# Patient Record
Sex: Male | Born: 1975 | Race: Black or African American | Hispanic: No | Marital: Single | Smoking: Former smoker
Health system: Southern US, Community
[De-identification: ages and names within clinical notes are randomized; demographics above are authoritative.]

## PROBLEM LIST (undated history)

## (undated) DIAGNOSIS — E119 Type 2 diabetes mellitus without complications: Secondary | ICD-10-CM

## (undated) DIAGNOSIS — I428 Other cardiomyopathies: Secondary | ICD-10-CM

## (undated) DIAGNOSIS — I5042 Chronic combined systolic (congestive) and diastolic (congestive) heart failure: Secondary | ICD-10-CM

## (undated) DIAGNOSIS — Z91148 Patient's other noncompliance with medication regimen for other reason: Secondary | ICD-10-CM

## (undated) DIAGNOSIS — I2510 Atherosclerotic heart disease of native coronary artery without angina pectoris: Secondary | ICD-10-CM

## (undated) DIAGNOSIS — I4892 Unspecified atrial flutter: Secondary | ICD-10-CM

## (undated) DIAGNOSIS — E669 Obesity, unspecified: Secondary | ICD-10-CM

## (undated) DIAGNOSIS — I10 Essential (primary) hypertension: Secondary | ICD-10-CM

## (undated) DIAGNOSIS — E785 Hyperlipidemia, unspecified: Secondary | ICD-10-CM

## (undated) DIAGNOSIS — G4733 Obstructive sleep apnea (adult) (pediatric): Secondary | ICD-10-CM

## (undated) HISTORY — DX: Type 2 diabetes mellitus without complications: E11.9

## (undated) HISTORY — DX: Obstructive sleep apnea (adult) (pediatric): G47.33

---

## 2020-04-07 ENCOUNTER — Encounter (HOSPITAL_COMMUNITY): Payer: Self-pay | Admitting: Emergency Medicine

## 2020-04-07 ENCOUNTER — Inpatient Hospital Stay (HOSPITAL_COMMUNITY)
Admission: EM | Admit: 2020-04-07 | Discharge: 2020-04-14 | DRG: 291 | Disposition: A | Discharge status: Home / Self Care | Payer: Self-pay | Attending: Internal Medicine | Admitting: Internal Medicine

## 2020-04-07 ENCOUNTER — Other Ambulatory Visit: Payer: Self-pay

## 2020-04-07 ENCOUNTER — Emergency Department (HOSPITAL_COMMUNITY): Payer: Self-pay

## 2020-04-07 DIAGNOSIS — F40240 Claustrophobia: Secondary | ICD-10-CM | POA: Diagnosis present

## 2020-04-07 DIAGNOSIS — I5041 Acute combined systolic (congestive) and diastolic (congestive) heart failure: Secondary | ICD-10-CM

## 2020-04-07 DIAGNOSIS — I110 Hypertensive heart disease with heart failure: Principal | ICD-10-CM | POA: Diagnosis present

## 2020-04-07 DIAGNOSIS — T502X5A Adverse effect of carbonic-anhydrase inhibitors, benzothiadiazides and other diuretics, initial encounter: Secondary | ICD-10-CM | POA: Diagnosis not present

## 2020-04-07 DIAGNOSIS — N179 Acute kidney failure, unspecified: Secondary | ICD-10-CM | POA: Diagnosis not present

## 2020-04-07 DIAGNOSIS — I10 Essential (primary) hypertension: Secondary | ICD-10-CM

## 2020-04-07 DIAGNOSIS — R0602 Shortness of breath: Secondary | ICD-10-CM | POA: Diagnosis present

## 2020-04-07 DIAGNOSIS — I493 Ventricular premature depolarization: Secondary | ICD-10-CM | POA: Diagnosis present

## 2020-04-07 DIAGNOSIS — Z6841 Body Mass Index (BMI) 40.0 and over, adult: Secondary | ICD-10-CM

## 2020-04-07 DIAGNOSIS — Z720 Tobacco use: Secondary | ICD-10-CM

## 2020-04-07 DIAGNOSIS — I509 Heart failure, unspecified: Secondary | ICD-10-CM

## 2020-04-07 DIAGNOSIS — J9622 Acute and chronic respiratory failure with hypercapnia: Secondary | ICD-10-CM | POA: Diagnosis present

## 2020-04-07 DIAGNOSIS — Z8616 Personal history of COVID-19: Secondary | ICD-10-CM

## 2020-04-07 DIAGNOSIS — J9601 Acute respiratory failure with hypoxia: Secondary | ICD-10-CM

## 2020-04-07 DIAGNOSIS — I4891 Unspecified atrial fibrillation: Secondary | ICD-10-CM | POA: Diagnosis present

## 2020-04-07 DIAGNOSIS — R600 Localized edema: Secondary | ICD-10-CM

## 2020-04-07 DIAGNOSIS — I4892 Unspecified atrial flutter: Secondary | ICD-10-CM

## 2020-04-07 DIAGNOSIS — F1290 Cannabis use, unspecified, uncomplicated: Secondary | ICD-10-CM

## 2020-04-07 DIAGNOSIS — Z20822 Contact with and (suspected) exposure to covid-19: Secondary | ICD-10-CM | POA: Diagnosis present

## 2020-04-07 DIAGNOSIS — I248 Other forms of acute ischemic heart disease: Secondary | ICD-10-CM | POA: Diagnosis present

## 2020-04-07 DIAGNOSIS — J9621 Acute and chronic respiratory failure with hypoxia: Secondary | ICD-10-CM | POA: Diagnosis present

## 2020-04-07 DIAGNOSIS — D751 Secondary polycythemia: Secondary | ICD-10-CM | POA: Diagnosis present

## 2020-04-07 DIAGNOSIS — I428 Other cardiomyopathies: Secondary | ICD-10-CM | POA: Diagnosis present

## 2020-04-07 DIAGNOSIS — I429 Cardiomyopathy, unspecified: Secondary | ICD-10-CM

## 2020-04-07 DIAGNOSIS — Z8249 Family history of ischemic heart disease and other diseases of the circulatory system: Secondary | ICD-10-CM

## 2020-04-07 DIAGNOSIS — E118 Type 2 diabetes mellitus with unspecified complications: Secondary | ICD-10-CM

## 2020-04-07 DIAGNOSIS — I5043 Acute on chronic combined systolic (congestive) and diastolic (congestive) heart failure: Secondary | ICD-10-CM | POA: Diagnosis present

## 2020-04-07 DIAGNOSIS — R601 Generalized edema: Secondary | ICD-10-CM

## 2020-04-07 DIAGNOSIS — R0600 Dyspnea, unspecified: Secondary | ICD-10-CM | POA: Diagnosis present

## 2020-04-07 DIAGNOSIS — G4733 Obstructive sleep apnea (adult) (pediatric): Secondary | ICD-10-CM | POA: Diagnosis present

## 2020-04-07 DIAGNOSIS — F17210 Nicotine dependence, cigarettes, uncomplicated: Secondary | ICD-10-CM

## 2020-04-07 HISTORY — DX: Essential (primary) hypertension: I10

## 2020-04-07 LAB — BASIC METABOLIC PANEL WITH GFR
Anion gap: 10 (ref 5–15)
BUN: 15 mg/dL (ref 6–20)
CO2: 28 mmol/L (ref 22–32)
Calcium: 9 mg/dL (ref 8.9–10.3)
Chloride: 102 mmol/L (ref 98–111)
Creatinine, Ser: 1.27 mg/dL — ABNORMAL HIGH (ref 0.61–1.24)
GFR, Estimated: >60 mL/min
Glucose, Bld: 129 mg/dL — ABNORMAL HIGH (ref 70–99)
Potassium: 4.2 mmol/L (ref 3.5–5.1)
Sodium: 140 mmol/L (ref 135–145)

## 2020-04-07 LAB — LIPID PANEL
Cholesterol: 139 mg/dL (ref 0–200)
HDL: 35 mg/dL — ABNORMAL LOW (ref >40)
LDL Cholesterol: 93 mg/dL (ref 0–99)
Total CHOL/HDL Ratio: 4 RATIO
Triglycerides: 57 mg/dL (ref <150)
VLDL: 11 mg/dL (ref 0–40)

## 2020-04-07 LAB — CBC
HCT: 53.1 % — ABNORMAL HIGH (ref 39.0–52.0)
Hemoglobin: 16 g/dL (ref 13.0–17.0)
MCH: 27.7 pg (ref 26.0–34.0)
MCHC: 30.1 g/dL (ref 30.0–36.0)
MCV: 92 fL (ref 80.0–100.0)
Platelets: 156 10*3/uL (ref 150–400)
RBC: 5.77 MIL/uL (ref 4.22–5.81)
RDW: 18.6 % — ABNORMAL HIGH (ref 11.5–15.5)
WBC: 4.1 10*3/uL (ref 4.0–10.5)
nRBC: 0 % (ref 0.0–0.2)

## 2020-04-07 LAB — TROPONIN I (HIGH SENSITIVITY)
Troponin I (High Sensitivity): 55 ng/L — ABNORMAL HIGH
Troponin I (High Sensitivity): 60 ng/L — ABNORMAL HIGH
Troponin I (High Sensitivity): 60 ng/L — ABNORMAL HIGH (ref <18)

## 2020-04-07 LAB — HEPATIC FUNCTION PANEL
ALT: 41 U/L (ref 0–44)
AST: 23 U/L (ref 15–41)
Albumin: 3.2 g/dL — ABNORMAL LOW (ref 3.5–5.0)
Alkaline Phosphatase: 81 U/L (ref 38–126)
Bilirubin, Direct: 0.2 mg/dL (ref 0.0–0.2)
Indirect Bilirubin: 0.6 mg/dL (ref 0.3–0.9)
Total Bilirubin: 0.8 mg/dL (ref 0.3–1.2)
Total Protein: 6.3 g/dL — ABNORMAL LOW (ref 6.5–8.1)

## 2020-04-07 LAB — HIV ANTIBODY (ROUTINE TESTING W REFLEX): HIV Screen 4th Generation wRfx: NONREACTIVE

## 2020-04-07 LAB — POC SARS CORONAVIRUS 2 AG -  ED: SARS Coronavirus 2 Ag: NEGATIVE

## 2020-04-07 LAB — BRAIN NATRIURETIC PEPTIDE: B Natriuretic Peptide: 878.9 pg/mL — ABNORMAL HIGH (ref 0.0–100.0)

## 2020-04-07 LAB — HEMOGLOBIN A1C
Hgb A1c MFr Bld: 6.7 % — ABNORMAL HIGH (ref 4.8–5.6)
Mean Plasma Glucose: 145.59 mg/dL

## 2020-04-07 LAB — TSH: TSH: 2.779 u[IU]/mL (ref 0.350–4.500)

## 2020-04-07 LAB — MAGNESIUM: Magnesium: 1.7 mg/dL (ref 1.7–2.4)

## 2020-04-07 MED ORDER — FUROSEMIDE 10 MG/ML IJ SOLN
20.0000 mg | Freq: Once | INTRAMUSCULAR | Status: AC
  Administered 2020-04-07: 20 mg via INTRAVENOUS
  Filled 2020-04-07: qty 2

## 2020-04-07 MED ORDER — MAGNESIUM SULFATE 50 % IJ SOLN: 1.0000 g | Freq: Once | INTRAMUSCULAR | Status: DC

## 2020-04-07 MED ORDER — NICOTINE 14 MG/24HR TD PT24
14.0000 mg | MEDICATED_PATCH | Freq: Every day | TRANSDERMAL | Status: DC
  Administered 2020-04-07 – 2020-04-14 (×8): 14 mg via TRANSDERMAL
  Filled 2020-04-07 (×8): qty 1

## 2020-04-07 MED ORDER — MAGNESIUM SULFATE IN D5W 1-5 GM/100ML-% IV SOLN
1.0000 g | Freq: Once | INTRAVENOUS | Status: AC
  Administered 2020-04-07: 1 g via INTRAVENOUS
  Filled 2020-04-07: qty 100

## 2020-04-07 MED ORDER — POLYETHYLENE GLYCOL 3350 17 G PO PACK
17.0000 g | PACK | Freq: Every day | ORAL | Status: DC | PRN
  Filled 2020-04-07: qty 1

## 2020-04-07 MED ORDER — ACETAMINOPHEN 650 MG RE SUPP: 650.0000 mg | Freq: Four times a day (QID) | RECTAL | Status: DC | PRN

## 2020-04-07 MED ORDER — ENOXAPARIN SODIUM 60 MG/0.6ML ~~LOC~~ SOLN
55.0000 mg | SUBCUTANEOUS | Status: DC
  Administered 2020-04-07 – 2020-04-10 (×4): 55 mg via SUBCUTANEOUS
  Filled 2020-04-07: qty 0.55
  Filled 2020-04-07 (×3): qty 0.6
  Filled 2020-04-07: qty 0.55

## 2020-04-07 MED ORDER — ACETAMINOPHEN 325 MG PO TABS
650.0000 mg | ORAL_TABLET | Freq: Four times a day (QID) | ORAL | Status: DC | PRN
  Administered 2020-04-11 – 2020-04-12 (×2): 650 mg via ORAL
  Filled 2020-04-07 (×2): qty 2

## 2020-04-07 NOTE — ED Notes (Signed)
Once pt was roomed in 15 his sats were between 88-93%. Pt was put on 2 lpm via  with RN approval and PA was notified.

## 2020-04-07 NOTE — ED Triage Notes (Signed)
Pt reports SOB and lower extremity edema x 2 days.  Denies pain.

## 2020-04-07 NOTE — ED Notes (Signed)
Lab stated only blood sample were received after 5 this afternoon, noted initial protocol lab orders in process at this time.

## 2020-04-07 NOTE — H&P (Signed)
Date: 04/07/2020               Patient Name:  Gregory Mckenzie MRN: [id number]  DOB: 04-Jun-1975 Age / Sex: 45 y.o., male   PCP: Pcp, No         Medical Service: Internal Medicine Teaching Service         Attending Physician: Dr. Reymundo Poll    First Contact: Dr. Evlyn Kanner Pager: [phone]  Second Contact: Dr. Elige Radon Pager: [phone]       After Hours (After 5p/  First Contact Pager: [phone]  weekends / holidays): Second Contact Pager: [phone]   Chief Complaint: Shortness of Breath   History of Present Illness:   Gregory Mckenzie is a 45 year old man with no known past medical history because he has not seen a doctor in recent years who presents to the ED with shortness of breath. He reports he was in his usual state of health until two days ago when he noticed that he his shoes were fitting tighter than usual, and the swelling extended up to his groin within a day. Reports for many years he has experienced nighttime awakenings with accompanying shortness of breath, daytime sleepiness, and frequent daytime naps. Sleeps on one pillow but often falls asleep in his recliner while playing video games. Denies headaches, chest pain, dizziness, lightheadedness, palpitations, weakness, abdominal pain, dysuria, nausea, vomiting, diarrhea, polyuria, polydipsia, dry skin, recent fevers or chills. Reports since receiving Lasix in the ED prior to our evaluation he has noticed slight improvement in his breathing and lower extremity swelling. He reports two months ago he noticed shortness of breath when walking the short distance from his house to his car. Assuming it was related to his weight, he has since made changes to improve his diet including boiling instead of frying foods and cutting back on salt, soda, and sweet tea. Notes that last month he weighed himself on a home scale and was 250 pounds. He has not seen a doctor as an adult and does not take any medications aside from the occasional  acetaminophen for pain. Vaccinated and boosted against COVID.  ED Course: Afebrile, intermittently tachypneic with RR 16-27, P 90s-102, hypertensive with BP 140s-170s/80s-110s, placed on 2 L  for SpO2 in low 90s with improvement to >94%. CBC unremarkable. BMP notable for Na 140, K 4.2 (slight hemolysis), glucose 129, BUN/Cr 15/1.27 (unknown baseline). Troponin 55->60. BNP elevated at 878.9. EKG with tachycardia and occasional PVCs. CXR with cardiomegaly with central vascular prominence and bibasilar interstitial opacities c/w pulmonary edema. Given 20 mg IV Lasix. IMTS consulted for admission.   Meds:  Currently does not take any medications.  Allergies: Allergies as of 04/07/2020  . (No Known Allergies)   Family History: Mother and father are living; mother has hypertension. Brother passed away at 36 years old from a blood clot and heart failure. No known family history of diabetes or cancer.   Social History: Lives with his fiance in [place]. 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 Has been smoking 0.5 ppd for the last 13 years (6.5 pack-year smoking history). Daily marijuana use. Occasional social alcohol drinking. No cocaine or heroin use. Diet mainly consists of burgers, chicken, "a lot of friend foods," sweet drinks.   PMH: Had COVID-19 last year (2021).   Review of Systems: A complete ROS was negative except as per HPI.   Physical Exam: Blood pressure (!) 170/94, pulse 97, temperature 97.9 F (36.6 C), temperature source Oral, resp.  rate (!) 22, height 5\' 4"  (1.626 m), weight 113.4 kg, SpO2 94 %. Constitutional: very pleasant, obese, and tired-appearing man sitting up in ED stretcher, in no acute distress HENT: normocephalic atraumatic, mucous membranes moist, macroglossia Eyes: conjunctiva slightly erythematous, no discharge Neck: supple, increased neck circumference Cardiovascular: tachycardic, regular rhythm, no m/r/g; unable to assess JVD secondary to body  habitus; 2-3+ pitting edema of the lower extremities bilaterally up to the knees Pulmonary/Chest: increased work of breathing on 3L [place] with SpO2 >92%, lungs with crackles bilaterally to the mid lung fields, no wheezes or rhonchi; upper airway sounds Abdominal: mildly tense, non-tender, mildly distended MSK: normal bulk and tone Neurological: alert & oriented x 3, moves all extremities equally, sensation grossly intact Skin: warm and dry Psych: normal mood and affect  EKG: personally reviewed my interpretation is tachycardia with right atrial enlargement and occasional PVCs.  CXR: personally reviewed my interpretation is cardiomegaly with central vascular prominence and bibasilar interstitial opacities.  Assessment & Plan by Problem: Active Problems:   Shortness of breath  Gregory Mckenzie is a 45 year old man with daily tobacco use and no known past medical history because he has not seen a doctor in recent years who presents to the ED with shortness of breath and lower extremity edema and admitted for further evaluation and management of suspected new onset heart failure.  Acute hypoxic respiratory failure secondary to likely New Onset Heart Failure Elevated blood pressure Presents with acute-onset of lower extremity edema associated with dyspnea and new oxygen requirement (3 L). Mildly tachypneic and tachycardic. Hypertensive with BP 140s-170s/80s-110s. Exam notable for bilateral lower extremity pitting edema, bilateral crackles to the mid-lung fields, no wheezing. BNP 878.9. Troponin 55->60. CXR consistent with pulmonary edema. Presentation concerning for new-onset heart failure with associated demand ischemia. Risk factors include possible sleep apnea (see below), possible long-standing untreated hypertension, family history. Will obtain echocardiogram, diurese, evaluate for additional risk factors and treat accordingly. - s/p 20 mg IV Lasix, additional 20 mg IV Lasix tonight for total of 40 mg IV   - Echocardiogram - Hgb A1c - TSH - Lipid Panel  - Trend troponin - Magnesium  - Strict I/O, daily weights - Wean oxygen as tolerated - Monitor electrolytes, replete K>4 and Mg>2 - Trend BMP - Telemetry  - HH diet with 1.5L fluid restriction  Possible Sleep Apnea:  Patient with increased waist circumference, BMI >40, daily somnolence with frequent unintentional napping, and elevated blood pressure. Patient's partner reports patient occasionally gasping at night. High suspicion for sleep apnea. - Outpatient polysomnography  Daily Tobacco Use Current half-pack per day smoker with 6.5 pack-year smoking history. - Nicotine patch 14mg  - Smoking cessation counseling  Diet: Heart Healthy with 1500 L fluid restriction VTE: Enoxaparin IVF: None Code: Full  Prior to Admission Living Arrangement: Home, living with his fiance Anticipated Discharge Location: Home Barriers to Discharge: further evaluation and management of suspected new-onset heart failure  Dispo: Admit patient to Inpatient with expected length of stay greater than 2 midnights.   Signed: 47, MD PGY-1 Internal Medicine Teaching Service Pager: [phone] 04/07/2020 After 5pm on weekdays and 1pm on weekends: On Call pager: [phone]

## 2020-04-07 NOTE — ED Provider Notes (Signed)
[facility] EMERGENCY DEPARTMENT Provider Note   CSN: [id number] Arrival date & time: 04/07/20  1136     History Chief Complaint  Patient presents with   Shortness of Breath    Gregory Mckenzie is a 45 y.o. male who presents for evaluation of shortness of breath, lower extremity edema that began yesterday.  He states that he started noticing it in his feet.  He states that it would hurt him to put on his normal shoes and so he would have to wear flip-flops.  He states that yesterday, he started noticing that it began spreading up his legs.  He states that by the end of yesterday, he was swollen from his legs up to his scrotum.  He states that this is never happened to him before and is new.  He reports that he is also had some shortness of breath.  He states that at baseline, he has some minor shortness of breath but states that he feels like it is gotten slightly worse over the last few days.  No cough, chest pain.  He sleeps in a chair normally so he does not know of the change.  He states that he has not had any fevers, cough, abdominal pain, nausea/vomiting.  He smokes cigarettes and reports that a pack will last about 2 days.  He also endorses occasional marijuana use.  No cocaine use.  He states he has never seen a primary care doctor and does not take any medications on a daily basis.  The history is provided by the patient.       History reviewed. No pertinent past medical history.  There are no problems to display for this patient.   History reviewed. No pertinent surgical history.     No family history on file.  Social History   Tobacco Use   Smoking status: Current Every Day Smoker   Smokeless tobacco: Never Used  Substance Use Topics   Alcohol use: Not Currently   Drug use: Not Currently    Home Medications Prior to Admission medications   Not on File    Allergies    Patient has no known allergies.  Review of Systems   Review of Systems   Constitutional: Negative for fever.  Respiratory: Positive for shortness of breath. Negative for cough.   Cardiovascular: Positive for leg swelling. Negative for chest pain.  Gastrointestinal: Negative for abdominal pain, nausea and vomiting.  Genitourinary: Positive for scrotal swelling. Negative for dysuria and hematuria.  Neurological: Negative for headaches.  All other systems reviewed and are negative.   Physical Exam Updated Vital Signs BP (!) 150/69    Pulse 99    Temp 97.9 F (36.6 C) (Oral)    Resp 15    Ht 5\' 4"  (1.626 m)    Wt 113.4 kg    SpO2 98%    BMI 42.91 kg/m   Physical Exam Vitals and nursing note reviewed.  Constitutional:      Appearance: Normal appearance. He is well-developed and well-nourished.  HENT:     Head: Normocephalic and atraumatic.     Mouth/Throat:     Mouth: Oropharynx is clear and moist and mucous membranes are normal.  Eyes:     General: Lids are normal.     Extraocular Movements: EOM normal.     Conjunctiva/sclera: Conjunctivae normal.     Pupils: Pupils are equal, round, and reactive to light.  Cardiovascular:     Rate and Rhythm: Normal rate and regular  rhythm.     Pulses: Normal pulses.     Heart sounds: Normal heart sounds. No murmur heard. No friction rub. No gallop.   Pulmonary:     Effort: Pulmonary effort is normal. Tachypnea present.     Breath sounds: Rales present.     Comments: Mild tachypnea noted. Rales noted at bilateral bases. Able to speak in full sentences.  Abdominal:     Palpations: Abdomen is soft. Abdomen is not rigid.     Tenderness: There is no abdominal tenderness. There is no guarding.  Genitourinary:    Comments: The exam was performed with a chaperone present Arlyss Repress, Tech). Normal male genitalia. No evidence of rash, ulcers or lesions.  Diffuse scrotal edema.  No overlying warmth, erythema.  No tenderness noted. Musculoskeletal:        General: Normal range of motion.     Cervical back: Full passive range  of motion without pain.     Comments: 2+ pitting edema noted from his feet that extends up to the distal thigh.  1+ pitting edema noted from the thigh up to the scrotum.  Skin:    General: Skin is warm and dry.     Capillary Refill: Capillary refill takes less than 2 seconds.  Neurological:     Mental Status: He is alert and oriented to person, place, and time.  Psychiatric:        Mood and Affect: Mood and affect normal.        Speech: Speech normal.     ED Results / Procedures / Treatments   Labs (all labs ordered are listed, but only abnormal results are displayed) Labs Reviewed  BASIC METABOLIC PANEL - Abnormal; Notable for the following components:      Result Value   Glucose, Bld 129 (*)    Creatinine, Ser 1.27 (*)    All other components within normal limits  CBC - Abnormal; Notable for the following components:   HCT 53.1 (*)    RDW 18.6 (*)    All other components within normal limits  BRAIN NATRIURETIC PEPTIDE - Abnormal; Notable for the following components:   B Natriuretic Peptide 878.9 (*)    All other components within normal limits  TROPONIN I (HIGH SENSITIVITY) - Abnormal; Notable for the following components:   Troponin I (High Sensitivity) 55 (*)    All other components within normal limits  POC SARS CORONAVIRUS 2 AG -  ED  TROPONIN I (HIGH SENSITIVITY)  TROPONIN I (HIGH SENSITIVITY)    EKG EKG Interpretation  Date/Time:  Tuesday April 07 2020 11:45:12 EST Ventricular Rate:  102 PR Interval:  186 QRS Duration: 90 QT Interval:  348 QTC Calculation: 453 R Axis:   104 Text Interpretation: Sinus tachycardia with occasional Premature ventricular complexes Right atrial enlargement Lateral infarct , age undetermined Abnormal ECG No previous tracing Confirmed by Tilden Fossa [phone]) on 04/07/2020 4:37:13 PM   Radiology DG Chest 2 View  Result Date: 04/07/2020 CLINICAL DATA:  Chest pain and shortness of breath EXAM: CHEST - 2 VIEW COMPARISON:  None.  FINDINGS: Cardiomegaly with cardiomegaly with central vascular prominence. Bibasilar interstitial opacities. No pleural effusion or pneumothorax. The visualized skeletal structures are unremarkable. IMPRESSION: Cardiomegaly with central vascular prominence and bibasilar interstitial opacities, likely pulmonary edema. Electronically Signed   By: Maudry Mayhew MD   On: 04/07/2020 12:55    Procedures Procedures   Medications Ordered in ED Medications  furosemide (LASIX) injection 20 mg (has no administration in time range)  ED Course  I have reviewed the triage vital signs and the nursing notes.  Pertinent labs & imaging results that were available during my care of the patient were reviewed by me and considered in my medical decision making (see chart for details).    MDM Rules/Calculators/A&P                          45 year old male who presents for evaluation of bilateral lower extremity edema, shortness of breath.  States he noticed it yesterday and he feels like it is gotten worse.  He states he always has some shortness of breath but feels like it is gotten slightly worse.  No fevers, cough, chest pain.  He reports to me that he has never seen a primary care doctor.  On initial ED arrival, he is afebrile, appears slightly uncomfortable.  He is hypertensive and tachypneic.  Additionally, when he was placed back here in the room, his O2 sat read 88%.  He required 2 L of oxygen for him back up to about 94.  On exam, he has edema noted to bilateral lower extremities up to his scrotum consistent with fluid overload.  Additionally, some mild rales noted in the bases.  Concern for new onset heart failure.  He is hypertensive here.  I have no records of any medications from previous records of his blood pressure.  Question if this is new onset heart failure from uncontrolled hypertension.  Labs, chest x-ray ordered at triage.  CXR shows cardiomegaly with central vascular prominence and bibasilar  interstitial opacities, likely pulmonary edema.   Covid is negative.  His initial troponin is 55.  BNP is elevated at 878.9.  CBC stable.  BMP shows BUN of 15, creatinine 1.27.  I suspect troponin is elevated from demand ischemia from new onset heart failure.  He has been hypertensive throughout his entire ED stay here.  I suspect probably that his blood pressure has been uncontrolled for a while and that this is caused an acute heart failure.  At this time, he has an oxygen requirement of 2 L as well as new onset heart failure that needs echo and further work-up.  Patient given Lasix.  Discussed with patient he is agreeable for admission.  Discussed with internal medicine who accepts patient for admission.   Portions of this note were generated with Scientist, clinical (histocompatibility and immunogenetics). Dictation errors may occur despite best attempts at proofreading.   Final Clinical Impression(s) / ED Diagnoses Final diagnoses:  Hypertension, unspecified type  Bilateral lower extremity edema  Shortness of breath    Rx / DC Orders ED Discharge Orders    None       Rosana Hoes 04/07/20 Cristopher Estimable, MD 04/07/20 2225

## 2020-04-08 ENCOUNTER — Encounter (HOSPITAL_COMMUNITY): Payer: Self-pay | Admitting: Internal Medicine

## 2020-04-08 ENCOUNTER — Inpatient Hospital Stay (HOSPITAL_COMMUNITY): Payer: Self-pay

## 2020-04-08 DIAGNOSIS — I5041 Acute combined systolic (congestive) and diastolic (congestive) heart failure: Secondary | ICD-10-CM

## 2020-04-08 DIAGNOSIS — R0600 Dyspnea, unspecified: Secondary | ICD-10-CM

## 2020-04-08 DIAGNOSIS — I4892 Unspecified atrial flutter: Secondary | ICD-10-CM

## 2020-04-08 DIAGNOSIS — R601 Generalized edema: Secondary | ICD-10-CM

## 2020-04-08 DIAGNOSIS — J9601 Acute respiratory failure with hypoxia: Secondary | ICD-10-CM

## 2020-04-08 LAB — URINALYSIS, ROUTINE W REFLEX MICROSCOPIC
Bacteria, UA: NONE SEEN
Bilirubin Urine: NEGATIVE
Glucose, UA: NEGATIVE mg/dL
Ketones, ur: NEGATIVE mg/dL
Leukocytes,Ua: NEGATIVE
Nitrite: NEGATIVE
Protein, ur: NEGATIVE mg/dL
Specific Gravity, Urine: 1.005 (ref 1.005–1.030)
pH: 7 (ref 5.0–8.0)

## 2020-04-08 LAB — ECHOCARDIOGRAM COMPLETE
Area-P 1/2: 14.31 cm2
Height: 64 in
S' Lateral: 4.5 cm
Weight: 4000 oz

## 2020-04-08 LAB — BASIC METABOLIC PANEL
Anion gap: 11 (ref 5–15)
BUN: 12 mg/dL (ref 6–20)
CO2: 32 mmol/L (ref 22–32)
Calcium: 9.3 mg/dL (ref 8.9–10.3)
Chloride: 98 mmol/L (ref 98–111)
Creatinine, Ser: 1.34 mg/dL — ABNORMAL HIGH (ref 0.61–1.24)
GFR, Estimated: >60 mL/min (ref >60)
Glucose, Bld: 117 mg/dL — ABNORMAL HIGH (ref 70–99)
Potassium: 4.2 mmol/L (ref 3.5–5.1)
Sodium: 141 mmol/L (ref 135–145)

## 2020-04-08 LAB — GLUCOSE, CAPILLARY
Glucose-Capillary: 131 mg/dL — ABNORMAL HIGH (ref 70–99)
Glucose-Capillary: 128 mg/dL — ABNORMAL HIGH (ref 70–99)

## 2020-04-08 LAB — CBG MONITORING, ED: Glucose-Capillary: 119 mg/dL — ABNORMAL HIGH (ref 70–99)

## 2020-04-08 LAB — MAGNESIUM: Magnesium: 2.4 mg/dL (ref 1.7–2.4)

## 2020-04-08 MED ORDER — FUROSEMIDE 10 MG/ML IJ SOLN
40.0000 mg | Freq: Once | INTRAMUSCULAR | Status: AC
  Administered 2020-04-08: 40 mg via INTRAVENOUS
  Filled 2020-04-08: qty 4

## 2020-04-08 MED ORDER — INSULIN ASPART 100 UNIT/ML ~~LOC~~ SOLN
0.0000 [IU] | Freq: Three times a day (TID) | SUBCUTANEOUS | Status: DC
  Administered 2020-04-08 – 2020-04-09 (×3): 2 [IU] via SUBCUTANEOUS
  Administered 2020-04-10: 5 [IU] via SUBCUTANEOUS
  Administered 2020-04-10 – 2020-04-11 (×2): 3 [IU] via SUBCUTANEOUS
  Administered 2020-04-11: 2 [IU] via SUBCUTANEOUS
  Administered 2020-04-12: 3 [IU] via SUBCUTANEOUS
  Administered 2020-04-12 – 2020-04-13 (×3): 2 [IU] via SUBCUTANEOUS
  Administered 2020-04-13: 3 [IU] via SUBCUTANEOUS
  Administered 2020-04-13 – 2020-04-14 (×3): 2 [IU] via SUBCUTANEOUS

## 2020-04-08 MED ORDER — CARVEDILOL 6.25 MG PO TABS
6.2500 mg | ORAL_TABLET | Freq: Two times a day (BID) | ORAL | Status: DC
  Administered 2020-04-08 – 2020-04-09 (×2): 6.25 mg via ORAL
  Filled 2020-04-08 (×2): qty 1

## 2020-04-08 MED ORDER — ATORVASTATIN CALCIUM 40 MG PO TABS
40.0000 mg | ORAL_TABLET | Freq: Every day | ORAL | Status: DC
  Administered 2020-04-08 – 2020-04-13 (×6): 40 mg via ORAL
  Filled 2020-04-08 (×6): qty 1

## 2020-04-08 MED ORDER — BUMETANIDE 0.25 MG/ML IJ SOLN
1.0000 mg | Freq: Two times a day (BID) | INTRAMUSCULAR | Status: DC
  Administered 2020-04-09 – 2020-04-11 (×5): 1 mg via INTRAVENOUS
  Filled 2020-04-08 (×9): qty 4

## 2020-04-08 NOTE — ED Notes (Signed)
Pt increased to 4L due to hypoxia  

## 2020-04-08 NOTE — ED Notes (Signed)
Tele Breakfast Order Placed 

## 2020-04-08 NOTE — ED Notes (Signed)
Attempted report X1

## 2020-04-08 NOTE — Progress Notes (Signed)
  Echocardiogram 2D Echocardiogram has been performed.  Pieter Partridge 04/08/2020, 11:30 AM

## 2020-04-08 NOTE — Discharge Summary (Incomplete)
   Name: Lawton Dollinger MRN: [id number] DOB: [birth year] 45 y.o. PCP: Pcp, No  Date of Admission: 04/07/2020 11:40 AM Date of Discharge: Patient is not medically stable for discharge Attending Physician: Reymundo Poll, MD  Discharge Diagnosis: 1. ***  Discharge Medications: Allergies as of 04/08/2020   No Known Allergies   Med Rec must be completed prior to using this [facility]***       Disposition and follow-up:   Mr.Kevin Schnitzler was discharged from [facility] in {DISCHARGE CONDITION:19696} condition.  At the hospital follow up visit please address:  1.  ***  2.  Labs / imaging needed at time of follow-up: ***  3.  Pending labs/ test needing follow-up: ***  Follow-up Appointments:   Hospital Course by problem list: 1. ***  Discharge Exam:   BP (!) 141/99   Pulse 86   Temp 97.9 F (36.6 C) (Oral)   Resp (!) 29   Ht 5\' 4"  (1.626 m)   Wt 113.4 kg   SpO2 90%   BMI 42.91 kg/m  Discharge exam: ***  Pertinent Labs, Studies, and Procedures:  ***  Discharge Instructions:   Signed: , MD 04/08/2020, 7:58 AM   Pager: [phone]

## 2020-04-08 NOTE — Progress Notes (Signed)
Subjective:  Patient evaluated at bedside this AM. Patient reports continued dyspnea, says he has never needed oxygen prior to arrival to ED. Does state he has urinated frequently through the night and feels like his leg swelling has decreased. Of note, reports some abdominal discomfort/cramping after urination, which has only started since lasix was administered. Denies chest pain, palpitations.  Objective:  Vital signs in last 24 hours: Vitals:   04/08/20 0600 04/08/20 0630 04/08/20 0645 04/08/20 0700  BP: (!) 142/99 (!) 118/91  (!) 141/99  Pulse: 98 84 (!) 105 96  Resp:   (!) 36 (!) 24  Temp:      TempSrc:      SpO2:   91%   Weight:      Height:       Physical Exam: General: Obese, sitting up on side of bed, no acute distress CV: Tachycardic, regular rhythm. No m/r/g appreciated. Pulm: Mild bibasilar rales present. No wheezing or rhonchi appreciated. On 4L [place] Abdomen: Soft, non-tender, non-distended MSK: Normal bulk, tone. 2+ pitting edema to knees bilaterally.  CBC Latest Ref Rng & Units 04/07/2020  WBC 4.0 - 10.5 K/uL 4.1  Hemoglobin 13.0 - 17.0 g/dL 46.5  Hematocrit 68.1 - 52.0 % 53.1(H)  Platelets 150 - 400 K/uL 156   BMP Latest Ref Rng & Units 04/08/2020 04/07/2020  Glucose 70 - 99 mg/dL 275(T) 700(F)  BUN 6 - 20 mg/dL 12 15  Creatinine 7.49 - 1.24 mg/dL 4.49(Q) 7.59(F)  Sodium 135 - 145 mmol/L 141 140  Potassium 3.5 - 5.1 mmol/L 4.2 4.2  Chloride 98 - 111 mmol/L 98 102  CO2 22 - 32 mmol/L 32 28  Calcium 8.9 - 10.3 mg/dL 9.3 9.0   Assessment/Plan: [name] is 44yo person with no known medical history admitted 2/22 for acute hypoxic respiratory failure most likely 2/2 acute decompensated heart failure. Patient mildly improved with diuresis, will continue today and obtain imaging for further characterization of symptoms.  Active Problems:   Shortness of breath  #Acute hypoxic respiratory failure most likely 2/2 acute decompensated HF No acute changes  overnight. Diuresing well after IV lasix 40mg  yesterday, recorded UOP 1.9L. Patient's symptoms most consistent with acute decompensated heart failure. He has not previously seen a physician and does not take any medications at home. Likely heart failure d/t long-standing hypertension with also concerns for OSA. Occasional alcohol use. Denies chest pain at rest or with exertion. Troponin normalized overnight, 55>60>60. ECG w/o ischemic changes. Will obtain Echo today, likely will need cardiology consult for ischemic evaluation, especially if reduced EF. Lipid panel unremarkable, TSH normal. - IV lasix 40mg  today - F/u TTE - Daily BMP, Mg - Goal: K >4, Mg >2 - Strict I&O's, daily weights - Outpatient polysomnography  #High blood pressure SBP's since admission 130-160's. Will hold off starting ARB/ACE and BB with elevated sCr (unknown baseline) and acute decompensated HF. Will obtain urine studies today. Plan to follow-up Echo today and begin antihypertensive therapy as indicated. - F/u UA, UMicroalb/UCr - F/u TTE  #Type II diabetes mellitus No previous diagnosis of diabetes. A1c 6.7 on admission, not on any medications. Will have SSI while inpatient, could benefit from SGLT2 inhibitor on discharge. - SSI - CBG monitoring w/ meals  #Tobacco use disorder Daily smoker, will need to counsel for smoking cessation. Patient has multiple cardiovascular risks, including smoking, diabetes, hypertension, obesity, family history.  - Nicotine patch - F/u smoking cessation  DIET: HH IVF:  n/a DVT PPX: Lovenox BOWEL:  Miralax CODE: FULL FAM COM: n/a  Prior to Admission Living Arrangement: Home Anticipated Discharge Location: Home Barriers to Discharge: medical management Dispo: Anticipated discharge in approximately 1-3 day(s).   Evlyn Kanner, MD 04/08/2020, 7:19 AM Pager: [phone] After 5pm on weekdays and 1pm on weekends: On Call pager [phone]

## 2020-04-08 NOTE — Consult Note (Signed)
CARDIOLOGY CONSULT NOTE  Patient ID: Gregory Mckenzie MRN: [id number] DOB/AGE: 07/26/1975 45 y.o.  Admit date: 04/07/2020 Attending physician: Reymundo Poll, MD Primary Physician:  Pcp, No Outpatient Cardiologist: None Inpatient Cardiologist: Tessa Lerner, DO, [facility]  Chief complaint: Shortness of breath and lower extremity swelling  Reason for consultation: New onset of cardiomyopathy Referring physician: Reymundo Poll, MD  HPI:  Gregory Mckenzie is a 45 y.o. African-American male who presents with a chief complaint of " shortness of breath and lower extremity swelling." His past medical history and cardiovascular risk factors include: HTN (not on medication), smoking, obesity, marijuana use, newly diagnosed diabetes mellitus type 2, newly diagnosed congestive heart failure with reduced EF.  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  Patient states that he started noticing that his socks and shoes were tighter than usual, his lower extremity swelling was progressive up to the level of his thighs and also involving the scrotum and therefore was concerned and come to the hospital for further evaluation and management.  He was admitted and being managed by internal medicine service and had an echocardiogram which noted reduced LVEF and therefore cardiology is consulted for further recommendations.  Patient states that he has a history of hypertension but currently not on medical therapy.  He does not have a primary care provider and does not recall the last time he seen a physician.  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  In addition he has gained a significant amount of weight due to dietary indiscretion as he has been consuming fried foods, high salt calorie diet, soda,  and sweet tea.  Patient stated that in the recent past he has experienced shortness of breath with effort related activities but he was attributing that to his underlying obesity.  At the time of the evaluation patient states that his shortness of breath is improving but not resolved.  Similar his lower extremity swelling has improved with IV diuretics but not back to baseline.  He does not have any chest pain at rest or with effort related activities.  No family history of premature coronary artery disease or familial history of cardiomyopathy.  Patient denies any history of viral illnesses.  He has been vaccinated and received his booster for COVID-19.  ALLERGIES: No Known Allergies  PAST MEDICAL HISTORY: Past Medical History:  Diagnosis Date  . Hypertension   Newly diagnosed systolic and diastolic heart failure Newly diagnosed diabetes mellitus  PAST SURGICAL HISTORY: None, per patient  FAMILY HISTORY: No family history of premature coronary artery disease or familial history of cardiomyopathy   SOCIAL HISTORY:  The patient  reports that he has been smoking. He has never used smokeless tobacco. He reports previous alcohol use. He reports previous drug use.  Marijuana use, last used 04/06/2020  MEDICATIONS: No current outpatient medications  REVIEW OF SYSTEMS: Review of Systems  Constitutional: Negative for chills and fever.  HENT: Negative for hoarse voice and nosebleeds.   Eyes: Negative for discharge, double vision and pain.  Cardiovascular: Positive for dyspnea on exertion and leg swelling. Negative for chest pain, claudication, near-syncope, orthopnea, palpitations, paroxysmal nocturnal dyspnea and syncope.  Respiratory: Positive for shortness of breath. Negative for hemoptysis.   Musculoskeletal: Negative for muscle cramps and myalgias.  Gastrointestinal:  Negative for abdominal pain, constipation, diarrhea, hematemesis, hematochezia, melena, nausea and vomiting.   Neurological: Negative for dizziness and light-headedness.  All other systems reviewed and are negative.   PHYSICAL EXAM: Vitals with BMI 04/08/2020 04/08/2020 04/08/2020  Height - - -  Weight - - -  BMI - - -  Systolic 144 141 -  Diastolic 89 102 -  Pulse 100 107 107     Intake/Output Summary (Last 24 hours) at 04/08/2020 2112 Last data filed at 04/08/2020 2054 Gross per 24 hour  Intake --  Output 4320 ml  Net -4320 ml    Net IO Since Admission: -4,320 mL [04/08/20 2112]  CONSTITUTIONAL: Appears older than stated age, hemodynamically stable, obese, no acute distress.  SKIN: Skin is warm and dry. No rash noted. No cyanosis. No pallor. No jaundice HEAD: Normocephalic and atraumatic.  EYES: No scleral icterus MOUTH/THROAT: Moist oral membranes.  NECK: No JVD present. No thyromegaly noted. No carotid bruits  LYMPHATIC: No visible cervical adenopathy.  CHEST Normal respiratory effort. No intercostal retractions  LUNGS: Decreased breath sounds bilaterally with faint crackles noted.  No stridor. No wheezes. CARDIOVASCULAR: Tachycardic, positive S1-S2, no murmurs rubs or gallops appreciated secondary to tachycardia. ABDOMINAL: Obese, distended, nontender, positive bowel sounds in all 4 quadrants, no apparent ascites.  EXTREMITIES: +2 bilateral peripheral edema  HEMATOLOGIC: No significant bruising NEUROLOGIC: Oriented to person, place, and time. Nonfocal. Normal muscle tone.  PSYCHIATRIC: Normal mood and affect. Normal behavior. Cooperative  RADIOLOGY: DG Chest 2 View  Result Date: 04/07/2020 CLINICAL DATA:  Chest pain and shortness of breath EXAM: CHEST - 2 VIEW COMPARISON:  None. FINDINGS: Cardiomegaly with cardiomegaly with central vascular prominence. Bibasilar interstitial opacities. No pleural effusion or pneumothorax. The visualized skeletal structures are unremarkable. IMPRESSION: Cardiomegaly with central vascular prominence and bibasilar interstitial opacities, likely  pulmonary edema. Electronically Signed   By: Maudry Mayhew MD   On: 04/07/2020 12:55   ECHOCARDIOGRAM COMPLETE  Result Date: 04/08/2020    ECHOCARDIOGRAM REPORT   Patient Name:   SAMYAK SACKMANN Date of Exam: 04/08/2020 Medical Rec #:  [id number]   Height:       64.0 in Accession #:    [id number]  Weight:       250.0 lb Date of Birth:  27-Feb-1975   BSA:          2.151 m Patient Age:    44 years    BP:           145/90 mmHg Patient Gender: M           HR:           103 bpm. Exam Location:  Inpatient Procedure: 2D Echo, Cardiac Doppler and Color Doppler Indications:    Dyspnea  History:        Patient has no prior history of Echocardiogram examinations.                 Signs/Symptoms:Dyspnea and LE edema; Risk Factors:Current Smoker                 and Obesity. Resp. failure.  Sonographer:    Lavenia Atlas Referring Phys: [phone] ELIZABETH REES IMPRESSIONS  1. Left ventricular ejection fraction, by estimation, is 40 to 45%. The left ventricle has mildly decreased function. The left ventricle demonstrates global hypokinesis. There is severe concentric left ventricular hypertrophy. Left ventricular diastolic  parameters are consistent with Grade II diastolic dysfunction (pseudonormalization). Elevated left atrial pressure.  2. Right ventricular systolic function was  not well visualized. The right ventricular size is mildly enlarged. There is normal pulmonary artery systolic pressure.  3. Left atrial size was severely dilated.  4. Right atrial size was severely dilated.  5. The mitral valve is normal in structure. Trivial mitral valve regurgitation. No evidence of mitral stenosis.  6. The aortic valve is normal in structure. Aortic valve regurgitation is not visualized. No aortic stenosis is present.  7. The inferior vena cava is dilated in size with >50% respiratory variability, suggesting right atrial pressure of 8 mmHg. FINDINGS  Left Ventricle: Left ventricular ejection fraction, by estimation, is 40 to 45%. The left  ventricle has mildly decreased function. The left ventricle demonstrates global hypokinesis. The left ventricular internal cavity size was normal in size. There is  severe concentric left ventricular hypertrophy. Left ventricular diastolic parameters are consistent with Grade II diastolic dysfunction (pseudonormalization). Elevated left atrial pressure. Right Ventricle: The right ventricular size is mildly enlarged. No increase in right ventricular wall thickness. Right ventricular systolic function was not well visualized. There is normal pulmonary artery systolic pressure. The tricuspid regurgitant velocity is 2.16 m/s, and with an assumed right atrial pressure of 8 mmHg, the estimated right ventricular systolic pressure is 26.7 mmHg. Left Atrium: Left atrial size was severely dilated. Right Atrium: Right atrial size was severely dilated. Pericardium: There is no evidence of pericardial effusion. Mitral Valve: The mitral valve is normal in structure. Trivial mitral valve regurgitation. No evidence of mitral valve stenosis. Tricuspid Valve: The tricuspid valve is normal in structure. Tricuspid valve regurgitation is trivial. No evidence of tricuspid stenosis. Aortic Valve: The aortic valve is normal in structure. Aortic valve regurgitation is not visualized. No aortic stenosis is present. Pulmonic Valve: The pulmonic valve was normal in structure. Pulmonic valve regurgitation is not visualized. No evidence of pulmonic stenosis. Aorta: The aortic root is normal in size and structure. Venous: The inferior vena cava is dilated in size with greater than 50% respiratory variability, suggesting right atrial pressure of 8 mmHg. IAS/Shunts: No atrial level shunt detected by color flow Doppler.  LEFT VENTRICLE PLAX 2D LVIDd:         5.40 cm  Diastology LVIDs:         4.50 cm  LV e' medial:    6.20 cm/s LV PW:         1.70 cm  LV E/e' medial:  13.5 LV IVS:        1.80 cm  LV e' lateral:   7.18 cm/s LVOT diam:     2.10 cm  LV  E/e' lateral: 11.7 LV SV:         45 LV SV Index:   21 LVOT Area:     3.46 cm  RIGHT VENTRICLE RV Basal diam:  4.40 cm RV S prime:     10.80 cm/s TAPSE (M-mode): 3.0 cm LEFT ATRIUM              Index       RIGHT ATRIUM           Index LA diam:        4.80 cm  2.23 cm/m  RA Area:     28.20 cm LA Vol (A2C):   116.0 ml 53.93 ml/m RA Volume:   105.00 ml 48.81 ml/m LA Vol (A4C):   111.0 ml 51.60 ml/m LA Biplane Vol: 114.0 ml 53.00 ml/m  AORTIC VALVE LVOT Vmax:   92.00 cm/s LVOT Vmean:  65.900 cm/s LVOT VTI:  0.131 m  AORTA Ao Root diam: 2.90 cm MITRAL VALVE                TRICUSPID VALVE MV Area (PHT): 14.31 cm    TR Peak grad:   18.7 mmHg MV Decel Time: 53 msec      TR Vmax:        216.00 cm/s MV E velocity: 84.00 cm/s MV A velocity: 101.00 cm/s  SHUNTS MV E/A ratio:  0.83         Systemic VTI:  0.13 m                             Systemic Diam: 2.10 cm Armanda Magicraci Turner MD Electronically signed by Armanda Magicraci Turner MD Signature Date/Time: 04/08/2020/12:49:42 PM    Final     LABORATORY DATA: Lab Results  Component Value Date   WBC 4.1 04/07/2020   HGB 16.0 04/07/2020   HCT 53.1 (H) 04/07/2020   MCV 92.0 04/07/2020   PLT 156 04/07/2020    Recent Labs  Lab 04/07/20 2041 04/08/20 0338  NA  --  141  K  --  4.2  CL  --  98  CO2  --  32  BUN  --  12  CREATININE  --  1.34*  CALCIUM  --  9.3  PROT 6.3*  --   BILITOT 0.8  --   ALKPHOS 81  --   ALT 41  --   AST 23  --   GLUCOSE  --  117*    Lipid Panel     Component Value Date/Time   CHOL 139 04/07/2020 2041   TRIG 57 04/07/2020 2041   HDL 35 (L) 04/07/2020 2041   CHOLHDL 4.0 04/07/2020 2041   VLDL 11 04/07/2020 2041   LDLCALC 93 04/07/2020 2041    BNP (last 3 results) Recent Labs    04/07/20 1701  BNP 878.9*    HEMOGLOBIN A1C Lab Results  Component Value Date   HGBA1C 6.7 (H) 04/07/2020   MPG 145.59 04/07/2020    Cardiac Panel (last 3 results) Cardiac Panel (last 3 results) Recent Labs    04/07/20 1701 04/07/20 1904  04/07/20 2041  TROPONINIHS 55* 60* 60*   TSH Recent Labs    04/07/20 2041  TSH 2.779      CARDIAC DATABASE: EKG: 04/07/2020: Sinus tachycardia, 102 bpm, right atrial enlargement, poor R wave progression, consider old lateral infarct, without underlying injury pattern, occasional ventricular ectopic beats.  04/08/2020: Sinus tachycardia, 104 bpm, right axis deviation, right atrial enlargement, poor R wave progression, without underlying ischemia or injury pattern.  Echocardiogram: 04/08/2020: 1. Left ventricular ejection fraction, by estimation, is 40 to 45%. The  left ventricle has mildly decreased function. The left ventricle  demonstrates global hypokinesis. There is severe concentric left  ventricular hypertrophy. Left ventricular diastolic  parameters are consistent with Grade II diastolic dysfunction  (pseudonormalization). Elevated left atrial pressure.  2. Right ventricular systolic function was not well visualized. The right  ventricular size is mildly enlarged. There is normal pulmonary artery  systolic pressure.  3. Left atrial size was severely dilated.  4. Right atrial size was severely dilated.  5. The mitral valve is normal in structure. Trivial mitral valve  regurgitation. No evidence of mitral stenosis.  6. The aortic valve is normal in structure. Aortic valve regurgitation is  not visualized. No aortic stenosis is present.  7. The inferior vena cava is dilated  in size with >50% respiratory  variability, suggesting right atrial pressure of 8 mmHg.  IMPRESSION & RECOMMENDATIONS: Dameion Briles is a 45 y.o. African-American male whose past medical history and cardiovascular risk factors include: HTN (not on medication), smoking, obesity, marijuana use, newly diagnosed diabetes mellitus type 2, newly diagnosed congestive heart failure with reduced EF.  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 Marijuana use Obesity due to excess calories  Plan: Cardiology is consulted for newly diagnosed cardiomyopathy/management of congestive heart failure.  Based on the echocardiogram results patient has both systolic and diastolic heart failure.  I suspect is most likely secondary to uncontrolled hypertension that has not been treated for unknown duration.  Echocardiographic notes severe left ventricular hypertrophy with global hypokinesis without mention of regional wall motion abnormalities.  Recommend initiation of guideline directed medical therapy.  Of note, patient states that he does not have  medical insurance and therefore prescription coverage as well as follow-up will be difficult.  I have requested primary team to help facilitate a social work consult to improve his access and prescription coverage once discharged.  Recommend carvedilol 6.25 mg p.o. twice daily.   Bumex 1 mg IV push twice daily with close monitoring of kidney function.  He does have renal impairment no prior serum creatinine levels for comparison. Strict I's and O's and daily weights. Low-salt diet Echocardiogram results reviewed Recommend compression stockings in bilateral lower extremities to help mobilize the swelling. If patient is able to get coverage once discharge would recommend initiation of Entresto and Farxiga prior to discharge or in a stepwise fashion.   Otherwise, would recommend losartan and Aldactone prior to discharge.  Patient understands that he needs to have follow-up as outpatient so the medications can further be uptitrated and lab work can be followed as heart failure medications can affect  renal function and electrolytes such as potassium levels which if uncorrected can cause morbidity mortality.  He verbalizes understanding  Given the new diagnosis of cardiomyopathy we discussed ischemic evaluation prior to discharge.  Ideally patient would be a good candidate for coronary CTA if his ventricular rate is well controlled.  However, patient states that he is claustrophobic to the CT scanner and does not want to proceed with coronary CTA.  Alternatives discussed were stress testing or left heart catheterization.  Pros and cons of each modality discussed with the patient he will like to rethink this prior to making his decision.  We will await his decision.  Newly diagnosed diabetes mellitus: Management per primary team.  Recommend Lipitor 40 mg p.o. nightly.  Educated on the importance of complete smoking and marijuana cessation.  Continue care regarding his chronic comorbid conditions.  We will follow patient with you during his hospitalization.  Thank you for allowing [place] to participate in the care of Mr. Ndiaye.   Total encounter time 88 minutes. *Total Encounter Time as defined by the Centers for Medicare and Medicaid Services includes, in addition to the face-to-face time of a patient visit (documented in the note above) non-face-to-face time: obtaining and reviewing outside history, ordering and reviewing medications, tests or procedures, care coordination (communications with other health care professionals or caregivers) and documentation in the medical record.  Patient's questions  and concerns were addressed to his satisfaction. He voices understanding of the instructions provided during this encounter.   This note was created using a voice recognition software as a result there may be grammatical errors inadvertently enclosed that do not reflect the nature of this encounter. Every attempt is made to correct such errors.  Delilah Shan [facility]  Pager: [phone] Office:  [phone] 04/08/2020, 9:12 PM

## 2020-04-09 ENCOUNTER — Encounter (HOSPITAL_COMMUNITY): Payer: Self-pay | Admitting: Internal Medicine

## 2020-04-09 DIAGNOSIS — F17210 Nicotine dependence, cigarettes, uncomplicated: Secondary | ICD-10-CM

## 2020-04-09 DIAGNOSIS — R600 Localized edema: Secondary | ICD-10-CM

## 2020-04-09 DIAGNOSIS — F1290 Cannabis use, unspecified, uncomplicated: Secondary | ICD-10-CM

## 2020-04-09 DIAGNOSIS — E118 Type 2 diabetes mellitus with unspecified complications: Secondary | ICD-10-CM

## 2020-04-09 DIAGNOSIS — I5041 Acute combined systolic (congestive) and diastolic (congestive) heart failure: Secondary | ICD-10-CM

## 2020-04-09 DIAGNOSIS — I10 Essential (primary) hypertension: Secondary | ICD-10-CM

## 2020-04-09 LAB — CBC
HCT: 56.7 % — ABNORMAL HIGH (ref 39.0–52.0)
Hemoglobin: 17.5 g/dL — ABNORMAL HIGH (ref 13.0–17.0)
MCH: 27.8 pg (ref 26.0–34.0)
MCHC: 30.9 g/dL (ref 30.0–36.0)
MCV: 90.1 fL (ref 80.0–100.0)
Platelets: 149 10*3/uL — ABNORMAL LOW (ref 150–400)
RBC: 6.29 MIL/uL — ABNORMAL HIGH (ref 4.22–5.81)
RDW: 18.3 % — ABNORMAL HIGH (ref 11.5–15.5)
WBC: 5.1 10*3/uL (ref 4.0–10.5)
nRBC: 0 % (ref 0.0–0.2)

## 2020-04-09 LAB — MAGNESIUM: Magnesium: 1.8 mg/dL (ref 1.7–2.4)

## 2020-04-09 LAB — BRAIN NATRIURETIC PEPTIDE: B Natriuretic Peptide: 1576.7 pg/mL — ABNORMAL HIGH (ref 0.0–100.0)

## 2020-04-09 LAB — BASIC METABOLIC PANEL
Anion gap: 9 (ref 5–15)
BUN: 13 mg/dL (ref 6–20)
CO2: 35 mmol/L — ABNORMAL HIGH (ref 22–32)
Calcium: 9 mg/dL (ref 8.9–10.3)
Chloride: 96 mmol/L — ABNORMAL LOW (ref 98–111)
Creatinine, Ser: 1.29 mg/dL — ABNORMAL HIGH (ref 0.61–1.24)
GFR, Estimated: >60 mL/min (ref >60)
Glucose, Bld: 137 mg/dL — ABNORMAL HIGH (ref 70–99)
Potassium: 3.8 mmol/L (ref 3.5–5.1)
Sodium: 140 mmol/L (ref 135–145)

## 2020-04-09 LAB — GLUCOSE, CAPILLARY
Glucose-Capillary: 133 mg/dL — ABNORMAL HIGH (ref 70–99)
Glucose-Capillary: 131 mg/dL — ABNORMAL HIGH (ref 70–99)
Glucose-Capillary: 118 mg/dL — ABNORMAL HIGH (ref 70–99)
Glucose-Capillary: 158 mg/dL — ABNORMAL HIGH (ref 70–99)

## 2020-04-09 LAB — MICROALBUMIN / CREATININE URINE RATIO
Creatinine, Urine: 21.4 mg/dL
Microalb Creat Ratio: 518 mg/g{creat} — ABNORMAL HIGH (ref 0–29)
Microalb, Ur: 110.8 ug/mL — ABNORMAL HIGH

## 2020-04-09 MED ORDER — POTASSIUM CHLORIDE 20 MEQ PO PACK
40.0000 meq | PACK | Freq: Once | ORAL | Status: AC
  Administered 2020-04-09: 40 meq via ORAL
  Filled 2020-04-09: qty 2

## 2020-04-09 MED ORDER — METOLAZONE 2.5 MG PO TABS
2.5000 mg | ORAL_TABLET | Freq: Every day | ORAL | Status: DC
Start: 2020-04-09 — End: 2020-04-11
  Administered 2020-04-09 – 2020-04-10 (×2): 2.5 mg via ORAL
  Filled 2020-04-09 (×2): qty 1

## 2020-04-09 MED ORDER — CARVEDILOL 12.5 MG PO TABS
12.5000 mg | ORAL_TABLET | Freq: Two times a day (BID) | ORAL | Status: DC
  Administered 2020-04-09 – 2020-04-14 (×10): 12.5 mg via ORAL
  Filled 2020-04-09 (×10): qty 1

## 2020-04-09 MED ORDER — MAGNESIUM SULFATE 2 GM/50ML IV SOLN
2.0000 g | Freq: Once | INTRAVENOUS | Status: AC
  Administered 2020-04-09: 2 g via INTRAVENOUS
  Filled 2020-04-09: qty 50

## 2020-04-09 MED ORDER — POTASSIUM CHLORIDE 20 MEQ PO PACK: 40.0000 meq | PACK | Freq: Two times a day (BID) | ORAL | Status: DC

## 2020-04-09 NOTE — Progress Notes (Signed)
Per tele patient HR dropped to 37. Went to room to assess patient, patient was asymptomatic. S/w Dr. Marijo Conception on phone to update.

## 2020-04-09 NOTE — Progress Notes (Signed)
Heart Failure Nurse Navigator Progress Note  PCP: Pcp, No PCP-Cardiologist: [place] (new) Admission Diagnosis: New CHF Admitted from: home with spouse  Presentation:   Gregory Mckenzie presented with BLE edema & SOB. Hasn't been to a Dr. In the past.   ECHO/ LVEF: 40-45%, G2DD  Clinical Course:  Past Medical History:  Diagnosis Date  . Hypertension     Social History   Socioeconomic History  . Marital status: Single    Spouse name: Not on file  . Number of children: Not on file  . Years of education: Not on file  . Highest education level: Not on file  Occupational History  . Not on file  Tobacco Use  . Smoking status: Current Every Day Smoker    Packs/day: 1.00    Years: 27.00    Pack years: 27.00    Types: Cigarettes  . Smokeless tobacco: Never Used  Vaping Use  . Vaping Use: Never used  Substance and Sexual Activity  . Alcohol use: Not Currently  . Drug use: Yes    Frequency: 7.0 times per week    Types: Marijuana    Comment: daily  . Sexual activity: Yes    Partners: Female    Birth control/protection: None  Other Topics Concern  . Not on file  Social History Narrative  . Not on file   Social Determinants of Health   Financial Resource Strain: Low Risk   . Difficulty of Paying Living Expenses: Not very hard  Food Insecurity: No Food Insecurity  . Worried About Programme researcher, broadcasting/film/video in the Last Year: Never true  . Ran Out of Food in the Last Year: Never true  Transportation Needs: No Transportation Needs  . Lack of Transportation (Medical): No  . Lack of Transportation (Non-Medical): No  Physical Activity: Inactive  . Days of Exercise per Week: 0 days  . Minutes of Exercise per Session: 0 min  Stress: Not on file  Social Connections: Not on file    High Risk Criteria for Readmission and/or Poor Patient Outcomes:  Heart failure hospital admissions (last 6 months): 1   No Show rate: N/A  Difficult social situation: yes- no insurance  Demonstrates  medication adherence: N/A  Primary Language: English  Literacy level: able to read/write and comprehend  Barriers of Care:   -no insurance -medication regimen -smoking -education -decrease marijuana use  Considerations/Referrals:   Referral made to Heart Failure Pharmacist Stewardship: yes, appreciated Referral made to Heart & Vascular TOC clinic: will follow and place appt closer to DC.  Items for Follow-up on DC/TOC: -insurance -medication knowledge -med optimization -smoking cessation  Ozella Rocks, RN, BSN Heart Failure Nurse Navigator [phone]

## 2020-04-09 NOTE — Progress Notes (Signed)
Progress Note  Patient Name: Gregory Mckenzie Date of Encounter: 04/09/2020  Attending physician: Reymundo Poll, MD Primary care provider: Pcp, No Primary Cardiologist: NA Consultant:Kasem Mozer Odis Hollingshead, DO  Subjective: Gregory Mckenzie is a 45 y.o. male who was seen and examined at bedside at approximately 843am Continues to have shortness of breath and lower extremity swelling. No chest pain No events overnight.  Objective: Vital Signs in the last 24 hours: Temp:  [98 F (36.7 C)-98.9 F (37.2 C)] 98.9 F (37.2 C) (02/24 0758) Pulse Rate:  [96-100] 96 (02/24 0758) Resp:  [16-22] 16 (02/24 0758) BP: (138-148)/(73-89) 138/73 (02/24 0758) SpO2:  [90 %-98 %] 98 % (02/24 0758) Weight:  [124.2 kg] 124.2 kg (02/24 0755)  Intake/Output:  Intake/Output Summary (Last 24 hours) at 04/09/2020 1311 Last data filed at 04/09/2020 1045 Gross per 24 hour  Intake 0 ml  Output 1295 ml  Net -1295 ml    Net IO Since Admission: -4,995 mL [04/09/20 1311]  Weights:  Filed Weights   04/07/20 1839 04/09/20 0755  Weight: 113.4 kg 124.2 kg    Telemetry: Personally reviewed.  Physical examination: PHYSICAL EXAM: Vitals with BMI 04/09/2020 04/09/2020 04/09/2020  Height - - -  Weight - 273 lbs 14 oz -  BMI - 46.99 -  Systolic 138 - 148  Diastolic 73 - 77  Pulse 96 - 99    CONSTITUTIONAL: Appears older than stated age, hemodynamically stable, obese, no acute distress.  SKIN: Skin is warm and dry. No rash noted. No cyanosis. No pallor. No jaundice HEAD: Normocephalic and atraumatic.  EYES: No scleral icterus MOUTH/THROAT: Moist oral membranes.  NECK: No JVD present. No thyromegaly noted. No carotid bruits  LYMPHATIC: No visible cervical adenopathy.  CHEST Normal respiratory effort. No intercostal retractions  LUNGS: Decreased breath sounds bilaterally with faint crackles noted.  No stridor. No wheezes. CARDIOVASCULAR: Regular,, positive S1-S2, no murmurs rubs or gallops appreciated secondary to  tachycardia. ABDOMINAL: Obese, distended, nontender, positive bowel sounds in all 4 quadrants, no apparent ascites.  EXTREMITIES: +2 bilateral peripheral edema  HEMATOLOGIC: No significant bruising NEUROLOGIC: Oriented to person, place, and time. Nonfocal. Normal muscle tone.  PSYCHIATRIC: Normal mood and affect. Normal behavior. Cooperative   Lab Results: Hematology Recent Labs  Lab 04/07/20 1701 04/09/20 0128  WBC 4.1 5.1  RBC 5.77 6.29*  HGB 16.0 17.5*  HCT 53.1* 56.7*  MCV 92.0 90.1  MCH 27.7 27.8  MCHC 30.1 30.9  RDW 18.6* 18.3*  PLT 156 149*    Chemistry Recent Labs  Lab 04/07/20 1701 04/07/20 2041 04/08/20 0338 04/09/20 0128  NA 140  --  141 140  K 4.2  --  4.2 3.8  CL 102  --  98 96*  CO2 28  --  32 35*  GLUCOSE 129*  --  117* 137*  BUN 15  --  12 13  CREATININE 1.27*  --  1.34* 1.29*  CALCIUM 9.0  --  9.3 9.0  PROT  --  6.3*  --   --   ALBUMIN  --  3.2*  --   --   AST  --  23  --   --   ALT  --  41  --   --   ALKPHOS  --  81  --   --   BILITOT  --  0.8  --   --   GFRNONAA >60  --  >60 >60  ANIONGAP 10  --  11 9     Cardiac Enzymes: Cardiac  Panel (last 3 results) Recent Labs    04/07/20 1701 04/07/20 1904 04/07/20 2041  TROPONINIHS 55* 60* 60*    BNP (last 3 results) Recent Labs    04/07/20 1701 04/09/20 0128  BNP 878.9* 1,576.7*    ProBNP (last 3 results) No results for input(s): PROBNP in the last 8760 hours.   DDimer No results for input(s): DDIMER in the last 168 hours.   Hemoglobin A1c:  Lab Results  Component Value Date   HGBA1C 6.7 (H) 04/07/2020   MPG 145.59 04/07/2020    TSH  Recent Labs    04/07/20 2041  TSH 2.779    Lipid Panel     Component Value Date/Time   CHOL 139 04/07/2020 2041   TRIG 57 04/07/2020 2041   HDL 35 (L) 04/07/2020 2041   CHOLHDL 4.0 04/07/2020 2041   VLDL 11 04/07/2020 2041   LDLCALC 93 04/07/2020 2041    Imaging: ECHOCARDIOGRAM COMPLETE  Result Date: 04/08/2020     ECHOCARDIOGRAM REPORT   Patient Name:   Gregory Mckenzie Date of Exam: 04/08/2020 Medical Rec #:  [id number]   Height:       64.0 in Accession #:    [id number]  Weight:       250.0 lb Date of Birth:  September 26, 1975   BSA:          2.151 m Patient Age:    44 years    BP:           145/90 mmHg Patient Gender: M           HR:           103 bpm. Exam Location:  Inpatient Procedure: 2D Echo, Cardiac Doppler and Color Doppler Indications:    Dyspnea  History:        Patient has no prior history of Echocardiogram examinations.                 Signs/Symptoms:Dyspnea and LE edema; Risk Factors:Current Smoker                 and Obesity. Resp. failure.  Sonographer:    Lavenia Atlas Referring Phys: [phone] ELIZABETH REES IMPRESSIONS  1. Left ventricular ejection fraction, by estimation, is 40 to 45%. The left ventricle has mildly decreased function. The left ventricle demonstrates global hypokinesis. There is severe concentric left ventricular hypertrophy. Left ventricular diastolic  parameters are consistent with Grade II diastolic dysfunction (pseudonormalization). Elevated left atrial pressure.  2. Right ventricular systolic function was not well visualized. The right ventricular size is mildly enlarged. There is normal pulmonary artery systolic pressure.  3. Left atrial size was severely dilated.  4. Right atrial size was severely dilated.  5. The mitral valve is normal in structure. Trivial mitral valve regurgitation. No evidence of mitral stenosis.  6. The aortic valve is normal in structure. Aortic valve regurgitation is not visualized. No aortic stenosis is present.  7. The inferior vena cava is dilated in size with >50% respiratory variability, suggesting right atrial pressure of 8 mmHg. FINDINGS  Left Ventricle: Left ventricular ejection fraction, by estimation, is 40 to 45%. The left ventricle has mildly decreased function. The left ventricle demonstrates global hypokinesis. The left ventricular internal cavity size was  normal in size. There is  severe concentric left ventricular hypertrophy. Left ventricular diastolic parameters are consistent with Grade II diastolic dysfunction (pseudonormalization). Elevated left atrial pressure. Right Ventricle: The right ventricular size is mildly enlarged. No increase in right ventricular wall thickness.  Right ventricular systolic function was not well visualized. There is normal pulmonary artery systolic pressure. The tricuspid regurgitant velocity is 2.16 m/s, and with an assumed right atrial pressure of 8 mmHg, the estimated right ventricular systolic pressure is 26.7 mmHg. Left Atrium: Left atrial size was severely dilated. Right Atrium: Right atrial size was severely dilated. Pericardium: There is no evidence of pericardial effusion. Mitral Valve: The mitral valve is normal in structure. Trivial mitral valve regurgitation. No evidence of mitral valve stenosis. Tricuspid Valve: The tricuspid valve is normal in structure. Tricuspid valve regurgitation is trivial. No evidence of tricuspid stenosis. Aortic Valve: The aortic valve is normal in structure. Aortic valve regurgitation is not visualized. No aortic stenosis is present. Pulmonic Valve: The pulmonic valve was normal in structure. Pulmonic valve regurgitation is not visualized. No evidence of pulmonic stenosis. Aorta: The aortic root is normal in size and structure. Venous: The inferior vena cava is dilated in size with greater than 50% respiratory variability, suggesting right atrial pressure of 8 mmHg. IAS/Shunts: No atrial level shunt detected by color flow Doppler.  LEFT VENTRICLE PLAX 2D LVIDd:         5.40 cm  Diastology LVIDs:         4.50 cm  LV e' medial:    6.20 cm/s LV PW:         1.70 cm  LV E/e' medial:  13.5 LV IVS:        1.80 cm  LV e' lateral:   7.18 cm/s LVOT diam:     2.10 cm  LV E/e' lateral: 11.7 LV SV:         45 LV SV Index:   21 LVOT Area:     3.46 cm  RIGHT VENTRICLE RV Basal diam:  4.40 cm RV S prime:      10.80 cm/s TAPSE (M-mode): 3.0 cm LEFT ATRIUM              Index       RIGHT ATRIUM           Index LA diam:        4.80 cm  2.23 cm/m  RA Area:     28.20 cm LA Vol (A2C):   116.0 ml 53.93 ml/m RA Volume:   105.00 ml 48.81 ml/m LA Vol (A4C):   111.0 ml 51.60 ml/m LA Biplane Vol: 114.0 ml 53.00 ml/m  AORTIC VALVE LVOT Vmax:   92.00 cm/s LVOT Vmean:  65.900 cm/s LVOT VTI:    0.131 m  AORTA Ao Root diam: 2.90 cm MITRAL VALVE                TRICUSPID VALVE MV Area (PHT): 14.31 cm    TR Peak grad:   18.7 mmHg MV Decel Time: 53 msec      TR Vmax:        216.00 cm/s MV E velocity: 84.00 cm/s MV A velocity: 101.00 cm/s  SHUNTS MV E/A ratio:  0.83         Systemic VTI:  0.13 m                             Systemic Diam: 2.10 cm Armanda Magic MD Electronically signed by Armanda Magic MD Signature Date/Time: 04/08/2020/12:49:42 PM    Final     Cardiac database: EKG: 04/07/2020: Sinus tachycardia, 102 bpm, right atrial enlargement, poor R wave progression, consider old lateral infarct, without underlying injury  pattern, occasional ventricular ectopic beats.  04/08/2020: Sinus tachycardia, 104 bpm, right axis deviation, right atrial enlargement, poor R wave progression, without underlying ischemia or injury pattern.  Echocardiogram: 04/08/2020: 1. Left ventricular ejection fraction, by estimation, is 40 to 45%. The  left ventricle has mildly decreased function. The left ventricle  demonstrates global hypokinesis. There is severe concentric left  ventricular hypertrophy. Left ventricular diastolic  parameters are consistent with Grade II diastolic dysfunction  (pseudonormalization). Elevated left atrial pressure.  2. Right ventricular systolic function was not well visualized. The right  ventricular size is mildly enlarged. There is normal pulmonary artery  systolic pressure.  3. Left atrial size was severely dilated.  4. Right atrial size was severely dilated.  5. The mitral valve is normal in  structure. Trivial mitral valve  regurgitation. No evidence of mitral stenosis.  6. The aortic valve is normal in structure. Aortic valve regurgitation is  not visualized. No aortic stenosis is present.  7. The inferior vena cava is dilated in size with >50% respiratory  variability, suggesting right atrial pressure of 8 mmHg.  Scheduled Meds:  atorvastatin  40 mg Oral QHS   bumetanide (BUMEX) IV  1 mg Intravenous Q12H   carvedilol  6.25 mg Oral BID WC   enoxaparin (LOVENOX) injection  55 mg Subcutaneous Q24H   insulin aspart  0-15 Units Subcutaneous TID WC   nicotine  14 mg Transdermal Daily    Continuous Infusions:   PRN Meds: acetaminophen **OR** acetaminophen, polyethylene glycol   IMPRESSION & RECOMMENDATIONS: Devyon Keator is a 45 y.o. male whose past medical history and cardiac risk factors include:  HTN (not on medication), smoking, obesity, marijuana use, newly diagnosed diabetes mellitus type 2, newly diagnosed congestive heart failure with reduced EF.  Acute systolic and diastolic heart failure, stage C, NYHA class II/III: Diuresed net negative urine output 4.9 L since admission Increase carvedilol to 12.5 mg p.o. twice daily. Continue Bumex 1 mg IV push twice daily. We will start Zaroxolyn 2.5 mg p.o. daily Consider TED hose or compression stockings to help facilitate fluid mobilization of his bilateral lower extremity.  Also elevated his legs. Continues to have bilateral lower extremity edema along uptrending BNP.  Echocardiogram notes grade 2 diastolic impairment with elevated left atrial pressures as well. Once the patient has been diuresed appropriately we will consider the addition of ARB and spironolactone prior to discharge depending on renal function and hemodynamics. Monitor renal and renal function and electrolytes.  Recommend checking potassium and magnesium on a daily basis. Continue telemetry. Plan of care discussed with primary  team.  Cardiomyopathy, newly diagnosed: Patient is agreeable with coronary CTA once ventricular rate is better controlled.  Newly diagnosed diabetes mellitus type 2: Currently managed by primary team.  Started on statin therapy.  Benign essential hypertension: Improving continue to monitor blood pressures.  Cigarette smoking: Educated on importance of complete smoking cessation.  Marijuana use: Educated on importance of complete cessation of marijuana.  Patient's questions and concerns were addressed to his satisfaction. He voices understanding of the instructions provided during this encounter.   This note was created using a voice recognition software as a result there may be grammatical errors inadvertently enclosed that do not reflect the nature of this encounter. Every attempt is made to correct such errors.  Tessa Lerner, DO, [facility] Office: [phone] 04/09/2020, 1:11 PM

## 2020-04-09 NOTE — Progress Notes (Signed)
Subjective:  Patient evaluated this AM. Reports he's breathing better this morning, down to 1L supplemental oxygen. Denies chest pain, cough, fevers, chills, palpitations. Discussed getting CTA today, patient agreeable.   Of note, telemetry reviewed on rounds. Overnight patient intermittently tachycardic to 190's, possibly atrial fibrillation. Patient denies any previous diagnosis of atrial fibrillation, chest pain, or palpitations overnight.   Objective:  Vital signs in last 24 hours: Vitals:   04/08/20 1230 04/08/20 1308 04/08/20 1958 04/09/20 0509  BP:  (!) 141/102 (!) 144/89 (!) 148/77  Pulse:  (!) 107 100 99  Resp:  18 20 (!) 22  Temp: (!) 97.5 F (36.4 C) 98.6 F (37 C) 98.2 F (36.8 C) 98 F (36.7 C)  TempSrc: Oral  Oral Oral  SpO2:  100% 90% 90%  Weight:      Height:       Physical Exam: General: Obese, sitting up on side of bed, no acute distress CV: Regular rate, rhythm. No murmurs, rubs, gallops appreciated. Pulm: Clear to auscultation bilaterally. No wheezing, rales, rhonchi. Normal WOB. MSK: Normal bulk, tone. 2+ pitting edema to knees bilaterally, improved from yesterday  BMP Latest Ref Rng & Units 04/09/2020 04/08/2020 04/07/2020  Glucose 70 - 99 mg/dL 248(G) 500(B) 704(U)  BUN 6 - 20 mg/dL 13 12 15   Creatinine 0.61 - 1.24 mg/dL ) 8.89(V) 6.94(H)  Sodium 135 - 145 mmol/L 140 141 140  Potassium 3.5 - 5.1 mmol/L 3.8 4.2 4.2  Chloride 98 - 111 mmol/L 96(L) 98 102  CO2 22 - 32 mmol/L 35(H) 32 28  Calcium 8.9 - 10.3 mg/dL 9.0 9.3 9.0   CBC Latest Ref Rng & Units 04/09/2020 04/07/2020  WBC 4.0 - 10.5 K/uL 5.1 4.1  Hemoglobin 13.0 - 17.0 g/dL 17.5(H) 16.0  Hematocrit 39.0 - 52.0 % 56.7(H) 53.1(H)  Platelets 150 - 400 K/uL 149(L) 156   Assessment/Plan: [name] is 44yo person with no known medical history admitted 2/22 for acute hypoxic respiratory failure, found to be in acute decompensated heart failure with mid-range ejection fraction, continuing to  diurese.  Active Problems:   Shortness of breath   Acute respiratory failure with hypoxia (HCC)   Anasarca  #Acute decompensated heart failure w/ mid-range EF (EF 40-45%) TTE yesterday revealed EF 40-45%, severe concentric left ventricular hypertrophy, grade II diastolic dysfunction, severely dilated RA, LA. This AM, appears oxygenating better, down to 1L via [place]. Appropriate urine output, 2.7L. Unknown net, no in's recorded. Renal function stable. Cardiology consulted yesterday, appreciate recommendations. Discussed with patient options for ischemic evaluation, agreeable to coronary CTA.  - F/u coronary CTA - Bumex 1mg  BID - Repleting Mg, K this AM - Daily BMP, Mg (K>4, Mg>2) - Strict I&O's - Daily weights - Outpatient polysomnography   #High blood pressure Per cardiology, started on carvedilol yesterday PM. Would also benefit from ARB/ACE. Consulted TOC, pharmacy for assistance with medications. Ideally would like to start Entresto, but cost is a concern given uninsured status. Can consider losartan while it is determined if patient can get [facility] as outpatient. Renal function stable.  - Carvedilol 6.25mg  BID - TOC, pharmacy consults for medications  #Transient tachycardia Per tele, patient with tachycardia to 190's overnight, possibly atrial fibrillation. Patient asymptomatic, no known history of arrhythmias. This AM, HR in 80's, appears in sinus rhythm. Possibly will need anticoagulation, already started on beta blocker for blood pressure control. Will continue with tele and follow-up with EKG. - F/u EKG - Tele  #Type II diabetes mellitus CBG's  remain appropriate. Will continue to monitor. - SSI  DIET: HH w/ 1500FR IVF: n/a DVT PPX: Lovenox BOWEL: MIralax CODE: FULL FAM COM: n/a  Prior to Admission Living Arrangement: Home Anticipated Discharge Location: Home Barriers to Discharge: medical management Dispo: Anticipated discharge in approximately 1-3 day(s).   Gregory Kanner, MD 04/09/2020, 5:30 AM Pager: [phone] After 5pm on weekdays and 1pm on weekends: On Call pager [phone]

## 2020-04-10 DIAGNOSIS — I4892 Unspecified atrial flutter: Secondary | ICD-10-CM

## 2020-04-10 DIAGNOSIS — I429 Cardiomyopathy, unspecified: Secondary | ICD-10-CM

## 2020-04-10 LAB — MAGNESIUM: Magnesium: 1.9 mg/dL (ref 1.7–2.4)

## 2020-04-10 LAB — GLUCOSE, CAPILLARY
Glucose-Capillary: 170 mg/dL — ABNORMAL HIGH (ref 70–99)
Glucose-Capillary: 210 mg/dL — ABNORMAL HIGH (ref 70–99)
Glucose-Capillary: 120 mg/dL — ABNORMAL HIGH (ref 70–99)
Glucose-Capillary: 221 mg/dL — ABNORMAL HIGH (ref 70–99)

## 2020-04-10 LAB — BASIC METABOLIC PANEL
Anion gap: 10 (ref 5–15)
BUN: 18 mg/dL (ref 6–20)
CO2: 36 mmol/L — ABNORMAL HIGH (ref 22–32)
Calcium: 9.4 mg/dL (ref 8.9–10.3)
Chloride: 91 mmol/L — ABNORMAL LOW (ref 98–111)
Creatinine, Ser: 1.35 mg/dL — ABNORMAL HIGH (ref 0.61–1.24)
GFR, Estimated: >60 mL/min (ref >60)
Glucose, Bld: 127 mg/dL — ABNORMAL HIGH (ref 70–99)
Potassium: 4.1 mmol/L (ref 3.5–5.1)
Sodium: 137 mmol/L (ref 135–145)

## 2020-04-10 LAB — CBC
HCT: 59.7 % — ABNORMAL HIGH (ref 39.0–52.0)
Hemoglobin: 18.5 g/dL — ABNORMAL HIGH (ref 13.0–17.0)
MCH: 27.7 pg (ref 26.0–34.0)
MCHC: 31 g/dL (ref 30.0–36.0)
MCV: 89.2 fL (ref 80.0–100.0)
Platelets: 168 10*3/uL (ref 150–400)
RBC: 6.69 MIL/uL — ABNORMAL HIGH (ref 4.22–5.81)
RDW: 18.1 % — ABNORMAL HIGH (ref 11.5–15.5)
WBC: 4.9 10*3/uL (ref 4.0–10.5)
nRBC: 0 % (ref 0.0–0.2)

## 2020-04-10 MED ORDER — MAGNESIUM SULFATE 2 GM/50ML IV SOLN
2.0000 g | Freq: Once | INTRAVENOUS | Status: AC
  Administered 2020-04-10: 2 g via INTRAVENOUS
  Filled 2020-04-10: qty 50

## 2020-04-10 MED ORDER — SACUBITRIL-VALSARTAN 49-51 MG PO TABS
1.0000 | ORAL_TABLET | Freq: Two times a day (BID) | ORAL | Status: DC
  Administered 2020-04-10 – 2020-04-14 (×8): 1 via ORAL
  Filled 2020-04-10 (×9): qty 1

## 2020-04-10 NOTE — TOC Initial Note (Addendum)
Transition of Care [facility]) - Initial/Assessment Note    Patient Details  Name: Gregory Mckenzie MRN: [id number] Date of Birth: 1975-03-10  [facility]) CM/SW Contact:    Joanne Chars, LCSW Phone Number: 04/10/2020, 10:14 AM  Clinical Narrative:    CSW met with pt to discuss PCP needs.  Pt living in [place], uninsured, agreeable to [facility].  Discussed medication needs and that hospital can supply initial medication but CHW will need to assist after that-pt verbalizes understanding.  Permission to speak with girlfriend, [name].  Pt is vaccinated and boosted for covid.  Pt reports he has own vehicle to get to medical appts.               1400: Message from Dr Collene Gobble.  They are planning to follow this pt at internal medicine office for outpt care.  Expected Discharge Plan: Home/Self Care Barriers to Discharge: Continued Medical Work up   Patient Goals and CMS Choice Patient states their goals for this hospitalization and ongoing recovery are:: "get back to 80-90%" CMS Medicare.gov Compare Post Acute Care list provided to::  (na)    Expected Discharge Plan and Services Expected Discharge Plan: Home/Self Care In-house Referral: Clinical Social Work   Post Acute Care Choice: NA Living arrangements for the past 2 months: Single Family Home                                      Prior Living Arrangements/Services Living arrangements for the past 2 months: Single Family Home Lives with:: Significant Other Patient language and need for interpreter reviewed:: Yes Do you feel safe going back to the place where you live?: Yes      Need for Family Participation in Patient Care: No (Comment) Care giver support system in place?: Yes (comment) Current home services: Other (comment) (none) Criminal Activity/Legal Involvement Pertinent to Current Situation/Hospitalization: No - Comment as needed  Activities of Daily Living Home Assistive  Devices/Equipment: None ADL Screening (condition at time of admission) Patient's cognitive ability adequate to safely complete daily activities?: Yes Is the patient deaf or have difficulty hearing?: No Does the patient have difficulty seeing, even when wearing glasses/contacts?: No Does the patient have difficulty concentrating, remembering, or making decisions?: No Patient able to express need for assistance with ADLs?: No Does the patient have difficulty dressing or bathing?: No Independently performs ADLs?: Yes (appropriate for developmental age) Does the patient have difficulty walking or climbing stairs?: No Weakness of Legs: None Weakness of Arms/Hands: None  Permission Sought/Granted Permission sought to share information with : Family Supports Permission granted to share information with : Yes, Verbal Permission Granted  Share Information with NAME: girlfriend [name]           Emotional Assessment Appearance:: Appears stated age Attitude/Demeanor/Rapport: Engaged Affect (typically observed): Appropriate,Pleasant Orientation: : Oriented to Self,Oriented to Place,Oriented to  Time,Oriented to Situation Alcohol / Substance Use: Not Applicable Psych Involvement: No (comment)  Admission diagnosis:  Shortness of breath [R06.02] Bilateral lower extremity edema [R60.0] Hypertension, unspecified type [I10] Patient Active Problem List   Diagnosis Date Noted  . Acute combined systolic and diastolic heart failure ([place])   . Bilateral lower extremity edema   . Hypertension   . Type 2 diabetes mellitus with complication, without long-term current use of insulin ([place])   . Continuous dependence on cigarette smoking   . Marijuana use   .  Class 3 severe obesity due to excess calories with serious comorbidity and body mass index (BMI) of 45.0 to 49.9 in adult [facility])   . Acute respiratory failure with hypoxia ([place])   . Anasarca   . Shortness of breath 04/07/2020   PCP:  Pcp,  No Pharmacy:   [facility] [place], [place] [address] Phone: [phone] Fax: [phone]     Social Determinants of Health (SDOH) Interventions Food Insecurity Interventions: Intervention Not Indicated Financial Strain Interventions: Other (Comment) (HF CSW) Housing Interventions: Intervention Not Indicated Transportation Interventions: Intervention Not Indicated Alcohol Brief Interventions/Follow-up: AUDIT Score <7 follow-up not indicated  Readmission Risk Interventions Readmission Risk Prevention Plan 04/10/2020  Post Dischage Appt Not Complete  Appt Comments First available appt at [place] is 3/24.  Transportation Screening Complete

## 2020-04-10 NOTE — Progress Notes (Signed)
Progress Note  Patient Name: Gregory Mckenzie Date of Encounter: 04/10/2020  Attending physician: Reymundo Poll, MD Primary care provider: Pcp, No Primary Cardiologist: NA Consultant:Samanthamarie Ezzell Odis Hollingshead, DO  Subjective: Gregory Mckenzie is a 45 y.o. male who was seen and examined at bedside at approximately 9am Shortness of breath and lower extremity swelling has improved.   Denies any chest pain.  Objective: Vital Signs in the last 24 hours: Temp:  [98.4 F (36.9 C)-98.7 F (37.1 C)] 98.7 F (37.1 C) (02/25 0758) Pulse Rate:  [77] 77 (02/24 2041) Resp:  [18-19] 19 (02/25 0758) BP: (143-148)/(77-99) 148/99 (02/25 0758) SpO2:  [95 %] 95 % (02/25 0758) Weight:  [122.2 kg] 122.2 kg (02/25 0549)  Intake/Output:  Intake/Output Summary (Last 24 hours) at 04/10/2020 1907 Last data filed at 04/10/2020 1800 Gross per 24 hour  Intake 500 ml  Output 1520 ml  Net -1020 ml    Net IO Since Admission: -7,227.39 mL [04/10/20 1907]  Weights:  Filed Weights   04/07/20 1839 04/09/20 0755 04/10/20 0549  Weight: 113.4 kg 124.2 kg 122.2 kg    Telemetry: Personally reviewed.  Physical examination: PHYSICAL EXAM: Vitals with BMI 04/10/2020 04/09/2020 04/09/2020  Height - - -  Weight 269 lbs 6 oz - -  BMI 46.22 - -  Systolic 148 143 614  Diastolic 99 77 85  Pulse - 77 92    CONSTITUTIONAL: Appears older than stated age, hemodynamically stable, obese, no acute distress.  SKIN: Skin is warm and dry. No rash noted. No cyanosis. No pallor. No jaundice HEAD: Normocephalic and atraumatic.  EYES: No scleral icterus MOUTH/THROAT: Moist oral membranes.  NECK: No JVD present. No thyromegaly noted. No carotid bruits  LYMPHATIC: No visible cervical adenopathy.  CHEST Normal respiratory effort. No intercostal retractions  LUNGS: Decreased breath sounds bilaterally with faint crackles noted.  No stridor. No wheezes. CARDIOVASCULAR: Regular,, positive S1-S2, no murmurs rubs or gallops appreciated secondary to  tachycardia. ABDOMINAL: Obese, distended, nontender, positive bowel sounds in all 4 quadrants, no apparent ascites.  EXTREMITIES: +1 bilateral peripheral edema  HEMATOLOGIC: No significant bruising NEUROLOGIC: Oriented to person, place, and time. Nonfocal. Normal muscle tone.  PSYCHIATRIC: Normal mood and affect. Normal behavior. Cooperative   Lab Results: Hematology Recent Labs  Lab 04/07/20 1701 04/09/20 0128 04/10/20 0341  WBC 4.1 5.1 4.9  RBC 5.77 6.29* 6.69*  HGB 16.0 17.5* 18.5*  HCT 53.1* 56.7* 59.7*  MCV 92.0 90.1 89.2  MCH 27.7 27.8 27.7  MCHC 30.1 30.9 31.0  RDW 18.6* 18.3* 18.1*  PLT 156 149* 168    Chemistry Recent Labs  Lab 04/07/20 2041 04/08/20 0338 04/09/20 0128 04/10/20 0341  NA  --  141 140 137  K  --  4.2 3.8 4.1  CL  --  98 96* 91*  CO2  --  32 35* 36*  GLUCOSE  --  117* 137* 127*  BUN  --  12 13 18   CREATININE  --  1.34* 1.29* 1.35*  CALCIUM  --  9.3 9.0 9.4  PROT 6.3*  --   --   --   ALBUMIN 3.2*  --   --   --   AST 23  --   --   --   ALT 41  --   --   --   ALKPHOS 81  --   --   --   BILITOT 0.8  --   --   --   GFRNONAA  --  >60 >60 >60  ANIONGAP  --  11 9 10      Cardiac Enzymes: Cardiac Panel (last 3 results) Recent Labs    04/07/20 2041  TROPONINIHS 60*    BNP (last 3 results) Recent Labs    04/07/20 1701 04/09/20 0128  BNP 878.9* 1,576.7*    ProBNP (last 3 results) No results for input(s): PROBNP in the last 8760 hours.   DDimer No results for input(s): DDIMER in the last 168 hours.   Hemoglobin A1c:  Lab Results  Component Value Date   HGBA1C 6.7 (H) 04/07/2020   MPG 145.59 04/07/2020    TSH  Recent Labs    04/07/20 2041  TSH 2.779    Lipid Panel     Component Value Date/Time   CHOL 139 04/07/2020 2041   TRIG 57 04/07/2020 2041   HDL 35 (L) 04/07/2020 2041   CHOLHDL 4.0 04/07/2020 2041   VLDL 11 04/07/2020 2041   LDLCALC 93 04/07/2020 2041    Imaging: No results found.  Cardiac  database: EKG: 04/07/2020: Sinus tachycardia, 102 bpm, right atrial enlargement, poor R wave progression, consider old lateral infarct, without underlying injury pattern, occasional ventricular ectopic beats.  04/08/2020: Sinus tachycardia, 104 bpm, right axis deviation, right atrial enlargement, poor R wave progression, without underlying ischemia or injury pattern.  Echocardiogram: 04/08/2020: 1. Left ventricular ejection fraction, by estimation, is 40 to 45%. The  left ventricle has mildly decreased function. The left ventricle  demonstrates global hypokinesis. There is severe concentric left  ventricular hypertrophy. Left ventricular diastolic  parameters are consistent with Grade II diastolic dysfunction  (pseudonormalization). Elevated left atrial pressure.  2. Right ventricular systolic function was not well visualized. The right  ventricular size is mildly enlarged. There is normal pulmonary artery  systolic pressure.  3. Left atrial size was severely dilated.  4. Right atrial size was severely dilated.  5. The mitral valve is normal in structure. Trivial mitral valve  regurgitation. No evidence of mitral stenosis.  6. The aortic valve is normal in structure. Aortic valve regurgitation is  not visualized. No aortic stenosis is present.  7. The inferior vena cava is dilated in size with >50% respiratory  variability, suggesting right atrial pressure of 8 mmHg.  Scheduled Meds: . atorvastatin  40 mg Oral QHS  . bumetanide (BUMEX) IV  1 mg Intravenous Q12H  . carvedilol  12.5 mg Oral BID WC  . enoxaparin (LOVENOX) injection  55 mg Subcutaneous Q24H  . insulin aspart  0-15 Units Subcutaneous TID WC  . metolazone  2.5 mg Oral Daily  . nicotine  14 mg Transdermal Daily  . sacubitril-valsartan  1 tablet Oral BID    Continuous Infusions:   PRN Meds: acetaminophen **OR** acetaminophen, polyethylene glycol   IMPRESSION & RECOMMENDATIONS: Gregory Mckenzie is a 45 y.o. male  whose past medical history and cardiac risk factors include:  HTN (not on medication), smoking, obesity, marijuana use, newly diagnosed diabetes mellitus type 2, newly diagnosed congestive heart failure with reduced EF, newly diagnosed atrial flutter.  Acute systolic and diastolic heart failure, stage C, NYHA class II/III: Diuresed net negative urine output 7.7 L since admission Continue carvedilol to 12.5 mg p.o. twice daily. Continue Bumex 1 mg IV push twice daily and Zaroxolyn 2.5 mg p.o. daily.   Requested both the primary team and nursing staff to consider TED hose or compression stockings to help facilitate fluid mobilization of his bilateral lower extremity and also elevated his legs when possible. Recommend checking electrolytes on a daily basis as he is  currently being diuresed. Monitor renal function.  We will start Entresto 49/51 mg p.o. twice daily. Heart failure pharmacist following to help facilitate medications at discharge.  Efforts appreciated Continue telemetry.  Cardiomyopathy, newly diagnosed: Etiology unknown. Initial plan was to proceed with coronary CTA; however, his heart rate remains labile. Plan is to proceed with Lexiscan to evaluate for reversible ischemia in the setting of newly diagnosed cardiomyopathy. On telemetry patient was noted to have narrow complex tachycardia on 04/08/2020.   Rhythm strips reviewed.  Findings consistent with atrial flutter. Recommend outpatient sleep study.  Paroxysmal atrial flutter: Documented on telemetry during his hospitalization, telemetry strips available under the media section dating 04/08/2020. CHA2DS2-VASc SCORE is 3 which correlates to 3.2 % risk of stroke per year. Recommend anticoagulation if no contraindications, will defer to primary team if they would like to either start IV Heparin and then transition to [facility] or start OAC.  In the acute setting with newly diagnosed cardiomyopathy, diabetes, uncontrolled hypertension, and  episodes of paroxysmal atrial flutter and his CHA2DS2-VASc score would recommend oral anticoagulation for thromboembolic prophylaxis. Once discharge would recommend a monitor to evaluate the burden of atrial flutter and/or other dysrhythmias. And also consider outpatient EP consultation for atrial flutter ablation in the setting of systolic and diastolic heart failure and underlying cardiomyopathy. Sleep study evaluation highly recommended as well.  Continue telemetry.  Patient is educated on the importance of outpatient follow-up and medication compliance.  Newly diagnosed diabetes mellitus type 2: Currently managed by primary team.    Benign essential hypertension: Improving continue to monitor blood pressures.  Cigarette smoking: Educated on importance of complete smoking cessation.  Marijuana use: Educated on importance of complete cessation of marijuana.  Patient's questions and concerns were addressed to his satisfaction. He voices understanding of the instructions provided during this encounter.   This note was created using a voice recognition software as a result there may be grammatical errors inadvertently enclosed that do not reflect the nature of this encounter. Every attempt is made to correct such errors.  Tessa Lerner, DO, [facility] Office: [phone] 04/10/2020, 7:07 PM

## 2020-04-10 NOTE — Progress Notes (Addendum)
Entered in error

## 2020-04-10 NOTE — Progress Notes (Signed)
Education Assessment and Provision:  Detailed education and instructions provided on heart failure disease management including the following:  Signs and symptoms of Heart Failure When to call the physician Importance of daily weights Low sodium diet Fluid restriction Medication management Anticipated future follow-up appointments  Patient education given on each of the above topics.  Patient acknowledges understanding via teach back method and acceptance of all instructions.  Education Materials:  "Living Better With Heart Failure" Booklet, HF zone tool, & Daily Weight Tracker Tool.  Patient has scale at home: no, given from AHF clinic Patient has pill box at home: no, given from AHF clinic.   Scheduled appt with HV TOC Friday MArch 4 @ 1pm.  Ozella Rocks, RN, BSN Heart Failure Nurse Navigator [phone]

## 2020-04-10 NOTE — Progress Notes (Signed)
Heart Failure Stewardship Pharmacist Progress Note   PCP: Pcp, No PCP-Cardiologist: No primary care provider on file.    HPI:  45 yo M with no significant prior medical history. He presented to the ED on 04/07/20 with shortness of breath and LE edema. An ECHO was done on 04/08/20 and LVEF was 40-45%. Pending ischemic evaluation.  Current HF Medications: Bumetanide 1 mg IV BID Metolazone 2.5 mg daily Carvedilol 12.5 mg BID  Prior to admission HF Medications: None  Pertinent Lab Values: . Serum creatinine 1.35, BUN 18, Potassium 4.1, Sodium 137, BNP 1576.7, Magnesium 1.9   Vital Signs: . Weight: 269 lbs (admission weight: 273 lbs) . Blood pressure: 150/90s  . Heart rate: 80s   Medication Assistance / Insurance Benefits Check: Does the patient have prescription insurance?  No  Does the patient qualify for medication assistance through manufacturers or grants?   Pending . Eligible grants and/or patient assistance programs: pending . Medication assistance applications in progress: none  . Medication assistance applications approved: none Approved medication assistance renewals will be completed by: TBD  Outpatient Pharmacy:  Prior to admission outpatient pharmacy: [facility] Is the patient willing to use [facility] TOC pharmacy at discharge? Yes Is the patient willing to transition their outpatient pharmacy to utilize a [facility] outpatient pharmacy?   Pending    Assessment: 1. Acute on chronic systolic CHF (EF 51-02%), pending ischemic evaluation. NYHA class II/III symptoms. - Continue bumetanide 1 mg IV BID - Continue metolazone 2.5 mg daily - Continue carvedilol 12.5 mg BID - Consider starting Entresto 24/26 mg BID - Consider starting spironolactone and Farxiga prior to discharge   Plan: 1) Medication changes recommended at this time: - Start Entresto 24/26 mg BID   2) Patient assistance application(s): - None pending - Can help assist with enrolling in patient assistance  programs for Sherryll Burger and Farxiga if these are initiated  3)  Education  - To be completed prior to discharge  Sharen Hones, PharmD, BCPS Heart Failure Engineer, building services Phone [phone]

## 2020-04-10 NOTE — Progress Notes (Signed)
   Subjective:  Patient evaluated at bedside this AM. Patient reports he is breathing better, leg swelling has decreased. Denies chest pain, palpitations.  Objective:  Vital signs in last 24 hours: Vitals:   04/09/20 0758 04/09/20 1515 04/09/20 2041 04/10/20 0549  BP: 138/73 140/85 (!) 143/77   Pulse: 96 92 77   Resp: 16 16 18    Temp: 98.9 F (37.2 C) 98.2 F (36.8 C) 98.4 F (36.9 C)   TempSrc: Oral  Oral   SpO2: 98% 91% 95%   Weight:    122.2 kg  Height:       Physical Exam: General: Sitting on side of bed, no acute distress CV: Regular rate, rhythm. No m/r/g Pulm: Clear to auscultation bilaterally. No rales, wheezing MSK: 1+ pitting edema bilaterally to mid-tibia  Assessment/Plan: [name] is 45yo person with no known prior medical history admitted 2/22 for acute hypoxic respiratory failure, found to be in acute decompensated heart failure with mid-range EF, continuing to diurese and pending ischemic evaluation.  Active Problems:   Shortness of breath   Acute respiratory failure with hypoxia (HCC)   Anasarca   Acute combined systolic and diastolic heart failure (HCC)   Bilateral lower extremity edema   Hypertension   Type 2 diabetes mellitus with complication, without long-term current use of insulin (HCC)   Continuous dependence on cigarette smoking   Marijuana use   Class 3 severe obesity due to excess calories with serious comorbidity and body mass index (BMI) of 45.0 to 49.9 in adult [facility])  #Acute decompensated heart failure w/ mid-range EF (EF40-45%) Patient continuing to diurese well on Bumex, less edematous on exam this morning. Renal function stable. Cardiology following, appreciate recommendations. Will plan for coronary CTA today vs tomorrow, will need HR<60bpm. - F/u coronary CTA - Bumex 1mg  BID, metolazone 2.5mg  qd - Daily BMP, Mb - Strict I&O's - Daily weights - Outpatient polysomnography  #Elevated blood pressure Increased coreg yesterday,  BP continues to be elevated with systolics in 140's. Can consider starting losartan for better blood pressure control, renal function stable. TOC, pharmacy working with patient to obtain [facility]. - Coreg 12.5mg  bid - TOC, pharmacy consults for medications  #Type II diabetes mellitus CBG's appropriate, no further medications needed at this time. - SSI  #Polycythemia H/H this AM 18.5/60. Most likely due to hypoxia related to sleep apnea. Will check EPO level, if low will need further testing for JAK2 mutation. - Daily CBC - F/u EPO level  #Transient tachycardia Asymptomatic. No further episodes overnight. Will have cardiology review telemetry strip. - Tele  DIET: HH w/ FR IVF: n/a DVT PPX: Lovenox BOWEL: Miralax CODE: FULL FAM COM: n/a  Prior to Admission Living Arrangement: Home Anticipated Discharge Location: Home Barriers to Discharge: medical management Dispo: Anticipated discharge in approximately 1-3 day(s).   Clifton Custard, MD 04/10/2020, 6:13 AM Pager: [phone] After 5pm on weekdays and 1pm on weekends: On Call pager [phone]

## 2020-04-10 NOTE — Progress Notes (Signed)
RT NOTE:  CPAP ordered for patient tonight. Pt has no documented history or sleep study on file. According to protocol, we are unable to initiate CPAP therapy without a sleep study or diagnosis. It is not the best care for the patient since we are unable to determine appropriate settings or correct mask for therapy. RN is aware.

## 2020-04-11 ENCOUNTER — Inpatient Hospital Stay (HOSPITAL_COMMUNITY): Payer: Self-pay

## 2020-04-11 LAB — CBC
HCT: 64.4 % — ABNORMAL HIGH (ref 39.0–52.0)
Hemoglobin: 20.3 g/dL — ABNORMAL HIGH (ref 13.0–17.0)
MCH: 27.7 pg (ref 26.0–34.0)
MCHC: 31.5 g/dL (ref 30.0–36.0)
MCV: 87.9 fL (ref 80.0–100.0)
Platelets: 182 10*3/uL (ref 150–400)
RBC: 7.33 MIL/uL — ABNORMAL HIGH (ref 4.22–5.81)
RDW: 18.2 % — ABNORMAL HIGH (ref 11.5–15.5)
WBC: 4.8 10*3/uL (ref 4.0–10.5)
nRBC: 0 % (ref 0.0–0.2)

## 2020-04-11 LAB — BASIC METABOLIC PANEL
Anion gap: 11 (ref 5–15)
BUN: 22 mg/dL — ABNORMAL HIGH (ref 6–20)
CO2: 35 mmol/L — ABNORMAL HIGH (ref 22–32)
Calcium: 9.4 mg/dL (ref 8.9–10.3)
Chloride: 90 mmol/L — ABNORMAL LOW (ref 98–111)
Creatinine, Ser: 1.64 mg/dL — ABNORMAL HIGH (ref 0.61–1.24)
GFR, Estimated: 52 mL/min — ABNORMAL LOW (ref >60)
Glucose, Bld: 137 mg/dL — ABNORMAL HIGH (ref 70–99)
Potassium: 4.1 mmol/L (ref 3.5–5.1)
Sodium: 136 mmol/L (ref 135–145)

## 2020-04-11 LAB — GLUCOSE, CAPILLARY
Glucose-Capillary: 122 mg/dL — ABNORMAL HIGH (ref 70–99)
Glucose-Capillary: 150 mg/dL — ABNORMAL HIGH (ref 70–99)
Glucose-Capillary: 170 mg/dL — ABNORMAL HIGH (ref 70–99)
Glucose-Capillary: 159 mg/dL — ABNORMAL HIGH (ref 70–99)

## 2020-04-11 LAB — BRAIN NATRIURETIC PEPTIDE: B Natriuretic Peptide: 759.8 pg/mL — ABNORMAL HIGH (ref 0.0–100.0)

## 2020-04-11 LAB — MAGNESIUM: Magnesium: 1.9 mg/dL (ref 1.7–2.4)

## 2020-04-11 MED ORDER — REGADENOSON 0.4 MG/5ML IV SOLN
0.4000 mg | Freq: Once | INTRAVENOUS | Status: AC
  Filled 2020-04-11: qty 5

## 2020-04-11 MED ORDER — TECHNETIUM TC 99M TETROFOSMIN IV KIT
11.0000 | PACK | Freq: Once | INTRAVENOUS | Status: AC | PRN
  Administered 2020-04-11: 11 via INTRAVENOUS

## 2020-04-11 MED ORDER — REGADENOSON 0.4 MG/5ML IV SOLN
INTRAVENOUS | Status: AC
  Administered 2020-04-11: 0.4 mg via INTRAVENOUS
  Filled 2020-04-11: qty 5

## 2020-04-11 MED ORDER — RIVAROXABAN 20 MG PO TABS
20.0000 mg | ORAL_TABLET | Freq: Every day | ORAL | Status: DC
  Administered 2020-04-11 – 2020-04-13 (×3): 20 mg via ORAL
  Filled 2020-04-11 (×3): qty 1

## 2020-04-11 MED ORDER — TECHNETIUM TC 99M TETROFOSMIN IV KIT
31.8000 | PACK | Freq: Once | INTRAVENOUS | Status: AC | PRN
  Administered 2020-04-11: 31.8 via INTRAVENOUS

## 2020-04-11 NOTE — Progress Notes (Signed)
Subjective:  No significant overnight events. He is feeling well today and asking about discharge. Discussed that we need to make some medication adjustments and make sure he is stable and has access to affordable medications prior to discharge.  Objective:  Vital signs in last 24 hours: Vitals:   04/10/20 0549 04/10/20 0758 04/10/20 2049 04/11/20 0652  BP:  (!) 148/99 132/77 107/75  Pulse:   88 77  Resp:  19 17 20   Temp:  98.7 F (37.1 C) 98.9 F (37.2 C) 98.7 F (37.1 C)  TempSrc:  Oral Oral   SpO2:  95% 97% 98%  Weight: 122.2 kg     Height:       Physical Exam: General: in NAD Cardiac: extremities warm. RRR. +1 LE edema  Assessment/Plan: [name] is 45yo person with no known prior medical history admitted 2/22 for acute hypoxic respiratory failure, found to be in acute decompensated heart failure with mid-range EF, continuing to diurese and pending ischemic evaluation.  Principal Problem:   Acute combined systolic and diastolic heart failure (HCC) Active Problems:   Shortness of breath   Acute respiratory failure with hypoxia (HCC)   Anasarca   Bilateral lower extremity edema   Hypertension   Type 2 diabetes mellitus with complication, without long-term current use of insulin (HCC)   Continuous dependence on cigarette smoking   Marijuana use   Class 3 severe obesity due to excess calories with serious comorbidity and body mass index (BMI) of 45.0 to 49.9 in adult [facility])   Paroxysmal atrial flutter (HCC)   Cardiomyopathy (HCC)  #Newly diagnosed acute NICM with combined heart failure Lexiscan earlier today did not reveal ischemic changes. Suspect CM is attributed to uncontrolled hypertension vs arrhythmia induced Plan -appreciate cardiology consult--discussed plan following lexiscan>>will hold bumex for today and reassess volume status in AM.  -entresto started 2/25--titrate up as tolerated -will continue to follow electrolytes and renal function with daily  BMP. Maintain K>4, Mg>2 -Strict I/O, TED hose -will need referral for a sleep study after discharge -appreciate HF pharmacist assisting with discharge medications. Would ideally start on an SGLT2 if this is feasible for him financially at discharge   Paroxysmal atrial flutter as noted on tele monitoring. CHADSVASC 3 (CHF, HTN, DM) indicating a 3.2% stroke risk per year.  No known prior major bleeding. Risks and benefits of anticoagulation discussed today. He also notes that these were reviewed by cardiology earlier today. Plan -start xarelto for [facility] -rate control with Coreg -monitor with continuous tele -appreciate cardiology assisting in setting pt up with tele monitoring after discharge to evaluate aflutter burden -referral to EP at discharge to assess candidacy for ablation, sleep study as above  #Essential Hypertension Improved with increase in coreg yesterday -continue coreg  #Acute hypoxic respiratory failure resolved this morning. Now on RA  #Type II diabetes mellitus. Fasting glucose within hospital range goal. Received 8U total of novolog yesterday -continue SSI for now -defer metformin for now while diuresing -SGLT2 at discharge as noted above  #Polycythemia Hgb up to 20 this morning, Hct 64. Asymptomatic. The increase since admission likely relative in the setting of diuresis. Most likely due to hypoxia related to sleep apnea, cigarette smoking, and maybe some baseline hypoxia as it is unclear how long he has been volume overloaded for. No evidence of right to left shunting on echocardiogram.  Given the number of risk factors he has, will start with EPO level testing. If low, then would pursue JAK2 testing -  Daily CBC - F/u EPO level  #Tobacco and marijuana use disorder. Recommend cessation  #Socioeconomic limitations. Currently uninsured and without PCP. He will [facility] in [facility] after discharge and, once established, will be set up an  appointment with our financial counselor to assist with obtaining coverage. Appreciate HF pharmacist assisting in obtaining meds for discharge.  DIET: HH w/ FR IVF: n/a DVT PPX: Lovenox BOWEL: Miralax CODE: FULL FAM COM: n/a  Prior to Admission Living Arrangement: Home Anticipated Discharge Location: Home Barriers to Discharge: medical management Dispo: will likely be stable for discharge 2/28 or 3/1  Elige Radon, MD Internal Medicine Resident PGY-2 [facility] Pager: [phone] 04/11/2020 7:38 AM    After 5pm on weekdays and 1pm on weekends: On Call pager [phone]

## 2020-04-11 NOTE — Progress Notes (Signed)
ANTICOAGULATION CONSULT NOTE - Initial Consult  Pharmacy Consult for rivaroxaban Indication: atrial fibrillation  No Known Allergies  Patient Measurements: Height: 5\' 4"  (162.6 cm) Weight: 122.2 kg (269 lb 6.4 oz) IBW/kg (Calculated) : 59.2   Vital Signs: Temp: 97.8 F (36.6 C) (02/26 1413) BP: 103/65 (02/26 1413) Pulse Rate: 91 (02/26 1413)  Labs: Recent Labs    04/09/20 0128 04/10/20 0341 04/11/20 0232  HGB 17.5* 18.5* 20.3*  HCT 56.7* 59.7* 64.4*  PLT 149* 168 182  CREATININE 1.29* 1.35* 1.64*    Estimated Creatinine Clearance: 67.2 mL/min (A) (by C-G formula based on SCr of 1.64 mg/dL (H)).   Medical History: Past Medical History:  Diagnosis Date  . Hypertension     Assessment: 44yom admitted with volume overload and new heart failure.  Patient having runs Afib/flutter.  Crcl >69ml/min, h/h and pltc stable Will begin rivaroxaban for anticoagulation for stroke preventions.    Goal of Therapy:   Monitor platelets by anticoagulation protocol: Yes   Plan:  rivaroxaban 20mg  daily with evening meal Monitor bleeding, cbc, renal funtion  43m Pharm.D. CPP, BCPS Clinical Pharmacist [phone] 04/11/2020 4:24 PM

## 2020-04-11 NOTE — Progress Notes (Signed)
Progress Note  Patient Name: Gregory Mckenzie Date of Encounter: 04/11/2020  Attending physician: Reymundo Poll, MD Primary care provider: Pcp, No Primary Cardiologist: NA Consultant:Yazmyne Sara Odis Hollingshead, DO  Subjective: Jaeshawn Mckenzie is a 45 y.o. male who was seen and examined at bedside at approximately 9am Patient seen in nuclear medicine prior to stress test No events overnight. Net Urine output of -9L since admission Shortness of breath and lower extremity swelling has improved.   Denies any chest pain.  Objective: Vital Signs in the last 24 hours: Temp:  [98.7 F (37.1 C)-98.9 F (37.2 C)] 98.7 F (37.1 C) (02/26 0652) Pulse Rate:  [77-88] 77 (02/26 0652) Resp:  [17-20] 20 (02/26 0652) BP: (79-147)/(64-83) 110/83 (02/26 0935) SpO2:  [97 %-98 %] 98 % (02/26 1610)  Intake/Output:  Intake/Output Summary (Last 24 hours) at 04/11/2020 1053 Last data filed at 04/11/2020 9604 Gross per 24 hour  Intake 500 ml  Output 2775 ml  Net -2275 ml    Net IO Since Admission: -9,102.39 mL [04/11/20 1053]  Weights:  Filed Weights   04/07/20 1839 04/09/20 0755 04/10/20 0549  Weight: 113.4 kg 124.2 kg 122.2 kg    Telemetry: Personally reviewed.  Physical examination: PHYSICAL EXAM: Vitals with BMI 04/11/2020 04/11/2020 04/11/2020  Height - - -  Weight - - -  BMI - - -  Systolic 110 93 79  Diastolic 83 69 64  Pulse - - -    CONSTITUTIONAL: Appears older than stated age, hemodynamically stable, obese, no acute distress.  SKIN: Skin is warm and dry. No rash noted. No cyanosis. No pallor. No jaundice HEAD: Normocephalic and atraumatic.  EYES: No scleral icterus MOUTH/THROAT: Moist oral membranes.  NECK: No JVD present. No thyromegaly noted. No carotid bruits  LYMPHATIC: No visible cervical adenopathy.  CHEST Normal respiratory effort. No intercostal retractions  LUNGS: Decreased breath sounds bilaterally with faint crackles noted.  No stridor. No wheezes. CARDIOVASCULAR: Regular,  positive S1-S2, no murmurs rubs or gallops appreciated secondary to tachycardia. ABDOMINAL: Obese, distended, nontender, positive bowel sounds in all 4 quadrants, no apparent ascites.  EXTREMITIES: +1 bilateral peripheral edema  HEMATOLOGIC: No significant bruising NEUROLOGIC: Oriented to person, place, and time. Nonfocal. Normal muscle tone.  PSYCHIATRIC: Normal mood and affect. Normal behavior. Cooperative   Lab Results: Hematology Recent Labs  Lab 04/09/20 0128 04/10/20 0341 04/11/20 0232  WBC 5.1 4.9 4.8  RBC 6.29* 6.69* 7.33*  HGB 17.5* 18.5* 20.3*  HCT 56.7* 59.7* 64.4*  MCV 90.1 89.2 87.9  MCH 27.8 27.7 27.7  MCHC 30.9 31.0 31.5  RDW 18.3* 18.1* 18.2*  PLT 149* 168 182    Chemistry Recent Labs  Lab 04/07/20 2041 04/08/20 0338 04/09/20 0128 04/10/20 0341 04/11/20 0232  NA  --    < > 140 137 136  K  --    < > 3.8 4.1 4.1  CL  --    < > 96* 91* 90*  CO2  --    < > 35* 36* 35*  GLUCOSE  --    < > 137* 127* 137*  BUN  --    < > 13 18 22*  CREATININE  --    < > 1.29* 1.35* 1.64*  CALCIUM  --    < > 9.0 9.4 9.4  PROT 6.3*  --   --   --   --   ALBUMIN 3.2*  --   --   --   --   AST 23  --   --   --   --  ALT 41  --   --   --   --   ALKPHOS 81  --   --   --   --   BILITOT 0.8  --   --   --   --   GFRNONAA  --    < > >60 >60 52*  ANIONGAP  --    < > 9 10 11    < > = values in this interval not displayed.     Cardiac Enzymes: Cardiac Panel (last 3 results) No results for input(s): CKTOTAL, CKMB, TROPONINIHS, RELINDX in the last 72 hours.  BNP (last 3 results) Recent Labs    04/07/20 1701 04/09/20 0128 04/11/20 0232  BNP 878.9* 1,576.7* 759.8*    ProBNP (last 3 results) No results for input(s): PROBNP in the last 8760 hours.   DDimer No results for input(s): DDIMER in the last 168 hours.   Hemoglobin A1c:  Lab Results  Component Value Date   HGBA1C 6.7 (H) 04/07/2020   MPG 145.59 04/07/2020    TSH  Recent Labs    04/07/20 2041  TSH 2.779     Lipid Panel     Component Value Date/Time   CHOL 139 04/07/2020 2041   TRIG 57 04/07/2020 2041   HDL 35 (L) 04/07/2020 2041   CHOLHDL 4.0 04/07/2020 2041   VLDL 11 04/07/2020 2041   LDLCALC 93 04/07/2020 2041    Imaging: No results found.  Cardiac database: EKG: 04/07/2020: Sinus tachycardia, 102 bpm, right atrial enlargement, poor R wave progression, consider old lateral infarct, without underlying injury pattern, occasional ventricular ectopic beats.  04/08/2020: Sinus tachycardia, 104 bpm, right axis deviation, right atrial enlargement, poor R wave progression, without underlying ischemia or injury pattern.  Echocardiogram: 04/08/2020: 1. Left ventricular ejection fraction, by estimation, is 40 to 45%. The  left ventricle has mildly decreased function. The left ventricle  demonstrates global hypokinesis. There is severe concentric left  ventricular hypertrophy. Left ventricular diastolic  parameters are consistent with Grade II diastolic dysfunction  (pseudonormalization). Elevated left atrial pressure.  2. Right ventricular systolic function was not well visualized. The right  ventricular size is mildly enlarged. There is normal pulmonary artery  systolic pressure.  3. Left atrial size was severely dilated.  4. Right atrial size was severely dilated.  5. The mitral valve is normal in structure. Trivial mitral valve  regurgitation. No evidence of mitral stenosis.  6. The aortic valve is normal in structure. Aortic valve regurgitation is  not visualized. No aortic stenosis is present.  7. The inferior vena cava is dilated in size with >50% respiratory  variability, suggesting right atrial pressure of 8 mmHg.  Scheduled Meds: . atorvastatin  40 mg Oral QHS  . bumetanide (BUMEX) IV  1 mg Intravenous Q12H  . carvedilol  12.5 mg Oral BID WC  . enoxaparin (LOVENOX) injection  55 mg Subcutaneous Q24H  . insulin aspart  0-15 Units Subcutaneous TID WC  . nicotine   14 mg Transdermal Daily  . sacubitril-valsartan  1 tablet Oral BID    Continuous Infusions:   PRN Meds: acetaminophen **OR** acetaminophen, polyethylene glycol   IMPRESSION & RECOMMENDATIONS: Hosey Burmester is a 45 y.o. male whose past medical history and cardiac risk factors include:  HTN (not on medication), smoking, obesity, marijuana use, newly diagnosed diabetes mellitus type 2, newly diagnosed congestive heart failure with reduced EF, newly diagnosed atrial flutter.  Acute systolic and diastolic heart failure, stage C, NYHA class II/III: Diuresed net negative  urine output 9.1 L since admission Continue carvedilol to 12.5 mg p.o. twice daily. Change IV Bumex to oral. Discontinue Zaroxolyn as patient has diuresed well and uptrend in creatinine level. Continue Entresto. We will hold off on the titration of the Entresto dose for the addition of Farxiga at this time given his acute kidney injury. Requested both the primary team and nursing staff to consider TED hose or compression stockings to help facilitate fluid mobilization of his bilateral lower extremity and also elevated his legs when possible. Recommend checking electrolytes on a daily basis as he is currently being diuresed. Monitor renal function.  Heart failure pharmacist following to help facilitate medications at discharge.  Efforts appreciated Continue telemetry.  Cardiomyopathy, newly diagnosed: Etiology unknown. Lexiscan results forthcoming. Recommend outpatient sleep study.  Paroxysmal atrial flutter: During his hospitalization patient underlying rhythm remains in normal sinus.  However, intermittently he has episodes of atrial flutter and at times atrial fibrillation.  Telemetry strips are noted in "CV strips" and "media" section of his EMR chart for reference.  CHA2DS2-VASc SCORE is 3 which correlates to 3.2 % risk of stroke per year. In the acute setting with newly diagnosed cardiomyopathy, diabetes, uncontrolled  hypertension, and episodes of paroxysmal atrial flutter and his CHA2DS2-VASc score would recommend oral anticoagulation for thromboembolic prophylaxis. Recommend anticoagulation if no contraindications, will defer to primary team if they would like to either start IV Heparin and then transition to [facility] or start OAC.  Spoke to the patient regarding oral anticoagulation during morning rounds.  Patient is considering it and would like to discuss it further with his primary team.  Risks, benefits, and alternatives to oral anticoagulation also reviewed with him during today's encounter. Upon discharge consider a monitor to evaluate the burden of atrial flutter/fib and/or other dysrhythmias. And also consider outpatient EP consultation for atrial flutter ablation in the setting of systolic and diastolic heart failure and underlying cardiomyopathy. Sleep study evaluation highly recommended as well.  Continue telemetry.  Patient is educated on the importance of outpatient follow-up and medication compliance.  Acute kidney injury:  Most likely secondary to diuresis induced.  Baseline kidney function unknown. Discontinue Zaroxolyn. Transition to oral Bumex. Continue Entresto for now with close monitoring of renal function. Avoid nephrotoxic agents.  Newly diagnosed diabetes mellitus type 2: Currently managed by primary team.    Benign essential hypertension: Improving continue to monitor blood pressures.  Cigarette smoking: Educated on importance of complete smoking cessation.  Marijuana use: Educated on importance of complete cessation of marijuana.  Patient's questions and concerns were addressed to his satisfaction. He voices understanding of the instructions provided during this encounter.   This note was created using a voice recognition software as a result there may be grammatical errors inadvertently enclosed that do not reflect the nature of this encounter. Every attempt is made to correct  such errors.  Tessa Lerner, DO, [facility] Office: [phone] 04/11/2020, 10:53 AM

## 2020-04-12 LAB — CBC
HCT: 61.7 % — ABNORMAL HIGH (ref 39.0–52.0)
Hemoglobin: 20 g/dL — ABNORMAL HIGH (ref 13.0–17.0)
MCH: 27.9 pg (ref 26.0–34.0)
MCHC: 32.4 g/dL (ref 30.0–36.0)
MCV: 86.2 fL (ref 80.0–100.0)
Platelets: 178 10*3/uL (ref 150–400)
RBC: 7.16 MIL/uL — ABNORMAL HIGH (ref 4.22–5.81)
RDW: 17.7 % — ABNORMAL HIGH (ref 11.5–15.5)
WBC: 6.2 10*3/uL (ref 4.0–10.5)
nRBC: 0 % (ref 0.0–0.2)

## 2020-04-12 LAB — BASIC METABOLIC PANEL
Anion gap: 13 (ref 5–15)
BUN: 34 mg/dL — ABNORMAL HIGH (ref 6–20)
CO2: 34 mmol/L — ABNORMAL HIGH (ref 22–32)
Calcium: 9.2 mg/dL (ref 8.9–10.3)
Chloride: 88 mmol/L — ABNORMAL LOW (ref 98–111)
Creatinine, Ser: 1.92 mg/dL — ABNORMAL HIGH (ref 0.61–1.24)
GFR, Estimated: 43 mL/min — ABNORMAL LOW (ref >60)
Glucose, Bld: 115 mg/dL — ABNORMAL HIGH (ref 70–99)
Potassium: 4 mmol/L (ref 3.5–5.1)
Sodium: 135 mmol/L (ref 135–145)

## 2020-04-12 LAB — MAGNESIUM: Magnesium: 2 mg/dL (ref 1.7–2.4)

## 2020-04-12 LAB — ERYTHROPOIETIN: Erythropoietin: 3.9 m[IU]/mL (ref 2.6–18.5)

## 2020-04-12 LAB — GLUCOSE, CAPILLARY
Glucose-Capillary: 126 mg/dL — ABNORMAL HIGH (ref 70–99)
Glucose-Capillary: 191 mg/dL — ABNORMAL HIGH (ref 70–99)
Glucose-Capillary: 126 mg/dL — ABNORMAL HIGH (ref 70–99)
Glucose-Capillary: 249 mg/dL — ABNORMAL HIGH (ref 70–99)

## 2020-04-12 NOTE — Progress Notes (Signed)
Subjective:  No significant overnight events Continues to express a desire to expedite his discharge. We discussed the importance of ongoing close monitoring while adjusting his medications and evaluating his volume status to avoid readmission. He expresses frustration but understanding.  Objective:  Vital signs in last 24 hours: Vitals:   04/11/20 1413 04/11/20 2134 04/12/20 0515 04/12/20 0517  BP: 103/65 106/73 91/73 (!) 118/93  Pulse: 91 88 65 84  Resp: 18 19 19 19   Temp: 97.8 F (36.6 C) 98 F (36.7 C) 98 F (36.7 C) 98 F (36.7 C)  TempSrc:  Oral  Oral  SpO2: 96% 92% 92% 99%  Weight:      Height:       Physical Exam: General: chronically ill appearing in NAD Cardiac: +1 pitting LE edema, regular rate, extremities warm Pulm: now 4L supplemental oxygen breathing comfortably. Minimal bibasilar crackles.  Assessment/Plan: Gregory Mckenzie is 44yo person with no known prior medical history admitted 2/22 for acute hypoxic respiratory failure, found to be in acute decompensated heart failure with mid-range EF, continuing to diurese and pending ischemic evaluation.  Principal Problem:   Acute combined systolic and diastolic heart failure (HCC) Active Problems:   Shortness of breath   Acute respiratory failure with hypoxia (HCC)   Anasarca   Bilateral lower extremity edema   Hypertension   Type 2 diabetes mellitus with complication, without long-term current use of insulin (HCC)   Continuous dependence on cigarette smoking   Marijuana use   Class 3 severe obesity due to excess calories with serious comorbidity and body mass index (BMI) of 45.0 to 49.9 in adult [facility])   Paroxysmal atrial flutter (HCC)   Cardiomyopathy (HCC)  Acute on chronic hypoxic hypercarbic respiratory failure secondary to pulmonary edema and suspected untreated OSA Continues to intermittently require supplemental oxygen.  #Newly diagnosed NICM with acute combined heart failure exacerbation Ischemic  eval complete--no evidence of reversible ischemia on lexiscan. Suspect hypertensive CM 24 UOP 1L Plan -appreciate cardiology consult -diuresis per cardiology. May consider holding off on it today with his decline in renal function -entresto started 2/25--titrate up as tolerated. Will defer dose increase given slow decline in GFR  -will continue to defer SGLT2 for now until renal function stabilizes -will continue to follow electrolytes and renal function with daily BMP. Maintain K>4, Mg>2 -Strict I/O, TED hose -will need referral for a sleep study after discharge  Paroxysmal atrial flutter as noted on tele monitoring. CHADSVASC 3 (CHF, HTN, DM) indicating a 3.2% stroke risk per year.  Plan -rate control on coreg, anticoagulation on xarelto (started 2/26) - continuous tele -appreciate cardiology assisting in setting pt up with tele monitoring after discharge to evaluate aflutter burden -referral to EP at discharge to assess candidacy for ablation, sleep study as above  #Essential Hypertension -continue coreg and entresto  #AKI secondary to overdiuresis. Decrease GFR also expected with starting entresto. Will need to see stabilization of renal function prior to discharge.  #Type II diabetes mellitus. Fasting glucose within hospital range goal.  -continue SSI for now -defer metformin/SGLT2 until renal function stablizes  #Polycythemia. Stable. The increase since admission likely relative in the setting of diuresis. Most likely due to hypoxia related to sleep apnea, cigarette smoking, and maybe some baseline hypoxia as it is unclear how long he has been volume overloaded for.  Given the number of risk factors he has, will start with EPO level testing. If low, then would pursue JAK2 testing - Daily CBC - F/u EPO  level  #Tobacco and marijuana use disorder. Recommend cessation  #Socioeconomic limitations, high risk readmission. Currently uninsured and without PCP. We are working on  mitigating risk factors for readmission.He will [facility] in [facility] after discharge and, once established, will be set up an appointment with our financial counselor to assist with obtaining coverage. Appreciate HF pharmacist and TOC assisting in obtaining meds for discharge.  DIET: HH w/ FR IVF: n/a DVT PPX: xarelto BOWEL: Miralax CODE: FULL FAM COM: n/a  Prior to Admission Living Arrangement: Home Anticipated Discharge Location: Home Barriers to Discharge: fluid status monitoring, AKI Dispo: ~2-4 days  Elige Radon, MD Internal Medicine Resident PGY-2 [facility] Pager: [phone] 04/12/2020 7:00 AM    After 5pm on weekdays and 1pm on weekends: On Call pager [phone]

## 2020-04-13 LAB — GLUCOSE, CAPILLARY
Glucose-Capillary: 117 mg/dL — ABNORMAL HIGH (ref 70–99)
Glucose-Capillary: 120 mg/dL — ABNORMAL HIGH (ref 70–99)
Glucose-Capillary: 127 mg/dL — ABNORMAL HIGH (ref 70–99)
Glucose-Capillary: 128 mg/dL — ABNORMAL HIGH (ref 70–99)
Glucose-Capillary: 157 mg/dL — ABNORMAL HIGH (ref 70–99)

## 2020-04-13 LAB — BASIC METABOLIC PANEL
Anion gap: 11 (ref 5–15)
BUN: 32 mg/dL — ABNORMAL HIGH (ref 6–20)
CO2: 35 mmol/L — ABNORMAL HIGH (ref 22–32)
Calcium: 9.2 mg/dL (ref 8.9–10.3)
Chloride: 89 mmol/L — ABNORMAL LOW (ref 98–111)
Creatinine, Ser: 1.64 mg/dL — ABNORMAL HIGH (ref 0.61–1.24)
GFR, Estimated: 52 mL/min — ABNORMAL LOW (ref >60)
Glucose, Bld: 117 mg/dL — ABNORMAL HIGH (ref 70–99)
Potassium: 4.2 mmol/L (ref 3.5–5.1)
Sodium: 135 mmol/L (ref 135–145)

## 2020-04-13 LAB — CBC
HCT: 60.2 % — ABNORMAL HIGH (ref 39.0–52.0)
Hemoglobin: 19.8 g/dL — ABNORMAL HIGH (ref 13.0–17.0)
MCH: 28.7 pg (ref 26.0–34.0)
MCHC: 32.9 g/dL (ref 30.0–36.0)
MCV: 87.1 fL (ref 80.0–100.0)
Platelets: 178 10*3/uL (ref 150–400)
RBC: 6.91 MIL/uL — ABNORMAL HIGH (ref 4.22–5.81)
RDW: 17.7 % — ABNORMAL HIGH (ref 11.5–15.5)
WBC: 5.1 10*3/uL (ref 4.0–10.5)
nRBC: 0 % (ref 0.0–0.2)

## 2020-04-13 LAB — MAGNESIUM: Magnesium: 2.1 mg/dL (ref 1.7–2.4)

## 2020-04-13 NOTE — Progress Notes (Signed)
Transitions of Care Pharmacist Note  Gregory Mckenzie is a 45 y.o. male that has been diagnosed with A Fib and will be prescribed Xarelto (rivaroxaban) at discharge.   Patient Education: I provided the following education on Xarelto to the patient: How to take the medication Described what the medication is Signs of bleeding Signs/symptoms of VTE and stroke  Answered their questions  Discharge Medications Plan: The patient wants to have their discharge medications filled by the Transitions of Care pharmacy rather than their usual pharmacy.  The discharge orders pharmacy has already been changed to the Transitions of Care pharmacy, the patient will receive a phone call regarding co-pay, and their medications will be delivered by the Transitions of Care pharmacy.    Thank you,   Laverna Peace, PharmD PGY-1 Pharmacy Resident April 13, 2020

## 2020-04-13 NOTE — Progress Notes (Signed)
Subjective:  Patient evaluated at bedside this AM. Mr. Corella states that he slept very well overnight. He denies any CP, pressure, LE swelling, and has a good appetite. He notes significant troubles with sleep apnea (snoring, PND) although denies any known history of MI. He expresses frustration that he continues to require hospitalization, noting family stressors at home.   Objective:  Vital signs in last 24 hours: Vitals:   04/12/20 0818 04/12/20 0924 04/12/20 1934 04/13/20 0510  BP: 105/69 108/62 103/69 114/66  Pulse: 73  92 88  Resp:  19 20 20   Temp:  97.8 F (36.6 C) 98.3 F (36.8 C) 97.8 F (36.6 C)  TempSrc:  Oral Oral Oral  SpO2:  92% 93% 98%  Weight:      Height:       Physical Exam: General: No acute distress, sitting on side of bed eating breakfast CV: Regular rate, rhythm. No murmurs, rubs, gallops appreciated Pulm: Clear to auscultation bilaterally. No wheezing, rales, rhonchi. MSK: 1+ pitting edema to mid-tibia bilaterally.   Assessment/Plan: Mr. Brixon Zhen is 44yo person with no known prior medical history admitted 2/22 for acute hypoxic respiratory failure, found to be in acute decompensated heart failure with mid-range EF, continuing ischemic evaluation per cardiology.  Principal Problem:   Acute combined systolic and diastolic heart failure (HCC) Active Problems:   Shortness of breath   Acute respiratory failure with hypoxia (HCC)   Anasarca   Bilateral lower extremity edema   Hypertension   Type 2 diabetes mellitus with complication, without long-term current use of insulin (HCC)   Continuous dependence on cigarette smoking   Marijuana use   Class 3 severe obesity due to excess calories with serious comorbidity and body mass index (BMI) of 45.0 to 49.9 in adult [facility])   Paroxysmal atrial flutter (HCC)   Cardiomyopathy (HCC)  #Acute decompensated heart failure w/ mid-range EF (EF40-45%) Overnight, hemodynamically stable on 4L supp O2. This morning in  the room, patient sating well on room air. Diuresis stopped yesterday due to renal function, titrating GDMT as tolerated. Ischemic evaluation with nuclear stress test over the weekend revealed extensive wall motion abnormalities involving basilar aspect of left inferior ventricular wall. Likely will need further evaluation with catheterization. Will discuss with cardiology for inpatient vs outpatient cath, as patient eager to get home. Appreciate TOC assistance in obtaining medications as outpatient. He has an appointment with the HF Clinic this upcoming Friday, where he will meet with multidisciplinary team. Will plan to follow-up with [facility] as well. Appreciate cardiology recommendations - Holding further diuresis pending cariology evaluation - C/w entresto, coreg - Daily Mg, BMP - Strict I&O's - TED hose - Outpatient sleep study  #Paroxysmal atrial flutter CHADSVASC 3. Rate controlled with coreg. Tele reviewed this morning, HR controlled overnight. Likely atrial flutter d/t sleep apnea, but can consider referral to EP at discharge to assess need for ablation. - C/w Xarelto, coreg  #Acute kidney injury sCr 1.6<1.9, GFR 52<43 after holding diuresis yesterday. Baseline sCr 1.3, GFR >60. Holding diuresis now, can discuss with cardiology. - Daily BMP - Strict I&O's - Avoid nephrotoxic meds  #Polycythemia Hgb 19.8<20. Most likely due to diuresis and hypoxia related to chronic tobacco use and likely sleep apnea. EPO level normal, possibly inappropriately normal given chronic hypoxia. Will pursue JAK2 testing as well to check for polycythemia vera. - Daily CBC - F/u JAK2 testing  #Type II diabetes mellitus CBG's appropriate, no acute changes.  - SSI - CBG monitoring  QID  DIET: HH IVF: n/a DVT PPX: therapeutic Xarelto BOWEL: Miralax PRN CODE: FULL FAM COM: Patient had his wife on the phone while team was in the room this morning.  Prior to Admission Living Arrangement:  Home Anticipated Discharge Location: Home Barriers to Discharge: medical management Dispo: Anticipated discharge in approximately 0-2 day(s).   Evlyn Kanner, MD 04/13/2020, 6:12 AM Pager: [phone] After 5pm on weekdays and 1pm on weekends: On Call pager [phone]

## 2020-04-13 NOTE — Progress Notes (Signed)
Heart Failure Stewardship Pharmacist Progress Note   PCP: Pcp, No PCP-Cardiologist: No primary care provider on file.    HPI:  45 yo M with no significant prior medical history. He presented to the ED on 04/07/20 with shortness of breath and LE edema. An ECHO was done on 04/08/20 and LVEF was 40-45%. Stress test done on 04/11/20 and found to have no evidence of reversible ischemia and LVEF 31%.   Current HF Medications: Carvedilol 12.5 mg BID Entresto 49/51 mg BID  Prior to admission HF Medications: None  Pertinent Lab Values: . Serum creatinine 1.64, BUN 32, Potassium 4.2, Sodium 135, BNP 1576.7, Magnesium 2.1  Vital Signs: . Weight: 269 lbs (admission weight: 273 lbs) . Blood pressure: 110/60s  . Heart rate: 80-90s   Medication Assistance / Insurance Benefits Check: Does the patient have prescription insurance?  No  Does the patient qualify for medication assistance through manufacturers or grants?   Pending . Eligible grants and/or patient assistance programs: Entresto, Xarelto . Medication assistance applications in progress: none, pending final medication regimen  . Medication assistance applications approved: none Approved medication assistance renewals will be completed by: TBD  Outpatient Pharmacy:   Prior to admission outpatient pharmacy: [facility] Is the patient willing to use [facility] TOC pharmacy at discharge? Yes Is the patient willing to transition their outpatient pharmacy to utilize a [facility] outpatient pharmacy?   Pending    Assessment: 1. Acute on chronic systolic CHF (EF 24-40%), pending ischemic evaluation. NYHA class II/III symptoms. - Continue carvedilol 12.5 mg BID - Continue Entresto 49/51 mg BID - Consider starting spironolactone and Farxiga prior to discharge pending SCr trends   Plan: 1) Medication changes recommended at this time: - Continue current regimen; pending further improvement in SCr   2) Patient assistance application(s): - None  pending - Can help assist with enrolling in patient assistance programs for Eual Fines, and Farxiga (if initiated)  3)  Education  - To be completed prior to discharge  Sharen Hones, PharmD, BCPS Heart Failure Engineer, building services Phone [phone]

## 2020-04-13 NOTE — TOC Progression Note (Addendum)
HV Transition of Care [facility]) - Progression Note   Patient Details  Name: Gregory Mckenzie MRN: [id number] Date of Birth: 12/20/1975  Transition of Care [facility]) CM/SW Contact  Niki Cosman, LCSWA Phone Number: 04/13/2020, 8:43 AM  Clinical Narrative:    CSW and RN HF Navigator spoke with patient about the HV TOC and bridging the gap between inpatient and outpatient care. CSW and RN Navigator reviewed patients SDOH and immediate needs. Patient reports not having health insurance and CSW provided patient with CAFA application and required documents and encouraged [name] to bring the form filled out and required documents to his outpatient appointment with the HF clinic with him on March 4th and patient agreed.    Expected Discharge Plan: Home/Self Care Barriers to Discharge: Continued Medical Work up  Expected Discharge Plan and Services Expected Discharge Plan: Home/Self Care In-house Referral: Clinical Social Work   Post Acute Care Choice: NA Living arrangements for the past 2 months: Single Family Home                                       Social Determinants of Health (SDOH) Interventions Food Insecurity Interventions: Intervention Not Indicated Financial Strain Interventions: Other (Comment) (HF CSW) Housing Interventions: Intervention Not Indicated Transportation Interventions: Intervention Not Indicated Alcohol Brief Interventions/Follow-up: AUDIT Score <7 follow-up not indicated  Readmission Risk Interventions Readmission Risk Prevention Plan 04/10/2020  Post Dischage Appt Not Complete  Appt Comments First available appt at [facility] is 3/24.  Transportation Screening Complete

## 2020-04-14 ENCOUNTER — Other Ambulatory Visit: Payer: Self-pay | Admitting: Student

## 2020-04-14 DIAGNOSIS — I480 Paroxysmal atrial fibrillation: Secondary | ICD-10-CM

## 2020-04-14 DIAGNOSIS — I429 Cardiomyopathy, unspecified: Secondary | ICD-10-CM

## 2020-04-14 DIAGNOSIS — Z6841 Body Mass Index (BMI) 40.0 and over, adult: Secondary | ICD-10-CM

## 2020-04-14 DIAGNOSIS — N179 Acute kidney failure, unspecified: Secondary | ICD-10-CM

## 2020-04-14 DIAGNOSIS — F1290 Cannabis use, unspecified, uncomplicated: Secondary | ICD-10-CM

## 2020-04-14 LAB — CBC
HCT: 60.9 % — ABNORMAL HIGH (ref 39.0–52.0)
Hemoglobin: 19.5 g/dL — ABNORMAL HIGH (ref 13.0–17.0)
MCH: 27.9 pg (ref 26.0–34.0)
MCHC: 32 g/dL (ref 30.0–36.0)
MCV: 87.1 fL (ref 80.0–100.0)
Platelets: 165 10*3/uL (ref 150–400)
RBC: 6.99 MIL/uL — ABNORMAL HIGH (ref 4.22–5.81)
RDW: 17.5 % — ABNORMAL HIGH (ref 11.5–15.5)
WBC: 4.8 10*3/uL (ref 4.0–10.5)
nRBC: 0 % (ref 0.0–0.2)

## 2020-04-14 LAB — GLUCOSE, CAPILLARY
Glucose-Capillary: 149 mg/dL — ABNORMAL HIGH (ref 70–99)
Glucose-Capillary: 145 mg/dL — ABNORMAL HIGH (ref 70–99)

## 2020-04-14 LAB — MAGNESIUM: Magnesium: 2.2 mg/dL (ref 1.7–2.4)

## 2020-04-14 LAB — BASIC METABOLIC PANEL
Anion gap: 9 (ref 5–15)
BUN: 28 mg/dL — ABNORMAL HIGH (ref 6–20)
CO2: 34 mmol/L — ABNORMAL HIGH (ref 22–32)
Calcium: 9.2 mg/dL (ref 8.9–10.3)
Chloride: 90 mmol/L — ABNORMAL LOW (ref 98–111)
Creatinine, Ser: 1.58 mg/dL — ABNORMAL HIGH (ref 0.61–1.24)
GFR, Estimated: 54 mL/min — ABNORMAL LOW (ref >60)
Glucose, Bld: 131 mg/dL — ABNORMAL HIGH (ref 70–99)
Potassium: 3.8 mmol/L (ref 3.5–5.1)
Sodium: 133 mmol/L — ABNORMAL LOW (ref 135–145)

## 2020-04-14 MED ORDER — POTASSIUM CHLORIDE CRYS ER 20 MEQ PO TBCR
20.0000 meq | EXTENDED_RELEASE_TABLET | Freq: Two times a day (BID) | ORAL | Status: DC
  Administered 2020-04-14: 20 meq via ORAL
  Filled 2020-04-14: qty 1

## 2020-04-14 MED ORDER — SACUBITRIL-VALSARTAN 49-51 MG PO TABS: 1.0000 | ORAL_TABLET | Freq: Two times a day (BID) | ORAL | 0 refills | Status: DC

## 2020-04-14 MED ORDER — SPIRONOLACTONE 12.5 MG HALF TABLET
12.5000 mg | ORAL_TABLET | Freq: Every day | ORAL | Status: DC
  Administered 2020-04-14: 12.5 mg via ORAL
  Filled 2020-04-14: qty 1

## 2020-04-14 MED ORDER — SPIRONOLACTONE 25 MG PO TABS: 12.5000 mg | ORAL_TABLET | Freq: Every day | ORAL | 0 refills | Status: DC

## 2020-04-14 MED ORDER — FUROSEMIDE 20 MG PO TABS: 20.0000 mg | ORAL_TABLET | Freq: Every day | ORAL | 11 refills | Status: DC

## 2020-04-14 MED ORDER — CARVEDILOL 12.5 MG PO TABS: 12.5000 mg | ORAL_TABLET | Freq: Two times a day (BID) | ORAL | 0 refills | Status: DC

## 2020-04-14 MED ORDER — RIVAROXABAN 20 MG PO TABS: 20.0000 mg | ORAL_TABLET | Freq: Every day | ORAL | 0 refills | Status: DC

## 2020-04-14 MED ORDER — ATORVASTATIN CALCIUM 40 MG PO TABS: 40.0000 mg | ORAL_TABLET | Freq: Every day | ORAL | 0 refills | Status: DC

## 2020-04-14 NOTE — Discharge Summary (Signed)
Name: Gregory Mckenzie MRN: [id number] DOB: Jan 08, 1976 45 y.o. PCP: Pcp, No  Date of Admission: 04/07/2020 11:40 AM Date of Discharge:  04/14/20 Attending Physician: Dr. Criselda Peaches  Discharge Diagnosis: Principal Problem:   Acute combined systolic and diastolic heart failure (HCC) Active Problems:   Shortness of breath   Acute respiratory failure with hypoxia (HCC)   Anasarca   Bilateral lower extremity edema   Hypertension   Type 2 diabetes mellitus with complication, without long-term current use of insulin (HCC)   Continuous dependence on cigarette smoking   Marijuana use   Class 3 severe obesity due to excess calories with serious comorbidity and body mass index (BMI) of 45.0 to 49.9 in adult [facility])   Paroxysmal atrial flutter (HCC)   Cardiomyopathy (HCC)    Discharge Medications: Allergies as of 04/14/2020   No Known Allergies     Medication List    TAKE these medications   atorvastatin 40 MG tablet Commonly known as: LIPITOR Take 1 tablet (40 mg total) by mouth at bedtime.   carvedilol 12.5 MG tablet Commonly known as: COREG Take 1 tablet (12.5 mg total) by mouth 2 (two) times daily with a meal.   furosemide 20 MG tablet Commonly known as: Lasix Take 1 tablet (20 mg total) by mouth daily.   rivaroxaban 20 MG Tabs tablet Commonly known as: XARELTO Take 1 tablet (20 mg total) by mouth daily with supper.   sacubitril-valsartan 49-51 MG Commonly known as: ENTRESTO Take 1 tablet by mouth 2 (two) times daily.   spironolactone 25 MG tablet Commonly known as: ALDACTONE Take 0.5 tablets (12.5 mg total) by mouth daily. Start taking on: April 15, 2020       Disposition and follow-up:   [name] was discharged from [facility] in Stable condition.  At the hospital follow up visit please address:  1.  Follow-up:  A. Acute Combined HF - New Dx, following up with cardiology    B. Atrial Fib/Flutter - Started on Xarelto. Cards to follow   C. New  Dx T2DM - outpatient clinic to initiate medications. Started on statin   D. Polycythemia - Jak2 labs pending   E. Acute Kidney Injury - Unsure baseline, repeat BMP   F. Suspected OSA - Needs sleep study   2.  Labs / imaging needed at time of follow-up: BMP, CBC, A1c, Sleep study for OSA  3.  Pending labs/ test needing follow-up: JAK2  4.  Medication Changes (doses as per above)  Started: Spironolactone, Entresto, Carvedilol, Atorvastatin, Xarelto, Lasix  Stopped: None  Abx - None   Follow-up Appointments:  Follow-up Information    [facility]. Go on 05/07/2020.   Why: Please attend your appointment on Thursday, 05/07/20 at 1:30pm with Georgian Co, PA.   Contact information: [name] [place] [address] [phone]       [facility]. Go on 04/17/2020.   Specialty: Cardiology Why: at 1pm. (Heart Impact TOC) Bring all medications with you. you will see a HF provider, pharmacist, and social worker. Appt will be 1 hour long. FREE valet parking ENTRANCE C off [address]. Contact information: [address] [id number] mc [place] [address] [phone]             Hospital Course by problem list:  A. Acute Combined Systolic/Diastolic HF - Patient presented with worsening shortness of breath requiring new O2 supplementation (3L) and lower extremity swelling.  Exam was consistent with HF, bilateral pulmonary crackles, lower extremity edema. CXR consistent with pulmonary edema. TTE performed EF 40-45%, severe concentric LVH, grade II diastolic dysfunction, severely dilated RA/LA consistent with combined HF. Diuresed well on bumex,metalozone however patient had elevation in Cr so discontinued diuretics. Uncertain of patient's baseline Cr as he has not seen provider in some time. Cardiology consulted and will follow up with patient on outpatient basis, did not  believe patient needed urgent LHC. Lexiscan performed that was reassuring. Started on carvedilol, entresto, spironolactone, and furosemide on discharge with assistance from HF pharmacist and TOC.    B. Atrial Fib/Flutter - Patient found to have intermittent episodes of of a.fib/flutter on telemetry. CHADSVASC 3, 3.2% stroke risk per year. Patient started on Xarelto. Patient with cardiology follow up, consider EP follow up and possible monitor to evaluate burden.   C. New Dx T2DM - Patient with A1c of 6.7%, patient denies history of DM. New diagnosis, suspected to be type 2 in nature. Will recommend patient follow up with outpatient clinic for management. With new onset HF, may benefit from metform and SGLT-2.   D. Polycythemia - Hgb elevated in the 20's during admission. Patient has multiple risk factors that may lead to polycythema (smoking hx, suspected OSA). Jak2 labs ordered and pending at discharge. Will need further outpatient work up.   E. Acute Kidney Injury - Patient presented with acute kidney injury, uncertain of patient's baseline kidney function as he has not seen a doctor in some time. Worsened with diuretics and improved once stopped. Will start back on low dose lasix 20 mg daily and have patient follow up with clinic on outpatient basis.  F. Suspected OSA - Patient endorsed his partner saying that during his sleep he snores loudly and she frequently asks if he is okay. Suspected OSA in setting of history and obesity.   Discharge Subjective: Patient has no concerns this morning upon examination. He denies shortness of breath. Endorses improvement of lower extremity swelling. Endorses his wife stating patient snores quite frequently, never had sleep study.   Discussed with him his new medications including his blood thinner. Talked with him about risks and needing to be careful as he is at an increased risk for bleeding.  Discharge Exam:   BP (!) 114/100 (BP Location: Right Wrist)    Pulse 83   Temp 99 F (37.2 C)   Resp 16   Ht 5\' 4"  (1.626 m)   Wt 117.3 kg   SpO2 95%   BMI 44.39 kg/m  Constitutional: well-appearing, resting comfortably in bed HENT: normocephalic atraumatic Eyes: conjunctiva non-erythematous Neck: supple Cardiovascular: regular rate and rhythm, no m/r/g Pulmonary/Chest: normal work of breathing on room air MSK: normal bulk and tone Neurological: alert & oriented x 3 Skin: warm and dry. 1+ lower extremity edema Psych: Normal mood   Pertinent Labs, Studies, and Procedures:  CBC Latest Ref Rng & Units 04/14/2020 04/13/2020 04/12/2020  WBC 4.0 - 10.5 K/uL 4.8 5.1 6.2  Hemoglobin 13.0 - 17.0 g/dL 19.5(H) 19.8(H) 20.0(H)  Hematocrit 39.0 - 52.0 % 60.9(H) 60.2(H) 61.7(H)  Platelets 150 - 400 K/uL 165 178 178    CMP Latest Ref Rng & Units 04/14/2020 04/13/2020 04/12/2020  Glucose 70 - 99 mg/dL 04/14/2020) 098(J) 191(Y)  BUN 6 - 20 mg/dL 782(N) 56(O) 13(Y)  Creatinine 0.61 - 1.24 mg/dL 86(V) 7.84(O) 9.62(X)  Sodium 135 - 145 mmol/L 133(L) 135 135  Potassium 3.5 - 5.1 mmol/L 3.8 4.2 4.0  Chloride 98 -  111 mmol/L 90(L) 89(L) 88(L)  CO2 22 - 32 mmol/L 34(H) 35(H) 34(H)  Calcium 8.9 - 10.3 mg/dL 9.2 9.2 9.2  Total Protein 6.5 - 8.1 g/dL - - -  Total Bilirubin 0.3 - 1.2 mg/dL - - -  Alkaline Phos 38 - 126 U/L - - -  AST 15 - 41 U/L - - -  ALT 0 - 44 U/L - - -    DG Chest 2 View  Result Date: 04/07/2020 CLINICAL DATA:  Chest pain and shortness of breath EXAM: CHEST - 2 VIEW COMPARISON:  None. FINDINGS: Cardiomegaly with cardiomegaly with central vascular prominence. Bibasilar interstitial opacities. No pleural effusion or pneumothorax. The visualized skeletal structures are unremarkable. IMPRESSION: Cardiomegaly with central vascular prominence and bibasilar interstitial opacities, likely pulmonary edema. Electronically Signed   By: Maudry MayhewJeffrey  Waltz MD   On: 04/07/2020 12:55   ECHOCARDIOGRAM COMPLETE  Result Date: 04/08/2020    ECHOCARDIOGRAM REPORT    Patient Name:   Gregory SabalKEVIN Demeter Date of Exam: 04/08/2020 Medical Rec #:  [id number]   Height:       64.0 in Accession #:    [id number]  Weight:       250.0 lb Date of Birth:  04-May-1975   BSA:          2.151 m Patient Age:    44 years    BP:           145/90 mmHg Patient Gender: M           HR:           103 bpm. Exam Location:  Inpatient Procedure: 2D Echo, Cardiac Doppler and Color Doppler Indications:    Dyspnea  History:        Patient has no prior history of Echocardiogram examinations.                 Signs/Symptoms:Dyspnea and LE edema; Risk Factors:Current Smoker                 and Obesity. Resp. failure.  Sonographer:    Lavenia AtlasBrooke Strickland Referring Phys: [phone] ELIZABETH REES IMPRESSIONS  1. Left ventricular ejection fraction, by estimation, is 40 to 45%. The left ventricle has mildly decreased function. The left ventricle demonstrates global hypokinesis. There is severe concentric left ventricular hypertrophy. Left ventricular diastolic  parameters are consistent with Grade II diastolic dysfunction (pseudonormalization). Elevated left atrial pressure.  2. Right ventricular systolic function was not well visualized. The right ventricular size is mildly enlarged. There is normal pulmonary artery systolic pressure.  3. Left atrial size was severely dilated.  4. Right atrial size was severely dilated.  5. The mitral valve is normal in structure. Trivial mitral valve regurgitation. No evidence of mitral stenosis.  6. The aortic valve is normal in structure. Aortic valve regurgitation is not visualized. No aortic stenosis is present.  7. The inferior vena cava is dilated in size with >50% respiratory variability, suggesting right atrial pressure of 8 mmHg. FINDINGS  Left Ventricle: Left ventricular ejection fraction, by estimation, is 40 to 45%. The left ventricle has mildly decreased function. The left ventricle demonstrates global hypokinesis. The left ventricular internal cavity size was normal in size. There is   severe concentric left ventricular hypertrophy. Left ventricular diastolic parameters are consistent with Grade II diastolic dysfunction (pseudonormalization). Elevated left atrial pressure. Right Ventricle: The right ventricular size is mildly enlarged. No increase in right ventricular wall thickness. Right ventricular systolic function was not well visualized. There is  normal pulmonary artery systolic pressure. The tricuspid regurgitant velocity is 2.16 m/s, and with an assumed right atrial pressure of 8 mmHg, the estimated right ventricular systolic pressure is 26.7 mmHg. Left Atrium: Left atrial size was severely dilated. Right Atrium: Right atrial size was severely dilated. Pericardium: There is no evidence of pericardial effusion. Mitral Valve: The mitral valve is normal in structure. Trivial mitral valve regurgitation. No evidence of mitral valve stenosis. Tricuspid Valve: The tricuspid valve is normal in structure. Tricuspid valve regurgitation is trivial. No evidence of tricuspid stenosis. Aortic Valve: The aortic valve is normal in structure. Aortic valve regurgitation is not visualized. No aortic stenosis is present. Pulmonic Valve: The pulmonic valve was normal in structure. Pulmonic valve regurgitation is not visualized. No evidence of pulmonic stenosis. Aorta: The aortic root is normal in size and structure. Venous: The inferior vena cava is dilated in size with greater than 50% respiratory variability, suggesting right atrial pressure of 8 mmHg. IAS/Shunts: No atrial level shunt detected by color flow Doppler.  LEFT VENTRICLE PLAX 2D LVIDd:         5.40 cm  Diastology LVIDs:         4.50 cm  LV e' medial:    6.20 cm/s LV PW:         1.70 cm  LV E/e' medial:  13.5 LV IVS:        1.80 cm  LV e' lateral:   7.18 cm/s LVOT diam:     2.10 cm  LV E/e' lateral: 11.7 LV SV:         45 LV SV Index:   21 LVOT Area:     3.46 cm  RIGHT VENTRICLE RV Basal diam:  4.40 cm RV S prime:     10.80 cm/s TAPSE (M-mode):  3.0 cm LEFT ATRIUM              Index       RIGHT ATRIUM           Index LA diam:        4.80 cm  2.23 cm/m  RA Area:     28.20 cm LA Vol (A2C):   116.0 ml 53.93 ml/m RA Volume:   105.00 ml 48.81 ml/m LA Vol (A4C):   111.0 ml 51.60 ml/m LA Biplane Vol: 114.0 ml 53.00 ml/m  AORTIC VALVE LVOT Vmax:   92.00 cm/s LVOT Vmean:  65.900 cm/s LVOT VTI:    0.131 m  AORTA Ao Root diam: 2.90 cm MITRAL VALVE                TRICUSPID VALVE MV Area (PHT): 14.31 cm    TR Peak grad:   18.7 mmHg MV Decel Time: 53 msec      TR Vmax:        216.00 cm/s MV E velocity: 84.00 cm/s MV A velocity: 101.00 cm/s  SHUNTS MV E/A ratio:  0.83         Systemic VTI:  0.13 m                             Systemic Diam: 2.10 cm Armanda Magic MD Electronically signed by Armanda Magic MD Signature Date/Time: 04/08/2020/12:49:42 PM    Final      Discharge Instructions: Discharge Instructions    (HEART FAILURE PATIENTS) Call MD:  Anytime you have any of the following symptoms: 1) 3 pound weight gain in 24 hours or 5  pounds in 1 week 2) shortness of breath, with or without a dry hacking cough 3) swelling in the hands, feet or stomach 4) if you have to sleep on extra pillows at night in order to breathe.   Complete by: As directed    Call MD for:  difficulty breathing, headache or visual disturbances   Complete by: As directed    Call MD for:  extreme fatigue   Complete by: As directed    Call MD for:  persistant dizziness or light-headedness   Complete by: As directed    Call MD for:  redness, tenderness, or signs of infection (pain, swelling, redness, odor or green/yellow discharge around incision site)   Complete by: As directed    Call MD for:  temperature >100.4   Complete by: As directed    Diet - low sodium heart healthy   Complete by: As directed    Increase activity slowly   Complete by: As directed       Signed: Belva Agee, MD 04/14/2020, 2:29 PM   Pager: [phone]

## 2020-04-14 NOTE — Progress Notes (Signed)
Heart Failure Stewardship Pharmacist Progress Note   PCP: Pcp, No PCP-Cardiologist: No primary care provider on file.    HPI:  45 yo M with no significant prior medical history. He presented to the ED on 04/07/20 with shortness of breath and LE edema. An ECHO was done on 04/08/20 and LVEF was 40-45%. Stress test done on 04/11/20 and found to have no evidence of reversible ischemia and LVEF 31%.   Current HF Medications: Carvedilol 12.5 mg BID Entresto 49/51 mg BID  Prior to admission HF Medications: None  Pertinent Lab Values: . Serum creatinine 1.58, BUN 28, Potassium 3.8, Sodium 133, BNP 1576.7, Magnesium 2.2  Vital Signs: . Weight: 258 lbs (admission weight: 273 lbs) . Blood pressure: 120/70s  . Heart rate: 70-90s   Medication Assistance / Insurance Benefits Check: Does the patient have prescription insurance?  No  Does the patient qualify for medication assistance through manufacturers or grants?   Pending . Eligible grants and/or patient assistance programs: Entresto, Xarelto . Medication assistance applications in progress: none, pending final medication regimen  . Medication assistance applications approved: none Approved medication assistance renewals will be completed by: TBD  Outpatient Pharmacy:   Prior to admission outpatient pharmacy: [facility] Is the patient willing to use [facility] TOC pharmacy at discharge? Yes Is the patient willing to transition their outpatient pharmacy to utilize a [facility] outpatient pharmacy?   Pending    Assessment: 1. Acute on chronic systolic CHF (EF 67-01%), due to NICM. NYHA class II/III symptoms. - Continue carvedilol 12.5 mg BID - Continue Entresto 49/51 mg BID - Consider starting spironolactone and Farxiga prior to discharge pending SCr trends   Plan: 1) Medication changes recommended at this time: - Add spironolactone 12.5 mg daily today   2) Patient assistance application(s): - None pending - Can help assist with enrolling  in patient assistance programs for Eual Fines, and Farxiga (if initiated)  3)  Education  - To be completed prior to discharge  Sharen Hones, PharmD, BCPS Heart Failure Engineer, building services Phone [phone]

## 2020-04-14 NOTE — Plan of Care (Signed)
  Problem: Education: Goal: Knowledge of General Education information will improve Description: Including pain rating scale, medication(s)/side effects and non-pharmacologic comfort measures 04/14/2020 1453 by Reino Kent, RN Outcome: Adequate for Discharge 04/14/2020 1452 by Reino Kent, RN Outcome: Adequate for Discharge   Problem: Health Behavior/Discharge Planning: Goal: Ability to manage health-related needs will improve 04/14/2020 1453 by Reino Kent, RN Outcome: Adequate for Discharge 04/14/2020 1452 by Reino Kent, RN Outcome: Adequate for Discharge   Problem: Clinical Measurements: Goal: Ability to maintain clinical measurements within normal limits will improve 04/14/2020 1453 by Reino Kent, RN Outcome: Adequate for Discharge 04/14/2020 1452 by Reino Kent, RN Outcome: Adequate for Discharge Goal: Will remain free from infection 04/14/2020 1453 by Reino Kent, RN Outcome: Adequate for Discharge 04/14/2020 1452 by Reino Kent, RN Outcome: Adequate for Discharge Goal: Diagnostic test results will improve 04/14/2020 1453 by Reino Kent, RN Outcome: Adequate for Discharge 04/14/2020 1452 by Reino Kent, RN Outcome: Adequate for Discharge Goal: Respiratory complications will improve 04/14/2020 1453 by Reino Kent, RN Outcome: Adequate for Discharge 04/14/2020 1452 by Reino Kent, RN Outcome: Adequate for Discharge Goal: Cardiovascular complication will be avoided 04/14/2020 1453 by Reino Kent, RN Outcome: Adequate for Discharge 04/14/2020 1452 by Reino Kent, RN Outcome: Adequate for Discharge   Problem: Activity: Goal: Risk for activity intolerance will decrease 04/14/2020 1453 by Reino Kent, RN Outcome: Adequate for Discharge 04/14/2020 1452 by Reino Kent, RN Outcome: Adequate for Discharge   Problem: Nutrition: Goal: Adequate nutrition will be maintained 04/14/2020 1453 by Reino Kent, RN Outcome: Adequate for Discharge 04/14/2020 1452 by Reino Kent, RN Outcome: Adequate for Discharge   Problem: Coping: Goal: Level  of anxiety will decrease 04/14/2020 1453 by Reino Kent, RN Outcome: Adequate for Discharge 04/14/2020 1452 by Reino Kent, RN Outcome: Adequate for Discharge   Problem: Elimination: Goal: Will not experience complications related to bowel motility 04/14/2020 1453 by Reino Kent, RN Outcome: Adequate for Discharge 04/14/2020 1452 by Reino Kent, RN Outcome: Adequate for Discharge Goal: Will not experience complications related to urinary retention 04/14/2020 1453 by Reino Kent, RN Outcome: Adequate for Discharge 04/14/2020 1452 by Reino Kent, RN Outcome: Adequate for Discharge   Problem: Pain Managment: Goal: General experience of comfort will improve 04/14/2020 1453 by Reino Kent, RN Outcome: Adequate for Discharge 04/14/2020 1452 by Reino Kent, RN Outcome: Adequate for Discharge   Problem: Safety: Goal: Ability to remain free from injury will improve 04/14/2020 1453 by Reino Kent, RN Outcome: Adequate for Discharge 04/14/2020 1452 by Reino Kent, RN Outcome: Adequate for Discharge   Problem: Skin Integrity: Goal: Risk for impaired skin integrity will decrease 04/14/2020 1453 by Reino Kent, RN Outcome: Adequate for Discharge 04/14/2020 1452 by Reino Kent, RN Outcome: Adequate for Discharge

## 2020-04-14 NOTE — Progress Notes (Signed)
ANTICOAGULATION CONSULT NOTE - follow up  Pharmacy Consult for rivaroxaban Indication: atrial fibrillation  No Known Allergies  Patient Measurements: Height: 5\' 4"  (162.6 cm) Weight: 117.3 kg (258 lb 9.6 oz) IBW/kg (Calculated) : 59.2   Vital Signs: Temp: 99 F (37.2 C) (03/01 0243) BP: 124/101 (03/01 0243) Pulse Rate: 82 (03/01 0243)  Labs: Recent Labs    04/12/20 0137 04/13/20 0343 04/14/20 0232  HGB 20.0* 19.8* 19.5*  HCT 61.7* 60.2* 60.9*  PLT 178 178 165  CREATININE 1.92* 1.64* 1.58*    Estimated Creatinine Clearance: 68.1 mL/min (A) (by C-G formula based on SCr of 1.58 mg/dL (H)).   Medical History: Past Medical History:  Diagnosis Date   Hypertension     Assessment: 44yom admitted with volume overload and new heart failure.  Patient developed runs Afib/flutter. Rivaroxaban 20mg  daily was started on 04/11/20 for anticoagulation for stroke preventions.    SCr down to 1.58, Crcl >59ml/min.   H/H 19.5/60.9 stable. and pltc are within normal and stable. No bleeding reported. Patient education for rivaroxaban complted 04/13/20   Goal of Therapy:  Monitor platelets by anticoagulation protocol: Yes   Plan:  Continue Rivaroxaban 20mg  daily with evening meal Monitor bleeding, cbc, renal funtion  06-24-1978, RPh Clinical Pharmacist Please check AMION for all [facility] Pharmacy phone numbers After 10:00 PM, call Main Pharmacy [phone]  04/14/2020 2:05 PM

## 2020-04-14 NOTE — Discharge Instructions (Signed)
Thank you for allowing [place] to care for you during your hospitalization. I can only imagine that this was a fairly overwhelming experience during your hospitalization and I hope we were able to answer all of your questions. Inevitably, more questions will come up and we have scheduled for you to be seen in our clinic. They will reach out to you to confirm and set a date for this appointment.   You will see the cardiologist on 04/17/20.   You were diagnosed with acute heart failure, atrial flutter/fibrillation (abnormal heart rhythm), acute kidney injury, and a new diagnosis of diabetes.  For you new diagnosis of heart failure we have started you on - Carvedilol, Spironolactone, and Entresto. For your abnormal heart rhythm that was seen during your diagnosis, we have started you on the blood thinner Xarelto. For the diabetes, we will hold off on starting you on metformin or insulin and have you follow up with the doctors in our clinic to initiate treatment and repeat the lab work. We have also started you on a cholesterol medicine called atorvastatin.    Information on my medicine - XARELTO (Rivaroxaban)  This medication education was reviewed with me or my healthcare representative as part of my discharge preparation.    Why was Xarelto prescribed for you? Xarelto was prescribed for you to reduce the risk of a blood clot forming that can cause a stroke if you have a medical condition called atrial fibrillation (a type of irregular heartbeat).  What do you need to know about xarelto ? Take your Xarelto ONCE DAILY at the same time every day with your evening meal. If you have difficulty swallowing the tablet whole, you may crush it and mix in applesauce just prior to taking your dose.  Take Xarelto exactly as prescribed by your doctor and DO NOT stop taking Xarelto without talking to the doctor who prescribed the medication.  Stopping without other stroke prevention medication to take the  place of Xarelto may increase your risk of developing a clot that causes a stroke.  Refill your prescription before you run out.  After discharge, you should have regular check-up appointments with your healthcare provider that is prescribing your Xarelto.  In the future your dose may need to be changed if your kidney function or weight changes by a significant amount.  What do you do if you miss a dose? If you are taking Xarelto ONCE DAILY and you miss a dose, take it as soon as you remember on the same day then continue your regularly scheduled once daily regimen the next day. Do not take two doses of Xarelto at the same time or on the same day.   Important Safety Information A possible side effect of Xarelto is bleeding. You should call your healthcare provider right away if you experience any of the following: ? Bleeding from an injury or your nose that does not stop. ? Unusual colored urine (red or dark brown) or unusual colored stools (red or black). ? Unusual bruising for unknown reasons. ? A serious fall or if you hit your head (even if there is no bleeding).  Some medicines may interact with Xarelto and might increase your risk of bleeding while on Xarelto. To help avoid this, consult your healthcare provider or pharmacist prior to using any new prescription or non-prescription medications, including herbals, vitamins, non-steroidal anti-inflammatory drugs (NSAIDs) and supplements.  This website has more information on Xarelto: VisitDestination.com.br.

## 2020-04-15 ENCOUNTER — Other Ambulatory Visit: Payer: Self-pay

## 2020-04-15 ENCOUNTER — Telehealth: Payer: Self-pay

## 2020-04-15 DIAGNOSIS — I429 Cardiomyopathy, unspecified: Secondary | ICD-10-CM

## 2020-04-15 DIAGNOSIS — I10 Essential (primary) hypertension: Secondary | ICD-10-CM

## 2020-04-15 DIAGNOSIS — I4892 Unspecified atrial flutter: Secondary | ICD-10-CM

## 2020-04-15 DIAGNOSIS — I5041 Acute combined systolic (congestive) and diastolic (congestive) heart failure: Secondary | ICD-10-CM

## 2020-04-15 NOTE — Telephone Encounter (Signed)
Location of hospitalization: [facility]  Reason for hospitalization: Swelling on his legs. Date of discharge: 04/14/20 Date of first communication with patient: today Person contacting patient: Gregory Mckenzie  Current symptoms: No  Do you understand why you were in the Hospital: Yes Questions regarding discharge instructions: None Where were you discharged to: Home Medications reviewed: Yes Allergies reviewed: Yes Dietary changes reviewed: Yes patient was told to follow a healthy diet Referals reviewed: NA Activities of Daily Living: No Any transportation issues/concerns: None Any patient concerns: None Confirmed importance & date/time of Follow up appt: Yes

## 2020-04-17 ENCOUNTER — Telehealth (HOSPITAL_COMMUNITY): Payer: Self-pay

## 2020-04-17 ENCOUNTER — Other Ambulatory Visit: Payer: Self-pay

## 2020-04-17 ENCOUNTER — Other Ambulatory Visit (HOSPITAL_COMMUNITY): Payer: Self-pay | Admitting: *Deleted

## 2020-04-17 ENCOUNTER — Other Ambulatory Visit (HOSPITAL_COMMUNITY): Payer: Self-pay | Admitting: Internal Medicine

## 2020-04-17 ENCOUNTER — Ambulatory Visit (HOSPITAL_COMMUNITY)
Admit: 2020-04-17 | Discharge: 2020-04-17 | Disposition: A | Discharge status: Home / Self Care | Payer: Self-pay | Attending: Internal Medicine | Admitting: Internal Medicine

## 2020-04-17 ENCOUNTER — Encounter (HOSPITAL_COMMUNITY): Payer: Self-pay

## 2020-04-17 ENCOUNTER — Telehealth (HOSPITAL_COMMUNITY): Payer: Self-pay | Admitting: Internal Medicine

## 2020-04-17 VITALS — BP 106/78 | HR 90 | Wt 259.4 lb

## 2020-04-17 DIAGNOSIS — E785 Hyperlipidemia, unspecified: Secondary | ICD-10-CM | POA: Insufficient documentation

## 2020-04-17 DIAGNOSIS — Z87891 Personal history of nicotine dependence: Secondary | ICD-10-CM | POA: Insufficient documentation

## 2020-04-17 DIAGNOSIS — D751 Secondary polycythemia: Secondary | ICD-10-CM | POA: Insufficient documentation

## 2020-04-17 DIAGNOSIS — F17200 Nicotine dependence, unspecified, uncomplicated: Secondary | ICD-10-CM

## 2020-04-17 DIAGNOSIS — Z79899 Other long term (current) drug therapy: Secondary | ICD-10-CM | POA: Insufficient documentation

## 2020-04-17 DIAGNOSIS — I130 Hypertensive heart and chronic kidney disease with heart failure and stage 1 through stage 4 chronic kidney disease, or unspecified chronic kidney disease: Secondary | ICD-10-CM | POA: Insufficient documentation

## 2020-04-17 DIAGNOSIS — Z7901 Long term (current) use of anticoagulants: Secondary | ICD-10-CM | POA: Insufficient documentation

## 2020-04-17 DIAGNOSIS — E669 Obesity, unspecified: Secondary | ICD-10-CM | POA: Insufficient documentation

## 2020-04-17 DIAGNOSIS — I5042 Chronic combined systolic (congestive) and diastolic (congestive) heart failure: Secondary | ICD-10-CM | POA: Insufficient documentation

## 2020-04-17 DIAGNOSIS — E1122 Type 2 diabetes mellitus with diabetic chronic kidney disease: Secondary | ICD-10-CM | POA: Insufficient documentation

## 2020-04-17 DIAGNOSIS — N1831 Chronic kidney disease, stage 3a: Secondary | ICD-10-CM | POA: Insufficient documentation

## 2020-04-17 DIAGNOSIS — I4892 Unspecified atrial flutter: Secondary | ICD-10-CM | POA: Insufficient documentation

## 2020-04-17 LAB — BASIC METABOLIC PANEL
Anion gap: 9 (ref 5–15)
BUN: 22 mg/dL — ABNORMAL HIGH (ref 6–20)
CO2: 28 mmol/L (ref 22–32)
Calcium: 9.5 mg/dL (ref 8.9–10.3)
Chloride: 99 mmol/L (ref 98–111)
Creatinine, Ser: 1.57 mg/dL — ABNORMAL HIGH (ref 0.61–1.24)
GFR, Estimated: 55 mL/min — ABNORMAL LOW (ref >60)
Glucose, Bld: 97 mg/dL (ref 70–99)
Potassium: 5 mmol/L (ref 3.5–5.1)
Sodium: 136 mmol/L (ref 135–145)

## 2020-04-17 MED ORDER — ASPIRIN EC 81 MG PO TBEC: 81.0000 mg | DELAYED_RELEASE_TABLET | Freq: Every day | ORAL | 3 refills | Status: DC

## 2020-04-17 MED ORDER — DAPAGLIFLOZIN PROPANEDIOL 10 MG PO TABS: 10.0000 mg | ORAL_TABLET | Freq: Every day | ORAL | 6 refills | Status: DC

## 2020-04-17 NOTE — Progress Notes (Signed)
[facility]  PCP: None  Primary Cardiologist: None  HPI:  Gregory Mckenzie is a 45 y.o.  male  with a PMH significant for Combined systolic and diastolic CHF, obesity, HLD, HTN, T2DM, Tobacco Use Disorder, CKD 3a  Patient never really followed with a doctor prior to admission. Beginning of February noted decreased exercise capacity, shortness of breath and significant weight gain of about 40-50lbs in just February.  Then he developed significant leg swelling.  He denies any chest pressure or pain but felt fatigued easily.    Admitted on 2/22 for symptoms above. Arrived severely hypertensive 180/100. Had an ECHO with EF 40-45%, G2DD ecg and troponin not suggestive of acute ischemia,.had a nuclear stress test which was high risk with wall motion abnormalities. Started on Entresto, spiro, carvedilol.   Diuresed around 7lbs down on discharge to 258lbs. Newly diagnosed with mild diabetes and labs consistent with polycythemia hct around 60%.  Additionally during his stay was found to have Paroxysmal A flutter and started on xarelto.    Smoking at least 1 and 1/2 packs per day since the age of 35.  Never drank alcohol. No illicit drug use only has marijuana occasionally.    HTN runs in his family, father and brother had severe hypertension.  Brother had a pulmonary embolism at age 6.  Thinks his brother also had "thick blood" polycythemia.    Feeling less fatigued at home, walking at home trying to walk a little further to the end of the street and back.  Has to stop and rest while doing this.  Doesn't have stairs.  Doing his ADL's without issue.  No chest pain.  Sleeps on his back mostly denies orthopnea.  Weighing himself at home 255, 254, 254 last three days.     ROS: All systems negative except as listed in HPI, PMH and Problem List.  SH:  Social History   Socioeconomic History  . Marital status: Single    Spouse name: Not on file  . Number of  children: Not on file  . Years of education: Not on file  . Highest education level: Not on file  Occupational History  . Not on file  Tobacco Use  . Smoking status: Former Smoker    Packs/day: 1.00    Years: 27.00    Pack years: 27.00    Types: Cigarettes    Quit date: 04/06/2020    Years since quitting: 0.0  . Smokeless tobacco: Never Used  Vaping Use  . Vaping Use: Never used  Substance and Sexual Activity  . Alcohol use: Not Currently  . Drug use: Yes    Frequency: 7.0 times per week    Types: Marijuana    Comment: daily  . Sexual activity: Yes    Partners: Female    Birth control/protection: None  Other Topics Concern  . Not on file  Social History Narrative  . Not on file   Social Determinants of Health   Financial Resource Strain: Low Risk   . Difficulty of Paying Living Expenses: Not very hard  Food Insecurity: No Food Insecurity  . Worried About Programme researcher, broadcasting/film/video in the Last Year: Never true  . Ran Out of Food in the Last Year: Never true  Transportation Needs: No Transportation Needs  . Lack of Transportation (Medical): No  . Lack of Transportation (Non-Medical): No  Physical Activity: Inactive  . Days of Exercise per Week: 0 days  . Minutes of Exercise  per Session: 0 min  Stress: Not on file  Social Connections: Not on file  Intimate Partner Violence: Not on file    FH: No family history on file.  Past Medical History:  Diagnosis Date  . Hypertension     Current Outpatient Medications  Medication Sig Dispense Refill  . atorvastatin (LIPITOR) 40 MG tablet Take 1 tablet (40 mg total) by mouth at bedtime. 30 tablet 0  . carvedilol (COREG) 12.5 MG tablet Take 1 tablet (12.5 mg total) by mouth 2 (two) times daily with a meal. 30 tablet 0  . furosemide (LASIX) 20 MG tablet Take 1 tablet (20 mg total) by mouth daily. 30 tablet 11  . rivaroxaban (XARELTO) 20 MG TABS tablet Take 1 tablet (20 mg total) by mouth daily with supper. 30 tablet 0  .  sacubitril-valsartan (ENTRESTO) 49-51 MG Take 1 tablet by mouth 2 (two) times daily. 30 tablet 0  . spironolactone (ALDACTONE) 25 MG tablet Take 0.5 tablets (12.5 mg total) by mouth daily. 30 tablet 0   No current facility-administered medications for this encounter.    Vitals:   04/17/20 1308  BP: 106/78  Pulse: 90  SpO2: 92%  Weight: 117.7 kg (259 lb 6 oz)    PHYSICAL EXAM: Cardiac: JVD flat, normal rate and rhythm, clear s1 and s2, no murmurs, rubs or gallops, no LE edema Pulmonary: CTAB, not in distress Abdominal: non distended abdomen, soft and nontender Psych: Alert, conversant, in good spirits  ECG   NSR rate 87, RAE  ASSESSMENT & PLAN: Combined Systolic and Diastolic CHF -03/2020  ECHO with EF 40-45%, G2DD, poorly visualized RV  -nuclear stress test high risk with wall motion abnormalities -new diagnosis, ? Ischemic cardiomyopathy given acuity and risk factors of heavy smoking, obesity and polycythemia, versus Hypertensive cardiomyopathy -NYHA Class II, euvolemic on exam -Snores loudly at night will need sleep study outpatient -baseline SPO2 at 92%, chronically elevated bicarb, heavy smoking history suspicious for emphysematous COPD will need PFT's -placing urgent referral to Heme Onc for polycythemia -currently on carvedilol 12.5mg BID, Entresto 49/51, Spiro 12.5mg, lasix 20mg daily -stop lasix, add farxiga 10mg -added asa 81mg -scheduled for [facility]   Paroxysmal A Flutter: -CHADS2VASC score 3 -rhythm strips in media tab reviewed, does appear to be atrial flutter -He is in NSR today -continue carvedilol, continue xarelto  -needs sleep study  Polycythemia: -epo level in normal range -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 -platelets w/in normal range but hematocrit 60% urgent referral placed to heme onc -recommend renal ultrasound to rule out polycystic kidney disease  -started asa  T2DM: -A1C 6.7 -Start farxiga   Tobacco Use Disorder: -heavy smoker 1 and 1/2 ppd since  age 11 -has not smoked since admission -prescribed nitcotine patches to help assist with this  CKD 3a -likely baseline cr around 1.5, never really sought medical care before admission -likely a hypertensive nephropathy -repeat bmp today -would benefit from at least a renal ultrasound going forward to rule out polycystic kidney disease which can be associated with polycythemia or other patholgies.    Has a PCP appointment, referred to heme onc, medication assistance and education given, started towards acquiring medicaid.  Follow up with [facility]  

## 2020-04-17 NOTE — H&P (View-Only) (Signed)
[facility]  PCP: None  Primary Cardiologist: None  HPI:  Gregory Mckenzie is a 45 y.o.  male  with a PMH significant for Combined systolic and diastolic CHF, obesity, HLD, HTN, T2DM, Tobacco Use Disorder, CKD 3a  Patient never really followed with a doctor prior to admission. Beginning of February noted decreased exercise capacity, shortness of breath and significant weight gain of about 40-50lbs in just February.  Then he developed significant leg swelling.  He denies any chest pressure or pain but felt fatigued easily.    Admitted on 2/22 for symptoms above. Arrived severely hypertensive 180/100. Had an ECHO with EF 40-45%, G2DD ecg and troponin not suggestive of acute ischemia,.had a nuclear stress test which was high risk with wall motion abnormalities. Started on Entresto, spiro, carvedilol.   Diuresed around 7lbs down on discharge to 258lbs. Newly diagnosed with mild diabetes and labs consistent with polycythemia hct around 60%.  Additionally during his stay was found to have Paroxysmal A flutter and started on xarelto.    Smoking at least 1 and 1/2 packs per day since the age of 35.  Never drank alcohol. No illicit drug use only has marijuana occasionally.    HTN runs in his family, father and brother had severe hypertension.  Brother had a pulmonary embolism at age 6.  Thinks his brother also had "thick blood" polycythemia.    Feeling less fatigued at home, walking at home trying to walk a little further to the end of the street and back.  Has to stop and rest while doing this.  Doesn't have stairs.  Doing his ADL's without issue.  No chest pain.  Sleeps on his back mostly denies orthopnea.  Weighing himself at home 255, 254, 254 last three days.     ROS: All systems negative except as listed in HPI, PMH and Problem List.  SH:  Social History   Socioeconomic History  . Marital status: Single    Spouse name: Not on file  . Number of  children: Not on file  . Years of education: Not on file  . Highest education level: Not on file  Occupational History  . Not on file  Tobacco Use  . Smoking status: Former Smoker    Packs/day: 1.00    Years: 27.00    Pack years: 27.00    Types: Cigarettes    Quit date: 04/06/2020    Years since quitting: 0.0  . Smokeless tobacco: Never Used  Vaping Use  . Vaping Use: Never used  Substance and Sexual Activity  . Alcohol use: Not Currently  . Drug use: Yes    Frequency: 7.0 times per week    Types: Marijuana    Comment: daily  . Sexual activity: Yes    Partners: Female    Birth control/protection: None  Other Topics Concern  . Not on file  Social History Narrative  . Not on file   Social Determinants of Health   Financial Resource Strain: Low Risk   . Difficulty of Paying Living Expenses: Not very hard  Food Insecurity: No Food Insecurity  . Worried About Programme researcher, broadcasting/film/video in the Last Year: Never true  . Ran Out of Food in the Last Year: Never true  Transportation Needs: No Transportation Needs  . Lack of Transportation (Medical): No  . Lack of Transportation (Non-Medical): No  Physical Activity: Inactive  . Days of Exercise per Week: 0 days  . Minutes of Exercise  per Session: 0 min  Stress: Not on file  Social Connections: Not on file  Intimate Partner Violence: Not on file    FH: No family history on file.  Past Medical History:  Diagnosis Date  . Hypertension     Current Outpatient Medications  Medication Sig Dispense Refill  . atorvastatin (LIPITOR) 40 MG tablet Take 1 tablet (40 mg total) by mouth at bedtime. 30 tablet 0  . carvedilol (COREG) 12.5 MG tablet Take 1 tablet (12.5 mg total) by mouth 2 (two) times daily with a meal. 30 tablet 0  . furosemide (LASIX) 20 MG tablet Take 1 tablet (20 mg total) by mouth daily. 30 tablet 11  . rivaroxaban (XARELTO) 20 MG TABS tablet Take 1 tablet (20 mg total) by mouth daily with supper. 30 tablet 0  .  sacubitril-valsartan (ENTRESTO) 49-51 MG Take 1 tablet by mouth 2 (two) times daily. 30 tablet 0  . spironolactone (ALDACTONE) 25 MG tablet Take 0.5 tablets (12.5 mg total) by mouth daily. 30 tablet 0   No current facility-administered medications for this encounter.    Vitals:   04/17/20 1308  BP: 106/78  Pulse: 90  SpO2: 92%  Weight: 117.7 kg (259 lb 6 oz)    PHYSICAL EXAM: Cardiac: JVD flat, normal rate and rhythm, clear s1 and s2, no murmurs, rubs or gallops, no LE edema Pulmonary: CTAB, not in distress Abdominal: non distended abdomen, soft and nontender Psych: Alert, conversant, in good spirits  ECG   NSR rate 87, RAE  ASSESSMENT & PLAN: Combined Systolic and Diastolic CHF -03/2020  ECHO with EF 40-45%, G2DD, poorly visualized RV  -nuclear stress test high risk with wall motion abnormalities -new diagnosis, ? Ischemic cardiomyopathy given acuity and risk factors of heavy smoking, obesity and polycythemia, versus Hypertensive cardiomyopathy -NYHA Class II, euvolemic on exam -Snores loudly at night will need sleep study outpatient -baseline SPO2 at 92%, chronically elevated bicarb, heavy smoking history suspicious for emphysematous COPD will need PFT's -placing urgent referral to Heme Onc for polycythemia -currently on carvedilol 12.5mg  BID, Entresto 49/51, Spiro 12.5mg , lasix 20mg  daily -stop lasix, add farxiga 10mg  -added asa 81mg  -scheduled for [facility]   Paroxysmal A Flutter: -CHADS2VASC score 3 -rhythm strips in media tab reviewed, does appear to be atrial flutter -He is in NSR today -continue carvedilol, continue xarelto  -needs sleep study  Polycythemia: -epo level in normal range -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 -platelets w/in normal range but hematocrit 60% urgent referral placed to heme onc -recommend renal ultrasound to rule out polycystic kidney disease  -started asa  T2DM: -A1C 6.7 -Start farxiga   Tobacco Use Disorder: -heavy smoker 1 and 1/2 ppd since  age 43 -has not smoked since admission -prescribed nitcotine patches to help assist with this  CKD 3a -likely baseline cr around 1.5, never really sought medical care before admission -likely a hypertensive nephropathy -repeat bmp today -would benefit from at least a renal ultrasound going forward to rule out polycystic kidney disease which can be associated with polycythemia or other patholgies.    Has a PCP appointment, referred to heme onc, medication assistance and education given, started towards acquiring medicaid.  Follow up with [facility]

## 2020-04-17 NOTE — Progress Notes (Signed)
[facility]  Heart Failure Pharmacist Encounter  Completed application for [employer] Patient Assistance Program sent in an effort to reduce the patient's out of pocket expense for Entresto to $0.     Application completed and faxed to [phone].   [employer] patient assistance phone number for follow up is [phone].   [place] Program sent in an effort to reduce the patient's out of pocket expense for Farxiga to $0.     Application completed and faxed to [phone]   [place] patient assistance phone number for follow up is [phone].    Completed application for [employer] Patient Assistance Program sent in an effort to reduce the patient's out of pocket expense for Xarelto to $0.     Application completed and faxed to [phone]  [employer] patient assistance phone number for follow up is [phone].   Patient was provided with free 30-day Farxiga copay card to obtain his first fill of Marcelline Deist for $0 while this application is pending.  Sharen Hones, PharmD, BCPS  Heart Failure [facility] Pharmacist [phone]

## 2020-04-17 NOTE — Progress Notes (Signed)
Heart and Vascular Care Navigation  04/17/2020  Gregory Mckenzie 1976/01/31 [id number]  Reason for Referral: Patient seen in [facility].                                                                                                    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 Patient reports he and his wife both work for the group home and live at the facility. Patient states he is uninsured and not aware of any pending application for medicaid or any other insurance. He admits to tobacco use and very motivated to quit. He states he used a nicotine patch while in the hospital but does not have any at home.He has not had any cigarettes since discharge and states that he is motivated for improved health although would appreciate some patches to assist with quitting. He denies any other concerns with SDOH at this time.                                       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 No   Patient Prescription Assistance Programs Heart Failure [facility] Devices/Equipment None   Current home services Other (comment)  none       Social History:                                                                             SDOH Screenings   Alcohol Screen: Low Risk   . Last Alcohol Screening Score (AUDIT): 0  Depression (PHQ2-9): Not on file  Financial Resource Strain: Low Risk   . Difficulty of Paying Living Expenses: Not very  hard  Food Insecurity: No Food Insecurity  . Worried About Programme researcher, broadcasting/film/video in the Last Year: Never true  . Ran Out of Food in the Last Year: Never true  Housing: Low Risk   . Last Housing Risk Score: 0  Physical Activity: Inactive  . Days of Exercise per Week: 0 days  . Minutes of Exercise per Session: 0 min  Social Connections: Not on file  Stress: Not  on file  Tobacco Use: Medium Risk  . Smoking Tobacco Use: Former Smoker  . Smokeless Tobacco Use: Never Used  Transportation Needs: No Transportation Needs  . Lack of Transportation (Medical): No  . Lack of Transportation (Non-Medical): No    SDOH Interventions: Financial Resources:  Corporate treasurer Interventions: Child psychotherapist for [employer] Program  Food Insecurity:   n/a  Housing Insecurity:   n/a  Transportation:    n/a   Health Promotion Interventions:     Physical Inactivity provided encouragement for improved activity  Smoking Cessation Assisted with nicotine patches  Dietary Concerns n/a  Health Coaching n/a   Other Care Navigation Interventions:     Inpatient/Outpatient Substance Abuse Counseling/Rehab Options n/a  Provided Pharmacy assistance resources Heart Failure Fund  Patient expressed Mental Health concerns No.  Patient Referred to: n/a   Follow-up plan:  Patient will follow up in the HF clinic for further medical needs. CSW referred to financial counseling for further evaluation for medicaid or possible [employer]. Patient provided with nicotine patches for smoking cessation. Patient verbalizes follow up and will reach out to CSW as needed. Lasandra Beech, LCSW, CCSW-MCS [phone]

## 2020-04-17 NOTE — Telephone Encounter (Signed)
Left message with family member as "he is not here at the moment" about appointment. Family member stated correct time and place of the appt, RN confirmed appt today with [facility] TOC @ 1pm. Instructed to bring medications.   Ozella Rocks, RN, BSN Heart Failure Nurse Navigator [phone]

## 2020-04-17 NOTE — Patient Instructions (Signed)
Stop Furosemide  Start Aspirin 81 mg Daily  Start Farxiga 10 mg Daily  Your physician has requested that you have a cardiac catheterization. Cardiac catheterization is used to diagnose and/or treat various heart conditions. Doctors may recommend this procedure for a number of different reasons. The most common reason is to evaluate chest pain. Chest pain can be a symptom of coronary artery disease (CAD), and cardiac catheterization can show whether plaque is narrowing or blocking your heart's arteries. This procedure is also used to evaluate the valves, as well as measure the blood flow and oxygen levels in different parts of your heart. For further information please visit [email or web address] Please follow instruction sheet, as given.  Thank you for allowing [place] to provider your heart failure care after your recent hospitalization. Please follow-up with our Advanced Heart Failure Clinic in 3 weeks  If you have any questions, issues, or concerns before your next appointment please call our office at [phone], opt. 2 and leave a message for the triage nurse.   CATHETERIZATION INSTRUCTIONS  You are scheduled for a Cardiac Catheterization on Thursday, March 10 with Dr. Marca Ancona.  1. Please arrive at the [facility] (Main Entrance A) at [facility]: [address] [place], [place] [address] at 7:30 AM (This time is two hours before your procedure to ensure your preparation). Free valet parking service is available.   Special note: Every effort is made to have your procedure done on time. Please understand that emergencies sometimes delay scheduled procedures.  2. Diet: Do not eat solid foods after midnight.  The patient may have clear liquids until 5am upon the day of the procedure.  3. COVID TEST: Tuesday March 8 at 11:15 AM, this is a drive thru testing site located at:  [address]  4. Medication instructions in preparation for your procedure:   Wed 3/9  DO NOT TAKE Janet Berlin 3/10 DO NOT TAKE Farxiga or Spironolactone   On the morning of your procedure, take your Aspirin and any morning medicines NOT listed above.  You may use sips of water.  5. Plan for one night stay--bring personal belongings. 6. Bring a current list of your medications and current insurance cards. 7. You MUST have a responsible person to drive you home. 8. Someone MUST be with you the first 24 hours after you arrive home or your discharge will be delayed. 9. Please wear clothes that are easy to get on and off and wear slip-on shoes.  Thank you for allowing [place] to care for you!   -- [facility]    Do the following things EVERYDAY: 1) Weigh yourself in the morning before breakfast. Write it down and keep it in a log. 2) Take your medicines as prescribed 3) Eat low salt foods-Limit salt (sodium) to 2000 mg per day.  4) Stay as active as you can everyday 5) Limit all fluids for the day to less than 2 liters  Restrict your sodium intake to less than 2000mg  per day. This will help prevent your body from holding onto fluid. Read food labels as a lot of canned and packaged foods have a lot of sodium.  Limit your fluid intake to less than 2 liters of fluid per day. Fluid includes all drinks, coffee, juice, ice chips, soup, jello, and all other liquids.

## 2020-04-17 NOTE — Telephone Encounter (Signed)
Spoke with pt about lab results renal function stable.  Potassium 5.0 on spironolactone 12.5mg .  We discontinued his lasix today and I expect this value will get higher.  He is instructed to stop spironolactone for now and perhaps if renal function continues to improve and he is on SGLT2i therapy for a period of time it will be safe to cautiously restart but for now we will hold his spiro.

## 2020-04-18 ENCOUNTER — Encounter (HOSPITAL_COMMUNITY): Payer: Self-pay

## 2020-04-19 NOTE — Telephone Encounter (Signed)
Duplicate encounter

## 2020-04-20 ENCOUNTER — Telehealth (HOSPITAL_COMMUNITY): Payer: Self-pay | Admitting: Licensed Clinical Social Worker

## 2020-04-20 NOTE — Telephone Encounter (Signed)
CSW contacted patient by phone to inform that Sharmon Revere from [employer] is trying to reach him to evaluate for medicaid. CSW left message for return call. Lasandra Beech, LCSW, CCSW-MCS [phone]

## 2020-04-20 NOTE — Telephone Encounter (Signed)
CSW received return call from patient and states he has a message from Music therapist. CSW encouraged patient to return call and follow up. Patient verbalizes understanding and denies any other concerns at this time. Lasandra Beech, LCSW, CCSW-MCS [phone]

## 2020-04-21 ENCOUNTER — Other Ambulatory Visit (HOSPITAL_COMMUNITY)
Admission: RE | Admit: 2020-04-21 | Discharge: 2020-04-21 | Disposition: A | Discharge status: Home / Self Care | Payer: Self-pay | Source: Ambulatory Visit | Attending: Cardiology | Admitting: Cardiology

## 2020-04-21 DIAGNOSIS — Z20822 Contact with and (suspected) exposure to covid-19: Secondary | ICD-10-CM | POA: Insufficient documentation

## 2020-04-21 DIAGNOSIS — Z01812 Encounter for preprocedural laboratory examination: Secondary | ICD-10-CM | POA: Insufficient documentation

## 2020-04-21 LAB — SARS CORONAVIRUS 2 (TAT 6-24 HRS): SARS Coronavirus 2: NEGATIVE

## 2020-04-22 ENCOUNTER — Telehealth (HOSPITAL_COMMUNITY): Payer: Self-pay

## 2020-04-22 ENCOUNTER — Ambulatory Visit: Payer: Self-pay | Admitting: Cardiology

## 2020-04-22 NOTE — Telephone Encounter (Signed)
Gregory Mckenzie need to move cath with Dalton McLean,MD up to 7:30am tomorrow with an arrival time of 5:30am

## 2020-04-23 ENCOUNTER — Telehealth (HOSPITAL_COMMUNITY): Payer: Self-pay | Admitting: *Deleted

## 2020-04-23 ENCOUNTER — Ambulatory Visit (HOSPITAL_COMMUNITY): Admission: RE | Admit: 2020-04-23 | Payer: Self-pay | Source: Home / Self Care | Admitting: Cardiology

## 2020-04-23 ENCOUNTER — Encounter (HOSPITAL_COMMUNITY): Admission: RE | Payer: Self-pay | Source: Home / Self Care

## 2020-04-23 SURGERY — RIGHT/LEFT HEART CATH AND CORONARY ANGIOGRAPHY
Anesthesia: LOCAL

## 2020-04-23 NOTE — Telephone Encounter (Signed)
Pt cx'd HC sch for today due a family emergency. Attempted to call pt to resch, no answer and no VM on home number, Left message to call back on mobile number VM

## 2020-04-24 ENCOUNTER — Encounter: Payer: Self-pay | Admitting: Hematology and Oncology

## 2020-04-24 ENCOUNTER — Telehealth: Payer: Self-pay | Admitting: Hematology and Oncology

## 2020-04-24 ENCOUNTER — Other Ambulatory Visit: Payer: Self-pay

## 2020-04-24 NOTE — Telephone Encounter (Signed)
Received a new hem referral from Dr. Frances Furbish for polycythemia. Gregory Mckenzie has been cld and scheduled to see Dr. Leonides Schanz on 3/14 at 1pm. Pt aware to arrive 20 minutes early.

## 2020-04-27 ENCOUNTER — Inpatient Hospital Stay: Payer: Self-pay | Attending: Hematology and Oncology | Admitting: Hematology and Oncology

## 2020-04-27 ENCOUNTER — Inpatient Hospital Stay: Payer: Self-pay

## 2020-04-29 ENCOUNTER — Telehealth (HOSPITAL_COMMUNITY): Payer: Self-pay | Admitting: Pharmacy Technician

## 2020-04-29 NOTE — Telephone Encounter (Addendum)
Advanced Heart Failure Patient Advocate Encounter   Patient was approved to receive Farxiga from [employer]  Patient ID: [id number] Effective dates: 04/21/20 through 04/20/21  Advanced Heart Failure Patient Advocate Encounter   Patient was approved to receive Entresto from [employer]  Patient ID: [id number] Effective dates: 04/21/20 through 04/21/21  Called and spoke with the patient.   Archer Asa, CPhT

## 2020-04-30 ENCOUNTER — Telehealth (HOSPITAL_COMMUNITY): Payer: Self-pay | Admitting: Licensed Clinical Social Worker

## 2020-04-30 NOTE — Telephone Encounter (Signed)
CSW received VM from pt requesting help getting MD note for his job stating its safe for him to return to work.  CSW sent inbasket request to clinic staff for assistance.  Will continue to follow and assist as needed  Burna Sis, LCSW Clinical Social Worker [facility] Desk#: [phone] Cell#: [phone]

## 2020-05-04 LAB — JAK2 EXONS 12-15

## 2020-05-04 LAB — JAK2  V617F QUAL. WITH REFLEX TO EXON 12: Reflex:: 15

## 2020-05-05 ENCOUNTER — Other Ambulatory Visit: Payer: Self-pay

## 2020-05-05 ENCOUNTER — Ambulatory Visit (HOSPITAL_COMMUNITY): Admission: RE | Admit: 2020-05-05 | Payer: Self-pay | Source: Ambulatory Visit

## 2020-05-05 ENCOUNTER — Telehealth (HOSPITAL_COMMUNITY): Payer: Self-pay | Admitting: Licensed Clinical Social Worker

## 2020-05-05 ENCOUNTER — Telehealth (HOSPITAL_COMMUNITY): Payer: Self-pay

## 2020-05-05 NOTE — Telephone Encounter (Signed)
Patient reports that he is having a hard time paying his bills and is on the verge of being evicted out of his apartment due to him not being able to work. Is there anything we can do to help him?

## 2020-05-05 NOTE — Telephone Encounter (Signed)
CSW consulted to reach out to pt regarding current financial concerns.  CSW called pt mobile phone and unable to reach- left VM requesting return call  Will attempt to make contact again later  Burna Sis, LCSW Clinical Social Worker [facility] Desk#: [phone] Cell#: [phone]

## 2020-05-06 ENCOUNTER — Telehealth (HOSPITAL_COMMUNITY): Payer: Self-pay | Admitting: Licensed Clinical Social Worker

## 2020-05-06 ENCOUNTER — Other Ambulatory Visit (HOSPITAL_COMMUNITY)
Admission: RE | Admit: 2020-05-06 | Discharge: 2020-05-06 | Disposition: A | Discharge status: Home / Self Care | Payer: Self-pay | Source: Ambulatory Visit | Attending: Cardiology | Admitting: Cardiology

## 2020-05-06 DIAGNOSIS — Z20822 Contact with and (suspected) exposure to covid-19: Secondary | ICD-10-CM | POA: Insufficient documentation

## 2020-05-06 DIAGNOSIS — Z01812 Encounter for preprocedural laboratory examination: Secondary | ICD-10-CM | POA: Insufficient documentation

## 2020-05-06 LAB — SARS CORONAVIRUS 2 (TAT 6-24 HRS): SARS Coronavirus 2: NEGATIVE

## 2020-05-06 NOTE — Telephone Encounter (Addendum)
CSW consulted to speak with pt regarding current financial concerns as pt has been unable to work since the end of February.  Pt reports that he owes rent for March, $600/month, but feels as if he would be cleared to go back to work he would be able to cover this.  Has heart cath on Friday and MD hopeful to be able to give him an answer regarding returning to work after that test.  Pt reports being able to pay for other needs at this time.  Is agreeable to CSW reaching out to him next week to check in regarding need for rental assistance once he knows better if he can return to work.  Pt also inquired about getting his Marcelline Deist and Sherryll Burger- CSW saw notes from Patient Advocate that he was approved for both manufacturer assistance programs so CSW provided him with those numbers and encouraged him to call to schedule delivery- told pt to call CSW back if there are any issues.  Will continue to follow and assist as needed  Burna Sis, LCSW Clinical Social Worker [facility] Desk#: [phone] Cell#: [phone]

## 2020-05-06 NOTE — Progress Notes (Deleted)
Patient ID: Gregory Mckenzie, male   DOB: 09-27-1975, 45 y.o.   MRN: [id number] Date of Admission: 04/07/2020 11:40 AM Date of Discharge:  04/14/20 Attending Physician: Dr. Criselda Peaches  Discharge Diagnosis: Principal Problem:   Acute combined systolic and diastolic heart failure (HCC) Active Problems:   Shortness of breath   Acute respiratory failure with hypoxia (HCC)   Anasarca   Bilateral lower extremity edema   Hypertension   Type 2 diabetes mellitus with complication, without long-term current use of insulin (HCC)   Continuous dependence on cigarette smoking   Marijuana use   Class 3 severe obesity due to excess calories with serious comorbidity and body mass index (BMI) of 45.0 to 49.9 in adult [facility], [place])   Paroxysmal atrial flutter (HCC)   Cardiomyopathy (HCC)  Disposition and follow-up:   Gregory Mckenzie was discharged from [facility] in Stable condition.  At the hospital follow up visit please address:  1.  Follow-up:             A. Acute Combined HF - New Dx, following up with cardiology                          B. Atrial Fib/Flutter - Started on Xarelto. Cards to follow              C. New Dx T2DM - outpatient clinic to initiate medications. Started on statin              D. Polycythemia - Jak2 labs pending              E. Acute Kidney Injury - Unsure baseline, repeat BMP              F. Suspected OSA - Needs sleep study   2.  Labs / imaging needed at time of follow-up: BMP, CBC, A1c, Sleep study for OSA  3.  Pending labs/ test needing follow-up: JAK2  4.  Medication Changes (doses as per above)             Started: Spironolactone, Entresto, Carvedilol, Atorvastatin, Xarelto, Lasix             Stopped: None             Abx - None         Hospital Course by problem list:  A. Acute Combined Systolic/Diastolic HF - Patient presented with worsening shortness of breath requiring new O2 supplementation (3L) and lower extremity swelling. Exam was  consistent with HF, bilateral pulmonary crackles, lower extremity edema. CXR consistent with pulmonary edema. TTE performed EF 40-45%, severe concentric LVH, grade II diastolic dysfunction, severely dilated RA/LA consistent with combined HF. Diuresed well on bumex,metalozone however patient had elevation in Cr so discontinued diuretics. Uncertain of patient's baseline Cr as he has not seen provider in some time. Cardiology consulted and will follow up with patient on outpatient basis, did not believe patient needed urgent LHC. Lexiscan performed that was reassuring. Started on carvedilol, entresto, spironolactone, and furosemide on discharge with assistance from HF pharmacist and TOC.               B. Atrial Fib/Flutter - Patient found to have intermittent episodes of of a.fib/flutter on telemetry. CHADSVASC 3, 3.2% stroke risk per year. Patient started on Xarelto. Patient with cardiology follow up, consider EP follow up and possible monitor to evaluate burden.   C. New Dx T2DM - Patient with A1c of 6.7%, patient  denies history of DM. New diagnosis, suspected to be type 2 in nature. Will recommend patient follow up with outpatient clinic for management. With new onset HF, may benefit from metform and SGLT-2.   D. Polycythemia - Hgb elevated in the 20's during admission. Patient has multiple risk factors that may lead to polycythema (smoking hx, suspected OSA). Jak2 labs ordered and pending at discharge. Will need further outpatient work up.   E. Acute Kidney Injury - Patient presented with acute kidney injury, uncertain of patient's baseline kidney function as he has not seen a doctor in some time. Worsened with diuretics and improved once stopped. Will start back on low dose lasix 20 mg daily and have patient follow up with clinic on outpatient basis.  F. Suspected OSA - Patient endorsed his partner saying that during his sleep he snores loudly and she frequently asks if he is okay. Suspected OSA in  setting of history and obesity.   Discharge Subjective: Patient has no concerns this morning upon examination. He denies shortness of breath. Endorses improvement of lower extremity swelling. Endorses his wife stating patient snores quite frequently, never had sleep study.   Discussed with him his new medications including his blood thinner. Talked with him about risks and needing to be careful as he is at an increased risk for bleeding.

## 2020-05-07 ENCOUNTER — Inpatient Hospital Stay: Payer: Self-pay | Admitting: Physician Assistant

## 2020-05-07 DIAGNOSIS — E118 Type 2 diabetes mellitus with unspecified complications: Secondary | ICD-10-CM

## 2020-05-08 ENCOUNTER — Ambulatory Visit (HOSPITAL_COMMUNITY)
Admission: RE | Admit: 2020-05-08 | Discharge: 2020-05-08 | Disposition: A | Discharge status: Home / Self Care | Payer: Self-pay | Source: Ambulatory Visit | Attending: Cardiology | Admitting: Cardiology

## 2020-05-08 ENCOUNTER — Other Ambulatory Visit: Payer: Self-pay

## 2020-05-08 ENCOUNTER — Ambulatory Visit (HOSPITAL_COMMUNITY)
Admission: RE | Disposition: A | Discharge status: Home / Self Care | Payer: Self-pay | Source: Ambulatory Visit | Attending: Cardiology

## 2020-05-08 DIAGNOSIS — I2510 Atherosclerotic heart disease of native coronary artery without angina pectoris: Secondary | ICD-10-CM | POA: Insufficient documentation

## 2020-05-08 DIAGNOSIS — I5042 Chronic combined systolic (congestive) and diastolic (congestive) heart failure: Secondary | ICD-10-CM | POA: Insufficient documentation

## 2020-05-08 DIAGNOSIS — I429 Cardiomyopathy, unspecified: Secondary | ICD-10-CM

## 2020-05-08 DIAGNOSIS — E1122 Type 2 diabetes mellitus with diabetic chronic kidney disease: Secondary | ICD-10-CM | POA: Insufficient documentation

## 2020-05-08 DIAGNOSIS — Z79899 Other long term (current) drug therapy: Secondary | ICD-10-CM | POA: Insufficient documentation

## 2020-05-08 DIAGNOSIS — Z87891 Personal history of nicotine dependence: Secondary | ICD-10-CM | POA: Insufficient documentation

## 2020-05-08 DIAGNOSIS — Z6841 Body Mass Index (BMI) 40.0 and over, adult: Secondary | ICD-10-CM | POA: Insufficient documentation

## 2020-05-08 DIAGNOSIS — I130 Hypertensive heart and chronic kidney disease with heart failure and stage 1 through stage 4 chronic kidney disease, or unspecified chronic kidney disease: Secondary | ICD-10-CM | POA: Insufficient documentation

## 2020-05-08 DIAGNOSIS — Z7901 Long term (current) use of anticoagulants: Secondary | ICD-10-CM | POA: Insufficient documentation

## 2020-05-08 DIAGNOSIS — I428 Other cardiomyopathies: Secondary | ICD-10-CM | POA: Insufficient documentation

## 2020-05-08 DIAGNOSIS — E669 Obesity, unspecified: Secondary | ICD-10-CM | POA: Insufficient documentation

## 2020-05-08 DIAGNOSIS — D751 Secondary polycythemia: Secondary | ICD-10-CM | POA: Insufficient documentation

## 2020-05-08 DIAGNOSIS — I4892 Unspecified atrial flutter: Secondary | ICD-10-CM | POA: Insufficient documentation

## 2020-05-08 DIAGNOSIS — N1831 Chronic kidney disease, stage 3a: Secondary | ICD-10-CM | POA: Insufficient documentation

## 2020-05-08 HISTORY — PX: RIGHT/LEFT HEART CATH AND CORONARY ANGIOGRAPHY: CATH118266

## 2020-05-08 LAB — POCT I-STAT EG7
Acid-Base Excess: 0 mmol/L (ref 0.0–2.0)
Acid-Base Excess: 1 mmol/L (ref 0.0–2.0)
Bicarbonate: 29.5 mmol/L — ABNORMAL HIGH (ref 20.0–28.0)
Bicarbonate: 29.9 mmol/L — ABNORMAL HIGH (ref 20.0–28.0)
Calcium, Ion: 1.33 mmol/L (ref 1.15–1.40)
Calcium, Ion: 1.28 mmol/L (ref 1.15–1.40)
HCT: 62 % — ABNORMAL HIGH (ref 39.0–52.0)
HCT: 61 % — ABNORMAL HIGH (ref 39.0–52.0)
Hemoglobin: 21.1 g/dL (ref 13.0–17.0)
Hemoglobin: 20.7 g/dL — ABNORMAL HIGH (ref 13.0–17.0)
O2 Saturation: 72 %
O2 Saturation: 73 %
Potassium: 4.1 mmol/L (ref 3.5–5.1)
Potassium: 4 mmol/L (ref 3.5–5.1)
Sodium: 141 mmol/L (ref 135–145)
Sodium: 142 mmol/L (ref 135–145)
TCO2: 31 mmol/L (ref 22–32)
TCO2: 32 mmol/L (ref 22–32)
pCO2, Ven: 63.2 mmHg — ABNORMAL HIGH (ref 44.0–60.0)
pCO2, Ven: 63.1 mmHg — ABNORMAL HIGH (ref 44.0–60.0)
pH, Ven: 7.278 (ref 7.250–7.430)
pH, Ven: 7.283 (ref 7.250–7.430)
pO2, Ven: 44 mmHg (ref 32.0–45.0)
pO2, Ven: 45 mmHg (ref 32.0–45.0)

## 2020-05-08 SURGERY — RIGHT/LEFT HEART CATH AND CORONARY ANGIOGRAPHY
Anesthesia: LOCAL

## 2020-05-08 MED ORDER — SODIUM CHLORIDE 0.9 % IV SOLN: INTRAVENOUS | Status: DC

## 2020-05-08 MED ORDER — SODIUM CHLORIDE 0.9% FLUSH: 3.0000 mL | Freq: Two times a day (BID) | INTRAVENOUS | Status: DC

## 2020-05-08 MED ORDER — ASPIRIN 81 MG PO CHEW: 81.0000 mg | CHEWABLE_TABLET | ORAL | Status: DC

## 2020-05-08 MED ORDER — SODIUM CHLORIDE 0.9% FLUSH: 3.0000 mL | INTRAVENOUS | Status: DC | PRN

## 2020-05-08 MED ORDER — LABETALOL HCL 5 MG/ML IV SOLN: 10.0000 mg | INTRAVENOUS | Status: DC | PRN

## 2020-05-08 MED ORDER — HEPARIN (PORCINE) IN NACL 1000-0.9 UT/500ML-% IV SOLN
INTRAVENOUS | Status: AC
  Filled 2020-05-08: qty 1000

## 2020-05-08 MED ORDER — FENTANYL CITRATE (PF) 100 MCG/2ML IJ SOLN
INTRAMUSCULAR | Status: AC
  Filled 2020-05-08: qty 2

## 2020-05-08 MED ORDER — SODIUM CHLORIDE 0.9 % IV SOLN: 250.0000 mL | INTRAVENOUS | Status: DC | PRN

## 2020-05-08 MED ORDER — SODIUM CHLORIDE 0.9 % IV SOLN
250.0000 mL | INTRAVENOUS | Status: DC | PRN
Start: 2020-05-08 — End: 2020-05-08

## 2020-05-08 MED ORDER — HEPARIN SODIUM (PORCINE) 1000 UNIT/ML IJ SOLN
INTRAMUSCULAR | Status: DC | PRN
  Administered 2020-05-08: 5500 [IU] via INTRAVENOUS

## 2020-05-08 MED ORDER — HEPARIN SODIUM (PORCINE) 1000 UNIT/ML IJ SOLN
INTRAMUSCULAR | Status: AC
  Filled 2020-05-08: qty 1

## 2020-05-08 MED ORDER — IOHEXOL 350 MG/ML SOLN
INTRAVENOUS | Status: DC | PRN
  Administered 2020-05-08: 55 mL

## 2020-05-08 MED ORDER — HEPARIN (PORCINE) IN NACL 1000-0.9 UT/500ML-% IV SOLN
INTRAVENOUS | Status: DC | PRN
  Administered 2020-05-08 (×2): 500 mL

## 2020-05-08 MED ORDER — LIDOCAINE HCL (PF) 1 % IJ SOLN
INTRAMUSCULAR | Status: DC | PRN
  Administered 2020-05-08: 2 mL
  Administered 2020-05-08: 4 mL

## 2020-05-08 MED ORDER — HYDRALAZINE HCL 20 MG/ML IJ SOLN: 10.0000 mg | INTRAMUSCULAR | Status: DC | PRN

## 2020-05-08 MED ORDER — LIDOCAINE HCL (PF) 1 % IJ SOLN
INTRAMUSCULAR | Status: AC
  Filled 2020-05-08: qty 30

## 2020-05-08 MED ORDER — VERAPAMIL HCL 2.5 MG/ML IV SOLN
INTRAVENOUS | Status: AC
  Filled 2020-05-08: qty 2

## 2020-05-08 MED ORDER — MIDAZOLAM HCL 2 MG/2ML IJ SOLN
INTRAMUSCULAR | Status: AC
  Filled 2020-05-08: qty 2

## 2020-05-08 MED ORDER — VERAPAMIL HCL 2.5 MG/ML IV SOLN
INTRAVENOUS | Status: DC | PRN
  Administered 2020-05-08: 10 mL via INTRA_ARTERIAL

## 2020-05-08 MED ORDER — ACETAMINOPHEN 325 MG PO TABS: 650.0000 mg | ORAL_TABLET | ORAL | Status: DC | PRN

## 2020-05-08 MED ORDER — MIDAZOLAM HCL 2 MG/2ML IJ SOLN
INTRAMUSCULAR | Status: DC | PRN
  Administered 2020-05-08: 1 mg via INTRAVENOUS

## 2020-05-08 MED ORDER — ONDANSETRON HCL 4 MG/2ML IJ SOLN: 4.0000 mg | Freq: Four times a day (QID) | INTRAMUSCULAR | Status: DC | PRN

## 2020-05-08 MED ORDER — HEPARIN (PORCINE) IN NACL 1000-0.9 UT/500ML-% IV SOLN
INTRAVENOUS | Status: AC
  Filled 2020-05-08: qty 500

## 2020-05-08 MED ORDER — RIVAROXABAN 20 MG PO TABS: 20.0000 mg | ORAL_TABLET | Freq: Every day | ORAL | 0 refills | Status: DC

## 2020-05-08 MED ORDER — FENTANYL CITRATE (PF) 100 MCG/2ML IJ SOLN
INTRAMUSCULAR | Status: DC | PRN
  Administered 2020-05-08: 25 ug via INTRAVENOUS

## 2020-05-08 SURGICAL SUPPLY — 11 items
CATH 5FR JL3.5 JR4 ANG PIG MP (CATHETERS) ×2 IMPLANT
CATH SWAN GANZ 7F STRAIGHT (CATHETERS) ×2 IMPLANT
DEVICE RAD COMP TR BAND LRG (VASCULAR PRODUCTS) ×2 IMPLANT
GLIDESHEATH SLEND SS 6F .021 (SHEATH) ×2 IMPLANT
GLIDESHEATH SLENDER 7FR .021G (SHEATH) ×2 IMPLANT
GUIDEWIRE .025 260CM (WIRE) ×2 IMPLANT
GUIDEWIRE INQWIRE 1.5J.035X260 (WIRE) ×1 IMPLANT
INQWIRE 1.5J .035X260CM (WIRE) ×2
KIT HEART LEFT (KITS) ×2 IMPLANT
PACK CARDIAC CATHETERIZATION (CUSTOM PROCEDURE TRAY) ×2 IMPLANT
TRANSDUCER W/STOPCOCK (MISCELLANEOUS) ×2 IMPLANT

## 2020-05-08 NOTE — Discharge Instructions (Signed)
Radial Site Care ELEVATE RIGHT WRIST 24 HOURS   This sheet gives you information about how to care for yourself after your procedure. Your health care provider may also give you more specific instructions. If you have problems or questions, contact your health care provider. What can I expect after the procedure? After the procedure, it is common to have:  Bruising and tenderness at the catheter insertion area. Follow these instructions at home: Medicines  Take over-the-counter and prescription medicines only as told by your health care provider. Insertion site care  Follow instructions from your health care provider about how to take care of your insertion site. Make sure you: ? Wash your hands with soap and water before you change your bandage (dressing). If soap and water are not available, use hand sanitizer. ? Change your dressing as told by your health care provider. ? Leave stitches (sutures), skin glue, or adhesive strips in place. These skin closures may need to stay in place for 2 weeks or longer. If adhesive strip edges start to loosen and curl up, you may trim the loose edges. Do not remove adhesive strips completely unless your health care provider tells you to do that.  Check your insertion site every day for signs of infection. Check for: ? Redness, swelling, or pain. ? Fluid or blood. ? Pus or a bad smell. ? Warmth.  Do not take baths, swim, or use a hot tub until your health care provider approves.  You may shower 24-48 hours after the procedure, or as directed by your health care provider. ? Remove the dressing and gently wash the site with plain soap and water. ? Pat the area dry with a clean towel. ? Do not rub the site. That could cause bleeding.  Do not apply powder or lotion to the site. Activity  For 24 hours after the procedure, or as directed by your health care provider: ? Do not flex or bend the affected arm. ? Do not push or pull heavy objects with  the affected arm. ? Do not drive yourself home from the hospital or clinic. You may drive 24 hours after the procedure unless your health care provider tells you not to. ? Do not operate machinery or power tools.  Do not lift anything that is heavier than 10 lb (4.5 kg), or the limit that you are told, until your health care provider says that it is safe.  Ask your health care provider when it is okay to: ? Return to work or school. ? Resume usual physical activities or sports. ? Resume sexual activity.   General instructions  If the catheter site starts to bleed, raise your arm and put firm pressure on the site. If the bleeding does not stop, get help right away. This is a medical emergency.  If you went home on the same day as your procedure, a responsible adult should be with you for the first 24 hours after you arrive home.  Keep all follow-up visits as told by your health care provider. This is important. Contact a health care provider if:  You have a fever.  You have redness, swelling, or yellow drainage around your insertion site. Get help right away if:  You have unusual pain at the radial site.  The catheter insertion area swells very fast.  The insertion area is bleeding, and the bleeding does not stop when you hold steady pressure on the area.  Your arm or hand becomes pale, cool, tingly, or numb.   These symptoms may represent a serious problem that is an emergency. Do not wait to see if the symptoms will go away. Get medical help right away. Call your local emergency services (911 in the [place] Do not drive yourself to the hospital. Summary  After the procedure, it is common to have bruising and tenderness at the site.  Follow instructions from your health care provider about how to take care of your radial site wound. Check the wound every day for signs of infection.  Do not lift anything that is heavier than 10 lb (4.5 kg), or the limit that you are told, until your  health care provider says that it is safe. This information is not intended to replace advice given to you by your health care provider. Make sure you discuss any questions you have with your health care provider. Document Revised: 03/08/2017 Document Reviewed: 03/08/2017 Elsevier Patient Education  2021 Elsevier Inc.  

## 2020-05-08 NOTE — Progress Notes (Signed)
Pt voided without difficulty post procedure

## 2020-05-08 NOTE — Interval H&P Note (Signed)
History and Physical Interval Note:  05/08/2020 1:03 PM  [name]  has presented today for surgery, with the diagnosis of CHF.  The various methods of treatment have been discussed with the patient and family. After consideration of risks, benefits and other options for treatment, the patient has consented to  Procedure(s): RIGHT/LEFT HEART CATH AND CORONARY ANGIOGRAPHY (N/A) as a surgical intervention.  The patient's history has been reviewed, patient examined, no change in status, stable for surgery.  I have reviewed the patient's chart and labs.  Questions were answered to the patient's satisfaction.     Elianne Gubser [employer]

## 2020-05-11 ENCOUNTER — Encounter (HOSPITAL_COMMUNITY): Payer: Self-pay | Admitting: Cardiology

## 2020-05-11 ENCOUNTER — Encounter: Payer: Self-pay | Admitting: Student

## 2020-05-11 MED FILL — Heparin Sod (Porcine)-NaCl IV Soln 1000 Unit/500ML-0.9%: INTRAVENOUS | Qty: 500 | Status: AC

## 2020-05-12 ENCOUNTER — Telehealth (HOSPITAL_COMMUNITY): Payer: Self-pay | Admitting: Licensed Clinical Social Worker

## 2020-05-12 ENCOUNTER — Encounter (HOSPITAL_COMMUNITY): Payer: Self-pay | Admitting: *Deleted

## 2020-05-12 NOTE — Telephone Encounter (Signed)
CSW checking on pt following cath Friday to see if he can be cleared to go back to work so he can pay for his rent.  Pt informed and provided CSW with his supervisors number to get them the letter clearing him to return to work- letter faxed to [employer]- fax confirmation received  Pt also informed he needs to call clinic to reschedule appt  Will continue to follow and assist as needed  Burna Sis, LCSW Clinical Social Worker [facility] Desk#: [phone] Cell#: [phone]

## 2020-05-15 ENCOUNTER — Other Ambulatory Visit (HOSPITAL_COMMUNITY): Payer: Self-pay | Admitting: *Deleted

## 2020-05-15 MED ORDER — CARVEDILOL 12.5 MG PO TABS: 12.5000 mg | ORAL_TABLET | Freq: Two times a day (BID) | ORAL | 0 refills | Status: DC

## 2020-05-15 MED ORDER — RIVAROXABAN 20 MG PO TABS: 20.0000 mg | ORAL_TABLET | Freq: Every day | ORAL | 0 refills | Status: DC

## 2020-05-16 ENCOUNTER — Telehealth: Payer: Self-pay | Admitting: Physician Assistant

## 2020-05-16 NOTE — Telephone Encounter (Signed)
Patient has a history of polycythemia, DM 2, paroxysmal atrial flutter and combined systolic and diastolic heart failure.  He paged after our answering service says he has completely run out of Xarelto and Entresto and unable to afford more.  He does not have insurance, he attempted to apply for Medicaid, however has not heard back.  He says he has filled out medication assistance form application before, however when he arrived in the pharmacy today, they told him the 2 medications will cause more combined total of $1000.  Will forward to our social worker to see if there is anything else to help him lower the cost of medication.  During the meantime, Gregory Mckenzie, can you check to see if we have samples of Xarelto and Entresto to try to bridge him while he waited for the medication assistance.

## 2020-05-17 NOTE — Telephone Encounter (Signed)
Can someone please contact him and help out with this? Thanks.

## 2020-05-18 ENCOUNTER — Other Ambulatory Visit (HOSPITAL_COMMUNITY): Payer: Self-pay | Admitting: *Deleted

## 2020-05-18 ENCOUNTER — Telehealth (HOSPITAL_COMMUNITY): Payer: Self-pay | Admitting: Pharmacy Technician

## 2020-05-18 ENCOUNTER — Other Ambulatory Visit (HOSPITAL_COMMUNITY): Payer: Self-pay

## 2020-05-18 MED ORDER — ATORVASTATIN CALCIUM 40 MG PO TABS
40.0000 mg | ORAL_TABLET | Freq: Every day | ORAL | 3 refills | Status: DC
  Filled 2020-05-18: qty 30, 30d supply, fill #0

## 2020-05-18 MED ORDER — RIVAROXABAN 20 MG PO TABS
20.0000 mg | ORAL_TABLET | Freq: Every day | ORAL | 3 refills | Status: DC
  Filled 2020-05-18: qty 30, 30d supply, fill #0

## 2020-05-18 MED ORDER — CARVEDILOL 12.5 MG PO TABS
12.5000 mg | ORAL_TABLET | Freq: Two times a day (BID) | ORAL | 3 refills | Status: DC
  Filled 2020-05-18: qty 60, 30d supply, fill #0

## 2020-05-18 NOTE — Telephone Encounter (Addendum)
I received a message that the patient was out of Xarelto and wanted to know where he should get the medication from. Called and spoke with the patient. The patient is aware that he can fill Carvedilol, Atorvastatin and Xarelto through the HF fund at [facility].  Beckie Salts, (RN) a request to send the HF fund referral and RXs to [employer].  The currently has Sherryll Burger and [place] assistance that will continue to come from the manufacturer. The patient stated he had both medications.  Advised him to call with any further issues.  Archer Asa, CPhT

## 2020-05-18 NOTE — Telephone Encounter (Signed)
Called patient and he stated that he has received in the mail 3 bottles of Farxiga and has on hand 1 bottle of Entresto. He has never received a card for a 30 supply for Entresto. He stated that he received a blue paper for him to get Xarelto but not sure what the paper says. Patient stated that he needs Atorvastatin, Carvedilol and Xarelto.   Upon further looking into patient's chart he was was informed that he was approved for manufacturer assistance programs for both Entresto and Xarelto and was provided with telephones for both companies to schedule delivery and to call CSW back if he had any issues. Tried calling patient to inquire about if he called the numbers given to him but I did not get an answer.

## 2020-05-18 NOTE — Telephone Encounter (Signed)
[facility], the patient advocate in the Advanced Heart Failure Clinic, has also been looking into this. He was approved for patient assistance for [place]. The Xarelto application is still pending (she called [employer] today to confirm). She will also let him know again to call [employer] to schedule his [place] shipment.

## 2020-05-18 NOTE — Telephone Encounter (Signed)
We curently have in office samples for Xarelto 20 mg none for Entresto.

## 2020-05-19 ENCOUNTER — Other Ambulatory Visit (HOSPITAL_COMMUNITY): Payer: Self-pay

## 2020-05-27 ENCOUNTER — Other Ambulatory Visit (HOSPITAL_COMMUNITY): Payer: Self-pay

## 2020-06-09 ENCOUNTER — Encounter (HOSPITAL_COMMUNITY): Payer: Self-pay | Admitting: Cardiology

## 2020-06-19 ENCOUNTER — Other Ambulatory Visit (HOSPITAL_COMMUNITY): Payer: Self-pay

## 2021-01-29 ENCOUNTER — Telehealth (HOSPITAL_COMMUNITY): Payer: Self-pay | Admitting: Pharmacy Technician

## 2021-01-29 ENCOUNTER — Other Ambulatory Visit (HOSPITAL_COMMUNITY): Payer: Self-pay | Admitting: *Deleted

## 2021-01-29 NOTE — Telephone Encounter (Signed)
Advanced Heart Failure Patient Advocate Encounter  Received notification from [employer] that the patient needed to re-enroll for Farxiga assistance. Patient can fill Farxiga through the HF fund. Messaged Jasmine (CMA) to request a refill be sent to [facility] outpatient.  Archer Asa, CPhT

## 2021-05-14 ENCOUNTER — Inpatient Hospital Stay
Admission: EM | Admit: 2021-05-14 | Discharge: 2021-05-28 | DRG: 291 | Disposition: A | Discharge status: Home / Self Care | Payer: Self-pay | Attending: Internal Medicine | Admitting: Internal Medicine

## 2021-05-14 ENCOUNTER — Other Ambulatory Visit: Payer: Self-pay

## 2021-05-14 ENCOUNTER — Emergency Department: Payer: Self-pay

## 2021-05-14 DIAGNOSIS — G9341 Metabolic encephalopathy: Secondary | ICD-10-CM | POA: Diagnosis present

## 2021-05-14 DIAGNOSIS — Z7901 Long term (current) use of anticoagulants: Secondary | ICD-10-CM

## 2021-05-14 DIAGNOSIS — Z87891 Personal history of nicotine dependence: Secondary | ICD-10-CM

## 2021-05-14 DIAGNOSIS — Z6841 Body Mass Index (BMI) 40.0 and over, adult: Secondary | ICD-10-CM

## 2021-05-14 DIAGNOSIS — N179 Acute kidney failure, unspecified: Secondary | ICD-10-CM

## 2021-05-14 DIAGNOSIS — T502X5A Adverse effect of carbonic-anhydrase inhibitors, benzothiadiazides and other diuretics, initial encounter: Secondary | ICD-10-CM | POA: Diagnosis not present

## 2021-05-14 DIAGNOSIS — N189 Chronic kidney disease, unspecified: Secondary | ICD-10-CM

## 2021-05-14 DIAGNOSIS — I5043 Acute on chronic combined systolic (congestive) and diastolic (congestive) heart failure: Secondary | ICD-10-CM | POA: Diagnosis present

## 2021-05-14 DIAGNOSIS — Z91148 Patient's other noncompliance with medication regimen for other reason: Secondary | ICD-10-CM

## 2021-05-14 DIAGNOSIS — I4891 Unspecified atrial fibrillation: Secondary | ICD-10-CM | POA: Diagnosis present

## 2021-05-14 DIAGNOSIS — E118 Type 2 diabetes mellitus with unspecified complications: Secondary | ICD-10-CM | POA: Diagnosis present

## 2021-05-14 DIAGNOSIS — R0902 Hypoxemia: Secondary | ICD-10-CM

## 2021-05-14 DIAGNOSIS — R778 Other specified abnormalities of plasma proteins: Secondary | ICD-10-CM | POA: Diagnosis present

## 2021-05-14 DIAGNOSIS — E785 Hyperlipidemia, unspecified: Secondary | ICD-10-CM | POA: Diagnosis present

## 2021-05-14 DIAGNOSIS — I5033 Acute on chronic diastolic (congestive) heart failure: Secondary | ICD-10-CM | POA: Insufficient documentation

## 2021-05-14 DIAGNOSIS — I4892 Unspecified atrial flutter: Secondary | ICD-10-CM | POA: Diagnosis present

## 2021-05-14 DIAGNOSIS — E874 Mixed disorder of acid-base balance: Secondary | ICD-10-CM | POA: Diagnosis present

## 2021-05-14 DIAGNOSIS — D751 Secondary polycythemia: Secondary | ICD-10-CM | POA: Diagnosis not present

## 2021-05-14 DIAGNOSIS — Z20822 Contact with and (suspected) exposure to covid-19: Secondary | ICD-10-CM | POA: Diagnosis present

## 2021-05-14 DIAGNOSIS — J9601 Acute respiratory failure with hypoxia: Secondary | ICD-10-CM | POA: Diagnosis present

## 2021-05-14 DIAGNOSIS — Z8249 Family history of ischemic heart disease and other diseases of the circulatory system: Secondary | ICD-10-CM

## 2021-05-14 DIAGNOSIS — R7989 Other specified abnormal findings of blood chemistry: Secondary | ICD-10-CM | POA: Diagnosis present

## 2021-05-14 DIAGNOSIS — I5082 Biventricular heart failure: Secondary | ICD-10-CM | POA: Diagnosis present

## 2021-05-14 DIAGNOSIS — J9621 Acute and chronic respiratory failure with hypoxia: Secondary | ICD-10-CM | POA: Diagnosis present

## 2021-05-14 DIAGNOSIS — E662 Morbid (severe) obesity with alveolar hypoventilation: Secondary | ICD-10-CM | POA: Diagnosis present

## 2021-05-14 DIAGNOSIS — I509 Heart failure, unspecified: Secondary | ICD-10-CM

## 2021-05-14 DIAGNOSIS — I5041 Acute combined systolic (congestive) and diastolic (congestive) heart failure: Secondary | ICD-10-CM | POA: Diagnosis present

## 2021-05-14 DIAGNOSIS — I428 Other cardiomyopathies: Secondary | ICD-10-CM | POA: Diagnosis present

## 2021-05-14 DIAGNOSIS — E1122 Type 2 diabetes mellitus with diabetic chronic kidney disease: Secondary | ICD-10-CM | POA: Diagnosis present

## 2021-05-14 DIAGNOSIS — R0600 Dyspnea, unspecified: Principal | ICD-10-CM

## 2021-05-14 DIAGNOSIS — I5023 Acute on chronic systolic (congestive) heart failure: Secondary | ICD-10-CM | POA: Insufficient documentation

## 2021-05-14 DIAGNOSIS — E1165 Type 2 diabetes mellitus with hyperglycemia: Secondary | ICD-10-CM | POA: Diagnosis present

## 2021-05-14 DIAGNOSIS — I10 Essential (primary) hypertension: Secondary | ICD-10-CM | POA: Diagnosis present

## 2021-05-14 DIAGNOSIS — Z91120 Patient's intentional underdosing of medication regimen due to financial hardship: Secondary | ICD-10-CM

## 2021-05-14 DIAGNOSIS — J9622 Acute and chronic respiratory failure with hypercapnia: Secondary | ICD-10-CM | POA: Diagnosis present

## 2021-05-14 DIAGNOSIS — Z597 Insufficient social insurance and welfare support: Secondary | ICD-10-CM

## 2021-05-14 DIAGNOSIS — I130 Hypertensive heart and chronic kidney disease with heart failure and stage 1 through stage 4 chronic kidney disease, or unspecified chronic kidney disease: Principal | ICD-10-CM | POA: Diagnosis present

## 2021-05-14 DIAGNOSIS — N1831 Chronic kidney disease, stage 3a: Secondary | ICD-10-CM | POA: Diagnosis present

## 2021-05-14 DIAGNOSIS — I2510 Atherosclerotic heart disease of native coronary artery without angina pectoris: Secondary | ICD-10-CM | POA: Diagnosis present

## 2021-05-14 DIAGNOSIS — E875 Hyperkalemia: Secondary | ICD-10-CM | POA: Diagnosis present

## 2021-05-14 DIAGNOSIS — Z79899 Other long term (current) drug therapy: Secondary | ICD-10-CM

## 2021-05-14 HISTORY — DX: Chronic combined systolic (congestive) and diastolic (congestive) heart failure: I50.42

## 2021-05-14 HISTORY — DX: Hyperlipidemia, unspecified: E78.5

## 2021-05-14 HISTORY — DX: Obesity, unspecified: E66.9

## 2021-05-14 HISTORY — DX: Unspecified atrial flutter: I48.92

## 2021-05-14 LAB — BASIC METABOLIC PANEL
Anion gap: 6 (ref 5–15)
BUN: 16 mg/dL (ref 6–20)
CO2: 36 mmol/L — ABNORMAL HIGH (ref 22–32)
Calcium: 8.9 mg/dL (ref 8.9–10.3)
Chloride: 96 mmol/L — ABNORMAL LOW (ref 98–111)
Creatinine, Ser: 1.53 mg/dL — ABNORMAL HIGH (ref 0.61–1.24)
GFR, Estimated: 56 mL/min — ABNORMAL LOW (ref >60)
Glucose, Bld: 92 mg/dL (ref 70–99)
Potassium: 3.9 mmol/L (ref 3.5–5.1)
Sodium: 138 mmol/L (ref 135–145)

## 2021-05-14 LAB — CBC
HCT: 57.5 % — ABNORMAL HIGH (ref 39.0–52.0)
Hemoglobin: 17.5 g/dL — ABNORMAL HIGH (ref 13.0–17.0)
MCH: 28.7 pg (ref 26.0–34.0)
MCHC: 30.4 g/dL (ref 30.0–36.0)
MCV: 94.4 fL (ref 80.0–100.0)
Platelets: 216 10*3/uL (ref 150–400)
RBC: 6.09 MIL/uL — ABNORMAL HIGH (ref 4.22–5.81)
RDW: 17.8 % — ABNORMAL HIGH (ref 11.5–15.5)
WBC: 3.8 10*3/uL — ABNORMAL LOW (ref 4.0–10.5)
nRBC: 0.5 % — ABNORMAL HIGH (ref 0.0–0.2)

## 2021-05-14 LAB — TROPONIN I (HIGH SENSITIVITY)
Troponin I (High Sensitivity): 55 ng/L — ABNORMAL HIGH (ref <18)
Troponin I (High Sensitivity): 67 ng/L — ABNORMAL HIGH (ref <18)
Troponin I (High Sensitivity): 55 ng/L — ABNORMAL HIGH (ref <18)

## 2021-05-14 LAB — BRAIN NATRIURETIC PEPTIDE: B Natriuretic Peptide: 990.3 pg/mL — ABNORMAL HIGH (ref 0.0–100.0)

## 2021-05-14 LAB — RESP PANEL BY RT-PCR (FLU A&B, COVID) ARPGX2
Influenza A by PCR: NEGATIVE
Influenza B by PCR: NEGATIVE
SARS Coronavirus 2 by RT PCR: NEGATIVE

## 2021-05-14 LAB — GLUCOSE, CAPILLARY: Glucose-Capillary: 99 mg/dL (ref 70–99)

## 2021-05-14 MED ORDER — SODIUM CHLORIDE 0.9% FLUSH
3.0000 mL | INTRAVENOUS | Status: DC | PRN
Start: 2021-05-14 — End: 2021-05-28

## 2021-05-14 MED ORDER — FUROSEMIDE 10 MG/ML IJ SOLN: 40.0000 mg | Freq: Every day | INTRAMUSCULAR | Status: DC

## 2021-05-14 MED ORDER — LABETALOL HCL 5 MG/ML IV SOLN: 10.0000 mg | Freq: Once | INTRAVENOUS | Status: DC

## 2021-05-14 MED ORDER — INSULIN ASPART 100 UNIT/ML IJ SOLN
0.0000 [IU] | Freq: Three times a day (TID) | INTRAMUSCULAR | Status: DC
  Administered 2021-05-15 (×2): 2 [IU] via SUBCUTANEOUS
  Filled 2021-05-14 (×2): qty 1

## 2021-05-14 MED ORDER — SPIRONOLACTONE 25 MG PO TABS
12.5000 mg | ORAL_TABLET | Freq: Every day | ORAL | Status: DC
Start: 2021-05-14 — End: 2021-05-19
  Administered 2021-05-14 – 2021-05-19 (×6): 12.5 mg via ORAL
  Filled 2021-05-14 (×2): qty 1
  Filled 2021-05-14: qty 0.5
  Filled 2021-05-14 (×2): qty 1
  Filled 2021-05-14 (×3): qty 0.5
  Filled 2021-05-14: qty 1
  Filled 2021-05-14 (×2): qty 0.5
  Filled 2021-05-14: qty 1

## 2021-05-14 MED ORDER — CARVEDILOL 12.5 MG PO TABS
12.5000 mg | ORAL_TABLET | Freq: Two times a day (BID) | ORAL | Status: DC
Start: 2021-05-14 — End: 2021-05-17
  Administered 2021-05-14 – 2021-05-16 (×5): 12.5 mg via ORAL
  Filled 2021-05-14 (×5): qty 1

## 2021-05-14 MED ORDER — SODIUM CHLORIDE 0.9% FLUSH
3.0000 mL | Freq: Two times a day (BID) | INTRAVENOUS | Status: DC
  Administered 2021-05-14 – 2021-05-28 (×24): 3 mL via INTRAVENOUS

## 2021-05-14 MED ORDER — RIVAROXABAN 20 MG PO TABS
20.0000 mg | ORAL_TABLET | Freq: Every day | ORAL | Status: DC
  Administered 2021-05-14 – 2021-05-19 (×6): 20 mg via ORAL
  Filled 2021-05-14 (×8): qty 1

## 2021-05-14 MED ORDER — ATORVASTATIN CALCIUM 20 MG PO TABS
40.0000 mg | ORAL_TABLET | Freq: Every day | ORAL | Status: DC
  Administered 2021-05-14 – 2021-05-18 (×5): 40 mg via ORAL
  Filled 2021-05-14 (×5): qty 2

## 2021-05-14 MED ORDER — SODIUM CHLORIDE 0.9 % IV SOLN: 250.0000 mL | INTRAVENOUS | Status: DC | PRN

## 2021-05-14 MED ORDER — SACUBITRIL-VALSARTAN 49-51 MG PO TABS
1.0000 | ORAL_TABLET | Freq: Two times a day (BID) | ORAL | Status: DC
  Administered 2021-05-14 – 2021-05-16 (×5): 1 via ORAL
  Filled 2021-05-14 (×5): qty 1

## 2021-05-14 MED ORDER — ONDANSETRON HCL 4 MG/2ML IJ SOLN: 4.0000 mg | Freq: Four times a day (QID) | INTRAMUSCULAR | Status: DC | PRN

## 2021-05-14 MED ORDER — ACETAMINOPHEN 325 MG PO TABS: 650.0000 mg | ORAL_TABLET | Freq: Four times a day (QID) | ORAL | Status: DC | PRN

## 2021-05-14 MED ORDER — ACETAMINOPHEN 650 MG RE SUPP: 650.0000 mg | Freq: Four times a day (QID) | RECTAL | Status: DC | PRN

## 2021-05-14 MED ORDER — ONDANSETRON HCL 4 MG PO TABS: 4.0000 mg | ORAL_TABLET | Freq: Four times a day (QID) | ORAL | Status: DC | PRN

## 2021-05-14 MED ORDER — FUROSEMIDE 10 MG/ML IJ SOLN
60.0000 mg | Freq: Once | INTRAMUSCULAR | Status: AC
  Administered 2021-05-14: 60 mg via INTRAVENOUS
  Filled 2021-05-14: qty 8

## 2021-05-14 NOTE — Assessment & Plan Note (Addendum)
Patient is currently in sinus rhythm ?Continue Xarelto and beta-blocker ?

## 2021-05-14 NOTE — Assessment & Plan Note (Deleted)
Secondary to diabetes mellitus and hypertension ?

## 2021-05-14 NOTE — Assessment & Plan Note (Addendum)
Echocardiogram performed on 05/15/2021 showed ejection fraction 40 to [id number], grade 2 diastolic dysfunction. ?Diuresed with Net IO Since Admission: -24,871.31 mL [05/25/21 1753] ?--Mgmt per cardiology ?--Strict I/O's, daily weights ?--Increase Coreg, started Losartan. continue Farxiga, PO Lasix 40 daily ?

## 2021-05-14 NOTE — Assessment & Plan Note (Addendum)
A1c 6.6, well controlled. SSI for now ? ?

## 2021-05-14 NOTE — Assessment & Plan Note (Addendum)
This is secondary to acute on chronic congestive heart failure ?Now on 3 liters O2, continue weaning efforts, patient doesn't use O2 at home ? ?

## 2021-05-14 NOTE — H&P (Addendum)
?History and Physical  ? ? ?Patient: Gregory Mckenzie DOB: Jul 01, 1975 ?DOA: 05/14/2021 ?DOS: the patient was seen and examined on 05/14/2021 ?PCP: Patient, No Pcp Per (Inactive)  ?Patient coming from: Home ? ?Chief Complaint:  ?Chief Complaint  ?Patient presents with  ? Shortness of Breath  ? ?HPI: Gregory Mckenzie is a 46 y.o. male with medical history significant for chronic systolic heart failure and hypertension who presents to the ER for evaluation of a 1 week history of scrotal swelling associated with shortness of breath and lower extremity swelling. ?He has a history of CHF and admits to being noncompliant with prescribed medications.  According to him his medications are supposed to be shipped to his house but he has not received this in 2 months. ?He admits to weight gain but is unable to tell me how much weight he has gained.  He does not have a scale at home.  He also admits to dietary indiscretion. ?Per EMS he had room air pulse oximetry of 80% and is currently on 5 L of oxygen with improvement in his pulse oximetry to 91%. ?He denies having any chest pain, no nausea, no vomiting, no abdominal pain, no changes in his bowel habits, no dizziness, no lightheadedness, no blurred vision or focal deficit. ? ?Review of Systems: As mentioned in the history of present illness. All other systems reviewed and are negative. ?Past Medical History:  ?Diagnosis Date  ? Hypertension   ? ?Past Surgical History:  ?Procedure Laterality Date  ? RIGHT/LEFT HEART CATH AND CORONARY ANGIOGRAPHY N/A 05/08/2020  ? Procedure: RIGHT/LEFT HEART CATH AND CORONARY ANGIOGRAPHY;  Surgeon: Laurey Morale, MD;  Location: [facility] INVASIVE CV LAB;  Service: Cardiovascular;  Laterality: N/A;  ? ?Social History:  reports that he quit smoking about 13 months ago. His smoking use included cigarettes. He has a 27.00 pack-year smoking history. He has never used smokeless tobacco. He reports that he does not currently use alcohol. He reports  current drug use. Frequency: 7.00 times per week. Drug: Marijuana. ? ?No Known Allergies ? ?No family history on file. ? ?Prior to Admission medications   ?Medication Sig Start Date End Date Taking? Authorizing Provider  ?atorvastatin (LIPITOR) 40 MG tablet Take 1 tablet (40 mg total) by mouth daily. ?Patient not taking: Reported on 05/14/2021 05/18/20   Laurey Morale, MD  ?atorvastatin (LIPITOR) 40 MG tablet TAKE 1 TABLET (40 MG TOTAL) BY MOUTH AT BEDTIME. ?Patient not taking: Reported on 05/14/2021 04/14/20 04/14/21  Azucena Fallen, MD  ?carvedilol (COREG) 12.5 MG tablet Take 1 tablet (12.5 mg total) by mouth 2 (two) times daily. ?Patient not taking: Reported on 05/14/2021 05/18/20   Laurey Morale, MD  ?carvedilol (COREG) 12.5 MG tablet TAKE 1 TABLET (12.5 MG TOTAL) BY MOUTH TWO TIMES DAILY WITH A MEAL. ?Patient not taking: Reported on 05/14/2021 04/14/20 04/14/21  Belva Agee, MD  ?dapagliflozin propanediol (FARXIGA) 10 MG TABS tablet Take 1 tablet (10 mg total) by mouth daily before breakfast. ?Patient not taking: Reported on 05/14/2021 04/17/20   Angelita Ingles, MD  ?furosemide (LASIX) 20 MG tablet TAKE 1 TABLET (20 MG TOTAL) BY MOUTH DAILY. ?Patient not taking: Reported on 05/14/2021 04/14/20 04/14/21  Belva Agee, MD  ?rivaroxaban (XARELTO) 20 MG TABS tablet Take 1 tablet (20 mg total) by mouth daily with supper. ?Patient not taking: Reported on 05/14/2021 05/18/20   Laurey Morale, MD  ?rivaroxaban (XARELTO) 20 MG TABS tablet TAKE 1 TABLET (20 MG TOTAL) BY  MOUTH DAILY WITH SUPPER. ?Patient not taking: Reported on 05/14/2021 04/14/20 04/14/21  Belva Agee, MD  ?sacubitril-valsartan (ENTRESTO) 49-51 MG Take 1 tablet by mouth 2 (two) times daily. ?Patient not taking: Reported on 05/14/2021 04/14/20   Belva Agee, MD  ?spironolactone (ALDACTONE) 25 MG tablet TAKE 1/2 TABLET (12.5 MG TOTAL) BY MOUTH DAILY. ?Patient not taking: Reported on 05/14/2021 04/14/20 04/14/21  Belva Agee, MD   ? ? ?Physical Exam: ?Vitals:  ? 05/14/21 1415 05/14/21 1430 05/14/21 1445 05/14/21 1500  ?BP: (!) 156/126 (!) 159/131 (!) 142/97   ?Pulse: 94 98 94 92  ?Resp: (!) 36 (!) 26 (!) 29 (!) 35  ?Temp:      ?TempSrc:      ?SpO2: 95% 95% 96% 99%  ? ?Physical Exam ?Vitals and nursing note reviewed.  ?Constitutional:   ?   Appearance: He is obese.  ?HENT:  ?   Head: Normocephalic and atraumatic.  ?   Mouth/Throat:  ?   Mouth: Mucous membranes are moist.  ?Eyes:  ?   Pupils: Pupils are equal, round, and reactive to light.  ?Cardiovascular:  ?   Rate and Rhythm: Normal rate and regular rhythm.  ?Pulmonary:  ?   Effort: Tachypnea present.  ?   Breath sounds: Examination of the right-lower field reveals rales. Examination of the left-lower field reveals rales. Rales present.  ?Abdominal:  ?   General: Bowel sounds are normal.  ?   Palpations: Abdomen is soft.  ?Musculoskeletal:  ?   Cervical back: Normal range of motion and neck supple.  ?   Right lower leg: Edema present.  ?   Left lower leg: Edema present.  ?Skin: ?   General: Skin is warm and dry.  ?Neurological:  ?   General: No focal deficit present.  ?   Mental Status: He is alert.  ?Psychiatric:     ?   Mood and Affect: Mood normal.     ?   Behavior: Behavior normal.  ? ? ?Data Reviewed: ?Relevant notes from primary care and specialist visits, past discharge summaries as available in EHR, including Care Everywhere. ?Prior diagnostic testing as pertinent to current admission diagnoses ?Updated medications and problem lists for reconciliation ?ED course, including vitals, labs, imaging, treatment and response to treatment ?Triage notes, nursing and pharmacy notes and ED provider's notes ?Notable results as noted in HPI ?Labs reviewed.  Serum creatinine 1.53, BNP 990, troponin 55, white count 3.8, hemoglobin 17.5, hematocrit 47.5, platelet count 216 ?Chest x-ray reviewed by me shows stable cardiomegaly. Pulmonary vascular congestion with pulmonary mild edema. No appreciable  pleural effusion. ?Twelve-lead EKG reviewed by me shows normal sinus rhythm with right axis deviation. ?There are no new results to review at this time. ? ? ?Assessment and Plan: ?* Acute combined systolic and diastolic heart failure (HCC) ?Patient has a known history of CHF and admits to being noncompliant with prescribed medications ?Last known LVEF from a 2D echocardiogram done 03/22 showed an LVEF of 40 to 45% ?Place patient on Lasix 40 mg IV twice daily ?Continue carvedilol, Entresto and spironolactone ?Repeat 2D echocardiogram to assess LVEF ? ?Acute respiratory failure with hypoxia (HCC) ?Secondary to acute on chronic combined systolic and diastolic dysfunction CHF ?Patient had room air pulse oximetry in the low 80s and is currently on 5 L of oxygen to maintain pulse oximetry of about 92% ?We will attempt to wean off oxygen once acute condition improves/resolves ? ?Non compliance w medication regimen ?Patient admits to being noncompliant with  prescribed medications ?We will request TOC consult to assist with medication needs ? ?Elevated troponin ?Secondary to demand ischemia from acute CHF ?We will cycle cardiac enzymes ?Continue statins and beta-blockers ? ?Chronic kidney disease, stage 3a ([place]) ?Secondary to diabetes mellitus and hypertension ?Renal function is stable ? ?Paroxysmal atrial flutter ([place]) ?Patient is currently in sinus rhythm ?Continue carvedilol for rate control ?Continue Xarelto as primary prophylaxis for an acute stroke ? ?Class 3 severe obesity due to excess calories with serious comorbidity and body mass index (BMI) of 45.0 to 49.9 in adult [facility]) ?Complicates overall prognosis and care ?Lifestyle modification and exercise has been discussed with patient ? ?Type 2 diabetes mellitus with complication, without long-term current use of insulin ([place]) ?Maintain consistent carbohydrate diet ?Glycemic control sliding scale insulin ? ?Hypertension ?Blood pressure is stable ?Continue  carvedilol ? ? ? ? ? Advance Care Planning:   Code Status: Full Code  ? ?Consults: TOC ? ?Family Communication: Greater than 50% of time discussing patient's condition and plan of care with him at the bedside.  All

## 2021-05-14 NOTE — Assessment & Plan Note (Addendum)
Body mass index is 43.2 kg/m?[name] ?Complicates overall care and prognosis.  Recommend lifestyle modifications including physical activity and diet for weight loss and overall long-term health ? ?

## 2021-05-14 NOTE — Assessment & Plan Note (Addendum)
On admission, pt reported not taking his medicine for the last 3-month, due to difficulty to access medical care.   ?Send all medication to med management at time of discharge. ?

## 2021-05-14 NOTE — ED Notes (Signed)
C/o distended stomach. Pt. Denies CP, states SOB increased to unbearable last night. ?

## 2021-05-14 NOTE — Assessment & Plan Note (Addendum)
Increase Coreg. Started Losartan. Also on diuretics. ?

## 2021-05-14 NOTE — ED Triage Notes (Signed)
Patient to ER via POV with complaints of shortness of breath that started last night. Reports 2 days of scrotal swelling, some non-pitting lower extremity edema. Reports hx of CHF, has not taken any medications because it was not shipped to his house and he can't afford it. Denies cough. Denies CP.  ? ?On room air at baseline. In triage, RA sats 80%. Placed on 5L via  with improvement of sats to 91%.  ?

## 2021-05-14 NOTE — ED Notes (Signed)
Pt. Boosted in stretcher, repositioned for comfort. Purewick in place and effective. ? ?

## 2021-05-14 NOTE — Assessment & Plan Note (Addendum)
Due to demand ischemia in setting of acute on chronic congestive heart failure ?

## 2021-05-14 NOTE — ED Provider Notes (Signed)
? ?[facility] ?Provider Note ? ? ? Event Date/Time  ? First MD Initiated Contact with Patient 05/14/21 1152   ?  (approximate) ? ?History  ? ?Chief Complaint: Shortness of Breath ? ?HPI ? ?Gregory Mckenzie is a 46 y.o. male with a past medical history of CHF, hypertension, presents to the emergency department for shortness of breath.  According to the patient for the past 2 months he has not had any of his medications as they did not arrive in the mail like they typically do.  Patient is supposed to be on diuretics as well as blood pressure medication.  Patient states he has noticed increased weight gain fluid accumulation in his legs and abdomen and now he is short of breath.  On arrival patient satting 88% on room air.  States his shortness of breath has been progressive over the last few weeks but is gotten worse recently.  Denies any fever.  Does state occasional cough.  Patient also found to be quite hypertensive 160/110 during my evaluation.  Has been out of his blood pressure medication as well. ? ?Physical Exam  ? ?Triage Vital Signs: ?ED Triage Vitals  ?Enc Vitals Group  ?   BP 05/14/21 1130 (!) 150/92  ?   Pulse Rate 05/14/21 1130 99  ?   Resp 05/14/21 1130 18  ?   Temp 05/14/21 1130 98.8 ?F (37.1 ?C)  ?   Temp Source 05/14/21 1130 Oral  ?   SpO2 05/14/21 1130 (!) 88 %  ?   Weight --   ?   Height --   ?   Head Circumference --   ?   Peak Flow --   ?   Pain Score 05/14/21 1140 0  ?   Pain Loc --   ?   Pain Edu? --   ?   Excl. in [place]? --   ? ? ?Most recent vital signs: ?Vitals:  ? 05/14/21 1140 05/14/21 1150  ?BP:    ?Pulse:  100  ?Resp:  (!) 29  ?Temp:    ?SpO2: 91% 97%  ? ? ?General: Awake, no distress.  ?CV:  Good peripheral perfusion.  Regular rate and rhythm  ?Resp:  Normal effort.  Equal breath sounds bilaterally.  ?Abd:  No distention.  Soft, nontender.  No rebound or guarding. ?Other:  Patient has moderate lower extreme edema, moderate edema of the abdomen as well.  No abdominal  tenderness to palpation.  No lower extremity redness or tenderness. ? ? ?ED Results / Procedures / Treatments  ? ?EKG ? ?EKG viewed and interpreted by myself shows a normal sinus rhythm at 97 bpm with a narrow QRS, right axis deviation, largely normal intervals, nonspecific but no concerning ST changes. ? ?RADIOLOGY ? ?I personally reviewed the chest x-ray images appears to have some haziness in the bilateral hilar areas consistent possible interstitial edema. ?Radiology is read the x-ray as vascular congestion with mild pulmonary edema. ? ? ?MEDICATIONS ORDERED IN ED: ?Medications  ?labetalol (NORMODYNE) injection 10 mg (has no administration in time range)  ? ? ? ?IMPRESSION / MDM / ASSESSMENT AND PLAN / ED COURSE  ?I reviewed the triage vital signs and the nursing notes. ? ?Patient presents to the emergency department for worsening shortness of breath.  Has been out of all of his medications over the past 2 months.  Patient has increased fluid in the lower extremities and abdomen per patient.  Satting 88% on room air satting in  the low 90s on several liters of oxygen.  Highly suspect fluid overload/pulmonary edema to be the cause of the patient's shortness of breath.  We will check labs including cardiac enzymes CBC and a CMP as well as a BNP.  We will obtain a chest x-ray.  Patient is quite hypertensive 160/110 on my evaluation.  We will dose IV labetalol to reduce afterload. ? ?Patient's chest x-ray is resulted concerning for pulmonary edema which fits the patient clinical picture of fluid overload.  Lab work shows slight renal insufficiency on the patient's chemistry.  Hemoglobin is mildly elevated on his CBC.  Troponin of 55.  BNP is elevated to 990.  We will repeat a troponin at the 2-hour mark.  Low suspicion for ACS, suspect elevated troponin likely due to fluid overload and renal insufficiency.  We will start the patient on IV Lasix.  Patient is requiring several liters of oxygen due to respiratory  failure due to acute on chronic CHF exacerbation.  Blood pressure did normalize 122/89 prior to medication intervention.  We will admit to the hospital service for ongoing IV diuresis.  Patient agreeable plan of care. ? ?CRITICAL CARE ?Performed by: Harvest Dark ? ? ?Total critical care time: 30 minutes ? ?Critical care time was exclusive of separately billable procedures and treating other patients. ? ?Critical care was necessary to treat or prevent imminent or life-threatening deterioration. ? ?Critical care was time spent personally by me on the following activities: development of treatment plan with patient and/or surrogate as well as nursing, discussions with consultants, evaluation of patient's response to treatment, examination of patient, obtaining history from patient or surrogate, ordering and performing treatments and interventions, ordering and review of laboratory studies, ordering and review of radiographic studies, pulse oximetry and re-evaluation of patient's condition. ? ? ?FINAL CLINICAL IMPRESSION(S) / ED DIAGNOSES  ? ?Dyspnea ?Hypoxia ?CHF exacerbation ? ?Note:  This document was prepared using Dragon voice recognition software and may include unintentional dictation errors. ?  ?Harvest Dark, MD ?05/14/21 1352 ? ?

## 2021-05-15 ENCOUNTER — Inpatient Hospital Stay
Admit: 2021-05-15 | Discharge: 2021-05-15 | Disposition: A | Discharge status: Home / Self Care | Payer: Self-pay | Attending: Internal Medicine | Admitting: Internal Medicine

## 2021-05-15 LAB — HEMOGLOBIN A1C
Hgb A1c MFr Bld: 6.6 % — ABNORMAL HIGH (ref 4.8–5.6)
Mean Plasma Glucose: 142.72 mg/dL

## 2021-05-15 LAB — BASIC METABOLIC PANEL
Anion gap: 7 (ref 5–15)
BUN: 14 mg/dL (ref 6–20)
CO2: 36 mmol/L — ABNORMAL HIGH (ref 22–32)
Calcium: 8.7 mg/dL — ABNORMAL LOW (ref 8.9–10.3)
Chloride: 98 mmol/L (ref 98–111)
Creatinine, Ser: 1.28 mg/dL — ABNORMAL HIGH (ref 0.61–1.24)
GFR, Estimated: >60 mL/min (ref >60)
Glucose, Bld: 118 mg/dL — ABNORMAL HIGH (ref 70–99)
Potassium: 4.2 mmol/L (ref 3.5–5.1)
Sodium: 141 mmol/L (ref 135–145)

## 2021-05-15 LAB — CBC
HCT: 55.7 % — ABNORMAL HIGH (ref 39.0–52.0)
Hemoglobin: 16.9 g/dL (ref 13.0–17.0)
MCH: 28.9 pg (ref 26.0–34.0)
MCHC: 30.3 g/dL (ref 30.0–36.0)
MCV: 95.4 fL (ref 80.0–100.0)
Platelets: 209 10*3/uL (ref 150–400)
RBC: 5.84 MIL/uL — ABNORMAL HIGH (ref 4.22–5.81)
RDW: 17.5 % — ABNORMAL HIGH (ref 11.5–15.5)
WBC: 3.9 10*3/uL — ABNORMAL LOW (ref 4.0–10.5)
nRBC: 0.5 % — ABNORMAL HIGH (ref 0.0–0.2)

## 2021-05-15 LAB — GLUCOSE, CAPILLARY
Glucose-Capillary: 140 mg/dL — ABNORMAL HIGH (ref 70–99)
Glucose-Capillary: 143 mg/dL — ABNORMAL HIGH (ref 70–99)
Glucose-Capillary: 180 mg/dL — ABNORMAL HIGH (ref 70–99)
Glucose-Capillary: 172 mg/dL — ABNORMAL HIGH (ref 70–99)

## 2021-05-15 LAB — ECHOCARDIOGRAM COMPLETE
Area-P 1/2: 3.97 cm2
Calc EF: 38.3 %
Height: 66 in
S' Lateral: 4.45 cm
Single Plane A2C EF: 29 %
Single Plane A4C EF: 43.3 %
Weight: 5139.36 oz

## 2021-05-15 LAB — HIV ANTIBODY (ROUTINE TESTING W REFLEX): HIV Screen 4th Generation wRfx: NONREACTIVE

## 2021-05-15 MED ORDER — FUROSEMIDE 10 MG/ML IJ SOLN
40.0000 mg | Freq: Two times a day (BID) | INTRAMUSCULAR | Status: DC
  Administered 2021-05-15 – 2021-05-16 (×3): 40 mg via INTRAVENOUS
  Filled 2021-05-15 (×3): qty 4

## 2021-05-15 NOTE — Progress Notes (Signed)
*  PRELIMINARY RESULTS* ?Echocardiogram ?2D Echocardiogram has been performed. ? ?Joelene Millin ?05/15/2021, 1:56 PM ?

## 2021-05-15 NOTE — Progress Notes (Signed)
?  Progress Note ? ? ?Patient: Gregory Mckenzie [id number] DOB: 11-21-1975 DOA: 05/14/2021     1 ?DOS: the patient was seen and examined on 05/15/2021 ?  ?Brief hospital course: ?No notes on file ? ?Assessment and Plan: ?* Acute combined systolic and diastolic heart failure ([place]) ?Patient has a known history of CHF and admits to being noncompliant with prescribed medications due to cost issues. ?Last known LVEF from a 2D echocardiogram done 03/22 showed an LVEF of 40 to 45% ?Placed patient on Lasix 40 mg IV twice daily ?Plan: ?--cont IV lasix 40 BID ?Continue carvedilol, Entresto and spironolactone ?Repeat 2D echocardiogram to assess LVEF ? ?Acute respiratory failure with hypoxia ([place]) ?--CXR showed pulm vascular congestion and pulm edema.  Secondary to acute on chronic combined systolic and diastolic dysfunction CHF ?--Patient had room air pulse oximetry in the low 80s and needed up to 6L O2. ?Plan: ?--cont diuresis ?--Continue supplemental O2 to keep sats >=92%, wean as tolerated ? ? ? ?Non compliance w medication regimen ?Patient admits to being noncompliant with prescribed medications because he couldn't afford his medications ?--will prescribe to med management at discharge ? ?Elevated troponin ?--trop 60's, similar to a year ago ?Secondary to demand ischemia from acute CHF ? ?Chronic kidney disease, stage 3a ([place]) ?Secondary to diabetes mellitus and hypertension ?Renal function is stable ? ?Paroxysmal atrial flutter ([place]) ?Patient is currently in sinus rhythm ?Continue carvedilol for rate control ?Continue Xarelto as primary prophylaxis for an acute stroke ? ?Class 3 severe obesity due to excess calories with serious comorbidity and body mass index (BMI) of 45.0 to 49.9 in adult [facility]) ?Complicates overall prognosis and care ?Lifestyle modification and exercise has been discussed with patient ? ?Type 2 diabetes mellitus with complication, without long-term current use of insulin (HCC) ?--A1c 6.6, well  controlled ?--BG within inpatient goal ?--d/c BG checks and SSI ? ?Hypertension ?Blood pressure is stable ?--cont coreg, Entresto, aldactone ?--cont IV lasix ? ? ? ? ?  ? ?Subjective:  ?Pt was complaining of burning in his nose from the supplemental O2.  Having good urine output in response to IV lasix. ? ? ?Physical Exam: ? ?Constitutional: NAD, AAOx3, anasarca ?HEENT: conjunctivae and lids normal, EOMI ?CV: No cyanosis.   ?RESP: normal respiratory effort, on 3L ?Extremities: edema in BLE ?SKIN: warm, dry ?Neuro: II - XII grossly intact.   ?Psych: Normal mood and affect.  Appropriate judgement and reason ? ? ?Data Reviewed: ? ?Family Communication:  ? ?Disposition: ?Status is: Inpatient ? ? Planned Discharge Destination: Home ? ? ? ?Time spent: 50 minutes ? ?Author: ?Enzo Bi, MD ?05/15/2021 3:39 PM ? ?For on call review [email or web address].  ?

## 2021-05-16 DIAGNOSIS — N179 Acute kidney failure, unspecified: Secondary | ICD-10-CM

## 2021-05-16 DIAGNOSIS — N189 Chronic kidney disease, unspecified: Secondary | ICD-10-CM

## 2021-05-16 LAB — CBC
HCT: 57.4 % — ABNORMAL HIGH (ref 39.0–52.0)
Hemoglobin: 17.3 g/dL — ABNORMAL HIGH (ref 13.0–17.0)
MCH: 28.7 pg (ref 26.0–34.0)
MCHC: 30.1 g/dL (ref 30.0–36.0)
MCV: 95.3 fL (ref 80.0–100.0)
Platelets: 220 10*3/uL (ref 150–400)
RBC: 6.02 MIL/uL — ABNORMAL HIGH (ref 4.22–5.81)
RDW: 17 % — ABNORMAL HIGH (ref 11.5–15.5)
WBC: 5 10*3/uL (ref 4.0–10.5)
nRBC: 1 % — ABNORMAL HIGH (ref 0.0–0.2)

## 2021-05-16 LAB — BASIC METABOLIC PANEL
Anion gap: 6 (ref 5–15)
BUN: 28 mg/dL — ABNORMAL HIGH (ref 6–20)
CO2: 37 mmol/L — ABNORMAL HIGH (ref 22–32)
Calcium: 8.9 mg/dL (ref 8.9–10.3)
Chloride: 93 mmol/L — ABNORMAL LOW (ref 98–111)
Creatinine, Ser: 1.99 mg/dL — ABNORMAL HIGH (ref 0.61–1.24)
GFR, Estimated: 41 mL/min — ABNORMAL LOW (ref >60)
Glucose, Bld: 129 mg/dL — ABNORMAL HIGH (ref 70–99)
Potassium: 4.4 mmol/L (ref 3.5–5.1)
Sodium: 136 mmol/L (ref 135–145)

## 2021-05-16 LAB — MAGNESIUM: Magnesium: 2.2 mg/dL (ref 1.7–2.4)

## 2021-05-16 LAB — GLUCOSE, CAPILLARY: Glucose-Capillary: 130 mg/dL — ABNORMAL HIGH (ref 70–99)

## 2021-05-16 MED ORDER — ORAL CARE MOUTH RINSE
15.0000 mL | Freq: Two times a day (BID) | OROMUCOSAL | Status: DC
  Administered 2021-05-16 – 2021-05-19 (×6): 15 mL via OROMUCOSAL

## 2021-05-16 MED ORDER — CHLORHEXIDINE GLUCONATE 0.12 % MT SOLN
15.0000 mL | Freq: Two times a day (BID) | OROMUCOSAL | Status: DC
  Administered 2021-05-16 – 2021-05-19 (×7): 15 mL via OROMUCOSAL
  Filled 2021-05-16 (×6): qty 15

## 2021-05-16 NOTE — Progress Notes (Signed)
?  Progress Note ? ? ?Patient: Gregory Mckenzie [id number] DOB: Aug 17, 1975 DOA: 05/14/2021     2 ?DOS: the patient was seen and examined on 05/16/2021 ?  ?Brief hospital course: ?No notes on file ? ?Assessment and Plan: ?* Acute combined systolic and diastolic heart failure ([place]) ?Patient has a known history of CHF and admits to being noncompliant with prescribed medications due to cost issues. ?Last known LVEF from a 2D echocardiogram done 03/22 showed an LVEF of 40 to 45%.  Current Echo similar. ?Placed patient on Lasix 40 mg IV twice daily ?Plan: ?--hold diuretic for now due to AKI ? ?Acute respiratory failure with hypoxia ([place]) ?--CXR showed pulm vascular congestion and pulm edema.  Secondary to acute on chronic combined systolic and diastolic dysfunction CHF ?--Patient had room air pulse oximetry in the low 80s and needed up to 6L O2. ?Plan: ?--hold diuretic for now due to AKI ?--Continue supplemental O2 to keep sats >=92%, wean as tolerated ? ? ? ?AKI (acute kidney injury) ([place]) ?--Cr 1.53 on presentation.  Up to 1.99 this morning, likely due to diuresis. ?Plan: ?--hold diuretic for now ? ?Non compliance w medication regimen ?Patient admits to being noncompliant with prescribed medications because he couldn't afford his medications ?--will prescribe to med management at discharge ? ?Elevated troponin ?--trop 60's, similar to a year ago ?Secondary to demand ischemia from acute CHF ? ?Chronic kidney disease, stage 3a ([place]) ?Secondary to diabetes mellitus and hypertension ?Renal function is stable ? ?Paroxysmal atrial flutter ([place]) ?Patient is currently in sinus rhythm ?Continue carvedilol for rate control ?Continue Xarelto as primary prophylaxis for an acute stroke ? ?Class 3 severe obesity due to excess calories with serious comorbidity and body mass index (BMI) of 45.0 to 49.9 in adult [facility]) ?Complicates overall prognosis and care ?Lifestyle modification and exercise has been discussed with patient ? ?Type 2  diabetes mellitus with complication, without long-term current use of insulin (HCC) ?--A1c 6.6, well controlled ?--BG within inpatient goal ?--no need for BG checks and SSI ? ?Hypertension ?Blood pressure is stable ?--cont coreg, Entresto, aldactone ? ? ? ? ?  ? ?Subjective:  ?Pt denied shortness of breath.   ?Cr trending up, so IV lasix on hold. ? ? ?Physical Exam: ? ?Constitutional: NAD, AAOx3, anasarca ?HEENT: conjunctivae and lids normal, EOMI ?CV: No cyanosis.   ?RESP: normal respiratory effort, on 5L ?Extremities: edema in BLE ?SKIN: warm, dry ?Neuro: II - XII grossly intact.   ? ? ?Data Reviewed: ? ?Family Communication:  ? ?Disposition: ?Status is: Inpatient ? ? Planned Discharge Destination: Home ? ? ? ?Time spent: 50 minutes ? ?Author: ?Enzo Bi, MD ?05/16/2021 3:23 PM ? ?For on call review [email or web address].  ?

## 2021-05-16 NOTE — TOC Progression Note (Signed)
Transition of Care (TOC) - Progression Note  ? ? ?Patient Details  ?Name: Gregory Mckenzie ?MRN: [id number] ?Date of Birth: 06-10-1975 ? ?Transition of Care (TOC) CM/SW Contact  ?Bing Quarry, RN ?Phone Number: ?05/16/2021, 3:58 PM ? ?Clinical Narrative:  4/2: CHF exacerbation due not being able to afford medications per rounds. No insurance. Was on 6L/[place] and now down to 3L/[place] this am. EDD several days due to slow diuresing as creatinine levels went up. IF cannot wean may need home oxygen DME at discharge or [facility] (charity) referral for home. Will determine more tomorrow after wean atttempts. Gabriel Cirri RN CM  ? ? ? ?  ?  ? ?Expected Discharge Plan and Services ?  ?  ?  ?  ?  ?                ?  ?  ?  ?  ?  ?  ?  ?  ?  ?  ? ? ?Social Determinants of Health (SDOH) Interventions ?  ? ?Readmission Risk Interventions ? ?  04/10/2020  ? 10:11 AM  ?Readmission Risk Prevention Plan  ?Post Dischage Appt Not Complete  ?Appt Comments First available appt at [facility] is 3/24.  ?Transportation Screening Complete  ? ? ?

## 2021-05-16 NOTE — Assessment & Plan Note (Addendum)
Resolved.  Creatinine 1.00 today. ?Monitor BMP ?

## 2021-05-16 NOTE — Progress Notes (Incomplete)
Off of Cpap, place back on O2 @ 4 liters ?

## 2021-05-17 LAB — BASIC METABOLIC PANEL
Anion gap: 9 (ref 5–15)
BUN: 41 mg/dL — ABNORMAL HIGH (ref 6–20)
CO2: 32 mmol/L (ref 22–32)
Calcium: 8.7 mg/dL — ABNORMAL LOW (ref 8.9–10.3)
Chloride: 93 mmol/L — ABNORMAL LOW (ref 98–111)
Creatinine, Ser: 2.1 mg/dL — ABNORMAL HIGH (ref 0.61–1.24)
GFR, Estimated: 38 mL/min — ABNORMAL LOW (ref >60)
Glucose, Bld: 137 mg/dL — ABNORMAL HIGH (ref 70–99)
Potassium: 4.5 mmol/L (ref 3.5–5.1)
Sodium: 134 mmol/L — ABNORMAL LOW (ref 135–145)

## 2021-05-17 LAB — MAGNESIUM: Magnesium: 2.1 mg/dL (ref 1.7–2.4)

## 2021-05-17 LAB — CBC
HCT: 56.2 % — ABNORMAL HIGH (ref 39.0–52.0)
Hemoglobin: 17.1 g/dL — ABNORMAL HIGH (ref 13.0–17.0)
MCH: 28.7 pg (ref 26.0–34.0)
MCHC: 30.4 g/dL (ref 30.0–36.0)
MCV: 94.3 fL (ref 80.0–100.0)
Platelets: 206 10*3/uL (ref 150–400)
RBC: 5.96 MIL/uL — ABNORMAL HIGH (ref 4.22–5.81)
RDW: 16.6 % — ABNORMAL HIGH (ref 11.5–15.5)
WBC: 4.4 10*3/uL (ref 4.0–10.5)
nRBC: 0.9 % — ABNORMAL HIGH (ref 0.0–0.2)

## 2021-05-17 LAB — ALBUMIN: Albumin: 3.1 g/dL — ABNORMAL LOW (ref 3.5–5.0)

## 2021-05-17 MED ORDER — FUROSEMIDE 10 MG/ML IJ SOLN
4.0000 mg/h | INTRAVENOUS | Status: DC
  Administered 2021-05-17: 4 mg/h via INTRAVENOUS
  Administered 2021-05-19: 6 mg/h via INTRAVENOUS
  Filled 2021-05-17 (×4): qty 20

## 2021-05-17 MED ORDER — FUROSEMIDE 10 MG/ML IJ SOLN
60.0000 mg | Freq: Once | INTRAMUSCULAR | Status: AC
Start: 2021-05-17 — End: 2021-05-17
  Administered 2021-05-17: 60 mg via INTRAVENOUS
  Filled 2021-05-17: qty 6

## 2021-05-17 NOTE — Progress Notes (Signed)
?  Progress Note ? ? ?Patient: Gregory Mckenzie [id number] DOB: 1975-10-09 DOA: 05/14/2021     3 ?DOS: the patient was seen and examined on 05/17/2021 ?  ?Brief hospital course: ?No notes on file ? ?Assessment and Plan: ?* Acute combined systolic and diastolic heart failure ([place]) ?Patient has a known history of CHF and admits to being noncompliant with prescribed medications due to cost issues. ?Last known LVEF from a 2D echocardiogram done 03/22 showed an LVEF of 40 to 45%.  Current Echo similar. ?--presented with hypoxia, anasarca, pulm edema. ?--Cr trending up with IV Lasix diuresis  ?Plan: ?--IV lasix 60 f/b lasix gtt@4  mg/hr ? ?Acute respiratory failure with hypoxia ([place]) ?--CXR showed pulm vascular congestion and pulm edema.  Secondary to acute on chronic combined systolic and diastolic dysfunction CHF ?--Patient had room air pulse oximetry in the low 80s and needed up to 6L O2.  ?Plan: ?--cont diuresis with lasix gtt ?--Continue supplemental O2 to keep sats >=92%, wean as tolerated ? ? ? ?AKI (acute kidney injury) ([place]) ?--Cr 1.53 on presentation, trending up to 2.1 this morning.  However, clearly volume overloaded and needs fluid removal ?Plan: ?--transition to lasix gtt ? ?Non compliance w medication regimen ?Patient admits to being noncompliant with prescribed medications because he couldn't afford his medications ?--will prescribe to med management at discharge ? ?Elevated troponin ?--trop 60's, similar to a year ago ?Secondary to demand ischemia from acute CHF ? ?Chronic kidney disease, stage 3a ([place]) ?Secondary to diabetes mellitus and hypertension ? ?Paroxysmal atrial flutter ([place]) ?Patient is currently in sinus rhythm ?Continue carvedilol for rate control ?Continue Xarelto as primary prophylaxis for an acute stroke ? ?Class 3 severe obesity due to excess calories with serious comorbidity and body mass index (BMI) of 45.0 to 49.9 in adult [facility]) ?Complicates overall prognosis and care ?Lifestyle  modification and exercise has been discussed with patient ? ?Type 2 diabetes mellitus with complication, without long-term current use of insulin (HCC) ?--A1c 6.6, well controlled ?--BG within inpatient goal ?--no need for BG checks and SSI ? ?Hypertension ?Blood pressure is stable ?--cont coreg, Entresto, aldactone ? ? ? ? ?  ? ?Subjective:  ?Poor historian, could not answer questions clearly. ? ? ?Physical Exam: ? ?Constitutional: NAD, sleepy, oriented, sitting in recliner, anasarca ?HEENT: conjunctivae and lids normal, EOMI ?CV: No cyanosis.   ?RESP: normal respiratory effort, on 5L ?Extremities: tense edema in BLE ?SKIN: warm, dry ?Neuro: II - XII grossly intact.   ? ? ?Data Reviewed: ? ?Family Communication:  ? ?Disposition: ?Status is: Inpatient ? ? Planned Discharge Destination: Home ? ? ? ?Time spent: 50 minutes ? ?Author: ?Enzo Bi, MD ?05/17/2021 4:05 PM ? ?For on call review [email or web address].  ?

## 2021-05-17 NOTE — Plan of Care (Signed)
?  Problem: Education: ?Goal: Ability to verbalize understanding of medication therapies will improve ?05/17/2021 2347 by Lydia Guiles, RN ?Outcome: Progressing ?05/17/2021 2346 by Lydia Guiles, RN ?Outcome: Progressing ?  ?Problem: Cardiac: ?Goal: Ability to achieve and maintain adequate cardiopulmonary perfusion will improve ?05/17/2021 2347 by Lydia Guiles, RN ?Outcome: Progressing ?05/17/2021 2346 by Lydia Guiles, RN ?Outcome: Progressing ?  ?Problem: Clinical Measurements: ?Goal: Cardiovascular complication will be avoided ?Outcome: Progressing ?  ?Problem: Clinical Measurements: ?Goal: Respiratory complications will improve ?Outcome: Progressing ?  ?Problem: Activity: ?Goal: Risk for activity intolerance will decrease ?Outcome: Progressing ?  ?Problem: Nutrition: ?Goal: Adequate nutrition will be maintained ?Outcome: Progressing ?  ?Problem: Safety: ?Goal: Ability to remain free from injury will improve ?Outcome: Progressing ?  ?

## 2021-05-17 NOTE — Consult Note (Addendum)
? ?  Heart Failure Nurse Navigator Note ? ?HFrEF 40 to 45%.  Mild left ventricular hypertrophy.  Grade 2 diastolic dysfunction.  Right ventricular systolic function is moderately reduced.  Right ventricular size is moderately enlarged.  Moderate biatrial enlargement.  Mild mitral regurgitation.  Moderate to severe tricuspid regurgitation. ? ?He presented to the emergency room with complaints of worsening shortness of breath and scrotal edema. ? ?Comorbidities: ? ?Hypertension ?Medication noncompliance ?Dietary noncompliance ?Nonobstructive coronary artery disease ? ?Medications: ? ?Atorvastatin 40 mg ?Furosemide infusion at 4 mg an hour ?Xarelto 20 mg daily with supper ?Spironolactone 12 and half milligrams daily ? ?Discontinued medications Coreg 12-1/2 mg 2 times a day with meals  ?Furosemide 40 mg IV 2 times daily ?Entresto 49/51 mg 2 times a day ? ?Labs: ? ?Albumin 3.1, magnesium 2.1, potassium 4.5, sodium 134, chloride 93, BUN 41, creatinine 2.10, estimated GFR 38, hemoglobin 17.1, hematocrit 56.2. ? ?Blood pressure 101/69 ?Intake 370 mL ?Output 1037ml ? ?This morning's weight was documented at 151.9 kg per the bed scale which is up from 147.7 kg.  Standing weight was obtained after breakfast he weighs 144.5 kg or 317.9 pounds.  There was a discrepancy of 16.28 pounds. ? ?Initial meeting with patient today he is currently lying in bed in no acute distress but is still on 5 L of O2 per his nasal cannula. ? ?He states that he is currently living in [place] with his mother and sister.  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  He states that he does not have a scale to weigh himself.  He states that he pretty much eats what he wants.  Gave examples of eating in [place] restaurants 3-4 times a week.  He states he graduated from high school and can read. ? ?Also discussed his fluid intake.  He can drink 6 to 7 cans of 12 ounce cans of Pepsi and 4 to 5-16.9  ounce bottles of water daily.  Soda intake is 2,520 ml and water is 2525 ml this totals over 6,000 ml.  Will reinforce he needs to stick to 2000 ml daily. ? ?Discussed 2000 mg sodium restriction along with 64 ounces of fluid restriction daily. ? ?He was given the living with heart failure teaching booklet, zone magnet information on low-sodium and for documenting weights. ? ?He was last seen in the advanced heart failure clinic in March 2022, he also did not follow directions as to obtaining his medications and sending in his applications. ? ?He has an appointment with the seen in the outpatient heart failure clinic on May 24, 2021 at 3:30 in the afternoon.  He has a 36% ratio of no-show which is 4 out of 11 appointments. ? ?Pricilla Riffle RN CHFN ? ?

## 2021-05-18 LAB — CBC
HCT: 56.9 % — ABNORMAL HIGH (ref 39.0–52.0)
Hemoglobin: 17.4 g/dL — ABNORMAL HIGH (ref 13.0–17.0)
MCH: 28.8 pg (ref 26.0–34.0)
MCHC: 30.6 g/dL (ref 30.0–36.0)
MCV: 94 fL (ref 80.0–100.0)
Platelets: 211 10*3/uL (ref 150–400)
RBC: 6.05 MIL/uL — ABNORMAL HIGH (ref 4.22–5.81)
RDW: 16.2 % — ABNORMAL HIGH (ref 11.5–15.5)
WBC: 4.4 10*3/uL (ref 4.0–10.5)
nRBC: 0.7 % — ABNORMAL HIGH (ref 0.0–0.2)

## 2021-05-18 LAB — BASIC METABOLIC PANEL
Anion gap: 9 (ref 5–15)
BUN: 34 mg/dL — ABNORMAL HIGH (ref 6–20)
CO2: 36 mmol/L — ABNORMAL HIGH (ref 22–32)
Calcium: 8.8 mg/dL — ABNORMAL LOW (ref 8.9–10.3)
Chloride: 93 mmol/L — ABNORMAL LOW (ref 98–111)
Creatinine, Ser: 1.5 mg/dL — ABNORMAL HIGH (ref 0.61–1.24)
GFR, Estimated: 57 mL/min — ABNORMAL LOW (ref >60)
Glucose, Bld: 118 mg/dL — ABNORMAL HIGH (ref 70–99)
Potassium: 4.4 mmol/L (ref 3.5–5.1)
Sodium: 138 mmol/L (ref 135–145)

## 2021-05-18 LAB — MAGNESIUM: Magnesium: 2.1 mg/dL (ref 1.7–2.4)

## 2021-05-18 MED ORDER — CARVEDILOL 6.25 MG PO TABS
6.2500 mg | ORAL_TABLET | Freq: Two times a day (BID) | ORAL | Status: DC
  Administered 2021-05-18 – 2021-05-19 (×3): 6.25 mg via ORAL
  Filled 2021-05-18 (×3): qty 1

## 2021-05-18 NOTE — Hospital Course (Addendum)
46 y.o. male  with history of chronic systolic and diastolic heart failure, HTN, atrial flutter on xarelto and BB, DM2, severe obesity and suspected OSA/possible OHS, ex 20 py smoker who was admitted on 05/14/2021 for acute on chronic hypercapnic respiratory failure in setting of acute on chronic CHF.  Required transfer to ICU on 4/5 on BiPAP and subsequently required intubation and mechanical ventilation. ? ?Significant Events: (taken from ICU notes): ?05/14/21 admitted for acute on chronic systolic and diastolic heart failure, diuresing ?05/19/21: transferred to ICU on NIV trial. Intubated.  ?05/20/21: diuresed well on Lasix gtt and Diamox ?05/21/21: therapeutic phlebotomy performed; remains on vent ?05/22/21: remains on mechanical ventilation ?05/23/21: extubated ? ?TRH assumed care again on 4/11. ? ?Patient still requiring BiPAP often, but stable respiratory status.  Physically deconditioned.  Had failed swallow, SLP following for progress as he continues to improve.  ? ?4/12 - on 3 liters now. Transfer out to PCU ?4/13-PT, OT recommends HH PT, OT ?

## 2021-05-18 NOTE — Progress Notes (Addendum)
? ?  Heart Failure Nurse Navigator Note ? ?Met with patient today, he is just finished eating breakfast.  States that he feels he has lost a lot of weight since being in the hospital. ? ?Talked about why he did not follow-up in the heart failure clinic in [place], and he stated it was because he moved to [place].  Also stated that he did not get his medications because they were supposed to be sent to his home.  Stressed the importance of follow-up in the outpatient heart failure clinic as they will be able to help with forms are needed and applying for indigent programs. ? ?Went over his fluid restriction as he is taking in 3 times what he should be on a daily basis.  Discussed ways to quench "Thirst" as that was his main concern. ? ?We will continue to follow along. ? ?Pricilla Riffle RN CHFN ? ? ?

## 2021-05-18 NOTE — Plan of Care (Signed)
?  Problem: Activity: ?Goal: Capacity to carry out activities will improve ?Outcome: Progressing ?  ?

## 2021-05-18 NOTE — Progress Notes (Signed)
Pt refusing to wear CPAP.  

## 2021-05-18 NOTE — Progress Notes (Signed)
?  Progress Note ? ? ?Patient: Gregory Mckenzie [id number] DOB: August 29, 1975 DOA: 05/14/2021     4 ?DOS: the patient was seen and examined on 05/18/2021 ?  ?Brief hospital course: ?AADIL KOHRING is a 46 y.o. male with medical history significant for chronic systolic heart failure and hypertension who presents to the ER for evaluation of a 1 week history of scrotal swelling associated with shortness of breath and lower extremity swelling. ?He has a history of CHF and admits to being noncompliant with prescribed medications. ? ?Pt was started on IV lasix bolus for diuresis, however, Cr trended up, so switched to lasix gtt with better diuresis and improved Cr. ? ?Assessment and Plan: ?* Acute combined systolic and diastolic heart failure ([place]) ?Patient has a known history of CHF and admits to being noncompliant with prescribed medications due to cost issues. ?Last known LVEF from a 2D echocardiogram done 03/22 showed an LVEF of 40 to 45%.  Current Echo similar. ?--presented with hypoxia, anasarca, pulm edema. ?--Cr trending up with IV Lasix diuresis, so switched to lasix gtt with better results and improvement in Cr. ?Plan: ?--cont Lasix gtt, increase from 4 to 6 mg/hr. ? ?Acute respiratory failure with hypoxia ([place]) ?--CXR showed pulm vascular congestion and pulm edema.  Secondary to acute on chronic combined systolic and diastolic dysfunction CHF ?--Patient had room air pulse oximetry in the low 80s and needed up to 6L O2.  ?Plan: ?--cont diuresis with lasix gtt ?--Continue supplemental O2 to keep sats >=92%, wean as tolerated ? ? ? ?AKI (acute kidney injury) ([place]) ?--Cr 1.53 on presentation, trended up to 2.1.  However, clearly volume overloaded and needs fluid removal.  Cr improved today with continued diuresis.  Pt likely has cardiorenal syndrome. ?Plan: ?--cont lasix gtt for fluid overload ? ?Non compliance w medication regimen ?Patient admits to being noncompliant with prescribed medications because he couldn't  afford his medications ?--will prescribe to med management at discharge ? ?Elevated troponin ?--trop 60's, similar to a year ago ?Secondary to demand ischemia from acute CHF ? ?Chronic kidney disease, stage 3a ([place]) ?Secondary to diabetes mellitus and hypertension ? ?Paroxysmal atrial flutter ([place]) ?Patient is currently in sinus rhythm ?--resume coreg at 6.25 BID ?Continue Xarelto as primary prophylaxis for an acute stroke ? ?Class 3 severe obesity due to excess calories with serious comorbidity and body mass index (BMI) of 45.0 to 49.9 in adult [facility]) ?Complicates overall prognosis and care ?Lifestyle modification and exercise has been discussed with patient ? ?Type 2 diabetes mellitus with complication, without long-term current use of insulin (HCC) ?--A1c 6.6, well controlled ?--BG within inpatient goal ?--no need for BG checks and SSI ? ?Hypertension ?--hold Entresto due to soft BP ?--resume coreg at 6.25 BID ?--cont aldactone and lasix for diuresis ? ? ? ? ?  ? ?Subjective:  ?Pt did well on Lasix gtt, with good urine output and improvement in Cr.  Pt denied dyspnea. ? ? ?Physical Exam: ? ?Constitutional: NAD, AAOx3 ?HEENT: conjunctivae and lids normal, EOMI ?CV: No cyanosis.   ?RESP: normal respiratory effort, on 5L ?Extremities: tense edema in BLE ?SKIN: warm, dry ?Neuro: II - XII grossly intact.   ? ? ?Data Reviewed: ? ?Family Communication:  ? ?Disposition: ?Status is: Inpatient ? ? Planned Discharge Destination: Home will need several more days of diuresis. ? ? ? ?Time spent: 50 minutes ? ?Author: ?Enzo Bi, MD ?05/18/2021 8:55 PM ? ?For on call review [email or web address].  ?

## 2021-05-19 ENCOUNTER — Inpatient Hospital Stay: Payer: Self-pay

## 2021-05-19 DIAGNOSIS — J9601 Acute respiratory failure with hypoxia: Secondary | ICD-10-CM

## 2021-05-19 DIAGNOSIS — N179 Acute kidney failure, unspecified: Secondary | ICD-10-CM

## 2021-05-19 LAB — CBC
HCT: 59.6 % — ABNORMAL HIGH (ref 39.0–52.0)
Hemoglobin: 17.2 g/dL — ABNORMAL HIGH (ref 13.0–17.0)
MCH: 28.3 pg (ref 26.0–34.0)
MCHC: 28.9 g/dL — ABNORMAL LOW (ref 30.0–36.0)
MCV: 98.2 fL (ref 80.0–100.0)
Platelets: 217 10*3/uL (ref 150–400)
RBC: 6.07 MIL/uL — ABNORMAL HIGH (ref 4.22–5.81)
RDW: 17.2 % — ABNORMAL HIGH (ref 11.5–15.5)
WBC: 4.5 10*3/uL (ref 4.0–10.5)
nRBC: 0 % (ref 0.0–0.2)

## 2021-05-19 LAB — BLOOD GAS, ARTERIAL
Acid-Base Excess: 16.3 mmol/L — ABNORMAL HIGH (ref 0.0–2.0)
Bicarbonate: 51.3 mmol/L — ABNORMAL HIGH (ref 20.0–28.0)
Delivery systems: POSITIVE
Expiratory PAP: 6 cmH2O
FIO2: 40 %
Inspiratory PAP: 16 cmH2O
O2 Saturation: 81.4 %
Patient temperature: 37
pCO2 arterial: 117 mmHg (ref 32–48)
pH, Arterial: 7.25 — ABNORMAL LOW (ref 7.35–7.45)
pO2, Arterial: 51 mmHg — ABNORMAL LOW (ref 83–108)

## 2021-05-19 LAB — BASIC METABOLIC PANEL
Anion gap: 7 (ref 5–15)
BUN: 27 mg/dL — ABNORMAL HIGH (ref 6–20)
CO2: 41 mmol/L — ABNORMAL HIGH (ref 22–32)
Calcium: 9 mg/dL (ref 8.9–10.3)
Chloride: 93 mmol/L — ABNORMAL LOW (ref 98–111)
Creatinine, Ser: 1.21 mg/dL (ref 0.61–1.24)
GFR, Estimated: >60 mL/min (ref >60)
Glucose, Bld: 126 mg/dL — ABNORMAL HIGH (ref 70–99)
Potassium: 4.7 mmol/L (ref 3.5–5.1)
Sodium: 141 mmol/L (ref 135–145)

## 2021-05-19 LAB — MAGNESIUM: Magnesium: 2 mg/dL (ref 1.7–2.4)

## 2021-05-19 LAB — GLUCOSE, CAPILLARY: Glucose-Capillary: 131 mg/dL — ABNORMAL HIGH (ref 70–99)

## 2021-05-19 MED ORDER — ETOMIDATE 2 MG/ML IV SOLN: 40.0000 mg | Freq: Once | INTRAVENOUS | Status: DC

## 2021-05-19 MED ORDER — DOCUSATE SODIUM 50 MG/5ML PO LIQD: 100.0000 mg | Freq: Two times a day (BID) | ORAL | Status: DC

## 2021-05-19 MED ORDER — PROPOFOL 1000 MG/100ML IV EMUL
0.0000 ug/kg/min | INTRAVENOUS | Status: DC
  Administered 2021-05-19: 20 ug/kg/min via INTRAVENOUS
  Administered 2021-05-20 (×4): 50 ug/kg/min via INTRAVENOUS
  Administered 2021-05-20: 39.962 ug/kg/min via INTRAVENOUS
  Administered 2021-05-20: 50 ug/kg/min via INTRAVENOUS
  Administered 2021-05-20: 39.962 ug/kg/min via INTRAVENOUS
  Administered 2021-05-20 (×2): 50 ug/kg/min via INTRAVENOUS
  Administered 2021-05-21: 40 ug/kg/min via INTRAVENOUS
  Administered 2021-05-21: 10 ug/kg/min via INTRAVENOUS
  Filled 2021-05-19 (×12): qty 100

## 2021-05-19 MED ORDER — SODIUM CHLORIDE 0.9 % IV SOLN
250.0000 mL | INTRAVENOUS | Status: DC
  Administered 2021-05-20 – 2021-05-23 (×3): 250 mL via INTRAVENOUS

## 2021-05-19 MED ORDER — PROPOFOL 1000 MG/100ML IV EMUL: 5.0000 ug/kg/min | INTRAVENOUS | Status: DC

## 2021-05-19 MED ORDER — PROPOFOL 1000 MG/100ML IV EMUL
INTRAVENOUS | Status: AC
Start: 2021-05-19 — End: 2021-05-20
  Filled 2021-05-19: qty 100

## 2021-05-19 MED ORDER — ETOMIDATE 2 MG/ML IV SOLN
10.0000 mg | Freq: Once | INTRAVENOUS | Status: AC
  Administered 2021-05-19: 10 mg via INTRAVENOUS

## 2021-05-19 MED ORDER — FENTANYL CITRATE PF 50 MCG/ML IJ SOSY
50.0000 ug | PREFILLED_SYRINGE | INTRAMUSCULAR | Status: DC | PRN
  Administered 2021-05-20 (×2): 50 ug via INTRAVENOUS
  Filled 2021-05-19 (×2): qty 1

## 2021-05-19 MED ORDER — MIDAZOLAM HCL 2 MG/2ML IJ SOLN: 2.0000 mg | Freq: Once | INTRAMUSCULAR | Status: AC

## 2021-05-19 MED ORDER — CHLORHEXIDINE GLUCONATE CLOTH 2 % EX PADS
6.0000 | MEDICATED_PAD | Freq: Every day | CUTANEOUS | Status: DC
  Administered 2021-05-19 – 2021-05-28 (×10): 6 via TOPICAL

## 2021-05-19 MED ORDER — ROCURONIUM BROMIDE 10 MG/ML (PF) SYRINGE
120.0000 mg | PREFILLED_SYRINGE | Freq: Once | INTRAVENOUS | Status: AC
  Administered 2021-05-19: 120 mg via INTRAVENOUS

## 2021-05-19 MED ORDER — SACUBITRIL-VALSARTAN 24-26 MG PO TABS
1.0000 | ORAL_TABLET | Freq: Two times a day (BID) | ORAL | Status: DC
  Administered 2021-05-19: 1 via ORAL
  Filled 2021-05-19: qty 1

## 2021-05-19 MED ORDER — ROCURONIUM BROMIDE 10 MG/ML (PF) SYRINGE: 120.0000 mg | PREFILLED_SYRINGE | Freq: Once | INTRAVENOUS | Status: DC

## 2021-05-19 MED ORDER — ROCURONIUM BROMIDE 10 MG/ML (PF) SYRINGE
PREFILLED_SYRINGE | INTRAVENOUS | Status: AC
  Filled 2021-05-19: qty 20

## 2021-05-19 MED ORDER — FENTANYL CITRATE PF 50 MCG/ML IJ SOSY: 50.0000 ug | PREFILLED_SYRINGE | INTRAMUSCULAR | Status: DC | PRN

## 2021-05-19 MED ORDER — FENTANYL CITRATE (PF) 100 MCG/2ML IJ SOLN
100.0000 ug | Freq: Once | INTRAMUSCULAR | Status: DC
Start: 2021-05-19 — End: 2021-05-20

## 2021-05-19 MED ORDER — FENTANYL CITRATE (PF) 100 MCG/2ML IJ SOLN
INTRAMUSCULAR | Status: AC
  Administered 2021-05-19: 100 ug
  Filled 2021-05-19: qty 2

## 2021-05-19 MED ORDER — MIDAZOLAM HCL 2 MG/2ML IJ SOLN
INTRAMUSCULAR | Status: AC
  Administered 2021-05-19: 2 mg via INTRAVENOUS
  Filled 2021-05-19: qty 2

## 2021-05-19 MED ORDER — ETOMIDATE 2 MG/ML IV SOLN
INTRAVENOUS | Status: AC
Start: 2021-05-19 — End: 2021-05-20
  Filled 2021-05-19: qty 20

## 2021-05-19 MED ORDER — MIDAZOLAM HCL 2 MG/2ML IJ SOLN: 2.0000 mg | Freq: Once | INTRAMUSCULAR | Status: DC

## 2021-05-19 MED ORDER — NOREPINEPHRINE 4 MG/250ML-% IV SOLN
2.0000 ug/min | INTRAVENOUS | Status: DC
  Administered 2021-05-20: 2 ug/min via INTRAVENOUS
  Administered 2021-05-22: 4 ug/min via INTRAVENOUS
  Administered 2021-05-22: 2 ug/min via INTRAVENOUS
  Filled 2021-05-19 (×2): qty 250

## 2021-05-19 MED ORDER — POLYETHYLENE GLYCOL 3350 17 G PO PACK: 17.0000 g | PACK | Freq: Every day | ORAL | Status: DC

## 2021-05-19 MED ORDER — FENTANYL CITRATE (PF) 100 MCG/2ML IJ SOLN: 100.0000 ug | Freq: Once | INTRAMUSCULAR | Status: DC

## 2021-05-19 NOTE — Progress Notes (Addendum)
? ?  Heart Failure Nurse Navigator Note ? ?Met with patient today, he was sitting on the side of the bed mains on O2 at 4 L.  States that he continues to urinate a lot. ? ?By teach back method went over daily weights and what to report.  Could tell me that was 5 pounds weight gain within a week, needed reinforcement with what to report for overnight increase. ? ?Gave him a scale from [facility] for home use.  Reinforced that he needs to follow-up as scheduled in the outpatient heart failure clinic. ? ?Pricilla Riffle RN CHFN ?

## 2021-05-19 NOTE — Procedures (Signed)
Intubation Procedure Note ? ?Gregory Mckenzie  ?HR:[id number]  ?1975/03/30 ? ?Date:05/19/21  ?Time:11:43 PM  ? ?Provider Performing:Jordayn Mink Merlene Pulling  ? ? ?Procedure: Intubation ([id number]) ? ?Indication(s) ?Respiratory Failure ? ?Consent ?Risks of the procedure as well as the alternatives and risks of each were explained to the patient and/or caregiver.  Consent for the procedure was obtained and is signed in the bedside chart ? ? ?Anesthesia ?Etomidate 10 mg IV, versed 2 mg IV, rocuronium 120 mg IV ? ? ?Time Out ?Verified patient identification, verified procedure, site/side was marked, verified correct patient position, special equipment/implants available, medications/allergies/relevant history reviewed, required imaging and test results available. ? ? ?Sterile Technique ?Usual hand hygeine, masks, and gloves were used ? ? ?Procedure Description ?Patient positioned in bed supine.  Achieved adequate sedation in setting of his hypercapnia with only 2mg  versed, 10 mg etomidate.  Patient was intubated with endotracheal tube using  glidescope s4 blade .  View was Grade 1 full glottis .  Number of attempts was 1.  Colorimetric CO2 detector was consistent with tracheal placement. ? ? ?Complications/Tolerance ?None; patient tolerated the procedure well. ?Chest X-ray is ordered to verify placement. ? ? ?EBL ?Minimal ? ? ?Specimen(s) ?None  ?

## 2021-05-19 NOTE — Progress Notes (Addendum)
? ?      CROSS COVER NOTE ? ?NAME: [name] ?MRN: [id number] ?DOB : Jun 18, 1975 ? ? ? ?Date of Service ?  05/19/2021  ?HPI/Events of Note ?  1950 ABG resulted pH 7.25 pCO2 112 pO2 51 Bicarb 51.3 ? ?Mr Kempfer was admitted 05/14/21 for shortness of breath, scrotal swelling, and BLE edema. PMH of CHF, hypertension, Type II DM, Obesity, pAF. He is currently being treated for heart failure exacerbation and acute respiratory failure secondary to CHF likely with a component of OHS. ? ?On assessment Mr Lamping is drowsy and confused. He is currently tolerating BiPAP though staff reports he has been intermittently pulling the mask off. At this time he does not have increased work of breathing and oxygen saturation is 94% on BiPAP 18/6 35% FiO2 with TV ~350-400. Lung sounds are diminished on auscultation. ? ?Case discussed with Dr Thora Lance with PCCM and patient will be transferred to ICU due to high risk for intubation.  ?Interventions ?  Plan: ?Continue with trial of Bipap, recheck ABG at 2200 ?Increase lasix drip ?Transfer to PCCM ?   ?  ? ?Bishop Limbo MHA, MSN, FNP-BC ?Nurse Practitioner ?[facility] ?[place] ?Pager [phone] ? ? ?

## 2021-05-19 NOTE — Progress Notes (Addendum)
?  Progress Note ? ? ?Patient: Gregory Mckenzie [id number] DOB: 1975-11-28 DOA: 05/14/2021     5 ?DOS: the patient was seen and examined on 05/19/2021 ?  ?Brief hospital course: ?RAEL PHI is a 46 y.o. male with medical history significant for chronic systolic heart failure and hypertension who presents to the ER for evaluation of a 1 week history of scrotal swelling associated with shortness of breath and lower extremity swelling. ?He has a history of CHF and admits to being noncompliant with prescribed medications. ? ?Pt was started on IV lasix bolus for diuresis, however, Cr trended up, so switched to lasix gtt with better diuresis and improved Cr. ? ?Assessment and Plan: ?* Acute combined systolic and diastolic heart failure ([place]) ?Echocardiogram performed on 05/15/2021 showed ejection fraction 40 to [id number], grade 2 diastolic dysfunction. ?Patient still has significant volume overload, continue IV Lasix drip. ?Continue Coreg, Aldactone, added Entresto. ? ?Acute respiratory failure with hypoxia ([place]) ?This is secondary to acute on chronic congestive heart failure ?Continue oxygen treatment. ? ? ?AKI (acute kidney injury) ([place]) ?Peak creatinine 2.1, current creatinine 1.21, GFR more than 60.  Continue IV Lasix drip. ? ?Non compliance w medication regimen ?Patient states that the he has not been taking his medicine for the last 71-month, due to difficulty to access medical care.  We will send all medication to med management at time of discharge. ? ?Elevated troponin ?Mild elevation of troponin secondary to acute on chronic congestive heart failure ? ?Paroxysmal atrial flutter ([place]) ?Patient is currently in sinus rhythm ?Continue Xarelto and beta-blocker. ? ?Class 3 severe obesity due to excess calories with serious comorbidity and body mass index (BMI) of 45.0 to 49.9 in adult [facility]) ?Diet and exercise advised ? ?Type 2 diabetes mellitus with complication, without long-term current use of insulin ([place]) ?A1c 6.6, well  controlled ? ? ?Hypertension ?Continue Coreg.  Also on diuretics. ? ? ? ? ?  ? ?Subjective:  ?Patient still on high flow oxygen, patient still has signal short of breath with minimal exertion. ?No cough ? ?Physical Exam: ?Vitals:  ? 05/18/21 2121 05/19/21 0016 05/19/21 0350 05/19/21 0357  ?BP: (!) 143/88 (!) 139/92 (!) 141/91   ?Pulse: 91 81 80   ?Resp: 20 20    ?Temp: 99.3 ?F (37.4 ?C) 98.3 ?F (36.8 ?C) 97.7 ?F (36.5 ?C)   ?TempSrc: Oral Oral Oral   ?SpO2: 92% 100% 100%   ?Weight:    (!) 139.3 kg  ?Height:      ? ?General exam: Appears calm and comfortable, morbid obese ?Respiratory system: Clear to auscultation. Respiratory effort normal. ?Cardiovascular system: S1 & S2 heard, RRR. No JVD, murmurs, rubs, gallops or clicks.  ?Gastrointestinal system: Abdomen is nondistended, soft and nontender. No organomegaly or masses felt. Normal bowel sounds heard. ?Central nervous system: Alert and oriented. No focal neurological deficits. ?Extremities: 3+ leg edema ?Skin: No rashes, lesions or ulcers ?Psychiatry: Judgement and insight appear normal. Mood & affect appropriate.  ? ?Data Reviewed: ? ?Reviewed echocardiogram results, all lab results. ? ?Family Communication: sister updated over the phone ? ?Disposition: ?Status is: Inpatient ?Remains inpatient appropriate because: severity of disease, iv treatment ? Planned Discharge Destination: Home with Home Health ? ? ? ?Time spent: 36 minutes ? ?Author: ?Sharen Hones, MD ?05/19/2021 2:56 PM ? ?For on call review [email or web address].  ?

## 2021-05-19 NOTE — Progress Notes (Signed)
Consult from pharmacy for USGPIV for pressor. Please be advised patient currently has 20g x 1.88 USGPIV in L ant FA. ?

## 2021-05-19 NOTE — Progress Notes (Signed)
Mobility Specialist - Progress Note ? ? 05/19/21 1200  ?Mobility  ?Activity Ambulated with assistance in hallway;Dangled on edge of bed;Stood at bedside  ?Level of Assistance Standby assist, set-up cues, supervision of patient - no hands on  ?Assistive Device Other (Comment) ?(IV pole)  ?Distance Ambulated (ft) 80 ft  ?Activity Response Tolerated well  ?$Mobility charge 1 Mobility  ? ? ?Pre-mobility: 91 HR, 76% SpO2 ?Post-mobility: 91 HR, 97% SpO2 ? ? ?Pt sitting EOB on arrival, with [place] donned but disconnected from wall. O2 satting at 76% and was reapplied on 8L to achieve sats of 92%. Pt stood at bedside to utilize urinal prior to ambulation in hallway. Denied dizziness, however noted 2 retro stumbles that pt was able to self-correct. Denied SOB on now 6L, but unable to attain sats during mobility. Pt returned EOB on 4L with lunch tray set up and needs in reach. RN notified.  ? ? ?Kathee Delton ?Mobility Specialist ?05/19/21, 12:56 PM ? ? ? ? ?

## 2021-05-19 NOTE — Consult Note (Incomplete)
? ?NAME:  Gregory Mckenzie, MRN:  [id number], DOB:  11-Nov-1975, LOS: 5 ?ADMISSION DATE:  05/14/2021, CONSULTATION DATE:  05/19/21 ?REFERRING MD:  Chipper Herb, CHIEF COMPLAINT: decreased responsiveness ? ?History of Present Illness:  ?45yM with history of chronic systolic and diastolic heart failure, HTN, atrial flutter on xarelto and BB, DM2, severe obesity and suspected OSA/possible OHS, ex 20 py smoker who is admitted for acute on chronic hypercapnic respiratory failure in setting of acute on chronic CHF. He has been aggressively diuresed over the course of the hospitalization since he was admitted on 3/21 (net negative 10L charted) on lasix gtt. ? ?This evening he was less responsive, ABG was checked 7.25/117/75. He was started on BiPAP 16/6 rate of 14. His repeat ABG showed no change and his IPAP was increased to 18 and PCCM was called. When I assessed him he was arousable with a shake and his tidal volumes were 200-300. I increased his EPAP to 12 and IPAP to 20, held the mask tight to his face and his tidal volumes remained 200-300.  ? ?Pertinent  Medical History  ?Chronic systolic and diastolic heart failure ?HTN ?Atrial flutter ?DM2 ?Severe obesity ?Suspected OSA/possible OHS ? ?Significant Hospital Events: ?Including procedures, antibiotic start and stop dates in addition to other pertinent events   ?05/14/21 admitted for acute on chronic systolic and diastolic heart failure, diuresing ?05/19/21 transferred to ICU for close monitoring while on NIV trial  ? ?Interim History / Subjective:  ? ? ?Objective   ?Blood pressure 131/83, pulse 68, temperature 97.7 ?F (36.5 ?C), temperature source Oral, resp. rate 17, height 5\' 6"  (1.676 m), weight (!) 139.3 kg, SpO2 (!) 85 %. ?   ?   ? ?Intake/Output Summary (Last 24 hours) at 05/19/2021 2140 ?Last data filed at 05/19/2021 2000 ?Gross per 24 hour  ?Intake 998.6 ml  ?Output 3350 ml  ?Net -2351.4 ml  ? ?Filed Weights  ? 05/17/21 1000 05/18/21 0551 05/19/21 0357  ?Weight: (!) 144.5 kg (!)  142.2 kg (!) 139.3 kg  ? ? ?Examination: ?General appearance: 46 y.o., male, drowsy and arousable with loud voice and a shake ?Eyes: PERRL, tracking appropriately ?HENT: NCAT; dry MM ?Neck: Large ?Lungs: distant, equal chest rise ?CV: RRR, no murmur  ?Abdomen: Soft, non-tender; obese, non-distended, BS present  ?Extremities: 2+ peripheral edema, lukewarm ?Skin: Normal turgor and texture; no rash ?Neuro: drowsy and inattentive, grossly nonfocal, +hypercapnic jerks ? ? ?Resolved Hospital Problem list   ? ?Assessment & Plan:  ?# Acute on chronic hypercapnic hypoxic respiratory failure ?# Suspected OHS/OSA ?Likely due to combination of alveolar hypoventilation due to volume overload and underlying OHS/OSA. Not sure why he deteriorated tonight yet.  ?- check CXR, tsh, ft4, UA, urine drug screen ?- continue lasix gtt, may need bolus and drip rate increase vs addition of thiazide tomorrow but will hold off immediately post intubation ?- lung protective ventilation ?- VAP bundle, PAD protocol ?- target RASS -1 ?- daily SAT/SBT ? ?# Acute on chronic systolic and diastolic heart failure ?- diuresis as above ?- hold coreg, entresto, spironolactone for now ? ?# AKI  ?Improving with diuresis ?- trend Bmet, avoid nephrotoxins ?- continue diuresing as above ? ?# Atrial flutter ?- hold coreg as we assess his response to sedation ?- continue xarelto 20 mg daily ? ? # HTN ?- holding coreg for now ? ?# DM2 ?- SSI ? ?Best Practice (right click and "Reselect all SmartList Selections" daily)  ? ?Diet/type: NPO ?DVT prophylaxis: DOAC ?GI prophylaxis: N/A ?  Lines: N/A ?Foley:  Yes, and it is still needed ?Code Status:  full code ?Last date of multidisciplinary goals of care discussion [updated wife Arnetha Courser, spoke with her at bedside about his deteriorating condition despite trial of NIV, diuresis and need for intubation.] ? ?Labs   ?CBC: ?Recent Labs  ?Lab 05/15/21 ?0429 05/16/21 ?1610 05/17/21 ?0441 05/18/21 ?9604 05/19/21 ?5409  ?WBC  3.9* 5.0 4.4 4.4 4.5  ?HGB 16.9 17.3* 17.1* 17.4* 17.2*  ?HCT 55.7* 57.4* 56.2* 56.9* 59.6*  ?MCV 95.4 95.3 94.3 94.0 98.2  ?PLT 209 220 206 211 217  ? ? ?Basic Metabolic Panel: ?Recent Labs  ?Lab 05/15/21 ?0429 05/16/21 ?8119 05/17/21 ?0441 05/18/21 ?1478 05/19/21 ?2956  ?NA 141 136 134* 138 141  ?K 4.2 4.4 4.5 4.4 4.7  ?CL 98 93* 93* 93* 93*  ?CO2 36* 37* 32 36* 41*  ?GLUCOSE 118* 129* 137* 118* 126*  ?BUN 14 28* 41* 34* 27*  ?CREATININE 1.28* 1.99* 2.10* 1.50* 1.21  ?CALCIUM 8.7* 8.9 8.7* 8.8* 9.0  ?MG  --  2.2 2.1 2.1 2.0  ? ?GFR: ?Estimated Creatinine Clearance: 100.3 mL/min (by C-G formula based on SCr of 1.21 mg/dL). ?Recent Labs  ?Lab 05/16/21 ?2130 05/17/21 ?0441 05/18/21 ?8657 05/19/21 ?8469  ?WBC 5.0 4.4 4.4 4.5  ? ? ?Liver Function Tests: ?Recent Labs  ?Lab 05/17/21 ?0441  ?ALBUMIN 3.1*  ? ?No results for input(s): LIPASE, AMYLASE in the last 168 hours. ?No results for input(s): AMMONIA in the last 168 hours. ? ?ABG ?   ?Component Value Date/Time  ? PHART 7.25 (L) 05/19/2021 1950  ? PCO2ART 117 (HH) 05/19/2021 1950  ? PO2ART 51 (L) 05/19/2021 1950  ? HCO3 51.3 (H) 05/19/2021 1950  ? TCO2 31 05/08/2020 1324  ? TCO2 32 05/08/2020 1324  ? O2SAT 81.4 05/19/2021 1950  ?  ? ?Coagulation Profile: ?No results for input(s): INR, PROTIME in the last 168 hours. ? ?Cardiac Enzymes: ?No results for input(s): CKTOTAL, CKMB, CKMBINDEX, TROPONINI in the last 168 hours. ? ?HbA1C: ?Hgb A1c MFr Bld  ?Date/Time Value Ref Range Status  ?05/14/2021 01:28 PM 6.6 (H) 4.8 - 5.6 % Final  ?  Comment:  ?  (NOTE) ?Pre diabetes:          5.7%-6.4% ? ?Diabetes:              >6.4% ? ?Glycemic control for   <7.0% ?adults with diabetes ?  ?04/07/2020 08:41 PM 6.7 (H) 4.8 - 5.6 % Final  ?  Comment:  ?  (NOTE) ?Pre diabetes:          5.7%-6.4% ? ?Diabetes:              >6.4% ? ?Glycemic control for   <7.0% ?adults with diabetes ?  ? ? ?CBG: ?Recent Labs  ?Lab 05/15/21 ?0840 05/15/21 ?1151 05/15/21 ?1638 05/15/21 ?2121 05/16/21 ?1152   ?GLUCAP 140* 143* 180* 172* 130*  ? ? ?Review of Systems:   ?12 point review of systems is negative except as in HPI ? ?Past Medical History:  ?He,  has a past medical history of Hypertension.  ? ?Surgical History:  ? ?Past Surgical History:  ?Procedure Laterality Date  ? RIGHT/LEFT HEART CATH AND CORONARY ANGIOGRAPHY N/A 05/08/2020  ? Procedure: RIGHT/LEFT HEART CATH AND CORONARY ANGIOGRAPHY;  Surgeon: Laurey Morale, MD;  Location: [facility] INVASIVE CV LAB;  Service: Cardiovascular;  Laterality: N/A;  ?  ? ?Social History:  ? reports that he quit smoking about 13 months ago. His  smoking use included cigarettes. He has a 27.00 pack-year smoking history. He has never used smokeless tobacco. He reports that he does not currently use alcohol. He reports current drug use. Frequency: 7.00 times per week. Drug: Marijuana.  ? ?Family History:  ?His family history is not on file.  ? ?Allergies ?No Known Allergies  ? ?Home Medications  ?Prior to Admission medications   ?Medication Sig Start Date End Date Taking? Authorizing Provider  ?atorvastatin (LIPITOR) 40 MG tablet Take 1 tablet (40 mg total) by mouth daily. ?Patient not taking: Reported on 05/14/2021 05/18/20   Laurey Morale, MD  ?atorvastatin (LIPITOR) 40 MG tablet TAKE 1 TABLET (40 MG TOTAL) BY MOUTH AT BEDTIME. ?Patient not taking: Reported on 05/14/2021 04/14/20 04/14/21  Azucena Fallen, MD  ?carvedilol (COREG) 12.5 MG tablet Take 1 tablet (12.5 mg total) by mouth 2 (two) times daily. ?Patient not taking: Reported on 05/14/2021 05/18/20   Laurey Morale, MD  ?carvedilol (COREG) 12.5 MG tablet TAKE 1 TABLET (12.5 MG TOTAL) BY MOUTH TWO TIMES DAILY WITH A MEAL. ?Patient not taking: Reported on 05/14/2021 04/14/20 04/14/21  Belva Agee, MD  ?dapagliflozin propanediol (FARXIGA) 10 MG TABS tablet Take 1 tablet (10 mg total) by mouth daily before breakfast. ?Patient not taking: Reported on 05/14/2021 04/17/20   Angelita Ingles, MD  ?furosemide (LASIX) 20 MG tablet TAKE  1 TABLET (20 MG TOTAL) BY MOUTH DAILY. ?Patient not taking: Reported on 05/14/2021 04/14/20 04/14/21  Belva Agee, MD  ?rivaroxaban (XARELTO) 20 MG TABS tablet Take 1 tablet (20 mg total) by mouth da

## 2021-05-20 ENCOUNTER — Inpatient Hospital Stay: Payer: Self-pay

## 2021-05-20 LAB — BLOOD GAS, ARTERIAL
Acid-Base Excess: 18.8 mmol/L — ABNORMAL HIGH (ref 0.0–2.0)
Bicarbonate: 47.7 mmol/L — ABNORMAL HIGH (ref 20.0–28.0)
FIO2: 50 %
MECHVT: 500 mL
O2 Saturation: 96.7 %
PEEP: 8 cmH2O
Patient temperature: 37
RATE: 20 resp/min
pCO2 arterial: 67 mmHg (ref 32–48)
pH, Arterial: 7.46 — ABNORMAL HIGH (ref 7.35–7.45)
pO2, Arterial: 73 mmHg — ABNORMAL LOW (ref 83–108)

## 2021-05-20 LAB — CBC
HCT: 59.7 % — ABNORMAL HIGH (ref 39.0–52.0)
Hemoglobin: 18.2 g/dL — ABNORMAL HIGH (ref 13.0–17.0)
MCH: 28.6 pg (ref 26.0–34.0)
MCHC: 30.5 g/dL (ref 30.0–36.0)
MCV: 93.9 fL (ref 80.0–100.0)
Platelets: 179 10*3/uL (ref 150–400)
RBC: 6.36 MIL/uL — ABNORMAL HIGH (ref 4.22–5.81)
RDW: 15.7 % — ABNORMAL HIGH (ref 11.5–15.5)
WBC: 4.5 10*3/uL (ref 4.0–10.5)
nRBC: 0.7 % — ABNORMAL HIGH (ref 0.0–0.2)

## 2021-05-20 LAB — BASIC METABOLIC PANEL
Anion gap: 11 (ref 5–15)
BUN: 25 mg/dL — ABNORMAL HIGH (ref 6–20)
CO2: 40 mmol/L — ABNORMAL HIGH (ref 22–32)
Calcium: 9 mg/dL (ref 8.9–10.3)
Chloride: 90 mmol/L — ABNORMAL LOW (ref 98–111)
Creatinine, Ser: 1.32 mg/dL — ABNORMAL HIGH (ref 0.61–1.24)
GFR, Estimated: >60 mL/min (ref >60)
Glucose, Bld: 82 mg/dL (ref 70–99)
Potassium: 3.7 mmol/L (ref 3.5–5.1)
Sodium: 141 mmol/L (ref 135–145)

## 2021-05-20 LAB — GLUCOSE, CAPILLARY
Glucose-Capillary: 79 mg/dL (ref 70–99)
Glucose-Capillary: 97 mg/dL (ref 70–99)
Glucose-Capillary: 131 mg/dL — ABNORMAL HIGH (ref 70–99)
Glucose-Capillary: 149 mg/dL — ABNORMAL HIGH (ref 70–99)
Glucose-Capillary: 162 mg/dL — ABNORMAL HIGH (ref 70–99)

## 2021-05-20 LAB — TRIGLYCERIDES: Triglycerides: 151 mg/dL — ABNORMAL HIGH (ref <150)

## 2021-05-20 LAB — AMMONIA: Ammonia: 62 umol/L — ABNORMAL HIGH (ref 9–35)

## 2021-05-20 LAB — T4, FREE: Free T4: 1.08 ng/dL (ref 0.61–1.12)

## 2021-05-20 LAB — MAGNESIUM: Magnesium: 1.7 mg/dL (ref 1.7–2.4)

## 2021-05-20 LAB — TSH: TSH: 0.758 u[IU]/mL (ref 0.350–4.500)

## 2021-05-20 LAB — MRSA NEXT GEN BY PCR, NASAL: MRSA by PCR Next Gen: NOT DETECTED

## 2021-05-20 MED ORDER — MAGNESIUM SULFATE 2 GM/50ML IV SOLN
2.0000 g | Freq: Once | INTRAVENOUS | Status: AC
  Administered 2021-05-20: 2 g via INTRAVENOUS
  Filled 2021-05-20: qty 50

## 2021-05-20 MED ORDER — DOCUSATE SODIUM 50 MG/5ML PO LIQD
100.0000 mg | Freq: Two times a day (BID) | ORAL | Status: DC
  Administered 2021-05-20 – 2021-05-22 (×6): 100 mg
  Filled 2021-05-20 (×7): qty 10

## 2021-05-20 MED ORDER — POLYETHYLENE GLYCOL 3350 17 G PO PACK
17.0000 g | PACK | Freq: Every day | ORAL | Status: DC
  Administered 2021-05-20 – 2021-05-22 (×3): 17 g
  Filled 2021-05-20 (×4): qty 1

## 2021-05-20 MED ORDER — ADULT MULTIVITAMIN LIQUID CH
15.0000 mL | Freq: Every day | ORAL | Status: DC
  Administered 2021-05-21: 15 mL
  Filled 2021-05-20 (×3): qty 15

## 2021-05-20 MED ORDER — FENTANYL 2500MCG IN NS 250ML (10MCG/ML) PREMIX INFUSION
50.0000 ug/h | INTRAVENOUS | Status: DC
  Administered 2021-05-20: 50 ug/h via INTRAVENOUS
  Administered 2021-05-21 – 2021-05-22 (×2): 75 ug/h via INTRAVENOUS
  Filled 2021-05-20 (×3): qty 250

## 2021-05-20 MED ORDER — POTASSIUM CHLORIDE 10 MEQ/100ML IV SOLN
10.0000 meq | INTRAVENOUS | Status: AC
  Administered 2021-05-20 (×4): 10 meq via INTRAVENOUS
  Filled 2021-05-20 (×4): qty 100

## 2021-05-20 MED ORDER — ACETAMINOPHEN 650 MG RE SUPP: 650.0000 mg | Freq: Four times a day (QID) | RECTAL | Status: DC | PRN

## 2021-05-20 MED ORDER — ATORVASTATIN CALCIUM 20 MG PO TABS
40.0000 mg | ORAL_TABLET | Freq: Every day | ORAL | Status: DC
  Administered 2021-05-20 – 2021-05-22 (×3): 40 mg
  Filled 2021-05-20 (×3): qty 2

## 2021-05-20 MED ORDER — ACETAZOLAMIDE 250 MG PO TABS
500.0000 mg | ORAL_TABLET | Freq: Three times a day (TID) | ORAL | Status: DC
  Administered 2021-05-20: 500 mg via ORAL
  Filled 2021-05-20 (×2): qty 2

## 2021-05-20 MED ORDER — PROSOURCE TF PO LIQD
90.0000 mL | Freq: Every day | ORAL | Status: DC
  Administered 2021-05-20 – 2021-05-21 (×6): 90 mL
  Filled 2021-05-20 (×8): qty 90

## 2021-05-20 MED ORDER — PROSOURCE TF PO LIQD
45.0000 mL | Freq: Two times a day (BID) | ORAL | Status: DC
  Filled 2021-05-20: qty 45

## 2021-05-20 MED ORDER — FENTANYL BOLUS VIA INFUSION
50.0000 ug | INTRAVENOUS | Status: DC | PRN
  Administered 2021-05-20 (×2): 50 ug via INTRAVENOUS
  Filled 2021-05-20: qty 100

## 2021-05-20 MED ORDER — CHLORHEXIDINE GLUCONATE 0.12% ORAL RINSE (MEDLINE KIT)
15.0000 mL | Freq: Two times a day (BID) | OROMUCOSAL | Status: DC
  Administered 2021-05-20 – 2021-05-28 (×17): 15 mL via OROMUCOSAL

## 2021-05-20 MED ORDER — ACETAMINOPHEN 325 MG PO TABS: 650.0000 mg | ORAL_TABLET | Freq: Four times a day (QID) | ORAL | Status: DC | PRN

## 2021-05-20 MED ORDER — VITAL HIGH PROTEIN PO LIQD
1000.0000 mL | ORAL | Status: DC
Start: 2021-05-20 — End: 2021-05-21
  Administered 2021-05-20 – 2021-05-21 (×2): 1000 mL

## 2021-05-20 MED ORDER — FENTANYL CITRATE PF 50 MCG/ML IJ SOSY: 50.0000 ug | PREFILLED_SYRINGE | Freq: Once | INTRAMUSCULAR | Status: DC

## 2021-05-20 MED ORDER — ACETAZOLAMIDE 250 MG PO TABS
500.0000 mg | ORAL_TABLET | Freq: Three times a day (TID) | ORAL | Status: DC
  Administered 2021-05-20 (×2): 500 mg
  Filled 2021-05-20 (×3): qty 2

## 2021-05-20 MED ORDER — FREE WATER
30.0000 mL | Status: DC
  Administered 2021-05-20 – 2021-05-23 (×18): 30 mL

## 2021-05-20 MED ORDER — ORAL CARE MOUTH RINSE
15.0000 mL | OROMUCOSAL | Status: DC
  Administered 2021-05-20 – 2021-05-23 (×33): 15 mL via OROMUCOSAL

## 2021-05-20 MED ORDER — RIVAROXABAN 20 MG PO TABS
20.0000 mg | ORAL_TABLET | Freq: Every day | ORAL | Status: DC
  Administered 2021-05-20: 20 mg
  Filled 2021-05-20: qty 1

## 2021-05-20 MED ORDER — VITAL HIGH PROTEIN PO LIQD: 1000.0000 mL | ORAL | Status: DC

## 2021-05-20 MED ORDER — PANTOPRAZOLE SODIUM 40 MG IV SOLR
40.0000 mg | INTRAVENOUS | Status: DC
  Administered 2021-05-20 – 2021-05-21 (×2): 40 mg via INTRAVENOUS
  Filled 2021-05-20 (×2): qty 10

## 2021-05-20 NOTE — Consult Note (Signed)
? ?NAME:  Gregory Mckenzie, MRN:  [id number], DOB:  [birth year], LOS: 6 ?ADMISSION DATE:  05/14/2021, CONSULTATION DATE:  05/19/21 ?REFERRING MD:  Chipper Herb, CHIEF COMPLAINT:  decreased responsiveness  ? ?History of Present Illness:  ?45yM with history of chronic systolic and diastolic heart failure, HTN, atrial flutter on xarelto and BB, DM2, severe obesity and suspected OSA/possible OHS, ex 20 py smoker who is admitted for acute on chronic hypercapnic respiratory failure in setting of acute on chronic CHF. He has been aggressively diuresed over the course of the hospitalization since he was admitted on 3/21 (net negative 10L charted) on lasix gtt. ?  ?This evening he was less responsive, ABG was checked 7.25/117/75. He was started on BiPAP 16/6 rate of 14. His repeat ABG showed no change and his IPAP was increased to 18 and PCCM was called. When I assessed him he was arousable with a shake and his tidal volumes were 200-300. I increased his EPAP to 12 and IPAP to 20, held the mask tight to his face and his tidal volumes remained 200-300.  ? ?Pertinent  Medical History  ?Chronic systolic and diastolic heart failure ?HTN ?Atrial flutter ?DM2 ?Severe obesity ?Suspected OSA/possible OHS ? ?Significant Hospital Events: ?Including procedures, antibiotic start and stop dates in addition to other pertinent events   ?05/14/21 admitted for acute on chronic systolic and diastolic heart failure, diuresing ?05/19/21 transferred to ICU for close monitoring while on NIV trial. Intubated.  ? ?Interim History / Subjective:  ? ? ?Objective   ?Blood pressure (!) 157/111, pulse 76, temperature 97.7 ?F (36.5 ?C), temperature source Oral, resp. rate (!) 25, height 5' 5.98" (1.676 m), weight (!) 139.3 kg, SpO2 97 %. ?   ?Vent Mode: PRVC ?FiO2 (%):  [30 %-100 %] 50 % ?Set Rate:  [20 bmp] 20 bmp ?Vt Set:  [500 mL] 500 mL ?PEEP:  [8 cmH20] 8 cmH20  ? ?Intake/Output Summary (Last 24 hours) at 05/20/2021 0003 ?Last data filed at 05/19/2021 2000 ?Gross per 24  hour  ?Intake 898.6 ml  ?Output 2850 ml  ?Net -1951.4 ml  ? ?Filed Weights  ? 05/17/21 1000 05/18/21 0551 05/19/21 0357  ?Weight: (!) 144.5 kg (!) 142.2 kg (!) 139.3 kg  ? ? ?Examination: ?General appearance: 46 y.o., male, drowsy and arousable with loud voice and a shake ?Eyes: PERRL, tracking appropriately ?HENT: NCAT; dry MM ?Neck: Large ?Lungs: distant, equal chest rise ?CV: RRR, no murmur  ?Abdomen: Soft, non-tender; obese, non-distended, BS present  ?Extremities: 2+ peripheral edema, lukewarm ?Skin: Normal turgor and texture; no rash ?Neuro: drowsy and inattentive, grossly nonfocal, +hypercapnic jerks ? ?Resolved Hospital Problem list   ? ?Assessment & Plan:  ?# Acute on chronic hypercapnic hypoxic respiratory failure ?# Suspected OHS/OSA ?Likely due to combination of alveolar hypoventilation due to volume overload and underlying OHS/OSA. Not sure why he deteriorated tonight as opposed to other nights yet despite diuresing well.  ?- check CXR ?- if a ton of ETT secretions then check tracheal aspirate ?- continue lasix gtt, may need bolus and drip rate increase vs addition of thiazide tomorrow but will hold off immediately post intubation ?- lung protective ventilation ?- VAP bundle, PAD protocol ?- target RASS -1 ?- daily SAT/SBT ? ?# Acute metabolic encephalopathy: ?- ammonia, tsh, ft4, UA ?- f/u response to mgmt of hypercapnic respiratory failure as above ? ?# Acute on chronic systolic and diastolic heart failure ?- diuresis as above ?- hold coreg, entresto, spironolactone for now ?  ?# AKI  ?  Improving with diuresis ?- trend Bmet, avoid nephrotoxins ?- continue diuresing as above ?  ?# Atrial flutter ?- hold coreg as we assess his response to sedation ?- continue xarelto 20 mg daily ?  ? # HTN ?- holding coreg for now ?  ?# DM2 ?- SSI ? ?Best Practice (right click and "Reselect all SmartList Selections" daily)  ? ?Diet/type: NPO ?DVT prophylaxis: DOAC ?GI prophylaxis: N/A ?Lines: N/A ?Foley:  Yes, and it is  still needed ?Code Status:  full code ?Last date of multidisciplinary goals of care discussion [updated wife Arnetha Courser, spoke with her at bedside about his deteriorating condition despite trial of NIV, diuresis and need for intubation.] ? ?Labs   ?CBC: ?Recent Labs  ?Lab 05/15/21 ?0429 05/16/21 ?7342 05/17/21 ?0441 05/18/21 ?8768 05/19/21 ?1157  ?WBC 3.9* 5.0 4.4 4.4 4.5  ?HGB 16.9 17.3* 17.1* 17.4* 17.2*  ?HCT 55.7* 57.4* 56.2* 56.9* 59.6*  ?MCV 95.4 95.3 94.3 94.0 98.2  ?PLT 209 220 206 211 217  ? ? ?Basic Metabolic Panel: ?Recent Labs  ?Lab 05/15/21 ?0429 05/16/21 ?2620 05/17/21 ?0441 05/18/21 ?3559 05/19/21 ?7416  ?NA 141 136 134* 138 141  ?K 4.2 4.4 4.5 4.4 4.7  ?CL 98 93* 93* 93* 93*  ?CO2 36* 37* 32 36* 41*  ?GLUCOSE 118* 129* 137* 118* 126*  ?BUN 14 28* 41* 34* 27*  ?CREATININE 1.28* 1.99* 2.10* 1.50* 1.21  ?CALCIUM 8.7* 8.9 8.7* 8.8* 9.0  ?MG  --  2.2 2.1 2.1 2.0  ? ?GFR: ?Estimated Creatinine Clearance: 100.3 mL/min (by C-G formula based on SCr of 1.21 mg/dL). ?Recent Labs  ?Lab 05/16/21 ?3845 05/17/21 ?0441 05/18/21 ?3646 05/19/21 ?8032  ?WBC 5.0 4.4 4.4 4.5  ? ? ?Liver Function Tests: ?Recent Labs  ?Lab 05/17/21 ?0441  ?ALBUMIN 3.1*  ? ?No results for input(s): LIPASE, AMYLASE in the last 168 hours. ?No results for input(s): AMMONIA in the last 168 hours. ? ?ABG ?   ?Component Value Date/Time  ? PHART 7.25 (L) 05/19/2021 1950  ? PCO2ART 117 (HH) 05/19/2021 1950  ? PO2ART 51 (L) 05/19/2021 1950  ? HCO3 51.3 (H) 05/19/2021 1950  ? TCO2 31 05/08/2020 1324  ? TCO2 32 05/08/2020 1324  ? O2SAT 81.4 05/19/2021 1950  ?  ? ?Coagulation Profile: ?No results for input(s): INR, PROTIME in the last 168 hours. ? ?Cardiac Enzymes: ?No results for input(s): CKTOTAL, CKMB, CKMBINDEX, TROPONINI in the last 168 hours. ? ?HbA1C: ?Hgb A1c MFr Bld  ?Date/Time Value Ref Range Status  ?05/14/2021 01:28 PM 6.6 (H) 4.8 - 5.6 % Final  ?  Comment:  ?  (NOTE) ?Pre diabetes:          5.7%-6.4% ? ?Diabetes:               >6.4% ? ?Glycemic control for   <7.0% ?adults with diabetes ?  ?04/07/2020 08:41 PM 6.7 (H) 4.8 - 5.6 % Final  ?  Comment:  ?  (NOTE) ?Pre diabetes:          5.7%-6.4% ? ?Diabetes:              >6.4% ? ?Glycemic control for   <7.0% ?adults with diabetes ?  ? ? ?CBG: ?Recent Labs  ?Lab 05/15/21 ?1151 05/15/21 ?1638 05/15/21 ?2121 05/16/21 ?1152 05/19/21 ?2237  ?GLUCAP 143* 180* 172* 130* 131*  ? ? ?Review of Systems:   ?Unable to obtain in setting of his encephalopathy ? ?Past Medical History:  ?He,  has a past medical history of Hypertension.  ? ?  Surgical History:  ? ?Past Surgical History:  ?Procedure Laterality Date  ? RIGHT/LEFT HEART CATH AND CORONARY ANGIOGRAPHY N/A 05/08/2020  ? Procedure: RIGHT/LEFT HEART CATH AND CORONARY ANGIOGRAPHY;  Surgeon: Laurey Morale, MD;  Location: [facility] INVASIVE CV LAB;  Service: Cardiovascular;  Laterality: N/A;  ?  ? ?Social History:  ? reports that he quit smoking about 13 months ago. His smoking use included cigarettes. He has a 27.00 pack-year smoking history. He has never used smokeless tobacco. He reports that he does not currently use alcohol. He reports current drug use. Frequency: 7.00 times per week. Drug: Marijuana.  ? ?Family History:  ?His family history is not on file.  ? ?Allergies ?No Known Allergies  ? ?Home Medications  ?Prior to Admission medications   ?Medication Sig Start Date End Date Taking? Authorizing Provider  ?atorvastatin (LIPITOR) 40 MG tablet Take 1 tablet (40 mg total) by mouth daily. ?Patient not taking: Reported on 05/14/2021 05/18/20   Laurey Morale, MD  ?atorvastatin (LIPITOR) 40 MG tablet TAKE 1 TABLET (40 MG TOTAL) BY MOUTH AT BEDTIME. ?Patient not taking: Reported on 05/14/2021 04/14/20 04/14/21  Azucena Fallen, MD  ?carvedilol (COREG) 12.5 MG tablet Take 1 tablet (12.5 mg total) by mouth 2 (two) times daily. ?Patient not taking: Reported on 05/14/2021 05/18/20   Laurey Morale, MD  ?carvedilol (COREG) 12.5 MG tablet TAKE 1 TABLET (12.5 MG  TOTAL) BY MOUTH TWO TIMES DAILY WITH A MEAL. ?Patient not taking: Reported on 05/14/2021 04/14/20 04/14/21  Belva Agee, MD  ?dapagliflozin propanediol (FARXIGA) 10 MG TABS tablet Take 1 tablet (10 mg total) by

## 2021-05-20 NOTE — Progress Notes (Signed)
ABG ordered for patient. Upon arrival, patient remains on Bipap with poor seal and low tidal volumes. Patient with decreased level of responsiveness. Adjusted mask. ABG drawn and critical value reported. Adjusted Bipap settings. Orders to transfer to ICU. Patient subsequently transferred, where decision was made to intubate. Patient was intubated by Dr. Thora Lance using the glidescope with a #4 blade. An 8.0 ET tube was placed and secured at 24cm at the right lip. Breath sounds equal bilaterally. Positive color change on EtCO2. CXR ordered and verified. Patient placed on mechanical ventilator. Will continue to monitor.  ?

## 2021-05-20 NOTE — Consult Note (Signed)
PHARMACY CONSULT NOTE ? ?Pharmacy Consult for Electrolyte Monitoring and Replacement  ? ?Recent Labs: ?Potassium (mmol/L)  ?Date Value  ?05/20/2021 3.7  ? ?Magnesium (mg/dL)  ?Date Value  ?05/20/2021 1.7  ? ?Calcium (mg/dL)  ?Date Value  ?05/20/2021 9.0  ? ?Albumin (g/dL)  ?Date Value  ?05/17/2021 3.1 (L)  ? ?Sodium (mmol/L)  ?Date Value  ?05/20/2021 141  ? ?Assessment: ?Patient is a 46 y/o M with medical history including combined systolic and diastolic CHF, HTN, Aflutter on Xarelto, DM, severe obesity, suspected OSA/possible OHS, ex 20 py smoker who is admitted with acute on chronic respiratory failure secondary to acute CHF. Patient initially admitted 3/31 but decompensated 4/5 overnight and ultimately he was intubated. Pharmacy consulted to assist with electrolyte monitoring and replacement as indicated. ? ?Tube feeds started 4/6 ?Lasix gtt at 10 mg/hr + Diamox per tube ? ?Goal of Therapy:  ?Electrolytes within normal limits ? ?Plan:  ?--K 3.7, IV Kcl 10 mEq x 4 runs per PCCM ?--Mg 1.7, will give IV magnesium sulfate 2 g x 1 ?--Follow-up electrolytes with AM labs tomorrow ? ?Tressie Ellis ?05/20/2021 11:00 AM  ?

## 2021-05-20 NOTE — TOC Progression Note (Signed)
Transition of Care (TOC) - Progression Note  ? ? ?Patient Details  ?Name: Gregory Mckenzie ?MRN: [id number] ?Date of Birth: 12/29/1975 ? ?Transition of Care (TOC) CM/SW Contact  ?Shelbie Hutching, RN ?Phone Number: ?05/20/2021, 11:06 AM ? ?Clinical Narrative:    ?Transferred to ICU yesterday, required intubation and is currently on the ventilator and sedated.   ? ? ?  ?  ? ?Expected Discharge Plan and Services ?  ?  ?  ?  ?  ?                ?  ?  ?  ?  ?  ?  ?  ?  ?  ?  ? ? ?Social Determinants of Health (SDOH) Interventions ?  ? ?Readmission Risk Interventions ? ?  04/10/2020  ? 10:11 AM  ?Readmission Risk Prevention Plan  ?Post Dischage Appt Not Complete  ?Appt Comments First available appt at [place] is 3/24.  ?Transportation Screening Complete  ? ? ?

## 2021-05-20 NOTE — Progress Notes (Signed)
Initial Nutrition Assessment ? ?DOCUMENTATION CODES:  ? ?Morbid obesity ? ?INTERVENTION:  ? ?Vital HP @20ml /hr + ProSource TF- 52ml five times daily via tube  ? ?Free water flushes 76ml q4 hours to maintain tube patency  ? ?Propofol: 41.8 ml/hr- provides 1103kcal/day  ? ?Regimen provides 1983kcal/day, 152g/day protein and 561ml/day of free water.  ? ?Liquid MVI daily via tube  ? ?NUTRITION DIAGNOSIS:  ? ?Inadequate oral intake related to inability to eat (pt sedated and ventilated) as evidenced by NPO status. ? ?GOAL:  ? ?Provide needs based on ASPEN/SCCM guidelines ? ?MONITOR:  ? ?Vent status, Labs, Weight trends, Skin, I & O's, TF tolerance ? ?REASON FOR ASSESSMENT:  ? ?Consult ?Enteral/tube feeding initiation and management ? ?ASSESSMENT:  ? ?46 y/o male with h/o CHF, HTN, CKD III, PAF, DM, morbid obesity, medication non-compliance and marijuna use who is admitted with CHF. ? ?Pt sedated and ventilated. OGT in place. Tube feed protocol initiated. Pt is documented to be eating fair prior to intubation. Per chart, pt appears to be up ~49lbs from his UBW. Pt is down 18lbs since admission.  ? ?Medications reviewed and include: colace, miralax, lasix, KCl, propofol  ? ?Labs reviewed: K 3.7 wnl, BUN 25(H), creat 1.32(H), Mg 1.7 wnl ? ?Patient is currently intubated on ventilator support ?MV: 9.9 L/min ?Temp (24hrs), Avg:97.9 ?F (36.6 ?C), Min:97.4 ?F (36.3 ?C), Max:98.3 ?F (36.8 ?C) ? ?Propofol: 41.8 ml/hr- provides 1103kcal/day  ? ?MAP- >73mmHg  ? ?UOP- 5259ml  ? ?NUTRITION - FOCUSED PHYSICAL EXAM: ? ?Flowsheet Row Most Recent Value  ?Orbital Region No depletion  ?Upper Arm Region No depletion  ?Thoracic and Lumbar Region No depletion  ?Buccal Region No depletion  ?Temple Region No depletion  ?Clavicle Bone Region No depletion  ?Clavicle and Acromion Bone Region No depletion  ?Scapular Bone Region No depletion  ?Dorsal Hand No depletion  ?Patellar Region No depletion  ?Anterior Thigh Region No depletion  ?Posterior  Calf Region No depletion  ?Edema (RD Assessment) Moderate  ?Hair Reviewed  ?Eyes Reviewed  ?Mouth Reviewed  ?Skin Reviewed  ?Nails Reviewed  ? ?Diet Order:   ?Diet Order   ? ? None  ? ?  ? ?EDUCATION NEEDS:  ? ?Not appropriate for education at this time ? ?Skin:  Skin Assessment: Reviewed RN Assessment ? ?Last BM:  4/3- TYPE 4 ? ?Height:  ? ?Ht Readings from Last 1 Encounters:  ?05/20/21 5' 5.98" (1.676 m)  ? ? ?Weight:  ? ?Wt Readings from Last 1 Encounters:  ?05/20/21 (!) 137.9 kg  ? ? ?Ideal Body Weight:  64.5 kg ? ?BMI:  Body mass index is 49.09 kg/m?. ? ?Estimated Nutritional Needs:  ? ?Kcal:  1516-1930kcal/day ? ?Protein:  160g/day ? ?Fluid:  2.0L/day ? ?Koleen Distance MS, RD, LDN ?Please refer to [facility] for RD and/or RD on-call/weekend/after hours pager ? ?

## 2021-05-20 NOTE — Progress Notes (Signed)
? ?NAME:  Gregory Mckenzie, MRN:  [id number], DOB:  08/03/1975, LOS: 6 ?ADMISSION DATE:  05/14/2021, CONSULTATION DATE:  05/19/21 ?REFERRING MD:  Chipper Herb, CHIEF COMPLAINT:  decreased responsiveness  ? ?History of Present Illness:  ?58 y M with history of chronic systolic and diastolic heart failure, HTN, atrial flutter on xarelto and BB, DM2, severe obesity and suspected OSA/possible OHS, ex 20 py smoker who is admitted for acute on chronic hypercapnic respiratory failure in setting of acute on chronic CHF. He has been aggressively diuresed over the course of the hospitalization since he was admitted on 3/21 (net negative 10L charted) on lasix gtt. ?  ?This evening he was less responsive, ABG was checked 7.25/117/75. He was started on BiPAP 16/6 rate of 14. His repeat ABG showed no change and his IPAP was increased to 18 and PCCM was called. When I assessed him he was arousable with a shake and his tidal volumes were 200-300. I increased his EPAP to 12 and IPAP to 20, held the mask tight to his face and his tidal volumes remained 200-300.  ? ?Pertinent  Medical History  ?Chronic systolic and diastolic heart failure ?HTN ?Atrial flutter ?DM2 ?Severe obesity ?Suspected OSA/possible OHS ? ?Significant Hospital Events: ?Including procedures, antibiotic start and stop dates in addition to other pertinent events   ?05/14/21 admitted for acute on chronic systolic and diastolic heart failure, diuresing ?05/19/21 transferred to ICU for close monitoring while on NIV trial. Intubated.  ? ?Interim History / Subjective:  ?Patient remains on Lasix infusion, diuresing very well ?Remains on full mechanical ventilatory support, afebrile ? ?Objective   ?Blood pressure 121/73, pulse 70, temperature 98.3 ?F (36.8 ?C), temperature source Axillary, resp. rate 20, height 5' 5.98" (1.676 m), weight (!) 137.9 kg, SpO2 92 %. ?   ?Vent Mode: PRVC ?FiO2 (%):  [30 %-100 %] 50 % ?Set Rate:  [20 bmp] 20 bmp ?Vt Set:  [500 mL] 500 mL ?PEEP:  [8 cmH20] 8  cmH20  ? ?Intake/Output Summary (Last 24 hours) at 05/20/2021 0825 ?Last data filed at 05/20/2021 0600 ?Gross per 24 hour  ?Intake 1119.53 ml  ?Output 5250 ml  ?Net -4130.47 ml  ? ?Filed Weights  ? 05/18/21 0551 05/19/21 0357 05/20/21 0500  ?Weight: (!) 142.2 kg (!) 139.3 kg (!) 137.9 kg  ? ? ?Examination: ?  ?Physical exam: ?General: Crtitically ill-appearing morbidly obese male, orally intubated ?HEENT: [place]/AT, eyes anicteric.  ETT and OGT in place ?Neuro: Sedated, not following commands.  Eyes are closed.  Pupils 3 mm bilateral reactive to light ?Chest: Bilateral basal crackles, no wheezes or rhonchi ?Heart: Regular rate and rhythm, no murmurs or gallops ?Abdomen: Soft, nontender, nondistended, bowel sounds present ?Skin: No rash ?Extremities: 3+ pitting edema up to mid thigh ? ?Resolved Hospital Problem list   ? ?Assessment & Plan:  ?Acute on chronic hypercapnic hypoxic respiratory failure ?Acute respiratory acidosis and metabolic alkalosis ?Suspected OHS/OSA ?Likely due to combination of alveolar hypoventilation due to underlying OHS/OSA and volume overload ?FiO2 was titrated down to 50%, remains on PEEP of 8 ?PCO2 came down to 65 from 112, With improvement in pH ?Continue lung protective ventilation ?VAP bundle, PAD protocol ?Target RASS -1, currently on propofol and fentanyl ? ?Acute metabolic encephalopathy due to hypercapnia ?Now patient is sedated ?Hypercapnia has corrected ?Avoid deep sedation ? ?Acute on chronic biventricular systolic and diastolic heart failure ?Continue aggressive diuresis, currently on Lasix infusion at 10 mg/h ?Acetazolamide was added to help decrease serum bicarbonate in the setting  of aggressive diuretic therapy ?Hold coreg, entresto, spironolactone for now ?  ?AKI, likely due to cardiorenal syndrome ?Serum creatinine continue to improve with diuresis ?Trend Bmet, avoid nephrotoxins ?Continue diuresing as above ?  ?Paroxysmal atrial flutter ?Hold coreg as we assess his response to  sedation ?Continue xarelto 20 mg daily for stroke prophylaxis ?  ?HTN, controlled ?Holding oral antihypertensive for now ?  ?Poorly controlled diabetes type 2 with hyperglycemia ?Added Lantus 15 units daily ?Continue sliding scale insulin with CBG goal 140-180 ? ?Morbid obesity ?Starting on tube feeds ?Dietitian consulted ? ?Best Practice (right click and "Reselect all SmartList Selections" daily)  ? ?Diet/type: NPO feeds ?DVT prophylaxis: DOAC ?GI prophylaxis: N/A ?Lines: N/A ?Foley:  Yes, and it is still needed ?Code Status:  full code ?Last date of multidisciplinary goals of care discussion 60/5 [updated wife Arnetha Courser, spoke with her at bedside about his deteriorating condition despite trial of NIV, diuresis and need for intubation.] ? ?Labs   ?CBC: ?Recent Labs  ?Lab 05/16/21 ?9758 05/17/21 ?0441 05/18/21 ?8325 05/19/21 ?4982 05/20/21 ?6415  ?WBC 5.0 4.4 4.4 4.5 4.5  ?HGB 17.3* 17.1* 17.4* 17.2* 18.2*  ?HCT 57.4* 56.2* 56.9* 59.6* 59.7*  ?MCV 95.3 94.3 94.0 98.2 93.9  ?PLT 220 206 211 217 179  ? ? ?Basic Metabolic Panel: ?Recent Labs  ?Lab 05/16/21 ?8309 05/17/21 ?0441 05/18/21 ?4076 05/19/21 ?8088 05/20/21 ?1103  ?NA 136 134* 138 141 141  ?K 4.4 4.5 4.4 4.7 3.7  ?CL 93* 93* 93* 93* 90*  ?CO2 37* 32 36* 41* 40*  ?GLUCOSE 129* 137* 118* 126* 82  ?BUN 28* 41* 34* 27* 25*  ?CREATININE 1.99* 2.10* 1.50* 1.21 1.32*  ?CALCIUM 8.9 8.7* 8.8* 9.0 9.0  ?MG 2.2 2.1 2.1 2.0 1.7  ? ?GFR: ?Estimated Creatinine Clearance: 91.4 mL/min (A) (by C-G formula based on SCr of 1.32 mg/dL (H)). ?Recent Labs  ?Lab 05/17/21 ?0441 05/18/21 ?1594 05/19/21 ?5859 05/20/21 ?2924  ?WBC 4.4 4.4 4.5 4.5  ? ? ?Liver Function Tests: ?Recent Labs  ?Lab 05/17/21 ?0441  ?ALBUMIN 3.1*  ? ?No results for input(s): LIPASE, AMYLASE in the last 168 hours. ?Recent Labs  ?Lab 05/20/21 ?0609  ?AMMONIA 62*  ? ? ?ABG ?   ?Component Value Date/Time  ? PHART 7.46 (H) 05/20/2021 0010  ? PCO2ART 67 (HH) 05/20/2021 0010  ? PO2ART 73 (L) 05/20/2021 0010  ? HCO3  47.7 (H) 05/20/2021 0010  ? TCO2 31 05/08/2020 1324  ? TCO2 32 05/08/2020 1324  ? O2SAT 96.7 05/20/2021 0010  ?  ? ?Coagulation Profile: ?No results for input(s): INR, PROTIME in the last 168 hours. ? ?Cardiac Enzymes: ?No results for input(s): CKTOTAL, CKMB, CKMBINDEX, TROPONINI in the last 168 hours. ? ?HbA1C: ?Hgb A1c MFr Bld  ?Date/Time Value Ref Range Status  ?05/14/2021 01:28 PM 6.6 (H) 4.8 - 5.6 % Final  ?  Comment:  ?  (NOTE) ?Pre diabetes:          5.7%-6.4% ? ?Diabetes:              >6.4% ? ?Glycemic control for   <7.0% ?adults with diabetes ?  ?04/07/2020 08:41 PM 6.7 (H) 4.8 - 5.6 % Final  ?  Comment:  ?  (NOTE) ?Pre diabetes:          5.7%-6.4% ? ?Diabetes:              >6.4% ? ?Glycemic control for   <7.0% ?adults with diabetes ?  ? ? ?CBG: ?Recent Labs  ?  Lab 05/15/21 ?1151 05/15/21 ?1638 05/15/21 ?2121 05/16/21 ?1152 05/19/21 ?2237  ?GLUCAP 143* 180* 172* 130* 131*  ? ?Critical care time:  ?  ? ?Total critical care time: 37 minutes ? ?Performed by: Cheri Fowler ?  ?Critical care time was exclusive of separately billable procedures and treating other patients. ?  ?Critical care was necessary to treat or prevent imminent or life-threatening deterioration. ?  ?Critical care was time spent personally by me on the following activities: development of treatment plan with patient and/or surrogate as well as nursing, discussions with consultants, evaluation of patient's response to treatment, examination of patient, obtaining history from patient or surrogate, ordering and performing treatments and interventions, ordering and review of laboratory studies, ordering and review of radiographic studies, pulse oximetry and re-evaluation of patient's condition. ?  ?Cheri Fowler MD ?[facility] ?See Amion for pager ?If no response to pager, please call [phone] until 7pm ?After 7pm, Please call E-link [phone] ? ? ?  ?

## 2021-05-20 NOTE — Plan of Care (Signed)
?  Problem: Education: ?Goal: Ability to demonstrate management of disease process will improve ?Outcome: Not Progressing ?Goal: Ability to verbalize understanding of medication therapies will improve ?Outcome: Not Progressing ?Goal: Individualized Educational Video(s) ?Outcome: Not Progressing ?  ?Problem: Activity: ?Goal: Capacity to carry out activities will improve ?Outcome: Not Progressing ?  ?Problem: Cardiac: ?Goal: Ability to achieve and maintain adequate cardiopulmonary perfusion will improve ?Outcome: Not Progressing ?  ?Problem: Education: ?Goal: Knowledge of General Education information will improve ?Description: Including pain rating scale, medication(s)/side effects and non-pharmacologic comfort measures ?Outcome: Not Progressing ?  ?Problem: Health Behavior/Discharge Planning: ?Goal: Ability to manage health-related needs will improve ?Outcome: Not Progressing ?  ?Problem: Clinical Measurements: ?Goal: Respiratory complications will improve ?Outcome: Not Progressing ?Goal: Cardiovascular complication will be avoided ?Outcome: Not Progressing ?  ?Problem: Activity: ?Goal: Risk for activity intolerance will decrease ?Outcome: Not Progressing ?  ?Problem: Nutrition: ?Goal: Adequate nutrition will be maintained ?Outcome: Not Progressing ?  ?Problem: Safety: ?Goal: Ability to remain free from injury will improve ?Outcome: Not Progressing ?  ?

## 2021-05-21 ENCOUNTER — Inpatient Hospital Stay: Payer: Self-pay

## 2021-05-21 DIAGNOSIS — D751 Secondary polycythemia: Secondary | ICD-10-CM

## 2021-05-21 LAB — MAGNESIUM: Magnesium: 2.4 mg/dL (ref 1.7–2.4)

## 2021-05-21 LAB — PROTIME-INR
INR: 2.1 — ABNORMAL HIGH (ref 0.8–1.2)
Prothrombin Time: 23.1 seconds — ABNORMAL HIGH (ref 11.4–15.2)

## 2021-05-21 LAB — COMPREHENSIVE METABOLIC PANEL
ALT: 19 U/L (ref 0–44)
AST: 20 U/L (ref 15–41)
Albumin: 3.2 g/dL — ABNORMAL LOW (ref 3.5–5.0)
Alkaline Phosphatase: 91 U/L (ref 38–126)
Anion gap: 12 (ref 5–15)
BUN: 30 mg/dL — ABNORMAL HIGH (ref 6–20)
CO2: 32 mmol/L (ref 22–32)
Calcium: 9.1 mg/dL (ref 8.9–10.3)
Chloride: 93 mmol/L — ABNORMAL LOW (ref 98–111)
Creatinine, Ser: 1.59 mg/dL — ABNORMAL HIGH (ref 0.61–1.24)
GFR, Estimated: 54 mL/min — ABNORMAL LOW (ref >60)
Glucose, Bld: 138 mg/dL — ABNORMAL HIGH (ref 70–99)
Potassium: 3.5 mmol/L (ref 3.5–5.1)
Sodium: 137 mmol/L (ref 135–145)
Total Bilirubin: 1.3 mg/dL — ABNORMAL HIGH (ref 0.3–1.2)
Total Protein: 6.9 g/dL (ref 6.5–8.1)

## 2021-05-21 LAB — CBC
HCT: 60.7 % — ABNORMAL HIGH (ref 39.0–52.0)
HCT: 57.7 % — ABNORMAL HIGH (ref 39.0–52.0)
Hemoglobin: 19.2 g/dL — ABNORMAL HIGH (ref 13.0–17.0)
Hemoglobin: 18 g/dL — ABNORMAL HIGH (ref 13.0–17.0)
MCH: 28.5 pg (ref 26.0–34.0)
MCH: 28.4 pg (ref 26.0–34.0)
MCHC: 31.6 g/dL (ref 30.0–36.0)
MCHC: 31.2 g/dL (ref 30.0–36.0)
MCV: 90.1 fL (ref 80.0–100.0)
MCV: 91.2 fL (ref 80.0–100.0)
Platelets: 205 10*3/uL (ref 150–400)
Platelets: 203 10*3/uL (ref 150–400)
RBC: 6.74 MIL/uL — ABNORMAL HIGH (ref 4.22–5.81)
RBC: 6.33 MIL/uL — ABNORMAL HIGH (ref 4.22–5.81)
RDW: 17.2 % — ABNORMAL HIGH (ref 11.5–15.5)
RDW: 16.7 % — ABNORMAL HIGH (ref 11.5–15.5)
WBC: 6.9 10*3/uL (ref 4.0–10.5)
WBC: 8.7 10*3/uL (ref 4.0–10.5)
nRBC: 0.4 % — ABNORMAL HIGH (ref 0.0–0.2)
nRBC: 0.3 % — ABNORMAL HIGH (ref 0.0–0.2)

## 2021-05-21 LAB — GLUCOSE, CAPILLARY
Glucose-Capillary: 172 mg/dL — ABNORMAL HIGH (ref 70–99)
Glucose-Capillary: 179 mg/dL — ABNORMAL HIGH (ref 70–99)
Glucose-Capillary: 198 mg/dL — ABNORMAL HIGH (ref 70–99)
Glucose-Capillary: 125 mg/dL — ABNORMAL HIGH (ref 70–99)
Glucose-Capillary: 187 mg/dL — ABNORMAL HIGH (ref 70–99)
Glucose-Capillary: 137 mg/dL — ABNORMAL HIGH (ref 70–99)

## 2021-05-21 LAB — BRAIN NATRIURETIC PEPTIDE: B Natriuretic Peptide: 380.5 pg/mL — ABNORMAL HIGH (ref 0.0–100.0)

## 2021-05-21 LAB — APTT
aPTT: 36 seconds (ref 24–36)
aPTT: 52 seconds — ABNORMAL HIGH (ref 24–36)

## 2021-05-21 LAB — PHOSPHORUS: Phosphorus: 2.9 mg/dL (ref 2.5–4.6)

## 2021-05-21 MED ORDER — HEPARIN BOLUS VIA INFUSION
2000.0000 [IU] | Freq: Once | INTRAVENOUS | Status: AC
  Administered 2021-05-22: 2000 [IU] via INTRAVENOUS
  Filled 2021-05-21: qty 2000

## 2021-05-21 MED ORDER — PROSOURCE TF PO LIQD
90.0000 mL | Freq: Four times a day (QID) | ORAL | Status: DC
  Administered 2021-05-21 – 2021-05-22 (×6): 90 mL

## 2021-05-21 MED ORDER — INSULIN ASPART 100 UNIT/ML IJ SOLN
0.0000 [IU] | INTRAMUSCULAR | Status: DC
  Administered 2021-05-21: 3 [IU] via SUBCUTANEOUS
  Administered 2021-05-21: 4 [IU] via SUBCUTANEOUS
  Administered 2021-05-21: 3 [IU] via SUBCUTANEOUS
  Administered 2021-05-21 – 2021-05-22 (×2): 4 [IU] via SUBCUTANEOUS
  Administered 2021-05-22 – 2021-05-25 (×12): 3 [IU] via SUBCUTANEOUS
  Administered 2021-05-26: 2 [IU] via SUBCUTANEOUS
  Administered 2021-05-26 – 2021-05-27 (×3): 3 [IU] via SUBCUTANEOUS
  Administered 2021-05-27: 4 [IU] via SUBCUTANEOUS
  Administered 2021-05-27 – 2021-05-28 (×2): 3 [IU] via SUBCUTANEOUS
  Filled 2021-05-21 (×23): qty 1

## 2021-05-21 MED ORDER — VANCOMYCIN HCL IN DEXTROSE 1-5 GM/200ML-% IV SOLN
1000.0000 mg | Freq: Once | INTRAVENOUS | Status: AC
  Administered 2021-05-21: 1000 mg via INTRAVENOUS
  Filled 2021-05-21: qty 200

## 2021-05-21 MED ORDER — HEPARIN (PORCINE) 25000 UT/250ML-% IV SOLN
2100.0000 [IU]/h | INTRAVENOUS | Status: DC
  Administered 2021-05-21: 1400 [IU]/h via INTRAVENOUS
  Administered 2021-05-22: 1600 [IU]/h via INTRAVENOUS
  Administered 2021-05-22: 1900 [IU]/h via INTRAVENOUS
  Administered 2021-05-23 – 2021-05-25 (×5): 2100 [IU]/h via INTRAVENOUS
  Filled 2021-05-21 (×8): qty 250

## 2021-05-21 MED ORDER — VANCOMYCIN HCL 10 G IV SOLR
2500.0000 mg | Freq: Once | INTRAVENOUS | Status: DC
  Filled 2021-05-21: qty 25

## 2021-05-21 MED ORDER — POTASSIUM CHLORIDE 20 MEQ PO PACK
40.0000 meq | PACK | ORAL | Status: AC
  Administered 2021-05-21 (×2): 40 meq via ORAL
  Filled 2021-05-21 (×2): qty 2

## 2021-05-21 MED ORDER — VANCOMYCIN HCL 1500 MG/300ML IV SOLN
1500.0000 mg | Freq: Once | INTRAVENOUS | Status: AC
  Administered 2021-05-21: 1500 mg via INTRAVENOUS
  Filled 2021-05-21: qty 300

## 2021-05-21 MED ORDER — POLYVINYL ALCOHOL 1.4 % OP SOLN
1.0000 [drp] | OPHTHALMIC | Status: DC | PRN
  Administered 2021-05-22 – 2021-05-26 (×2): 1 [drp] via OPHTHALMIC
  Filled 2021-05-21: qty 15

## 2021-05-21 MED ORDER — SALINE SPRAY 0.65 % NA SOLN
2.0000 | Freq: Three times a day (TID) | NASAL | Status: DC
  Administered 2021-05-21 – 2021-05-28 (×21): 2 via NASAL
  Filled 2021-05-21: qty 44

## 2021-05-21 MED ORDER — HEPARIN BOLUS VIA INFUSION
4000.0000 [IU] | Freq: Once | INTRAVENOUS | Status: AC
  Administered 2021-05-21: 4000 [IU] via INTRAVENOUS
  Filled 2021-05-21: qty 4000

## 2021-05-21 MED ORDER — PIPERACILLIN-TAZOBACTAM 3.375 G IVPB
3.3750 g | Freq: Three times a day (TID) | INTRAVENOUS | Status: DC
  Administered 2021-05-21 – 2021-05-23 (×6): 3.375 g via INTRAVENOUS
  Filled 2021-05-21 (×6): qty 50

## 2021-05-21 MED ORDER — LACTATED RINGERS IV BOLUS
500.0000 mL | Freq: Once | INTRAVENOUS | Status: AC
  Administered 2021-05-21: 500 mL via INTRAVENOUS

## 2021-05-21 MED ORDER — VITAL 1.5 CAL PO LIQD
1000.0000 mL | ORAL | Status: DC
  Administered 2021-05-21 – 2021-05-22 (×3): 1000 mL

## 2021-05-21 NOTE — Consult Note (Signed)
ANTICOAGULATION CONSULT NOTE ? ?Pharmacy Consult for IV Heparin ?Indication: atrial fibrillation ? ?Patient Measurements: ?Height: 5' 5.98" (167.6 cm) ?Weight: (!) 137.9 kg (304 lb 0.2 oz) ?IBW/kg (Calculated) : 63.76 ?Heparin Dosing Weight: 97 kg ? ?Labs: ?Recent Labs  ?  05/19/21 ?[id number] 05/20/21 ?[id number] 05/21/21 ?[id number] 05/21/21 ?[id number] 05/21/21 ?1713 05/21/21 ?2308  ?HGB 17.2* 18.2* 19.2*  --  18.0*  --   ?HCT 59.6* 59.7* 60.7*  --  57.7*  --   ?PLT 217 179 205  --  203  --   ?APTT  --   --   --  36  --  52*  ?LABPROT  --   --   --  23.1*  --   --   ?INR  --   --   --  2.1*  --   --   ?CREATININE 1.21 1.32* 1.59*  --   --   --   ? ? ? ?Estimated Creatinine Clearance: 75.9 mL/min (A) (by C-G formula based on SCr of 1.59 mg/dL (H)). ? ?Medications:  ?Xarelto 20 mg qSupper (last dose 4/6) ? ?Assessment: ?Patient is a 46 y/o M with medical history including combined systolic and diastolic CHF, HTN, Aflutter on Xarelto, DM, severe obesity, suspected OSA/possible OHS, ex 20 py smoker who is admitted with acute on chronic respiratory failure secondary to acute CHF. Patient initially admitted 3/31 but decompensated 4/5 overnight and ultimately he was intubated. Xarelto has been placed on hold while patient is in the ICU. Pharmacy has been consulted to initiate IV heparin in the meantime for Afib. ? ?Baseline aPTT and PT-INR are pending. Expect baseline anti-Xa level to be elevated secondary to Xarelto therapy. Baseline CBC notable for polycythemia. ?  ? ?Goal of Therapy:  ?Heparin level 0.3-0.7 units/ml ?aPTT 66-102 seconds ?Monitor platelets by anticoagulation protocol: Yes ? ?4/07 2308 aPTT 52, subtherapeutic ? ?  ?Plan:  ?--Bolus 2000 units x 1 ?--Increase heparin infusion to 1600 units/hr ?--aPTT ~ 6 hours after rate change ?--Daily CBC per protocol while on IV heparin ? ?Renda Rolls, PharmD, MBA ?05/21/2021 ?11:52 PM ? ? ? ?

## 2021-05-21 NOTE — Procedures (Signed)
Arterial Catheter Insertion Procedure Note ? ?Gregory Mckenzie  ?[id number]  ?1975-04-28 ? ?Date:05/21/21  ?Time:5:31 PM  ? ? ?Provider Performing: Marcelo Baldy  ? ? ?Procedure: Insertion of Arterial Line (94854) with US guidance (62703)  ? ?Indication(s) ?Blood pressure monitoring and/or need for frequent ABGs ? ?Consent ?Risks of the procedure as well as the alternatives and risks of each were explained to the patient and/or caregiver.  Consent for the procedure was obtained and is signed in the bedside chart ? ?Anesthesia ?None ? ? ?Time Out ?Verified patient identification, verified procedure, site/side was marked, verified correct patient position, special equipment/implants available, medications/allergies/relevant history reviewed, required imaging and test results available. ? ? ?Sterile Technique ?Maximal sterile technique including full sterile barrier drape, hand hygiene, sterile gown, sterile gloves, mask, hair covering, sterile ultrasound probe cover (if used). ? ? ?Procedure Description ?Area of catheter insertion was cleaned with chlorhexidine and draped in sterile fashion. With real-time ultrasound guidance an arterial catheter was placed into the right radial artery. Phlebotomy was performed with removal of 500 cc of blood for polycythemia. The arterial catheter was removed post-procedurally.  ? ? ?Complications/Tolerance ?None; patient tolerated the procedure well. ? ? ?EBL ?500 cc ? ? ?Specimen(s) ?None ? ?Marcelo Baldy, MD 05/21/21 5:32 PM   ?

## 2021-05-21 NOTE — Progress Notes (Signed)
Pharmacy Antibiotic Note ? ?Gregory Mckenzie is a 46 y.o. male admitted on [id number] with complicated sinusitis.  Pharmacy has been consulted for Zosyn and vancomycin dosing ? ?-MRSA PCR negative. MD wants 1 dose of Vancomycin for now, f/u sinus/trach cultures ? ?Plan: ?-Will order Zosyn 3.375gm EI IV q8h ? ?-Will order Vancomycin 2500mg  IV x 1 loading dose ? ?f/u cultures, Scr ? ? ?Height: 5' 5.98" (167.6 cm) ?Weight: (!) 137.9 kg (304 lb 0.2 oz) ?IBW/kg (Calculated) : 63.76 ? ?Temp (24hrs), Avg:99.1 ?F (37.3 ?C), Min:97.2 ?F (36.2 ?C), Max:100 ?F (37.8 ?C) ? ?Recent Labs  ?Lab 05/17/21 ?0441 05/18/21 ?[id number] 05/19/21 ?[id number] 05/20/21 ?[id number] 05/21/21 ?[id number] 05/21/21 ?1713  ?WBC 4.4 4.4 4.5 4.5 6.9 8.7  ?CREATININE 2.10* 1.50* 1.21 1.32* 1.59*  --   ?  ?Estimated Creatinine Clearance: 75.9 mL/min (A) (by C-G formula based on SCr of 1.59 mg/dL (H)).   ? ?No Known Allergies ? ?Antimicrobials this admission: ?Zosyn 4/7 >>   ?Vancomycin x 1 4/7  ? ?Dose adjustments this admission: ?  ? ?Microbiology results: ?  BCx:   ?  UCx:    ?4/7 Sputum: from trach:    ?4/5 MRSA PCR: negative ? ?Thank you for allowing pharmacy to be a part of this patient?s care. ? ?Tashiana Lamarca A ?05/21/2021 5:59 PM ? ?

## 2021-05-21 NOTE — Progress Notes (Incomplete)
0800: Patient in bed resting, heavily sedated. Per MD start weaning sedation and attempt SBT.  ?0940: All sedation off, patient drowsy. RT attempt SBT, however patient  ?

## 2021-05-21 NOTE — Progress Notes (Signed)
Nutrition Follow Up Note  ? ?DOCUMENTATION CODES:  ? ?Morbid obesity ? ?INTERVENTION:  ? ?Change to Vital 1.5 @50ml /hr + ProSource TF- 83m QID via tube  ? ?Free water flushes 383mq4 hours to maintain tube patency  ? ?Regimen provides 2120kcal/day, 169g/day protein and 109754may of free water.  ? ?NUTRITION DIAGNOSIS:  ? ?Inadequate oral intake related to inability to eat (pt sedated and ventilated) as evidenced by NPO status. ? ?GOAL:  ? ?Provide needs based on ASPEN/SCCM guidelines ?-met  ? ?MONITOR:  ? ?Vent status, Labs, Weight trends, Skin, I & O's, TF tolerance ? ?ASSESSMENT:  ? ?[age] male with h/o CHF, HTN, CKD III, PAF, DM, morbid obesity, medication non-compliance and marijuna use who is admitted with CHF. ? ?Pt remains sedated and ventilated. OGT in place; pt tolerating tube feeds at goal rate. Propofol discontinued; will adjust tube feeds. Refeed labs stable. Per chart, pt down ~20lbs since admission.  ? ?Medications reviewed and include: colace, heparin, insulin, protonix, miralax ? ?Labs reviewed: Na 137 wnl, K 3.5 wnl, BUN 30(H), creat 1.59(H), P 2.9 wnl, Mg 2.4 wnl ?Hgb 19.2(H), Hct 60.7(H) ?Cbgs- 198, 179, 172 x 24 hrs ? ?Patient is currently intubated on ventilator support ?MV: 8.9 L/min ?Temp (24hrs), Avg:99.2 ?F (37.3 ?C), Min:98.1 ?F (36.7 ?C), Max:100 ?F (37.8 ?C) ? ?Propofol: none  ? ?MAP- >61m50m ? ?UOP- 9550ml53m?Diet Order:   ?Diet Order   ? ? None  ? ?  ? ?EDUCATION NEEDS:  ? ?Not appropriate for education at this time ? ?Skin:  Skin Assessment: Reviewed RN Assessment ? ?Last BM:  4/7- type 7 ? ?Height:  ? ?Ht Readings from Last 1 Encounters:  ?05/21/21 5' 5.98" (1.676 m)  ? ? ?Weight:  ? ?Wt Readings from Last 1 Encounters:  ?05/20/21 (!) 137.9 kg  ? ? ?Ideal Body Weight:  64.5 kg ? ?BMI:  Body mass index is 49.09 kg/m?. ? ?Estimated Nutritional Needs:  ? ?Kcal:  1516-1930kcal/day ? ?Protein:  160g/day ? ?Fluid:  2.0L/day ? ?CaseyKoleen DistanceRD, LDN ?Please refer to AMION for RD  and/or RD on-call/weekend/after hours pager ? ?

## 2021-05-21 NOTE — Progress Notes (Signed)
Et tube repositioned to 29 cm at lip. Xray confirmed placement. BBS very diminished on left side. O2 sats noted marginally at 90% on 100%. Lavage/bag for large amount of thick yellow secretions. O2 saturation markedly increased per return to vent on 100%. Returned back to original setting of 50%. Patient tolerated interventions well.  ?

## 2021-05-21 NOTE — Consult Note (Signed)
PHARMACY CONSULT NOTE ? ?Pharmacy Consult for Electrolyte Monitoring and Replacement  ? ?Recent Labs: ?Potassium (mmol/L)  ?Date Value  ?05/21/2021 3.5  ? ?Magnesium (mg/dL)  ?Date Value  ?05/21/2021 2.4  ? ?Calcium (mg/dL)  ?Date Value  ?05/21/2021 9.1  ? ?Albumin (g/dL)  ?Date Value  ?05/21/2021 3.2 (L)  ? ?Phosphorus (mg/dL)  ?Date Value  ?05/21/2021 2.9  ? ?Sodium (mmol/L)  ?Date Value  ?05/21/2021 137  ? ?Assessment: ?Patient is a 46 y/o M with medical history including combined systolic and diastolic CHF, HTN, Aflutter on Xarelto, DM, severe obesity, suspected OSA/possible OHS, ex 20 py smoker who is admitted with acute on chronic respiratory failure secondary to acute CHF. Patient initially admitted 3/31 but decompensated 4/5 overnight and ultimately he was intubated. Pharmacy consulted to assist with electrolyte monitoring and replacement as indicated. ? ?Tube feeds started 4/6 ?Lasix gtt at 10 mg/hr + Diamox per tube ? ?Goal of Therapy:  ?Electrolytes within normal limits ? ?Plan:  ?--K 3.5, Kcl 40 mEq per tube x 2 doses ?--Follow-up electrolytes with AM labs tomorrow ? ?Gregory Mckenzie ?05/21/2021 7:32 AM  ?

## 2021-05-21 NOTE — Consult Note (Signed)
ANTICOAGULATION CONSULT NOTE ? ?Pharmacy Consult for IV Heparin ?Indication: atrial fibrillation ? ?Patient Measurements: ?Height: 5' 5.98" (167.6 cm) ?Weight: (!) 137.9 kg (304 lb 0.2 oz) ?IBW/kg (Calculated) : 63.76 ?Heparin Dosing Weight: 97 kg ? ?Labs: ?Recent Labs  ?  05/19/21 ?8841 05/20/21 ?6606 05/21/21 ?3016  ?HGB 17.2* 18.2* 19.2*  ?HCT 59.6* 59.7* 60.7*  ?PLT 217 179 205  ?CREATININE 1.21 1.32* 1.59*  ? ? ?Estimated Creatinine Clearance: 75.9 mL/min (A) (by C-G formula based on SCr of 1.59 mg/dL (H)). ? ?Medications:  ?Xarelto 20 mg qSupper (last dose 4/6) ? ?Assessment: ?Patient is a 46 y/o M with medical history including combined systolic and diastolic CHF, HTN, Aflutter on Xarelto, DM, severe obesity, suspected OSA/possible OHS, ex 20 py smoker who is admitted with acute on chronic respiratory failure secondary to acute CHF. Patient initially admitted 3/31 but decompensated 4/5 overnight and ultimately he was intubated. Xarelto has been placed on hold while patient is in the ICU. Pharmacy has been consulted to initiate IV heparin in the meantime for Afib. ? ?Baseline aPTT and PT-INR are pending. Expect baseline anti-Xa level to be elevated secondary to Xarelto therapy. Baseline CBC notable for polycythemia. ?  ? ?Goal of Therapy:  ?Heparin level 0.3-0.7 units/ml ?aPTT 66-102 seconds ?Monitor platelets by anticoagulation protocol: Yes ?  ?Plan:  ?--Initiate heparin 4/7 at 1700 (~24h from last Xarelto dose) ?--At that time, will start with IV heparin 4000 unit IV bolus followed by continuous infusion at 1400 units/hr ?--aPTT 6 hours after initiation of infusion ?--Daily CBC per protocol while on IV heparin ? ?Tressie Ellis ?05/21/2021,7:50 AM ? ? ?

## 2021-05-21 NOTE — Progress Notes (Signed)
? ?NAME:  Gregory Mckenzie, MRN:  [id number], DOB:  [birth year], LOS: 7 ?ADMISSION DATE:  05/14/2021, CONSULTATION DATE:  05/19/21 ?REFERRING MD:  Chipper Herb, CHIEF COMPLAINT:  decreased responsiveness  ? ?History of Present Illness:  ?46 y.o. male  with history of chronic systolic and diastolic heart failure, HTN, atrial flutter on xarelto and BB, DM2, severe obesity and suspected OSA/possible OHS, ex 20 py smoker who is admitted for acute on chronic hypercapnic respiratory failure in setting of acute on chronic CHF. He has been aggressively diuresed over the course of the hospitalization since he was admitted on 3/21 (net negative 10L charted) on lasix gtt. ?  ?The evening of his transfer, he was less responsive, ABG was checked 7.25/117/75. He was started on BiPAP 16/6 rate of 14. His repeat ABG showed no change and his IPAP was increased to 18 and PCCM was called. When I assessed him he was arousable with a shake and his tidal volumes were 200-300. I increased his EPAP to 12 and IPAP to 20, held the mask tight to his face and his tidal volumes remained 200-300.  ? ?Pertinent  Medical History  ?Chronic systolic and diastolic heart failure ?HTN ?Atrial flutter ?DM2 ?Severe obesity ?Suspected OSA/possible OHS ? ?Significant Hospital Events: ?Including procedures, antibiotic start and stop dates in addition to other pertinent events   ?05/14/21 admitted for acute on chronic systolic and diastolic heart failure, diuresing ?05/19/21: transferred to ICU for close monitoring while on NIV trial. Intubated.  ?05/20/21: diuresed well on Lasix gtt and Diamox ? ?Interim History / Subjective:  ?Remains intubated and sedated. Significant urine output the last 2 days; creatinine bumped slightly. BNP down to 380. Now has pretty impressive polycythemia. He is over-sedated this AM. Now on minimal vent settings; appropriate for SAT/SBT.  ? ?Objective   ?Blood pressure (!) 108/45, pulse (!) 46, temperature 99.5 ?F (37.5 ?C), resp. rate 19, height 5'  5.98" (1.676 m), weight (!) 137.9 kg, SpO2 95 %. ?   ?Vent Mode: PRVC ?FiO2 (%):  [50 %-75 %] 50 % ?Set Rate:  [20 bmp] 20 bmp ?Vt Set:  [500 mL] 500 mL ?PEEP:  [8 cmH20] 8 cmH20  ? ?Intake/Output Summary (Last 24 hours) at 05/21/2021 0911 ?Last data filed at 05/21/2021 0900 ?Gross per 24 hour  ?Intake 2568.75 ml  ?Output 8050 ml  ?Net -5481.25 ml  ? ?Filed Weights  ? 05/18/21 0551 05/19/21 0357 05/20/21 0500  ?Weight: (!) 142.2 kg (!) 139.3 kg (!) 137.9 kg  ? ? ?Examination: ?  ?Physical exam: ?General: Crtitically ill-appearing morbidly obese male, orally intubated ?HEENT: [place]/AT, eyes anicteric.  ETT and OGT in place ?Neuro: intubated and sedated ?Chest: CTA b/l, no wheezes or rhonchi ?Heart: Regular rate and rhythm, no murmurs or gallops ?Abdomen: Soft, nontender, nondistended, bowel sounds present ?Skin: No rash ?Extremities: 1+ pitting edema of BLE ? ?Resolved Hospital Problem list   ? ?Assessment & Plan:  ?Acute on chronic hypercapnic hypoxic respiratory failure ?Acute respiratory acidosis and metabolic alkalosis ?Acute metabolic encephalopathy due to hypercapnia ?Suspected OHS/OSA ?Likely due to combination of alveolar hypoventilation due to underlying OHS/OSA and volume overload. ?Wean ventilator settings ?VAP bundle, PAD protocol ?PRN bronchodilators ?Target RASS 0, currently on propofol and fentanyl ?Target even to slightly negative fluid balance today ? ?Acute on chronic biventricular systolic and diastolic heart failure ?BNP down to 380 ?Hold on Lasix drip today; PRN Lasix to maintain euvolemia ?Hold coreg, entresto, spironolactone for now ?  ?AKI ?Serum creatinine bumped; hold off  on aggressive diuresis today ?Trend Bmet, avoid nephrotoxins ?Continue diuresing as above ?  ?Paroxysmal atrial flutter ?Hold coreg as we assess his response to sedation ?Switch to heparin gtt for Afib ?  ?HTN, controlled ?Holding oral antihypertensive for now ?  ?Poorly controlled diabetes type 2 with hyperglycemia ?Sliding scale  insulin with CBG goal 140-180 ? ?Morbid obesity ?Tube feeds ?Dietitian consulted ? ?Best Practice (right click and "Reselect all SmartList Selections" daily)  ? ?Diet/type: NPO feeds ?DVT prophylaxis: systemic heparin ?GI prophylaxis: N/A ?Lines: N/A ?Foley:  Yes, and it is still needed ?Code Status:  full code ?Last date of multidisciplinary goals of care discussion 05/21/21 ?Labs   ?CBC: ?Recent Labs  ?Lab 05/17/21 ?0441 05/18/21 ?2778 05/19/21 ?2423 05/20/21 ?5361 05/21/21 ?4431  ?WBC 4.4 4.4 4.5 4.5 6.9  ?HGB 17.1* 17.4* 17.2* 18.2* 19.2*  ?HCT 56.2* 56.9* 59.6* 59.7* 60.7*  ?MCV 94.3 94.0 98.2 93.9 90.1  ?PLT 206 211 217 179 205  ? ? ?Basic Metabolic Panel: ?Recent Labs  ?Lab 05/17/21 ?0441 05/18/21 ?5400 05/19/21 ?8676 05/20/21 ?1950 05/21/21 ?9326  ?NA 134* 138 141 141 137  ?K 4.5 4.4 4.7 3.7 3.5  ?CL 93* 93* 93* 90* 93*  ?CO2 32 36* 41* 40* 32  ?GLUCOSE 137* 118* 126* 82 138*  ?BUN 41* 34* 27* 25* 30*  ?CREATININE 2.10* 1.50* 1.21 1.32* 1.59*  ?CALCIUM 8.7* 8.8* 9.0 9.0 9.1  ?MG 2.1 2.1 2.0 1.7 2.4  ?PHOS  --   --   --   --  2.9  ? ?GFR: ?Estimated Creatinine Clearance: 75.9 mL/min (A) (by C-G formula based on SCr of 1.59 mg/dL (H)). ?Recent Labs  ?Lab 05/18/21 ?7124 05/19/21 ?5809 05/20/21 ?9833 05/21/21 ?8250  ?WBC 4.4 4.5 4.5 6.9  ? ? ?Liver Function Tests: ?Recent Labs  ?Lab 05/17/21 ?0441 05/21/21 ?5397  ?AST  --  20  ?ALT  --  19  ?ALKPHOS  --  91  ?BILITOT  --  1.3*  ?PROT  --  6.9  ?ALBUMIN 3.1* 3.2*  ? ?No results for input(s): LIPASE, AMYLASE in the last 168 hours. ?Recent Labs  ?Lab 05/20/21 ?0609  ?AMMONIA 62*  ? ? ?ABG ?   ?Component Value Date/Time  ? PHART 7.46 (H) 05/20/2021 0010  ? PCO2ART 67 (HH) 05/20/2021 0010  ? PO2ART 73 (L) 05/20/2021 0010  ? HCO3 47.7 (H) 05/20/2021 0010  ? TCO2 31 05/08/2020 1324  ? TCO2 32 05/08/2020 1324  ? O2SAT 96.7 05/20/2021 0010  ?  ? ?Coagulation Profile: ?No results for input(s): INR, PROTIME in the last 168 hours. ? ?Cardiac Enzymes: ?No results for input(s):  CKTOTAL, CKMB, CKMBINDEX, TROPONINI in the last 168 hours. ? ?HbA1C: ?Hgb A1c MFr Bld  ?Date/Time Value Ref Range Status  ?05/14/2021 01:28 PM 6.6 (H) 4.8 - 5.6 % Final  ?  Comment:  ?  (NOTE) ?Pre diabetes:          5.7%-6.4% ? ?Diabetes:              >6.4% ? ?Glycemic control for   <7.0% ?adults with diabetes ?  ?04/07/2020 08:41 PM 6.7 (H) 4.8 - 5.6 % Final  ?  Comment:  ?  (NOTE) ?Pre diabetes:          5.7%-6.4% ? ?Diabetes:              >6.4% ? ?Glycemic control for   <7.0% ?adults with diabetes ?  ? ? ?CBG: ?Recent Labs  ?Lab 05/20/21 ?1600 05/20/21 ?1918  05/20/21 ?2322 05/21/21 ?3419 05/21/21 ?3790  ?GLUCAP 131* 149* 162* 172* 179*  ? ?Critical care time:  ?  ? ?Total critical care time: 39 minutes ?  ?Critical care time was exclusive of separately billable procedures and treating other patients. ?  ?Critical care was necessary to treat or prevent imminent or life-threatening deterioration. ?  ?Critical care was time spent personally by me on the following activities: development of treatment plan with patient and/or surrogate as well as nursing, discussions with consultants, evaluation of patient's response to treatment, examination of patient, obtaining history from patient or surrogate, ordering and performing treatments and interventions, ordering and review of laboratory studies, ordering and review of radiographic studies, pulse oximetry and re-evaluation of patient's condition. ?  ?Marcelo Baldy, MD 05/21/21 9:12 AM   ?[facility] ?See Amion for pager ?After 7pm, Please call E-link [phone] ? ? ?  ?

## 2021-05-22 LAB — CBC
HCT: 55 % — ABNORMAL HIGH (ref 39.0–52.0)
Hemoglobin: 17.1 g/dL — ABNORMAL HIGH (ref 13.0–17.0)
MCH: 28.6 pg (ref 26.0–34.0)
MCHC: 31.1 g/dL (ref 30.0–36.0)
MCV: 92.1 fL (ref 80.0–100.0)
Platelets: 169 10*3/uL (ref 150–400)
RBC: 5.97 MIL/uL — ABNORMAL HIGH (ref 4.22–5.81)
RDW: 16.7 % — ABNORMAL HIGH (ref 11.5–15.5)
WBC: 11.2 10*3/uL — ABNORMAL HIGH (ref 4.0–10.5)
nRBC: 0.3 % — ABNORMAL HIGH (ref 0.0–0.2)

## 2021-05-22 LAB — GLUCOSE, CAPILLARY
Glucose-Capillary: 103 mg/dL — ABNORMAL HIGH (ref 70–99)
Glucose-Capillary: 129 mg/dL — ABNORMAL HIGH (ref 70–99)
Glucose-Capillary: 130 mg/dL — ABNORMAL HIGH (ref 70–99)
Glucose-Capillary: 138 mg/dL — ABNORMAL HIGH (ref 70–99)
Glucose-Capillary: 164 mg/dL — ABNORMAL HIGH (ref 70–99)
Glucose-Capillary: 167 mg/dL — ABNORMAL HIGH (ref 70–99)

## 2021-05-22 LAB — HEPARIN LEVEL (UNFRACTIONATED): Heparin Unfractionated: 0.92 IU/mL — ABNORMAL HIGH (ref 0.30–0.70)

## 2021-05-22 LAB — BASIC METABOLIC PANEL
Anion gap: 7 (ref 5–15)
BUN: 29 mg/dL — ABNORMAL HIGH (ref 6–20)
CO2: 29 mmol/L (ref 22–32)
Calcium: 8.6 mg/dL — ABNORMAL LOW (ref 8.9–10.3)
Chloride: 99 mmol/L (ref 98–111)
Creatinine, Ser: 1.52 mg/dL — ABNORMAL HIGH (ref 0.61–1.24)
GFR, Estimated: 57 mL/min — ABNORMAL LOW (ref >60)
Glucose, Bld: 150 mg/dL — ABNORMAL HIGH (ref 70–99)
Potassium: 3.9 mmol/L (ref 3.5–5.1)
Sodium: 135 mmol/L (ref 135–145)

## 2021-05-22 LAB — MAGNESIUM: Magnesium: 2.5 mg/dL — ABNORMAL HIGH (ref 1.7–2.4)

## 2021-05-22 LAB — APTT
aPTT: 60 seconds — ABNORMAL HIGH (ref 24–36)
aPTT: 67 seconds — ABNORMAL HIGH (ref 24–36)
aPTT: 61 seconds — ABNORMAL HIGH (ref 24–36)

## 2021-05-22 LAB — PHOSPHORUS: Phosphorus: 4.4 mg/dL (ref 2.5–4.6)

## 2021-05-22 MED ORDER — FUROSEMIDE 10 MG/ML IJ SOLN
40.0000 mg | Freq: Once | INTRAMUSCULAR | Status: AC
  Administered 2021-05-22: 40 mg via INTRAVENOUS
  Filled 2021-05-22: qty 4

## 2021-05-22 MED ORDER — PANTOPRAZOLE 2 MG/ML SUSPENSION
40.0000 mg | Freq: Every day | ORAL | Status: DC
  Administered 2021-05-22: 40 mg
  Filled 2021-05-22 (×2): qty 20

## 2021-05-22 MED ORDER — DEXMEDETOMIDINE HCL IN NACL 400 MCG/100ML IV SOLN
0.1000 ug/kg/h | INTRAVENOUS | Status: DC
  Administered 2021-05-22: 0.3 ug/kg/h via INTRAVENOUS
  Administered 2021-05-22: 0.4 ug/kg/h via INTRAVENOUS
  Administered 2021-05-23: 0.3 ug/kg/h via INTRAVENOUS
  Administered 2021-05-23: 0.4 ug/kg/h via INTRAVENOUS
  Filled 2021-05-22 (×4): qty 100

## 2021-05-22 MED ORDER — ALBUTEROL SULFATE (2.5 MG/3ML) 0.083% IN NEBU
2.5000 mg | INHALATION_SOLUTION | Freq: Two times a day (BID) | RESPIRATORY_TRACT | Status: DC
  Administered 2021-05-22 – 2021-05-26 (×9): 2.5 mg via RESPIRATORY_TRACT
  Filled 2021-05-22 (×11): qty 3

## 2021-05-22 MED ORDER — HEPARIN BOLUS VIA INFUSION
1400.0000 [IU] | Freq: Once | INTRAVENOUS | Status: AC
  Administered 2021-05-22: 1400 [IU] via INTRAVENOUS
  Filled 2021-05-22: qty 1400

## 2021-05-22 NOTE — Progress Notes (Signed)
? ?NAME:  Gregory Mckenzie, MRN:  [id number], DOB:  09/11/1975, LOS: 8 ?ADMISSION DATE:  05/14/2021, CONSULTATION DATE:  05/19/21 ?REFERRING MD:  Chipper Herb, CHIEF COMPLAINT:  decreased responsiveness  ? ?History of Present Illness:  ?46 y.o. male  with history of chronic systolic and diastolic heart failure, HTN, atrial flutter on xarelto and BB, DM2, severe obesity and suspected OSA/possible OHS, ex 20 py smoker who is admitted for acute on chronic hypercapnic respiratory failure in setting of acute on chronic CHF. He has been aggressively diuresed over the course of the hospitalization since he was admitted on 3/21 (net negative 10L charted) on lasix gtt. ?  ?The evening of his transfer, he was less responsive, ABG was checked 7.25/117/75. He was started on BiPAP 16/6 rate of 14. His repeat ABG showed no change and his IPAP was increased to 18 and PCCM was called. When I assessed him he was arousable with a shake and his tidal volumes were 200-300. I increased his EPAP to 12 and IPAP to 20, held the mask tight to his face and his tidal volumes remained 200-300.  ? ?Pertinent  Medical History  ?Chronic systolic and diastolic heart failure ?HTN ?Atrial flutter ?DM2 ?Severe obesity ?Suspected OSA/possible OHS ? ?Significant Hospital Events: ?Including procedures, antibiotic start and stop dates in addition to other pertinent events   ?05/14/21 admitted for acute on chronic systolic and diastolic heart failure, diuresing ?05/19/21: transferred to ICU for close monitoring while on NIV trial. Intubated.  ?05/20/21: diuresed well on Lasix gtt and Diamox ?05/21/21: therapeutic phlebotomy performed; remains on vent ? ?Interim History / Subjective:  ?Remains intubated and sedated. Therapeutic phlebotomy performed with removal of 500 cc blood on 4/7 for polycythemia. H&H looks better this AM. Vent settings are stable. Secretions increased yesterday and there is clear evidence for sinusitis and mucus plugging; started on empiric antibiotics.  Failed SBT this AM due to low tidal volumes. Working to minimize sedation. ? ?Objective   ?Blood pressure 93/64, pulse 83, temperature 99.1 ?F (37.3 ?C), resp. rate 18, height 5\' 6"  (1.676 m), weight 129.2 kg, SpO2 94 %. ?   ?Vent Mode: PRVC ?FiO2 (%):  [40 %-70 %] 40 % ?Set Rate:  [18 bmp] 18 bmp ?Vt Set:  [500 mL] 500 mL ?PEEP:  [8 cmH20] 8 cmH20 ?Plateau Pressure:  [26 cmH20] 26 cmH20  ? ?Intake/Output Summary (Last 24 hours) at 05/22/2021 0934 ?Last data filed at 05/22/2021 0800 ?Gross per 24 hour  ?Intake 2593.37 ml  ?Output 2105 ml  ?Net 488.37 ml  ? ?Filed Weights  ? 05/19/21 0357 05/20/21 0500 05/22/21 0500  ?Weight: (!) 139.3 kg (!) 137.9 kg 129.2 kg  ? ? ?Examination: ?  ?Physical exam: ?General: Crtitically ill-appearing morbidly obese male, orally intubated ?HEENT: [place]/AT, eyes anicteric.  ETT and OGT in place ?Neuro: intubated and sedated ?Chest: CTA b/l, no wheezes or rhonchi ?Heart: Regular rate and rhythm, no murmurs or gallops ?Abdomen: Soft, nontender, nondistended, bowel sounds present ?Skin: No rash ?Extremities: trace pitting edema of BLE ? ?Resolved Hospital Problem list   ? ?Assessment & Plan:  ?Acute on chronic hypercapnic hypoxic respiratory failure ?Acute respiratory acidosis and metabolic alkalosis ?Acute metabolic encephalopathy due to hypercapnia ?Suspected OHS/OSA ?Likely due to combination of alveolar hypoventilation due to underlying OHS/OSA and volume overload. ?Wean ventilator settings ?Daily SAT/SBT ?VAP bundle, PAD protocol ?PRN bronchodilators ?Step up chest PT ?Target RASS 0, currently on fentanyl and Precedex ?Target even to slightly negative fluid balance today ? ?Complicated sinusitis,  possible HAP ?- Continue empiric antibiotics ?- F/u culture data ?- Chest PT with albuterol BID to facilitate pulmonary toileting ? ?Acute on chronic biventricular systolic and diastolic heart failure ?BNP down to 380 ?PRN Lasix to maintain euvolemia ?Hold coreg, entresto, spironolactone for now ?   ?AKI ?Hold off on aggressive volume removal ?Trend Bmet, avoid nephrotoxins ?Continue diuresing as above ?  ?Paroxysmal atrial flutter, resolved ?Hold coreg as we assess his response to sedation ?Heparin gtt for Afib ?  ?HTN, controlled ?Holding oral antihypertensive for now ?  ?Poorly controlled diabetes type 2 with hyperglycemia ?Sliding scale insulin with CBG goal 140-180 ? ?Morbid obesity ?Tube feeds ?Dietitian consulted ? ?Best Practice (right click and "Reselect all SmartList Selections" daily)  ? ?Diet/type: NPO feeds ?DVT prophylaxis: systemic heparin ?GI prophylaxis: N/A ?Lines: N/A ?Foley:  Yes, and it is still needed ?Code Status:  full code ?Last date of multidisciplinary goals of care discussion 05/22/21 ?Labs   ?CBC: ?Recent Labs  ?Lab 05/19/21 ?5188 05/20/21 ?4166 05/21/21 ?0630 05/21/21 ?1713 05/22/21 ?1601  ?WBC 4.5 4.5 6.9 8.7 11.2*  ?HGB 17.2* 18.2* 19.2* 18.0* 17.1*  ?HCT 59.6* 59.7* 60.7* 57.7* 55.0*  ?MCV 98.2 93.9 90.1 91.2 92.1  ?PLT 217 179 205 203 169  ? ? ?Basic Metabolic Panel: ?Recent Labs  ?Lab 05/18/21 ?0932 05/19/21 ?3557 05/20/21 ?3220 05/21/21 ?2542 05/22/21 ?7062  ?NA 138 141 141 137 135  ?K 4.4 4.7 3.7 3.5 3.9  ?CL 93* 93* 90* 93* 99  ?CO2 36* 41* 40* 32 29  ?GLUCOSE 118* 126* 82 138* 150*  ?BUN 34* 27* 25* 30* 29*  ?CREATININE 1.50* 1.21 1.32* 1.59* 1.52*  ?CALCIUM 8.8* 9.0 9.0 9.1 8.6*  ?MG 2.1 2.0 1.7 2.4 2.5*  ?PHOS  --   --   --  2.9 4.4  ? ?GFR: ?Estimated Creatinine Clearance: 76.5 mL/min (A) (by C-G formula based on SCr of 1.52 mg/dL (H)). ?Recent Labs  ?Lab 05/20/21 ?0609 05/21/21 ?3762 05/21/21 ?1713 05/22/21 ?8315  ?WBC 4.5 6.9 8.7 11.2*  ? ? ?Liver Function Tests: ?Recent Labs  ?Lab 05/17/21 ?0441 05/21/21 ?1761  ?AST  --  20  ?ALT  --  19  ?ALKPHOS  --  91  ?BILITOT  --  1.3*  ?PROT  --  6.9  ?ALBUMIN 3.1* 3.2*  ? ?No results for input(s): LIPASE, AMYLASE in the last 168 hours. ?Recent Labs  ?Lab 05/20/21 ?0609  ?AMMONIA 62*  ? ? ?ABG ?   ?Component Value Date/Time  ?  PHART 7.46 (H) 05/20/2021 0010  ? PCO2ART 67 (HH) 05/20/2021 0010  ? PO2ART 73 (L) 05/20/2021 0010  ? HCO3 47.7 (H) 05/20/2021 0010  ? TCO2 31 05/08/2020 1324  ? TCO2 32 05/08/2020 1324  ? O2SAT 96.7 05/20/2021 0010  ?  ? ?Coagulation Profile: ?Recent Labs  ?Lab 05/21/21 ?0834  ?INR 2.1*  ? ? ?Cardiac Enzymes: ?No results for input(s): CKTOTAL, CKMB, CKMBINDEX, TROPONINI in the last 168 hours. ? ?HbA1C: ?Hgb A1c MFr Bld  ?Date/Time Value Ref Range Status  ?05/14/2021 01:28 PM 6.6 (H) 4.8 - 5.6 % Final  ?  Comment:  ?  (NOTE) ?Pre diabetes:          5.7%-6.4% ? ?Diabetes:              >6.4% ? ?Glycemic control for   <7.0% ?adults with diabetes ?  ?04/07/2020 08:41 PM 6.7 (H) 4.8 - 5.6 % Final  ?  Comment:  ?  (NOTE) ?Pre diabetes:  5.7%-6.4% ? ?Diabetes:              >6.4% ? ?Glycemic control for   <7.0% ?adults with diabetes ?  ? ? ?CBG: ?Recent Labs  ?Lab 05/21/21 ?1605 05/21/21 ?1942 05/21/21 ?2325 05/22/21 ?0350 05/22/21 ?3474  ?GLUCAP 125* 187* 137* 103* 130*  ? ?Critical care time:  ?  ? ?Total critical care time: 38 minutes ?  ?Critical care time was exclusive of separately billable procedures and treating other patients. ?  ?Critical care was necessary to treat or prevent imminent or life-threatening deterioration. ?  ?Critical care was time spent personally by me on the following activities: development of treatment plan with patient and/or surrogate as well as nursing, discussions with consultants, evaluation of patient's response to treatment, examination of patient, obtaining history from patient or surrogate, ordering and performing treatments and interventions, ordering and review of laboratory studies, ordering and review of radiographic studies, pulse oximetry and re-evaluation of patient's condition. ?  ?Marcelo Baldy, MD 05/22/21 9:34 AM   ?[facility] ?See Amion for pager ?After 7pm, Please call E-link [phone] ? ?

## 2021-05-22 NOTE — Consult Note (Signed)
ANTICOAGULATION CONSULT NOTE ? ?Pharmacy Consult for IV Heparin ?Indication: atrial fibrillation ? ?Patient Measurements: ?Height: 5\' 6"  (167.6 cm) ?Weight: 129.2 kg (284 lb 13.4 oz) ?IBW/kg (Calculated) : 63.8 ?Heparin Dosing Weight: 97 kg ? ?Labs: ?Recent Labs  ?  05/20/21 ?0609 05/21/21 ?07/21/21 05/21/21 ?07/21/21 05/21/21 ?1713 05/21/21 ?2308 05/22/21 ?07/22/21 05/22/21 ?1428 05/22/21 ?2139  ?HGB 18.2* 19.2*  --  18.0*  --  17.1*  --   --   ?HCT 59.7* 60.7*  --  57.7*  --  55.0*  --   --   ?PLT 179 205  --  203  --  169  --   --   ?APTT  --   --  36  --    < > 60* 67* 61*  ?LABPROT  --   --  23.1*  --   --   --   --   --   ?INR  --   --  2.1*  --   --   --   --   --   ?HEPARINUNFRC  --   --   --   --   --  0.92*  --   --   ?CREATININE 1.32* 1.59*  --   --   --  1.52*  --   --   ? < > = values in this interval not displayed.  ? ? ? ?Estimated Creatinine Clearance: 76.5 mL/min (A) (by C-G formula based on SCr of 1.52 mg/dL (H)). ? ?Medications:  ?Xarelto 20 mg qSupper (last dose 4/6) ? ?Assessment: ?Patient is a 46 y/o M with medical history including combined systolic and diastolic CHF, HTN, Aflutter on Xarelto, DM, severe obesity, suspected OSA/possible OHS, ex 20 py smoker who is admitted with acute on chronic respiratory failure secondary to acute CHF. Patient initially admitted 3/31 but decompensated 4/5 overnight and ultimately he was intubated. Xarelto has been placed on hold while patient is in the ICU. Pharmacy has been consulted to initiate IV heparin in the meantime for Afib. ? ?Baseline aPTT and PT-INR are pending. Expect baseline anti-Xa level to be elevated secondary to Xarelto therapy. Baseline CBC notable for polycythemia. ?  ? ?Goal of Therapy:  ?Heparin level 0.3-0.7 units/ml ?aPTT 66-102 seconds ?Monitor platelets by anticoagulation protocol: Yes ? ?4/07 2308 aPTT 52, subtherapeutic ?4/08 0623 aPTT 60, HL 0.92 (not correlating), subthera    inc rate from 1600 to 1800 units/hr ?4/08 1428 aPTT 67 ?4/08 2139  aPTT 61, slightly subtherapeutic ?  ?Plan:  ?Will increase heparin infusion to 2100 units/hr ?Recheck an aPTT ~ 6 hours after rate change. ?Daily CBC per protocol while on IV heparin. Daily HL until HL/aPTT correlating ? ?2140, PharmD, MBA ?05/22/2021 ?10:47 PM ? ? ? ? ? ? ?

## 2021-05-22 NOTE — Progress Notes (Signed)
Pharmacy Antibiotic Note ? ?Gregory Mckenzie is a 46 y.o. male admitted on 05/14/2021 with complicated sinusitis.  Pharmacy has been consulted for Zosyn dosing ? ?-MRSA PCR negative. MD wants 1 dose of Vancomycin for now 4/7, f/u sinus/trach cultures ? ?Plan: ?- Zosyn 3.375gm EI IV q8h ? ?f/u cultures, Scr ? ? ?Height: 5\' 6"  (167.6 cm) ?Weight: 129.2 kg (284 lb 13.4 oz) ?IBW/kg (Calculated) : 63.8 ? ?Temp (24hrs), Avg:98.4 ?F (36.9 ?C), Min:97.2 ?F (36.2 ?C), Max:99.5 ?F (37.5 ?C) ? ?Recent Labs  ?Lab 05/18/21 ?07/18/21 05/19/21 ?07/19/21 05/20/21 ?07/20/21 05/21/21 ?07/21/21 05/21/21 ?1713 05/22/21 ?07/22/21  ?WBC 4.4 4.5 4.5 6.9 8.7 11.2*  ?CREATININE 1.50* 1.21 1.32* 1.59*  --  1.52*  ? ?  ?Estimated Creatinine Clearance: 76.5 mL/min (A) (by C-G formula based on SCr of 1.52 mg/dL (H)).   ? ?No Known Allergies ? ?Antimicrobials this admission: ?Zosyn 4/7 >>   ?Vancomycin x 1 4/7  ? ?Dose adjustments this admission: ?  ? ?Microbiology results: ?  BCx:   ?  UCx:    ?4/7 Sputum: from trach:   mod gram variable rod, few GPC ?4/7 sinus drainage cx: ?4/5 MRSA PCR: negative ? ?Thank you for allowing pharmacy to be a part of this patient?s care. ? ?Marquis Down A ?05/22/2021 9:08 AM ? ?

## 2021-05-22 NOTE — Consult Note (Signed)
PHARMACY CONSULT NOTE ? ?Pharmacy Consult for Electrolyte Monitoring and Replacement  ? ?Recent Labs: ?Potassium (mmol/L)  ?Date Value  ?05/22/2021 3.9  ? ?Magnesium (mg/dL)  ?Date Value  ?05/22/2021 2.5 (H)  ? ?Calcium (mg/dL)  ?Date Value  ?05/22/2021 8.6 (L)  ? ?Albumin (g/dL)  ?Date Value  ?05/21/2021 3.2 (L)  ? ?Phosphorus (mg/dL)  ?Date Value  ?05/22/2021 4.4  ? ?Sodium (mmol/L)  ?Date Value  ?05/22/2021 135  ? ?Assessment: ?Patient is a 46 y/o M with medical history including combined systolic and diastolic CHF, HTN, Aflutter on Xarelto, DM, severe obesity, suspected OSA/possible OHS, ex 20 py smoker who is admitted with acute on chronic respiratory failure secondary to acute CHF. Patient initially admitted 3/31 but decompensated 4/5 overnight and ultimately he was intubated. Pharmacy consulted to assist with electrolyte monitoring and replacement as indicated. ? ?Tube feeds started 4/6 ?Lasix gtt at 10 mg/hr + Diamox per tube- both d/ced ? ?Goal of Therapy:  ?Electrolytes within normal limits ? ?Plan:  ?-no replacement at this time ?--Follow-up electrolytes with AM labs tomorrow ? ?Gregory Mckenzie A ?05/22/2021 9:04 AM  ?

## 2021-05-22 NOTE — Consult Note (Signed)
ANTICOAGULATION CONSULT NOTE ? ?Pharmacy Consult for IV Heparin ?Indication: atrial fibrillation ? ?Patient Measurements: ?Height: 5' 5.98" (167.6 cm) ?Weight: 129.2 kg (284 lb 13.4 oz) ?IBW/kg (Calculated) : 63.76 ?Heparin Dosing Weight: 97 kg ? ?Labs: ?Recent Labs  ?  05/20/21 ?0609 05/21/21 ?3810 05/21/21 ?1751 05/21/21 ?1713 05/21/21 ?2308 05/22/21 ?0258  ?HGB 18.2* 19.2*  --  18.0*  --  17.1*  ?HCT 59.7* 60.7*  --  57.7*  --  55.0*  ?PLT 179 205  --  203  --  169  ?APTT  --   --  36  --  52* 60*  ?LABPROT  --   --  23.1*  --   --   --   ?INR  --   --  2.1*  --   --   --   ?HEPARINUNFRC  --   --   --   --   --  0.92*  ?CREATININE 1.32* 1.59*  --   --   --   --   ? ? ? ?Estimated Creatinine Clearance: 73.1 mL/min (A) (by C-G formula based on SCr of 1.59 mg/dL (H)). ? ?Medications:  ?Xarelto 20 mg qSupper (last dose 4/6) ? ?Assessment: ?Patient is a 46 y/o M with medical history including combined systolic and diastolic CHF, HTN, Aflutter on Xarelto, DM, severe obesity, suspected OSA/possible OHS, ex 20 py smoker who is admitted with acute on chronic respiratory failure secondary to acute CHF. Patient initially admitted 3/31 but decompensated 4/5 overnight and ultimately he was intubated. Xarelto has been placed on hold while patient is in the ICU. Pharmacy has been consulted to initiate IV heparin in the meantime for Afib. ? ?Baseline aPTT and PT-INR are pending. Expect baseline anti-Xa level to be elevated secondary to Xarelto therapy. Baseline CBC notable for polycythemia. ?  ? ?Goal of Therapy:  ?Heparin level 0.3-0.7 units/ml ?aPTT 66-102 seconds ?Monitor platelets by anticoagulation protocol: Yes ? ?4/07 2308 aPTT 52, subtherapeutic ?4/08 0623 aPTT 60, HL 0.92 (not correlating), subthera    inc rate from 1600 to 1800 units/hr ? ?  ?Plan:  ?4/08 0623 aPTT 60, HL 0.92 (not correlating), subtherapeutic     ?--Bolus 1400 units x 1 ?--Increase heparin infusion to 1800 units/hr ?--aPTT ~ 6 hours after rate  change ?--Daily CBC per protocol while on IV heparin. Daily HL until HL/aPTT correlating ? ?Bari Mantis PharmD ?Clinical Pharmacist ?05/22/2021 ? ? ? ? ?

## 2021-05-22 NOTE — Consult Note (Signed)
ANTICOAGULATION CONSULT NOTE ? ?Pharmacy Consult for IV Heparin ?Indication: atrial fibrillation ? ?Patient Measurements: ?Height: 5\' 6"  (167.6 cm) ?Weight: 129.2 kg (284 lb 13.4 oz) ?IBW/kg (Calculated) : 63.8 ?Heparin Dosing Weight: 97 kg ? ?Labs: ?Recent Labs  ?  05/20/21 ?0609 05/21/21 ?07/21/21 05/21/21 ?07/21/21 05/21/21 ?07/21/21 05/21/21 ?1713 05/21/21 ?2308 05/22/21 ?07/22/21 05/22/21 ?1428  ?HGB 18.2* 19.2*  --   --  18.0*  --  17.1*  --   ?HCT 59.7* 60.7*  --   --  57.7*  --  55.0*  --   ?PLT 179 205  --   --  203  --  169  --   ?APTT  --   --    < > 36  --  52* 60* 67*  ?LABPROT  --   --   --  23.1*  --   --   --   --   ?INR  --   --   --  2.1*  --   --   --   --   ?HEPARINUNFRC  --   --   --   --   --   --  0.92*  --   ?CREATININE 1.32* 1.59*  --   --   --   --  1.52*  --   ? < > = values in this interval not displayed.  ? ? ? ?Estimated Creatinine Clearance: 76.5 mL/min (A) (by C-G formula based on SCr of 1.52 mg/dL (H)). ? ?Medications:  ?Xarelto 20 mg qSupper (last dose 4/6) ? ?Assessment: ?Patient is a 46 y/o M with medical history including combined systolic and diastolic CHF, HTN, Aflutter on Xarelto, DM, severe obesity, suspected OSA/possible OHS, ex 20 py smoker who is admitted with acute on chronic respiratory failure secondary to acute CHF. Patient initially admitted 3/31 but decompensated 4/5 overnight and ultimately he was intubated. Xarelto has been placed on hold while patient is in the ICU. Pharmacy has been consulted to initiate IV heparin in the meantime for Afib. ? ?Baseline aPTT and PT-INR are pending. Expect baseline anti-Xa level to be elevated secondary to Xarelto therapy. Baseline CBC notable for polycythemia. ?  ? ?Goal of Therapy:  ?Heparin level 0.3-0.7 units/ml ?aPTT 66-102 seconds ?Monitor platelets by anticoagulation protocol: Yes ? ?4/07 2308 aPTT 52, subtherapeutic ?4/08 0623 aPTT 60, HL 0.92 (not correlating), subthera    inc rate from 1600 to 1800 units/hr ?4/08 1428 aPTT 67,  ?  ?Plan:   ?4/08 1428 aPTT 67 at LLN therapeutic range. ?Will increase heparin infusion to 1900 units/hr ?Recheck an aPTT ~ 6 hours after rate change. ?Daily CBC per protocol while on IV heparin. Daily HL until HL/aPTT correlating ? ?6/08, PharmD, [facility] ?Clinical Pharmacist ?05/22/2021 3:25 PM ? ? ? ? ? ?

## 2021-05-23 LAB — GLUCOSE, CAPILLARY
Glucose-Capillary: 140 mg/dL — ABNORMAL HIGH (ref 70–99)
Glucose-Capillary: 149 mg/dL — ABNORMAL HIGH (ref 70–99)
Glucose-Capillary: 137 mg/dL — ABNORMAL HIGH (ref 70–99)
Glucose-Capillary: 125 mg/dL — ABNORMAL HIGH (ref 70–99)
Glucose-Capillary: 122 mg/dL — ABNORMAL HIGH (ref 70–99)
Glucose-Capillary: 112 mg/dL — ABNORMAL HIGH (ref 70–99)

## 2021-05-23 LAB — HEPARIN LEVEL (UNFRACTIONATED)
Heparin Unfractionated: 0.46 IU/mL (ref 0.30–0.70)
Heparin Unfractionated: 0.5 IU/mL (ref 0.30–0.70)

## 2021-05-23 LAB — CBC
HCT: 54.8 % — ABNORMAL HIGH (ref 39.0–52.0)
Hemoglobin: 16.7 g/dL (ref 13.0–17.0)
MCH: 28.1 pg (ref 26.0–34.0)
MCHC: 30.5 g/dL (ref 30.0–36.0)
MCV: 92.1 fL (ref 80.0–100.0)
Platelets: 188 10*3/uL (ref 150–400)
RBC: 5.95 MIL/uL — ABNORMAL HIGH (ref 4.22–5.81)
RDW: 16.8 % — ABNORMAL HIGH (ref 11.5–15.5)
WBC: 8.8 10*3/uL (ref 4.0–10.5)
nRBC: 0.2 % (ref 0.0–0.2)

## 2021-05-23 LAB — CULTURE, RESPIRATORY W GRAM STAIN

## 2021-05-23 LAB — BASIC METABOLIC PANEL
Anion gap: 7 (ref 5–15)
BUN: 30 mg/dL — ABNORMAL HIGH (ref 6–20)
CO2: 28 mmol/L (ref 22–32)
Calcium: 8.8 mg/dL — ABNORMAL LOW (ref 8.9–10.3)
Chloride: 103 mmol/L (ref 98–111)
Creatinine, Ser: 1.5 mg/dL — ABNORMAL HIGH (ref 0.61–1.24)
GFR, Estimated: 57 mL/min — ABNORMAL LOW (ref >60)
Glucose, Bld: 137 mg/dL — ABNORMAL HIGH (ref 70–99)
Potassium: 4.4 mmol/L (ref 3.5–5.1)
Sodium: 138 mmol/L (ref 135–145)

## 2021-05-23 LAB — APTT: aPTT: 73 seconds — ABNORMAL HIGH (ref 24–36)

## 2021-05-23 LAB — PHOSPHORUS: Phosphorus: 4.3 mg/dL (ref 2.5–4.6)

## 2021-05-23 LAB — TRIGLYCERIDES: Triglycerides: 45 mg/dL (ref <150)

## 2021-05-23 LAB — MAGNESIUM: Magnesium: 2.8 mg/dL — ABNORMAL HIGH (ref 1.7–2.4)

## 2021-05-23 MED ORDER — ALBUTEROL SULFATE (2.5 MG/3ML) 0.083% IN NEBU
2.5000 mg | INHALATION_SOLUTION | RESPIRATORY_TRACT | Status: DC | PRN
  Administered 2021-05-23: 2.5 mg via RESPIRATORY_TRACT

## 2021-05-23 MED ORDER — SODIUM CHLORIDE 0.9 % IV SOLN
2.0000 g | INTRAVENOUS | Status: AC
  Administered 2021-05-23 – 2021-05-26 (×4): 2 g via INTRAVENOUS
  Filled 2021-05-23 (×4): qty 2

## 2021-05-23 MED ORDER — ACETAMINOPHEN 650 MG RE SUPP: 650.0000 mg | Freq: Four times a day (QID) | RECTAL | Status: DC | PRN

## 2021-05-23 MED ORDER — ATORVASTATIN CALCIUM 20 MG PO TABS
40.0000 mg | ORAL_TABLET | Freq: Every day | ORAL | Status: DC
  Administered 2021-05-25 – 2021-05-27 (×3): 40 mg via ORAL
  Filled 2021-05-23 (×3): qty 2

## 2021-05-23 MED ORDER — ACETAMINOPHEN 325 MG PO TABS
650.0000 mg | ORAL_TABLET | Freq: Four times a day (QID) | ORAL | Status: DC | PRN
Start: 2021-05-23 — End: 2021-05-28

## 2021-05-23 NOTE — Consult Note (Signed)
ANTICOAGULATION CONSULT NOTE ? ?Pharmacy Consult for IV Heparin ?Indication: atrial fibrillation ? ?Patient Measurements: ?Height: 5\' 6"  (167.6 cm) ?Weight: 129.3 kg (285 lb 0.9 oz) ?IBW/kg (Calculated) : 63.8 ?Heparin Dosing Weight: 97 kg ? ?Labs: ?Recent Labs  ?  05/21/21 ?07/21/21 05/21/21 ?07/21/21 05/21/21 ?1713 05/21/21 ?2308 05/22/21 ?07/22/21 05/22/21 ?1428 05/22/21 ?2139 05/23/21 ?0606  ?HGB 19.2*  --  18.0*  --  17.1*  --   --  16.7  ?HCT 60.7*  --  57.7*  --  55.0*  --   --  54.8*  ?PLT 205  --  203  --  169  --   --  188  ?APTT  --  36  --    < > 60* 67* 61* 73*  ?LABPROT  --  23.1*  --   --   --   --   --   --   ?INR  --  2.1*  --   --   --   --   --   --   ?HEPARINUNFRC  --   --   --   --  0.92*  --   --  0.46  ?CREATININE 1.59*  --   --   --  1.52*  --   --  1.50*  ? < > = values in this interval not displayed.  ? ? ? ?Estimated Creatinine Clearance: 77.5 mL/min (A) (by C-G formula based on SCr of 1.5 mg/dL (H)). ? ?Medications:  ?Xarelto 20 mg qSupper (last dose 4/6) ? ?Assessment: ?Patient is a 46 y/o M with medical history including combined systolic and diastolic CHF, HTN, Aflutter on Xarelto, DM, severe obesity, suspected OSA/possible OHS, ex 20 py smoker who is admitted with acute on chronic respiratory failure secondary to acute CHF. Patient initially admitted 3/31 but decompensated 4/5 overnight and ultimately he was intubated. Xarelto has been placed on hold while patient is in the ICU. Pharmacy has been consulted to initiate IV heparin in the meantime for Afib. ? ?Baseline aPTT and PT-INR are pending. Expect baseline anti-Xa level to be elevated secondary to Xarelto therapy. Baseline CBC notable for polycythemia. ?  ? ?Goal of Therapy:  ?Heparin level 0.3-0.7 units/ml ?aPTT 66-102 seconds ?Monitor platelets by anticoagulation protocol: Yes ? ?4/07 2308 aPTT 52, subtherapeutic ?4/08 0623 aPTT 60, HL 0.92 (not correlating), subthera    inc rate from 1600 to 1800 units/hr ?4/08 1428 aPTT 67 ?4/08 2139 aPTT  61, slightly subtherapeutic ?4/09 0606 aPTT 73, HL 0.46   therapeutic and correlating ?  ?Plan:  ?Will continue heparin infusion at 2100 units/hr ?F/u confirmatory HL in 6 hrs ?Daily CBC per protocol while on IV heparin.  ? ?6/09 PharmD ?Clinical Pharmacist ?05/23/2021 ? ? ? ? ? ? ?

## 2021-05-23 NOTE — Consult Note (Signed)
PHARMACY CONSULT NOTE ? ?Pharmacy Consult for Electrolyte Monitoring and Replacement  ? ?Recent Labs: ?Potassium (mmol/L)  ?Date Value  ?05/23/2021 4.4  ? ?Magnesium (mg/dL)  ?Date Value  ?05/23/2021 2.8 (H)  ? ?Calcium (mg/dL)  ?Date Value  ?05/23/2021 8.8 (L)  ? ?Albumin (g/dL)  ?Date Value  ?05/21/2021 3.2 (L)  ? ?Phosphorus (mg/dL)  ?Date Value  ?05/23/2021 4.3  ? ?Sodium (mmol/L)  ?Date Value  ?05/23/2021 138  ? ?Assessment: ?Patient is a 46 y/o M with medical history including combined systolic and diastolic CHF, HTN, Aflutter on Xarelto, DM, severe obesity, suspected OSA/possible OHS, ex 20 py smoker who is admitted with acute on chronic respiratory failure secondary to acute CHF. Patient initially admitted 3/31 but decompensated 4/5 overnight and ultimately he was intubated. Pharmacy consulted to assist with electrolyte monitoring and replacement as indicated. ? ?Tube feeds started 4/6 ? ? ?Goal of Therapy:  ?Electrolytes within normal limits ? ?Plan:  ?-no replacement at this time ?--Follow-up electrolytes with AM labs tomorrow ? ?Shone Leventhal A ?05/23/2021 8:13 AM  ?

## 2021-05-23 NOTE — Progress Notes (Signed)
Extubation orders written. Cuff leak noted.  Patient extubated and placed on BIPAP. Patient tolerating well. Will continue to monitor. ?

## 2021-05-23 NOTE — Progress Notes (Signed)
Pharmacy Antibiotic Note ? ?Gregory Mckenzie is a 46 y.o. male admitted on 05/14/2021 with complicated sinusitis.  Pharmacy has been consulted for Zosyn dosing ? ?-MRSA PCR negative. MD wants 1 dose of Vancomycin for now 4/7, f/u sinus drainage/trach cultures ? ?Plan: ?- Zosyn 3.375gm EI IV q8h ? ?f/u cultures, Scr ? ? ?Height: 5\' 6"  (167.6 cm) ?Weight: 129.3 kg (285 lb 0.9 oz) ?IBW/kg (Calculated) : 63.8 ? ?Temp (24hrs), Avg:99.3 ?F (37.4 ?C), Min:97.3 ?F (36.3 ?C), Max:100.4 ?F (38 ?C) ? ?Recent Labs  ?Lab 05/19/21 ?07/19/21 05/20/21 ?07/20/21 05/21/21 ?07/21/21 05/21/21 ?1713 05/22/21 ?07/22/21 05/23/21 ?07/23/21  ?WBC 4.5 4.5 6.9 8.7 11.2* 8.8  ?CREATININE 1.21 1.32* 1.59*  --  1.52* 1.50*  ? ?  ?Estimated Creatinine Clearance: 77.5 mL/min (A) (by C-G formula based on SCr of 1.5 mg/dL (H)).   ? ?No Known Allergies ? ?Antimicrobials this admission: ?Zosyn 4/7 (evening)>>   ?Vancomycin x 1 4/7  ? ?Dose adjustments this admission: ?  ? ?Microbiology results: ?  BCx:   ?  UCx:    ?4/7 Sputum: from trach:   mod gram variable rod, few GPC ?4/7 sinus drainage cx: too young to read ?4/5 MRSA PCR: negative ? ?Thank you for allowing pharmacy to be a part of this patient?s care. ? ?Bryah Ocheltree A ?05/23/2021 8:14 AM ? ?

## 2021-05-23 NOTE — Plan of Care (Signed)
?  Problem: Education: ?Goal: Ability to demonstrate management of disease process will improve ?Outcome: Progressing ?Goal: Ability to verbalize understanding of medication therapies will improve ?Outcome: Progressing ?Goal: Individualized Educational Video(s) ?Outcome: Progressing ?  ?Problem: Activity: ?Goal: Capacity to carry out activities will improve ?Outcome: Progressing ?  ?Problem: Cardiac: ?Goal: Ability to achieve and maintain adequate cardiopulmonary perfusion will improve ?Outcome: Progressing ?  ?Problem: Education: ?Goal: Knowledge of General Education information will improve ?Description: Including pain rating scale, medication(s)/side effects and non-pharmacologic comfort measures ?Outcome: Progressing ?  ?Problem: Health Behavior/Discharge Planning: ?Goal: Ability to manage health-related needs will improve ?Outcome: Progressing ?  ?Problem: Clinical Measurements: ?Goal: Respiratory complications will improve ?Outcome: Progressing ?Goal: Cardiovascular complication will be avoided ?Outcome: Progressing ?  ?Problem: Activity: ?Goal: Risk for activity intolerance will decrease ?Outcome: Progressing ?  ?Problem: Nutrition: ?Goal: Adequate nutrition will be maintained ?Outcome: Progressing ?  ?Problem: Safety: ?Goal: Ability to remain free from injury will improve ?Outcome: Progressing ?  ?

## 2021-05-23 NOTE — Consult Note (Signed)
ANTICOAGULATION CONSULT NOTE ? ?Pharmacy Consult for IV Heparin ?Indication: atrial fibrillation ? ?Patient Measurements: ?Height: 5\' 6"  (167.6 cm) ?Weight: 129.3 kg (285 lb 0.9 oz) ?IBW/kg (Calculated) : 63.8 ?Heparin Dosing Weight: 97 kg ? ?Labs: ?Recent Labs  ?  05/21/21 ?07/21/21 05/21/21 ?07/21/21 05/21/21 ?1713 05/21/21 ?2308 05/22/21 ?07/22/21 05/22/21 ?1428 05/22/21 ?2139 05/23/21 ?0606 05/23/21 ?1211  ?HGB 19.2*  --  18.0*  --  17.1*  --   --  16.7  --   ?HCT 60.7*  --  57.7*  --  55.0*  --   --  54.8*  --   ?PLT 205  --  203  --  169  --   --  188  --   ?APTT  --  36  --    < > 60* 67* 61* 73*  --   ?LABPROT  --  23.1*  --   --   --   --   --   --   --   ?INR  --  2.1*  --   --   --   --   --   --   --   ?HEPARINUNFRC  --   --   --   --  0.92*  --   --  0.46 0.50  ?CREATININE 1.59*  --   --   --  1.52*  --   --  1.50*  --   ? < > = values in this interval not displayed.  ? ? ? ?Estimated Creatinine Clearance: 77.5 mL/min (A) (by C-G formula based on SCr of 1.5 mg/dL (H)). ? ?Medications:  ?Xarelto 20 mg qSupper (last dose 4/6) ? ?Assessment: ?Patient is a 46 y/o M with medical history including combined systolic and diastolic CHF, HTN, Aflutter on Xarelto, DM, severe obesity, suspected OSA/possible OHS, ex 20 py smoker who is admitted with acute on chronic respiratory failure secondary to acute CHF. Patient initially admitted 3/31 but decompensated 4/5 overnight and ultimately he was intubated. Xarelto has been placed on hold while patient is in the ICU. Pharmacy has been consulted to initiate IV heparin in the meantime for Afib. ? ?  ? ?Goal of Therapy:  ?Heparin level 0.3-0.7 units/ml ?Monitor platelets by anticoagulation protocol: Yes ? ?4/07 2308 aPTT 52, subtherapeutic ?4/08 0623 aPTT 60, HL 0.92 (not correlating), subthera    inc rate from 1600 to 1800 units/hr ?4/08 1428 aPTT 67 ?4/08 2139 aPTT 61, slightly subtherapeutic ?4/09 0606 aPTT 73, HL 0.46   therapeutic and correlating ?4/09 1211 HL 0.5  ?  ?Plan:   ?Heparin level is therapeutic. Will continue heparin at 2100 units/hr. Recheck heparin level and CBC with AM labs.  ? ?6/09, PharmD ?Clinical Pharmacist ?05/23/2021 ? ? ? ? ? ? ?

## 2021-05-23 NOTE — Progress Notes (Signed)
? ?NAME:  Gregory Mckenzie, MRN:  [id number], DOB:  06-17-1975, LOS: 9 ?ADMISSION DATE:  05/14/2021, CONSULTATION DATE:  05/19/21 ?REFERRING MD:  Roosevelt [place]ocks, CHIEF COMPLAINT:  decreased responsiveness  ? ?History of Present Illness:  ?46 y.o. male  with history of chronic systolic and diastolic heart failure, HTN, atrial flutter on xarelto and BB, DM2, severe obesity and suspected OSA/possible OHS, ex 20 py smoker who is admitted for acute on chronic hypercapnic respiratory failure in setting of acute on chronic CHF. He has been aggressively diuresed over the course of the hospitalization since he was admitted on 3/21 (net negative 10L charted) on lasix gtt. ?  ?The evening of his transfer, he was less responsive, ABG was checked 7.25/117/75. He was started on BiPAP 16/6 rate of 14. His repeat ABG showed no change and his IPAP was increased to 18 and PCCM was called. When I assessed him he was arousable with a shake and his tidal volumes were 200-300. I increased his EPAP to 12 and IPAP to 20, held the mask tight to his face and his tidal volumes remained 200-300.  ? ?Pertinent  Medical History  ?Chronic systolic and diastolic heart failure ?HTN ?Atrial flutter ?DM2 ?Severe obesity ?Suspected OSA/possible OHS ? ?Significant Hospital Events: ?Including procedures, antibiotic start and stop dates in addition to other pertinent events   ?05/14/21 admitted for acute on chronic systolic and diastolic heart failure, diuresing ?05/19/21: transferred to ICU for close monitoring while on NIV trial. Intubated.  ?05/20/21: diuresed well on Lasix gtt and Diamox ?05/21/21: therapeutic phlebotomy performed; remains on vent ?05/22/21: remains on mechanical ventilation ? ?Interim History / Subjective:  ?Remains intubated. Much more alert today. He is communicating by writing. Failing SBT due to low tidal volumes on PS at 5/8. Secretions are diminished. He remains on Zosyn while awaiting culture data from sinuses and tracheal aspirate. Met goal of  euvolemia for last 24 hours. ? ?Objective   ?Blood pressure 108/79, pulse 76, temperature 98.1 ?F (36.7 ?C), temperature source Esophageal, resp. rate (!) 25, height 5' 6" (1.676 m), weight 129.3 kg, SpO2 95 %. ?   ?Vent Mode: PRVC ?FiO2 (%):  [40 %] 40 % ?Set Rate:  [18 bmp] 18 bmp ?Vt Set:  [500 mL] 500 mL ?PEEP:  [8 cmH20] 8 cmH20 ?Plateau Pressure:  [24 cmH20] 24 cmH20  ? ?Intake/Output Summary (Last 24 hours) at 05/23/2021 0946 ?Last data filed at 05/23/2021 0800 ?Gross per 24 hour  ?Intake 2643.11 ml  ?Output 2525 ml  ?Net 118.11 ml  ? ?Filed Weights  ? 05/20/21 0500 05/22/21 0500 05/23/21 0409  ?Weight: (!) 137.9 kg 129.2 kg 129.3 kg  ? ? ?Examination: ?  ?Physical exam: ?General: ill-appearing morbidly obese male, orally intubated ?HEENT: /AT, eyes anicteric.  ETT and OGT in place ?Neuro: intubated and sedated ?Chest: CTA b/l, no wheezes or rhonchi ?Heart: Regular rate and rhythm, no murmurs or gallops ?Abdomen: Soft, nontender, nondistended, bowel sounds present ?Skin: No rash ?Extremities: trace pitting edema of BLE ? ?Resolved Hospital Problem list   ?Paroxysmal atrial flutter ? ?Assessment & Plan:  ?Acute on chronic hypercapnic hypoxic respiratory failure ?Acute respiratory acidosis and metabolic alkalosis ?Acute metabolic encephalopathy due to hypercapnia ?Suspected OHS/OSA ?Likely due to combination of alveolar hypoventilation due to underlying OHS/OSA and volume overload. ?Wean ventilator settings ?Daily SAT/SBT ?VAP bundle, PAD protocol ?PRN bronchodilators ?Continue chest PT ?Target RASS 0, minimize sedation ?Target even to slightly negative fluid balance today ? ?Complicated sinusitis, possible HAP ?- Continue empiric  antibiotics ?- F/u culture data ?- Chest PT with albuterol BID to facilitate pulmonary toileting ? ?Acute on chronic biventricular systolic and diastolic heart failure ?Paroxysmal atrial flutter, resolved ?BNP down to 380 ?PRN Lasix to maintain euvolemia ?Hold coreg, entresto,  spironolactone for now ?Heparin gtt for Afib; CHADSVASC score is 3 ?  ?AKI ?Hold off on aggressive volume removal ?Trend Bmet, avoid nephrotoxins ?Continue diuresing as above ?  ?HTN ?Holding oral antihypertensives for now ?  ?Diabetes type 2 with hyperglycemia ?Sliding scale insulin with CBG goal 140-180 ? ?Morbid obesity ?Tube feeds ?Dietitian consulted ? ?Best Practice (right click and "Reselect all SmartList Selections" daily)  ? ?Diet/type: NPO Tube Feeds ?DVT prophylaxis: systemic heparin ?GI prophylaxis: N/A ?Lines: N/A ?Foley:  Yes, and it is still needed ?Code Status:  full code ?Last date of multidisciplinary goals of care discussion 05/23/21 ?Labs   ?CBC: ?Recent Labs  ?Lab 05/20/21 ?0609 05/21/21 ?0277 05/21/21 ?1713 05/22/21 ?4128 05/23/21 ?7867  ?WBC 4.5 6.9 8.7 11.2* 8.8  ?HGB 18.2* 19.2* 18.0* 17.1* 16.7  ?HCT 59.7* 60.7* 57.7* 55.0* 54.8*  ?MCV 93.9 90.1 91.2 92.1 92.1  ?PLT 179 205 203 169 188  ? ? ?Basic Metabolic Panel: ?Recent Labs  ?Lab 05/19/21 ?6720 05/20/21 ?9470 05/21/21 ?9628 05/22/21 ?3662 05/23/21 ?0606  ?NA 141 141 137 135 138  ?K 4.7 3.7 3.5 3.9 4.4  ?CL 93* 90* 93* 99 103  ?CO2 41* 40* 32 29 28  ?GLUCOSE 126* 82 138* 150* 137*  ?BUN 27* 25* 30* 29* 30*  ?CREATININE 1.21 1.32* 1.59* 1.52* 1.50*  ?CALCIUM 9.0 9.0 9.1 8.6* 8.8*  ?MG 2.0 1.7 2.4 2.5* 2.8*  ?PHOS  --   --  2.9 4.4 4.3  ? ?GFR: ?Estimated Creatinine Clearance: 77.5 mL/min (A) (by C-G formula based on SCr of 1.5 mg/dL (H)). ?Recent Labs  ?Lab 05/21/21 ?9476 05/21/21 ?1713 05/22/21 ?5465 05/23/21 ?0354  ?WBC 6.9 8.7 11.2* 8.8  ? ? ?Liver Function Tests: ?Recent Labs  ?Lab 05/17/21 ?0441 05/21/21 ?6568  ?AST  --  20  ?ALT  --  19  ?ALKPHOS  --  91  ?BILITOT  --  1.3*  ?PROT  --  6.9  ?ALBUMIN 3.1* 3.2*  ? ?No results for input(s): LIPASE, AMYLASE in the last 168 hours. ?Recent Labs  ?Lab 05/20/21 ?0609  ?AMMONIA 62*  ? ? ?ABG ?   ?Component Value Date/Time  ? PHART 7.46 (H) 05/20/2021 0010  ? PCO2ART 67 (HH) 05/20/2021 0010  ?  PO2ART 73 (L) 05/20/2021 0010  ? HCO3 47.7 (H) 05/20/2021 0010  ? TCO2 31 05/08/2020 1324  ? TCO2 32 05/08/2020 1324  ? O2SAT 96.7 05/20/2021 0010  ?  ? ?Coagulation Profile: ?Recent Labs  ?Lab 05/21/21 ?1275  ?INR 2.1*  ? ? ?Cardiac Enzymes: ?No results for input(s): CKTOTAL, CKMB, CKMBINDEX, TROPONINI in the last 168 hours. ? ?HbA1C: ?Hgb A1c MFr Bld  ?Date/Time Value Ref Range Status  ?05/14/2021 01:28 PM 6.6 (H) 4.8 - 5.6 % Final  ?  Comment:  ?  (NOTE) ?Pre diabetes:          5.7%-6.4% ? ?Diabetes:              >6.4% ? ?Glycemic control for   <7.0% ?adults with diabetes ?  ?04/07/2020 08:41 PM 6.7 (H) 4.8 - 5.6 % Final  ?  Comment:  ?  (NOTE) ?Pre diabetes:          5.7%-6.4% ? ?Diabetes:              >  6.4% ? ?Glycemic control for   <7.0% ?adults with diabetes ?  ? ? ?CBG: ?Recent Labs  ?Lab 05/22/21 ?1520 05/22/21 ?1927 05/22/21 ?2342 05/23/21 ?7622 05/23/21 ?0719  ?GLUCAP 164* 129* 138* 140* 149*  ? ?Critical care time:  ?  ? ?Total critical care time: 36 minutes ?  ?Critical care time was exclusive of separately billable procedures and treating other patients. ?  ?Critical care was necessary to treat or prevent imminent or life-threatening deterioration. ?  ?Critical care was time spent personally by me on the following activities: development of treatment plan with patient and/or surrogate as well as nursing, discussions with consultants, evaluation of patient's response to treatment, examination of patient, obtaining history from patient or surrogate, ordering and performing treatments and interventions, ordering and review of laboratory studies, ordering and review of radiographic studies, pulse oximetry and re-evaluation of patient's condition. ?  ?Bennie Pierini, MD 05/23/21 9:46 AM   ?[facility] ?See Amion for pager ?After 7pm, Please call E-link [phone] ? ?

## 2021-05-24 ENCOUNTER — Encounter: Payer: Self-pay | Admitting: Internal Medicine

## 2021-05-24 ENCOUNTER — Ambulatory Visit: Payer: Self-pay | Admitting: Family

## 2021-05-24 DIAGNOSIS — I4892 Unspecified atrial flutter: Secondary | ICD-10-CM

## 2021-05-24 DIAGNOSIS — J9602 Acute respiratory failure with hypercapnia: Secondary | ICD-10-CM

## 2021-05-24 DIAGNOSIS — I10 Essential (primary) hypertension: Secondary | ICD-10-CM

## 2021-05-24 LAB — CBC
HCT: 57.7 % — ABNORMAL HIGH (ref 39.0–52.0)
Hemoglobin: 17.1 g/dL — ABNORMAL HIGH (ref 13.0–17.0)
MCH: 28.7 pg (ref 26.0–34.0)
MCHC: 29.6 g/dL — ABNORMAL LOW (ref 30.0–36.0)
MCV: 96.8 fL (ref 80.0–100.0)
Platelets: 203 10*3/uL (ref 150–400)
RBC: 5.96 MIL/uL — ABNORMAL HIGH (ref 4.22–5.81)
RDW: 16.6 % — ABNORMAL HIGH (ref 11.5–15.5)
WBC: 9.8 10*3/uL (ref 4.0–10.5)
nRBC: 0 % (ref 0.0–0.2)

## 2021-05-24 LAB — MAGNESIUM: Magnesium: 2.5 mg/dL — ABNORMAL HIGH (ref 1.7–2.4)

## 2021-05-24 LAB — GLUCOSE, CAPILLARY
Glucose-Capillary: 104 mg/dL — ABNORMAL HIGH (ref 70–99)
Glucose-Capillary: 104 mg/dL — ABNORMAL HIGH (ref 70–99)
Glucose-Capillary: 113 mg/dL — ABNORMAL HIGH (ref 70–99)
Glucose-Capillary: 122 mg/dL — ABNORMAL HIGH (ref 70–99)
Glucose-Capillary: 80 mg/dL (ref 70–99)
Glucose-Capillary: 99 mg/dL (ref 70–99)

## 2021-05-24 LAB — BLOOD GAS, ARTERIAL
Acid-Base Excess: 4.9 mmol/L — ABNORMAL HIGH (ref 0.0–2.0)
Bicarbonate: 32.7 mmol/L — ABNORMAL HIGH (ref 20.0–28.0)
O2 Content: 4 L/min
O2 Saturation: 96.5 %
Patient temperature: 37
pCO2 arterial: 62 mmHg — ABNORMAL HIGH (ref 32–48)
pH, Arterial: 7.33 — ABNORMAL LOW (ref 7.35–7.45)
pO2, Arterial: 71 mmHg — ABNORMAL LOW (ref 83–108)

## 2021-05-24 LAB — CULTURE, ROUTINE-SINUS: Culture: NORMAL

## 2021-05-24 LAB — BASIC METABOLIC PANEL
Anion gap: 6 (ref 5–15)
BUN: 22 mg/dL — ABNORMAL HIGH (ref 6–20)
CO2: 32 mmol/L (ref 22–32)
Calcium: 9.3 mg/dL (ref 8.9–10.3)
Chloride: 102 mmol/L (ref 98–111)
Creatinine, Ser: 1.35 mg/dL — ABNORMAL HIGH (ref 0.61–1.24)
GFR, Estimated: >60 mL/min (ref >60)
Glucose, Bld: 98 mg/dL (ref 70–99)
Potassium: 5.4 mmol/L — ABNORMAL HIGH (ref 3.5–5.1)
Sodium: 140 mmol/L (ref 135–145)

## 2021-05-24 LAB — HEPARIN LEVEL (UNFRACTIONATED): Heparin Unfractionated: 0.47 IU/mL (ref 0.30–0.70)

## 2021-05-24 LAB — PHOSPHORUS: Phosphorus: 4.6 mg/dL (ref 2.5–4.6)

## 2021-05-24 MED ORDER — DAPAGLIFLOZIN PROPANEDIOL 10 MG PO TABS
10.0000 mg | ORAL_TABLET | Freq: Every day | ORAL | Status: DC
  Administered 2021-05-25 – 2021-05-28 (×4): 10 mg via ORAL
  Filled 2021-05-24 (×4): qty 1

## 2021-05-24 NOTE — Progress Notes (Signed)
? ?NAME:  Gregory Mckenzie, MRN:  [id number], DOB:  10/29/1975, LOS: 10 ?ADMISSION DATE:  05/14/2021, CONSULTATION DATE:  05/19/21 ?REFERRING MD:  Roosevelt [place], CHIEF COMPLAINT:  decreased responsiveness  ? ?History of Present Illness:  ?46 y.o. male  with history of chronic systolic and diastolic heart failure, HTN, atrial flutter on xarelto and BB, DM2, severe obesity and suspected OSA/possible OHS, ex 20 py smoker who is admitted for acute on chronic hypercapnic respiratory failure in setting of acute on chronic CHF. He has been aggressively diuresed over the course of the hospitalization since he was admitted on 3/21 (net negative 10L charted) on lasix gtt. ?  ?The evening of his transfer, he was less responsive, ABG was checked 7.25/117/75. He was started on BiPAP 16/6 rate of 14. His repeat ABG showed no change and his IPAP was increased to 18 and PCCM was called. When I assessed him he was arousable with a shake and his tidal volumes were 200-300. I increased his EPAP to 12 and IPAP to 20, held the mask tight to his face and his tidal volumes remained 200-300.  ? ?05/24/21- patient is improved , he got up with PT and stood. He is mildly deconditioned. He is off all infusions. He is on and off BIPAP and has thickened phlegm on expectoration.   ? ?Pertinent  Medical History  ?Chronic systolic and diastolic heart failure ?HTN ?Atrial flutter ?DM2 ?Severe obesity ?Suspected OSA/possible OHS ? ?Significant Hospital Events: ?Including procedures, antibiotic start and stop dates in addition to other pertinent events   ?05/14/21 admitted for acute on chronic systolic and diastolic heart failure, diuresing ?05/19/21: transferred to ICU for close monitoring while on NIV trial. Intubated.  ?05/20/21: diuresed well on Lasix gtt and Diamox ?05/21/21: therapeutic phlebotomy performed; remains on vent ?05/22/21: remains on mechanical ventilation ? ?Interim History / Subjective:  ?Remains intubated. Much more alert today. He is communicating by  writing. Failing SBT due to low tidal volumes on PS at 5/8. Secretions are diminished. He remains on Zosyn while awaiting culture data from sinuses and tracheal aspirate. Met goal of euvolemia for last 24 hours. ? ?Objective   ?Blood pressure (!) 147/83, pulse (!) 108, temperature 99.4 ?F (37.4 ?C), temperature source Axillary, resp. rate (!) 27, height $RemoveBe'5\' 6"'AGiXjsyYs$  (1.676 m), weight 124.7 kg, SpO2 98 %. ?   ?Vent Mode: PSV ?FiO2 (%):  [40 %] 40 % ?PEEP:  [8 cmH20] 8 cmH20 ?Pressure Support:  [5 cmH20] 5 cmH20  ? ?Intake/Output Summary (Last 24 hours) at 05/24/2021 0831 ?Last data filed at 05/24/2021 0800 ?Gross per 24 hour  ?Intake 1246.32 ml  ?Output 3180 ml  ?Net -1933.68 ml  ? ? ?Filed Weights  ? 05/22/21 0500 05/23/21 0409 05/24/21 0500  ?Weight: 129.2 kg 129.3 kg 124.7 kg  ? ? ?Examination: ?  ?Physical exam: ?General: ill-appearing morbidly obese male on BIPAP ?HEENT: /AT, eyes anicteric ?Neuro: able to speak but grossly no FND ?Chest: CTA b/l, no wheezes or rhonchi ?Heart: Regular rate and rhythm, no murmurs or gallops ?Abdomen: Soft, nontender, nondistended, bowel sounds present ?Skin: No rash ?Extremities: trace pitting edema of BLE ? ?Resolved Hospital Problem list   ?Paroxysmal atrial flutter ? ?Assessment & Plan:  ?Acute on chronic hypercapnic hypoxic respiratory failure ?Acute respiratory acidosis and metabolic alkalosis ?Acute metabolic encephalopathy due to hypercapnia ?Suspected OHS/OSA ?Likely due to combination of alveolar hypoventilation due to underlying OHS/OSA and volume overload. ?Wean ventilator settings ?Daily SAT/SBT ?VAP bundle, PAD protocol ?PRN bronchodilators ?Continue chest  PT ?Target RASS 0, minimize sedation ?Target even to slightly negative fluid balance today ? ?Complicated sinusitis, possible HAP ?- Continue empiric antibiotics ?- F/u culture data ?- Chest PT with albuterol BID to facilitate pulmonary toileting ? ?Acute on chronic biventricular systolic and diastolic heart  failure ?Paroxysmal atrial flutter, resolved ?BNP down to 380 ?PRN Lasix to maintain euvolemia ?Hold coreg, entresto, spironolactone for now ?Heparin gtt for Afib; CHADSVASC score is 3 ?  ?AKI ?Hold off on aggressive volume removal ?Trend Bmet, avoid nephrotoxins ?Continue diuresing as above ?  ?HTN ?Holding oral antihypertensives for now ?  ?Diabetes type 2 with hyperglycemia ?Sliding scale insulin with CBG goal 140-180 ? ?Morbid obesity ?Tube feeds ?Dietitian consulted ? ?Best Practice (right click and "Reselect all SmartList Selections" daily)  ? ?Diet/type: NPO Tube Feeds ?DVT prophylaxis: systemic heparin ?GI prophylaxis: N/A ?Lines: N/A ?Foley:  Yes, and it is still needed ?Code Status:  full code ?Last date of multidisciplinary goals of care discussion 05/23/21 ?Labs   ?CBC: ?Recent Labs  ?Lab 05/21/21 ?8921 05/21/21 ?1713 05/22/21 ?1941 05/23/21 ?7408 05/24/21 ?1448  ?WBC 6.9 8.7 11.2* 8.8 9.8  ?HGB 19.2* 18.0* 17.1* 16.7 17.1*  ?HCT 60.7* 57.7* 55.0* 54.8* 57.7*  ?MCV 90.1 91.2 92.1 92.1 96.8  ?PLT 205 203 169 188 203  ? ? ? ?Basic Metabolic Panel: ?Recent Labs  ?Lab 05/20/21 ?0609 05/21/21 ?1856 05/22/21 ?3149 05/23/21 ?7026 05/24/21 ?3785  ?NA 141 137 135 138 140  ?K 3.7 3.5 3.9 4.4 5.4*  ?CL 90* 93* 99 103 102  ?CO2 40* 32 29 28 32  ?GLUCOSE 82 138* 150* 137* 98  ?BUN 25* 30* 29* 30* 22*  ?CREATININE 1.32* 1.59* 1.52* 1.50* 1.35*  ?CALCIUM 9.0 9.1 8.6* 8.8* 9.3  ?MG 1.7 2.4 2.5* 2.8* 2.5*  ?PHOS  --  2.9 4.4 4.3 4.6  ? ? ?GFR: ?Estimated Creatinine Clearance: 84.4 mL/min (A) (by C-G formula based on SCr of 1.35 mg/dL (H)). ?Recent Labs  ?Lab 05/21/21 ?1713 05/22/21 ?8850 05/23/21 ?0606 05/24/21 ?2774  ?WBC 8.7 11.2* 8.8 9.8  ? ? ? ?Liver Function Tests: ?Recent Labs  ?Lab 05/21/21 ?1287  ?AST 20  ?ALT 19  ?ALKPHOS 91  ?BILITOT 1.3*  ?PROT 6.9  ?ALBUMIN 3.2*  ? ? ?No results for input(s): LIPASE, AMYLASE in the last 168 hours. ?Recent Labs  ?Lab 05/20/21 ?0609  ?AMMONIA 62*  ? ? ? ?ABG ?   ?Component Value  Date/Time  ? PHART 7.46 (H) 05/20/2021 0010  ? PCO2ART 67 (HH) 05/20/2021 0010  ? PO2ART 73 (L) 05/20/2021 0010  ? HCO3 47.7 (H) 05/20/2021 0010  ? TCO2 31 05/08/2020 1324  ? TCO2 32 05/08/2020 1324  ? O2SAT 96.7 05/20/2021 0010  ? ?  ? ?Coagulation Profile: ?Recent Labs  ?Lab 05/21/21 ?8676  ?INR 2.1*  ? ? ? ?Cardiac Enzymes: ?No results for input(s): CKTOTAL, CKMB, CKMBINDEX, TROPONINI in the last 168 hours. ? ?HbA1C: ?Hgb A1c MFr Bld  ?Date/Time Value Ref Range Status  ?05/14/2021 01:28 PM 6.6 (H) 4.8 - 5.6 % Final  ?  Comment:  ?  (NOTE) ?Pre diabetes:          5.7%-6.4% ? ?Diabetes:              >6.4% ? ?Glycemic control for   <7.0% ?adults with diabetes ?  ?04/07/2020 08:41 PM 6.7 (H) 4.8 - 5.6 % Final  ?  Comment:  ?  (NOTE) ?Pre diabetes:  5.7%-6.4% ? ?Diabetes:              >6.4% ? ?Glycemic control for   <7.0% ?adults with diabetes ?  ? ? ?CBG: ?Recent Labs  ?Lab 05/23/21 ?1524 05/23/21 ?1948 05/23/21 ?2315 05/24/21 ?0354 05/24/21 ?6568  ?GLUCAP 125* 122* 112* 99 80  ? ? ?Critical care provider statement:  ? ?Total critical care time: 33 minutes ?  ?Performed by: Lanney Gins MD ?  ?Critical care time was exclusive of separately billable procedures and treating other patients. ?  ?Critical care was necessary to treat or prevent imminent or life-threatening deterioration. ?  ?Critical care was time spent personally by me on the following activities: development of treatment plan with patient and/or surrogate as well as nursing, discussions with consultants, evaluation of patient's response to treatment, examination of patient, obtaining history from patient or surrogate, ordering and performing treatments and interventions, ordering and review of laboratory studies, ordering and review of radiographic studies, pulse oximetry and re-evaluation of patient's condition. ?  ? ?Ottie Glazier, M.D.  ?Pulmonary & Critical Care Medicine  ? ? ? ? ?

## 2021-05-24 NOTE — Evaluation (Signed)
Occupational Therapy Evaluation ?Patient Details ?Name: Gregory Mckenzie ?MRN: [id number] ?DOB: 07/07/1975 ?Today's Date: 05/24/2021 ? ? ?History of Present Illness Gregory Mckenzie is a 46 y.o. male with medical history significant for chronic systolic heart failure and hypertension who presents to the ER for evaluation of a 1 week history of scrotal swelling associated with shortness of breath and lower extremity swelling.  He has a history of CHF and admits to being noncompliant with prescribed medications. Intubated 05/19/21, extubated 05/23/21.  ? ?Clinical Impression ?  ?Pt was seen for OT evaluation this date. PLOF/home set up limited by pt on BiPAP and difficult to understanding. Pt received seated EOB with PT. Pt completed transfer with MIN A from PT to stand. Set up with a wipe to attempt pericare in standing. Pt able to maintain static standing balance with LUE support on the RW but ultimately limited in trunk rotation and required MAX A for pericare in standing. Took a couple side steps EOB, MIN A for BLE mgt back to bed. Pt endorsed 7/10 perceived rate of exertion with limited activity completed with therapy. Currently pt demonstrates impairments as described below (See OT problem list) which functionally limit his ability to perform ADL/self-care tasks. Pt currently requires MAX A for LB ADL tasks, MIN A for seated UB ADL, and CGA-MIN A for ADL transfers with RW. Pt on BiPAP throughout (cleared for participation by RN). Pt would benefit from skilled OT services to address noted impairments and functional limitations (see below for any additional details) in order to maximize safety and independence while minimizing falls risk and caregiver burden. Upon hospital discharge, recommend STR to maximize pt safety and return to PLOF.   ? ?Recommendations for follow up therapy are one component of a multi-disciplinary discharge planning process, led by the attending physician.  Recommendations may be updated based on  patient status, additional functional criteria and insurance authorization.  ? ?Follow Up Recommendations ? Skilled nursing-short term rehab (<3 hours/day)  ?  ?Assistance Recommended at Discharge Frequent or constant Supervision/Assistance  ?Patient can return home with the following A little help with walking and/or transfers;A lot of help with bathing/dressing/bathroom;Assistance with cooking/housework;Assist for transportation;Help with stairs or ramp for entrance;Direct supervision/assist for medications management ? ?  ?Functional Status Assessment ? Patient has had a recent decline in their functional status and demonstrates the ability to make significant improvements in function in a reasonable and predictable amount of time.  ?Equipment Recommendations ? Other (comment) (2WW)  ?  ?Recommendations for Other Services   ? ? ?  ?Precautions / Restrictions Precautions ?Precautions: Fall ?Restrictions ?Weight Bearing Restrictions: No  ? ?  ? ?Mobility Bed Mobility ?Overal bed mobility: Needs Assistance ?Bed Mobility: Sit to Supine ?  ?  ?  ?Sit to supine: Min assist ?  ?General bed mobility comments: MIN A for BLE mgt back to bed ?  ? ?Transfers ?Overall transfer level: Needs assistance ?Equipment used: 1 person hand held assist, Rolling walker (2 wheels) ?Transfers: Sit to/from Stand ?Sit to Stand: Min assist, Min guard ?  ?  ?  ?  ?  ?General transfer comment: patient able to stand initially with min A holding to bed rail or my hand. Stood again to attempt to clean himself using RW, min guard ?  ? ?  ?Balance Overall balance assessment: Needs assistance ?Sitting-balance support: Feet unsupported, Bilateral upper extremity supported ?Sitting balance-Leahy Scale: Fair ?  ?  ?Standing balance support: Bilateral upper extremity supported, During functional activity,  Reliant on assistive device for balance, Single extremity supported ?Standing balance-Leahy Scale: Fair ?Standing balance comment: able to attempt  pericare in standing with LUE on RW ?  ?  ?  ?  ?  ?  ?  ?  ?  ?  ?  ?   ? ?ADL either performed or assessed with clinical judgement  ? ?ADL Overall ADL's : Needs assistance/impaired ?  ?  ?  ?  ?  ?  ?  ?  ?  ?  ?  ?  ?  ?  ?  ?  ?  ?  ?  ?General ADL Comments: Pt currently requires MOD A for LB ADL tasks, MAX A for pericare in standing (attempts but unable to reach with limited trunk rotation and decr balance in standing), and CGA-MIN A for ADL transfers with RW. PRN MIN A for seated UB ADL tasks.  ? ? ? ?Vision   ?   ?   ?Perception   ?  ?Praxis   ?  ? ?Pertinent Vitals/Pain Pain Assessment ?Pain Assessment: No/denies pain  ? ? ? ?Hand Dominance   ?  ?Extremity/Trunk Assessment Upper Extremity Assessment ?Upper Extremity Assessment: Generalized weakness ?  ?Lower Extremity Assessment ?Lower Extremity Assessment: Generalized weakness ?  ?Cervical / Trunk Assessment ?Cervical / Trunk Assessment: Normal ?  ?Communication Communication ?Communication: Other (comment);No difficulties (difficult to understand currently due to face mask) ?  ?Cognition Arousal/Alertness: Awake/alert ?Behavior During Therapy: [facility] for tasks assessed/performed ?Overall Cognitive Status: Within Functional Limits for tasks assessed ?  ?  ?  ?  ?  ?  ?  ?  ?  ?  ?  ?  ?  ?  ?  ?  ?  ?  ?  ?General Comments    ? ?  ?Exercises Other Exercises ?Other Exercises: Pt attempted pericare in standing with set up for wipes, but ultimately unable to reach 2/2 limited trunk rotation and decr balance ?  ?Shoulder Instructions    ? ? ?Home Living Family/patient expects to be discharged to:: Private residence ?Living Arrangements: Spouse/significant other ?Available Help at Discharge: Family;Available 24 hours/day ?  ?  ?  ?  ?  ?  ?  ?  ?  ?  ?  ?  ?  ?  ?Additional Comments: unsure of home set up, patient on Bipap and difficult to understand. ?  ? ?  ?Prior Functioning/Environment Prior Level of Function : Independent/Modified Independent ?  ?  ?  ?  ?  ?   ?Mobility Comments: patient ambulated 80 feet with Mobility specialist on 05/19/21 ?  ?  ? ?  ?  ?OT Problem List: Cardiopulmonary status limiting activity;Decreased strength;Decreased activity tolerance;Impaired balance (sitting and/or standing);Decreased knowledge of use of DME or AE ?  ?   ?OT Treatment/Interventions: Self-care/ADL training;Therapeutic activities;Therapeutic exercise;Energy conservation;DME and/or AE instruction;Patient/family education;Balance training  ?  ?OT Goals(Current goals can be found in the care plan section) Acute Rehab OT Goals ?Patient Stated Goal: get better ?OT Goal Formulation: With patient ?Time For Goal Achievement: 06/07/21 ?Potential to Achieve Goals: Good  ?OT Frequency: Min 2X/week ?  ? ?Co-evaluation   ?  ?  ?  ?  ? ?  ?AM-PAC OT "6 Clicks" Daily Activity     ?Outcome Measure Help from another person eating meals?: None ?Help from another person taking care of personal grooming?: A Little ?Help from another person toileting, which includes using toliet, bedpan, or urinal?: A Lot ?Help  from another person bathing (including washing, rinsing, drying)?: A Lot ?Help from another person to put on and taking off regular upper body clothing?: A Little ?Help from another person to put on and taking off regular lower body clothing?: A Lot ?6 Click Score: 16 ?  ?End of Session Equipment Utilized During Treatment: Oxygen;Rolling walker (2 wheels) ?Nurse Communication: Mobility status ? ?Activity Tolerance: Patient tolerated treatment well ?Patient left: in bed;with call bell/phone within reach;with bed alarm set ? ?OT Visit Diagnosis: Other abnormalities of gait and mobility (R26.89);Muscle weakness (generalized) (M62.81)  ?              ?Time: 6553-7482 ?OT Time Calculation (min): 17 min ?Charges:  OT General Charges ?$OT Visit: 1 Visit ?OT Evaluation ?$OT Eval Moderate Complexity: 1 Mod ? ?Arman Filter., MPH, MS, OTR/L ?ascom [phone] ?05/24/21, 2:02 PM ?

## 2021-05-24 NOTE — Progress Notes (Signed)
SLP Cancellation Note ? ?Patient Details ?Name: Gregory Mckenzie ?MRN: [id number] ?DOB: 11-04-1975 ? ? ?Cancelled treatment:       Reason Eval/Treat Not Completed: Medical issues which prohibited therapy. Discussed with RN; pt tolerating BiPAP for approximately 2 hours. Per RN's discussion with MD, SLP to hold evaluation and follow-up next date for readiness. ? ?Rondel Baton, MS, CCC-SLP ?Speech-Language Pathologist ?[phone] ? ? ?[name] ?05/24/2021, 5:25 PM ? ? ?

## 2021-05-24 NOTE — Plan of Care (Signed)
Neuro: Alert and orient to self and place, difficulty with time and situation mostly due to intubation/sedation and recent hypercarbic events; up to PT/OT today-stood at bedside, makes strong effort to assist in repositioning in bed; does require more detailed explanations and education-expresses not many staff members have not been teaching at his education level/above his education level, has strong motivation to get better, expressed he has been scared (reason for prior care refusals) throughout admission due to lack of explanation by staff regarding treatments and plan ?Resp: stable on bipap throughout the day; remained fairly comfortable on 4L [place] for approximately 5 hours today, He has progressed to being able to speak full sentences without exertion, continues to have weak congested/productive cough, incentive spirometry, flutter valve and coughing/dep breathing exercises initiated today-expressed concern for suctioning secretions with bipap on, instructed to use call button to request assistance, required reenforcement to not suppress coughing ?CV: afebrile, vital signs fairly stable, slightly tachycardic throughout the day, edema has significantly improved since last week ?GIGU: foley in place, no BM today, passing gas, no emesis though gags easily with oral suctioning, tolerated 6-10 ice chips well this evening, required prompting and reminders to slow pace and not talk with anything in his mouth ?Skin: clean dry and intact, urge oral care ?Social: Significant other at bedside most of the day, communicated with family on personal cell phone, multiple teaching opportunities throughout the day, all questions and concerns addressed ? ?Will require assistance in getting bipap/cpap for home use at discharge as well as medication assistance ? ?Problem: Education: ?Goal: Ability to demonstrate management of disease process will improve ?Outcome: Progressing ?Goal: Ability to verbalize understanding of medication  therapies will improve ?Outcome: Progressing ?  ?Problem: Education: ?Goal: Knowledge of General Education information will improve ?Description: Including pain rating scale, medication(s)/side effects and non-pharmacologic comfort measures ?Outcome: Progressing ?  ?Problem: Safety: ?Goal: Ability to remain free from injury will improve ?Outcome: Progressing ?  ?Problem: Activity: ?Goal: Capacity to carry out activities will improve ?Outcome: Not Progressing ?  ?Problem: Cardiac: ?Goal: Ability to achieve and maintain adequate cardiopulmonary perfusion will improve ?Outcome: Not Progressing ?  ?Problem: Health Behavior/Discharge Planning: ?Goal: Ability to manage health-related needs will improve ?Outcome: Not Progressing ?  ?Problem: Clinical Measurements: ?Goal: Respiratory complications will improve ?Outcome: Not Progressing ?Goal: Cardiovascular complication will be avoided ?Outcome: Not Progressing ?  ?Problem: Activity: ?Goal: Risk for activity intolerance will decrease ?Outcome: Not Progressing ?  ?Problem: Nutrition: ?Goal: Adequate nutrition will be maintained ?Outcome: Not Progressing ?  ?

## 2021-05-24 NOTE — TOC Progression Note (Signed)
Transition of Care (TOC) - Progression Note  ? ? ?Patient Details  ?Name: Gregory Mckenzie ?MRN: [id number] ?Date of Birth: 06/12/1975 ? ?Transition of Care (TOC) CM/SW Contact  ?Shelbie Hutching, RN ?Phone Number: ?05/24/2021, 3:14 PM ? ?Clinical Narrative:    ?Patient will benefit from Bipap at discharge.  Patient does not have insurance.  Zach with [employer] [facility] says they may be able to accept an LOG for 3 months for the Bipap.   ?Patient was extubated yesterday, he is tolerating [place] 4L and using Bipap when sleeping.  ? ?Patient lives with his girlfriend, they have been together for about 10 years.  He is not working.  He will need assistance with medications at dc.  Prescriptions can be sent over to Medication Management. ? ? ?Expected Discharge Plan: Home/Self Care ?Barriers to Discharge: Continued Medical Work up ? ?Expected Discharge Plan and Services ?Expected Discharge Plan: Home/Self Care ?  ?Discharge Planning Services: CM Consult ?  ?Living arrangements for the past 2 months: [place] ?                ?DME Arranged: Bipap ?DME Agency: [facility] ?Date DME Agency Contacted: 05/24/21 ?Time DME Agency Contacted: [phone] ?Representative spoke with at DME Agency: Andree Coss ?HH Arranged: NA ?[place] Agency: NA ?  ?  ?  ? ? ?Social Determinants of Health (SDOH) Interventions ?  ? ?Readmission Risk Interventions ? ?  04/10/2020  ? 10:11 AM  ?Readmission Risk Prevention Plan  ?Post Dischage Appt Not Complete  ?Appt Comments First available appt at [facility] is 3/24.  ?Transportation Screening Complete  ? ? ?

## 2021-05-24 NOTE — Consult Note (Signed)
PHARMACY CONSULT NOTE ? ?Pharmacy Consult for Electrolyte Monitoring and Replacement  ? ?Recent Labs: ?Potassium (mmol/L)  ?Date Value  ?05/24/2021 5.4 (H)  ? ?Magnesium (mg/dL)  ?Date Value  ?05/24/2021 2.5 (H)  ? ?Calcium (mg/dL)  ?Date Value  ?05/24/2021 9.3  ? ?Albumin (g/dL)  ?Date Value  ?05/21/2021 3.2 (L)  ? ?Phosphorus (mg/dL)  ?Date Value  ?05/24/2021 4.6  ? ?Sodium (mmol/L)  ?Date Value  ?05/24/2021 140  ? ?Scr 1.50 > 1.35 ? ?Assessment: ?Patient is a 46 y/o M with medical history including combined systolic and diastolic CHF, HTN, Aflutter on Xarelto, DM, severe obesity, suspected OSA/possible OHS, ex 20 py smoker who is admitted with acute on chronic respiratory failure secondary to acute CHF. Patient initially admitted 3/31 but decompensated 4/5 overnight and ultimately he was intubated. Pharmacy consulted to assist with electrolyte monitoring and replacement as indicated. ? ?Goal of Therapy:  ?Electrolytes within normal limits ? ?Plan:  ?K 5.4. Renal function improving. Continue to monitor.  ?Follow-up electrolytes with AM labs tomorrow ? ?Wynelle Cleveland, PharmD ?Pharmacy Resident  ?05/24/2021 ?8:35 AM ?

## 2021-05-24 NOTE — Consult Note (Signed)
? ? ? ?Cardiology Consultation:  ? ?Patient ID: Gregory Mckenzie; [id number]; Oct 20, 1975  ? ?Admit date: 05/14/2021 ?Date of Consult: 05/24/2021 ? ?Primary Care Provider: Patient, No Pcp Per (Inactive) ?Primary Cardiologist: New to [facility] - previously evaluated by Dr. Terri Skains with [facility]  ?Primary Electrophysiologist:  None ? ? ?Patient Profile:  ? ?Gregory Mckenzie is a 46 y.o. 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 Lanney Gins. ? ?History of Present Illness:  ? ?Gregory Mckenzie was admitted to the hospital in 03/2020 with volume overload and uncontrolled hypertension in the 180s over 100s.  Echo during that admission demonstrated an EF of 40 to 45%, global hypokinesis, severe concentric LVH, grade 2 diastolic dysfunction, normal PASP, severe biatrial enlargement, trivial mitral regurgitation, and an estimated right atrial pressure of 8 mmHg.  Lexiscan MPI was undertaken during that admission which showed no evidence of ischemia with extensive wall motion abnormalities including apparent dyskinesia involving the basilar aspect of the inferior wall of the LV with an EF of 31%.  He was diuresed and started on GDMT with symptomatic improvement.  He was also noted to have a brief episode of paroxysmal atrial flutter and was initiated on Xarelto.  He converted to sinus rhythm during the admission.  He was subsequently evaluated in our transitions of care clinic in 04/2020 and scheduled for a diagnostic R/LHC in 04/2020, which demonstrated nonobstructive CAD and mildly elevated filling pressures with mild pulmonary venous hypertension.  He has been lost to follow-up since.   ? ?He was admitted to [facility] on 05/14/2021 with acute on chronic combined CHF.  High-sensitivity troponin 55 with a  delta and peak troponin of 67, subsequently downtrending.  BNP 990.  Chest x-ray concerning for pulmonary vascular congestion with mild pulmonary edema.  He was diuresed by primary service, net -10 L, with subsequent AKI leading to the holding of diuresis and GDMT.  Repeat BNP 380.  Echo demonstrated a stable cardiomyopathy with an EF of 40 to 45%, global hypokinesis, mild LVH, grade 2 diastolic dysfunction, moderately reduced RV systolic function with mildly enlarged RV cavity size, moderate biatrial enlargement, mild mitral regurgitation, moderate to severe tricuspid regurgitation, and an estimated right atrial pressure of 3 mmHg.  On 4/5 he was transferred to the ICU with decreased responsiveness with ABG demonstrating hypercarbia with a CO2 of 117.  He was initially placed on BiPAP, though ultimately required mechanical ventilation due to acute hypercarbic respiratory failure.  Concern for possible HAP.  Repeat chest x-ray showed stable cardiomegaly with areas of bibasilar atelectasis and/or infiltrate.  He has been placed on empiric antibiotics.  He was subsequently extubated and currently remains on BiPAP.  He has maintained sinus rhythm throughout his admission.  GDMT has been held given acute on CKD.  Cardiology is consulted for further management of his cardiomyopathy.  He reported to [place] he has been without his medications for at least the past month.  He is without symptoms of angina or decompensation. ? ? ? ?Past Medical History:  ?Diagnosis Date  ? Atrial flutter ([place])   ? Chronic combined systolic and diastolic CHF (congestive heart failure) ([place])   ? Hyperlipidemia   ? Hypertension   ? Obesity   ? ? ?Past Surgical History:  ?Procedure Laterality Date  ?  RIGHT/LEFT HEART CATH AND CORONARY ANGIOGRAPHY N/A 05/08/2020  ? Procedure: RIGHT/LEFT HEART CATH AND CORONARY ANGIOGRAPHY;  Surgeon: Larey Dresser, MD;  Location: [facility];  Service: Cardiovascular;  Laterality: N/A;  ?  ? ?Home  Meds: ?Prior to Admission medications   ?Medication Sig Start Date End Date Taking? Authorizing Provider  ?atorvastatin (LIPITOR) 40 MG tablet Take 1 tablet (40 mg total) by mouth daily. ?Patient not taking: Reported on 05/14/2021 05/18/20   Larey Dresser, MD  ?atorvastatin (LIPITOR) 40 MG tablet TAKE 1 TABLET (40 MG TOTAL) BY MOUTH AT BEDTIME. ?Patient not taking: Reported on 05/14/2021 04/14/20 04/14/21  Little Ishikawa, MD  ?carvedilol (COREG) 12.5 MG tablet Take 1 tablet (12.5 mg total) by mouth 2 (two) times daily. ?Patient not taking: Reported on 05/14/2021 05/18/20   Larey Dresser, MD  ?carvedilol (COREG) 12.5 MG tablet TAKE 1 TABLET (12.5 MG TOTAL) BY MOUTH TWO TIMES DAILY WITH A MEAL. ?Patient not taking: Reported on 05/14/2021 04/14/20 04/14/21  Riesa Pope, MD  ?dapagliflozin propanediol (FARXIGA) 10 MG TABS tablet Take 1 tablet (10 mg total) by mouth daily before breakfast. ?Patient not taking: Reported on 05/14/2021 04/17/20   Gregory Roan, MD  ?furosemide (LASIX) 20 MG tablet TAKE 1 TABLET (20 MG TOTAL) BY MOUTH DAILY. ?Patient not taking: Reported on 05/14/2021 04/14/20 04/14/21  Riesa Pope, MD  ?rivaroxaban (XARELTO) 20 MG TABS tablet Take 1 tablet (20 mg total) by mouth daily with supper. ?Patient not taking: Reported on 05/14/2021 05/18/20   Larey Dresser, MD  ?rivaroxaban (XARELTO) 20 MG TABS tablet TAKE 1 TABLET (20 MG TOTAL) BY MOUTH DAILY WITH SUPPER. ?Patient not taking: Reported on 05/14/2021 04/14/20 04/14/21  Riesa Pope, MD  ?sacubitril-valsartan (ENTRESTO) 49-51 MG Take 1 tablet by mouth 2 (two) times daily. ?Patient not taking: Reported on 05/14/2021 04/14/20   Riesa Pope, MD  ?spironolactone (ALDACTONE) 25 MG tablet TAKE 1/2 TABLET (12.5 MG TOTAL) BY MOUTH DAILY. ?Patient not taking: Reported on 05/14/2021 04/14/20 04/14/21  Riesa Pope, MD  ? ? ?Inpatient Medications: ?Scheduled Meds: ? albuterol  2.5 mg Nebulization BID  ? atorvastatin  40 mg Oral QHS   ? chlorhexidine gluconate (MEDLINE KIT)  15 mL Mouth Rinse BID  ? Chlorhexidine Gluconate Cloth  6 each Topical Q0600  ? insulin aspart  0-20 Units Subcutaneous Q4H  ? sodium chloride  2 spray Each Nare TID  ? sodium chloride flush  3 mL Intravenous Q12H  ? ?Continuous Infusions: ? sodium chloride    ? sodium chloride 10 mL/hr at 05/24/21 1200  ? cefTRIAXone (ROCEPHIN)  IV Stopped (05/23/21 1755)  ? heparin 2,100 Units/hr (05/24/21 1200)  ? ?PRN Meds: ?sodium chloride, acetaminophen **OR** acetaminophen, albuterol, ondansetron **OR** ondansetron (ZOFRAN) IV, polyvinyl alcohol, sodium chloride flush ? ?Allergies:  No Known Allergies ? ?Social History:   ?Social History  ? ?Socioeconomic History  ? Marital status: Single  ?  Spouse name: Not on file  ? Number of children: Not on file  ? Years of education: Not on file  ? Highest education level: Not on file  ?Occupational History  ? Not on file  ?Tobacco Use  ? Smoking status: Former  ?  Packs/day: 1.00  ?  Years: 27.00  ?  Pack years: 27.00  ?  Types: Cigarettes  ?  Quit date: 04/06/2020  ?  Years since quitting: 1.1  ? Smokeless tobacco: Never  ?Vaping Use  ? Vaping Use: Never used  ?Substance and Sexual  Activity  ? Alcohol use: Not Currently  ? Drug use: Yes  ?  Frequency: 7.0 times per week  ?  Types: Marijuana  ?  Comment: daily  ? Sexual activity: Yes  ?  Partners: Female  ?  Birth control/protection: None  ?Other Topics Concern  ? Not on file  ?Social History Narrative  ? Not on file  ? ?Social Determinants of Health  ? ?Financial Resource Strain: Not on file  ?Food Insecurity: Not on file  ?Transportation Needs: Not on file  ?Physical Activity: Not on file  ?Stress: Not on file  ?Social Connections: Not on file  ?Intimate Partner Violence: Not on file  ?  ? ?Family History:   ?Family History  ?Problem Relation Age of Onset  ? Hypertension Father   ? Hypertension Brother   ? Pulmonary embolism Brother   ? ? ?ROS:  ?Review of Systems  ?Constitutional:  Positive  for malaise/fatigue. Negative for chills, diaphoresis, fever and weight loss.  ?HENT:  Negative for congestion.   ?Eyes:  Negative for discharge and redness.  ?Respiratory:  Positive for shortness of breath. Negativ

## 2021-05-24 NOTE — Progress Notes (Signed)
Nutrition Follow Up Note  ? ?DOCUMENTATION CODES:  ? ?Morbid obesity ? ?INTERVENTION:  ? ?RD will add supplements once pt's diet is advanced.  ? ?NUTRITION DIAGNOSIS:  ? ?Inadequate oral intake related to inability to eat (pt sedated and ventilated) as evidenced by NPO status. ? ?GOAL:  ? ?Patient will meet greater than or equal to 90% of their needs ?-previously met with tube feeds  ? ?MONITOR:  ? ?Diet advancement, Labs, Weight trends, Skin, I & O's ? ?ASSESSMENT:  ? ?45 y/o male with h/o CHF, HTN, CKD III, PAF, DM, morbid obesity, medication non-compliance and marijuna use who is admitted with CHF. ? ?Pt extubated 4/9. Pt remains on bipap. SLP evaluation pending. RD will add supplements once pt's diet is advanced. Per chart, pt is down 49lbs since admit and appears to still be up ~20lbs from his UBW. PT -22.4L on his I & Os.  ? ?Medications reviewed and include: Insulin, ceftriaxone, heparin ? ?Labs reviewed: K 5.4(H), BUN 22(H), creat 1.35(H), P 4.6 wnl, Mg 2.5(H) ?Cbgs- 104, 80, 99 x 24 hrs ? ?Diet Order:   ?Diet Order   ? ? None  ? ?  ? ?EDUCATION NEEDS:  ? ?Not appropriate for education at this time ? ?Skin:  Skin Assessment: Reviewed RN Assessment ? ?Last BM:  4/9- type 6 ? ?Height:  ? ?Ht Readings from Last 1 Encounters:  ?05/23/21 5' 6" (1.676 m)  ? ? ?Weight:  ? ?Wt Readings from Last 1 Encounters:  ?05/24/21 124.7 kg  ? ? ?Ideal Body Weight:  64.5 kg ? ?BMI:  Body mass index is 44.37 kg/m?. ? ?Estimated Nutritional Needs:  ? ?Kcal:  2500-2800kcal/day ? ?Protein:  >125g/day ? ?Fluid:  2.0L/day ? ?Casey Campbell MS, RD, LDN ?Please refer to AMION for RD and/or RD on-call/weekend/after hours pager ? ?

## 2021-05-24 NOTE — Consult Note (Signed)
ANTICOAGULATION CONSULT NOTE ? ?Pharmacy Consult for IV Heparin ?Indication: atrial fibrillation ? ?Patient Measurements: ?Height: 5\' 6"  (167.6 cm) ?Weight: 124.7 kg (274 lb 14.6 oz) ?IBW/kg (Calculated) : 63.8 ?Heparin Dosing Weight: 97 kg ? ?Labs: ?Recent Labs  ?  05/21/21 ?0834 05/21/21 ?1713 05/22/21 ?0623 05/22/21 ?1428 05/22/21 ?2139 05/23/21 ?0606 05/23/21 ?1211 05/24/21 ?07/24/21  ?HGB  --    < > 17.1*  --   --  16.7  --  17.1*  ?HCT  --    < > 55.0*  --   --  54.8*  --  57.7*  ?PLT  --    < > 169  --   --  188  --  203  ?APTT 36   < > 60* 67* 61* 73*  --   --   ?LABPROT 23.1*  --   --   --   --   --   --   --   ?INR 2.1*  --   --   --   --   --   --   --   ?HEPARINUNFRC  --    < > 0.92*  --   --  0.46 0.50 0.47  ?CREATININE  --   --  1.52*  --   --  1.50*  --  1.35*  ? < > = values in this interval not displayed.  ? ? ? ?Estimated Creatinine Clearance: 84.4 mL/min (A) (by C-G formula based on SCr of 1.35 mg/dL (H)). ? ?Medications:  ?Xarelto 20 mg qSupper (last dose 4/6) ? ?Assessment: ?Patient is a 46 y/o M with medical history including combined systolic and diastolic CHF, HTN, Aflutter on Xarelto, DM, severe obesity, suspected OSA/possible OHS, ex 20 py smoker who is admitted with acute on chronic respiratory failure secondary to acute CHF. Patient initially admitted 3/31 but decompensated 4/5 overnight and ultimately he was intubated. Xarelto has been placed on hold while patient is in the ICU. Pharmacy has been consulted to initiate IV heparin in the meantime for Afib. ? ?Goal of Therapy:  ?Heparin level 0.3-0.7 units/ml ?Monitor platelets by anticoagulation protocol: Yes ? ?4/07 2308 aPTT 52, subtherapeutic ?4/08 0623 aPTT 60, HL 0.92 (not correlating), subthera    inc rate from 1600 to 1800 units/hr ?4/08 1428 aPTT 67 ?4/08 2139 aPTT 61, slightly subtherapeutic ?4/09 0606 aPTT 73, HL 0.46   therapeutic and correlating ?4/09 1211 HL 0.5  ?4/10 0618 HL 0.47 ?  ?Plan:  ?Heparin level is therapeutic x 3.  Will continue heparin at 2100 units/hr. ?Recheck heparin level and CBC with AM labs.  ? ?6/09, PharmD ?Pharmacy Resident  ?05/24/2021 ?8:14 AM ? ? ?

## 2021-05-24 NOTE — Evaluation (Signed)
Physical Therapy Evaluation ?Patient Details ?Name: Gregory Mckenzie ?MRN: [id number] ?DOB: 09-14-1975 ?Today's Date: 05/24/2021 ? ?History of Present Illness ? Gregory Mckenzie is a 46 y.o. male with medical history significant for chronic systolic heart failure and hypertension who presents to the ER for evaluation of a 1 week history of scrotal swelling associated with shortness of breath and lower extremity swelling.  He has a history of CHF and admits to being noncompliant with prescribed medications. Intubated 05/19/21, extubated 05/23/21. ?  ?Clinical Impression ? Patient received in bed, he is on Bipap. RN cleared patient for working with PT. Patient alert, oriented agrees to PT assessment. He required min A for bed mobility. Transfers with min A and was able to take a couple of steps at edge of bed with HHA/RW and min A. He is limited by respiratory status and weakness.  Patient will continue to benefit from skilled PT while here to improve strength and functional independence for return to baseline level of function.      ?   ? ?Recommendations for follow up therapy are one component of a multi-disciplinary discharge planning process, led by the attending physician.  Recommendations may be updated based on patient status, additional functional criteria and insurance authorization. ? ?Follow Up Recommendations Skilled nursing-short term rehab (<3 hours/day) ? ?  ?Assistance Recommended at Discharge    ?Patient can return home with the following ? A lot of help with walking and/or transfers;A lot of help with bathing/dressing/bathroom ? ?  ?Equipment Recommendations Rolling walker (2 wheels)  ?Recommendations for Other Services ?    ?  ?Functional Status Assessment Patient has had a recent decline in their functional status and demonstrates the ability to make significant improvements in function in a reasonable and predictable amount of time.  ? ?  ?Precautions / Restrictions Precautions ?Precautions:  Fall ?Restrictions ?Weight Bearing Restrictions: No  ? ?  ? ?Mobility ? Bed Mobility ?Overal bed mobility: Needs Assistance ?Bed Mobility: Supine to Sit, Sit to Supine ?  ?  ?Supine to sit: Modified independent (Device/Increase time) ?Sit to supine: Min assist ?  ?General bed mobility comments: min A to bring LEs back up onto bed. Cues for using rail for safety ?  ? ?Transfers ?Overall transfer level: Needs assistance ?Equipment used: 1 person hand held assist, Rolling walker (2 wheels) ?Transfers: Sit to/from Stand ?Sit to Stand: Min assist, Modified independent (Device/Increase time) ?  ?  ?  ?  ?  ?General transfer comment: patient able to stand initially with min A holding to bed rail or my hand. Stood again to attempt to clean himself using RW, min guard ?  ? ?Ambulation/Gait ?  ?Gait Distance (Feet): 2 Feet ?Assistive device: 2 person hand held assist, Rolling walker (2 wheels) ?Gait Pattern/deviations: Step-to pattern, Decreased step length - right, Decreased step length - left, Decreased stride length ?Gait velocity: decr ?  ?  ?General Gait Details: able to take a couple of side steps at edge of bed. Some difficulty weight shifting. ? ?Stairs ?  ?  ?  ?  ?  ? ?Wheelchair Mobility ?  ? ?Modified Rankin (Stroke Patients Only) ?  ? ?  ? ?Balance Overall balance assessment: Needs assistance ?Sitting-balance support: Feet unsupported, Bilateral upper extremity supported ?Sitting balance-Leahy Scale: Fair ?  ?  ?Standing balance support: Bilateral upper extremity supported, During functional activity, Reliant on assistive device for balance ?Standing balance-Leahy Scale: Fair ?  ?  ?  ?  ?  ?  ?  ?  ?  ?  ?  ?  ?   ? ? ? ?  Pertinent Vitals/Pain Pain Assessment ?Pain Assessment: No/denies pain  ? ? ?Home Living Family/patient expects to be discharged to:: Private residence ?Living Arrangements: Spouse/significant other ?Available Help at Discharge: Family;Available 24 hours/day ?  ?  ?  ?  ?  ?  ?  ?Additional  Comments: unsure of home set up, patient on Bipap and difficult to understand.  ?  ?Prior Function Prior Level of Function : Independent/Modified Independent ?  ?  ?  ?  ?  ?  ?Mobility Comments: patient ambulated 80 feet with Mobility specialist on 05/19/21 ?  ?  ? ? ?Hand Dominance  ?   ? ?  ?Extremity/Trunk Assessment  ? Upper Extremity Assessment ?Upper Extremity Assessment: Defer to OT evaluation ?  ? ?Lower Extremity Assessment ?Lower Extremity Assessment: Generalized weakness ?  ? ?Cervical / Trunk Assessment ?Cervical / Trunk Assessment: Normal  ?Communication  ? Communication: Other (comment);No difficulties (difficult to understand currently due to face mask)  ?Cognition Arousal/Alertness: Awake/alert ?Behavior During Therapy: [facility] for tasks assessed/performed ?Overall Cognitive Status: Within Functional Limits for tasks assessed ?  ?  ?  ?  ?  ?  ?  ?  ?  ?  ?  ?  ?  ?  ?  ?  ?  ?  ?  ? ?  ?General Comments   ? ?  ?Exercises Other Exercises ?Other Exercises: Performed LAQ sitting at edge of bed  ? ?Assessment/Plan  ?  ?PT Assessment Patient needs continued PT services  ?PT Problem List Decreased strength;Decreased mobility;Decreased activity tolerance;Decreased balance;Cardiopulmonary status limiting activity;Obesity ? ?   ?  ?PT Treatment Interventions DME instruction;Therapeutic exercise;Gait training;Balance training;Functional mobility training;Therapeutic activities;Patient/family education   ? ?PT Goals (Current goals can be found in the Care Plan section)  ?Acute Rehab PT Goals ?Patient Stated Goal: to get better ?PT Goal Formulation: With patient ?Time For Goal Achievement: 06/07/21 ?Potential to Achieve Goals: Fair ? ?  ?Frequency Min 2X/week ?  ? ? ?Co-evaluation   ?  ?  ?  ?  ? ? ?  ?AM-PAC PT "6 Clicks" Mobility  ?Outcome Measure Help needed turning from your back to your side while in a flat bed without using bedrails?: A Lot ?Help needed moving from lying on your back to sitting on the side of  a flat bed without using bedrails?: A Lot ?Help needed moving to and from a bed to a chair (including a wheelchair)?: A Little ?Help needed standing up from a chair using your arms (e.g., wheelchair or bedside chair)?: A Little ?Help needed to walk in hospital room?: A Lot ?Help needed climbing 3-5 steps with a railing? : Total ?6 Click Score: 13 ? ?  ?End of Session Equipment Utilized During Treatment: Oxygen ?Activity Tolerance: Patient limited by fatigue ?Patient left: in bed;with call bell/phone within reach ?Nurse Communication: Mobility status ?PT Visit Diagnosis: Other abnormalities of gait and mobility (R26.89);Muscle weakness (generalized) (M62.81);Difficulty in walking, not elsewhere classified (R26.2);Unsteadiness on feet (R26.81) ?  ? ?Time: [id number] ?PT Time Calculation (min) (ACUTE ONLY): 21 min ? ? ?Charges:   PT Evaluation ?$PT Eval Moderate Complexity: 1 Mod ?PT Treatments ?$Therapeutic Activity: 8-22 mins ?  ?   ? ? ?[employer], PT, GCS ?05/24/21,10:34 AM ? ? ?

## 2021-05-24 NOTE — Progress Notes (Signed)
SLP Cancellation Note ? ?Patient Details ?Name: Gregory Mckenzie ?MRN: [id number] ?DOB: 1975/10/16 ? ? ?Cancelled treatment:       Reason Eval/Treat Not Completed: Medical issues which prohibited therapy  ? ?SLP consult received and appreciated. Chart review completed. Spoke with RN. Per RN, pt with improving respiratory and secretion management. Tolerated prolonged period off of BiPap this AM. However, pt currently back on BiPap. Will re-attempt later this PM. RN aware and in agreement.  ? ?Cherrie Gauze, M.S., CCC-SLP ?Speech-Language Pathologist ?[facility] ?[phone] ([place])  ? ?Quintella Baton ?05/24/2021, 9:25 AM ?

## 2021-05-25 ENCOUNTER — Inpatient Hospital Stay: Payer: Self-pay

## 2021-05-25 DIAGNOSIS — J9602 Acute respiratory failure with hypercapnia: Secondary | ICD-10-CM

## 2021-05-25 LAB — HEPARIN LEVEL (UNFRACTIONATED): Heparin Unfractionated: 0.38 IU/mL (ref 0.30–0.70)

## 2021-05-25 LAB — BASIC METABOLIC PANEL
Anion gap: 9 (ref 5–15)
BUN: 23 mg/dL — ABNORMAL HIGH (ref 6–20)
CO2: 31 mmol/L (ref 22–32)
Calcium: 9.9 mg/dL (ref 8.9–10.3)
Chloride: 101 mmol/L (ref 98–111)
Creatinine, Ser: 1 mg/dL (ref 0.61–1.24)
GFR, Estimated: >60 mL/min (ref >60)
Glucose, Bld: 93 mg/dL (ref 70–99)
Potassium: 5.1 mmol/L (ref 3.5–5.1)
Sodium: 141 mmol/L (ref 135–145)

## 2021-05-25 LAB — CBC
HCT: 59.2 % — ABNORMAL HIGH (ref 39.0–52.0)
Hemoglobin: 17.2 g/dL — ABNORMAL HIGH (ref 13.0–17.0)
MCH: 28.3 pg (ref 26.0–34.0)
MCHC: 29.1 g/dL — ABNORMAL LOW (ref 30.0–36.0)
MCV: 97.4 fL (ref 80.0–100.0)
Platelets: 183 10*3/uL (ref 150–400)
RBC: 6.08 MIL/uL — ABNORMAL HIGH (ref 4.22–5.81)
RDW: 16.4 % — ABNORMAL HIGH (ref 11.5–15.5)
WBC: 7.3 10*3/uL (ref 4.0–10.5)
nRBC: 0 % (ref 0.0–0.2)

## 2021-05-25 LAB — GLUCOSE, CAPILLARY
Glucose-Capillary: 110 mg/dL — ABNORMAL HIGH (ref 70–99)
Glucose-Capillary: 121 mg/dL — ABNORMAL HIGH (ref 70–99)
Glucose-Capillary: 127 mg/dL — ABNORMAL HIGH (ref 70–99)
Glucose-Capillary: 128 mg/dL — ABNORMAL HIGH (ref 70–99)
Glucose-Capillary: 88 mg/dL (ref 70–99)
Glucose-Capillary: 92 mg/dL (ref 70–99)

## 2021-05-25 LAB — MAGNESIUM: Magnesium: 2.6 mg/dL — ABNORMAL HIGH (ref 1.7–2.4)

## 2021-05-25 LAB — PHOSPHORUS: Phosphorus: 3.8 mg/dL (ref 2.5–4.6)

## 2021-05-25 MED ORDER — ENSURE ENLIVE PO LIQD
237.0000 mL | Freq: Three times a day (TID) | ORAL | Status: DC
  Administered 2021-05-25 – 2021-05-28 (×9): 237 mL via ORAL

## 2021-05-25 MED ORDER — RIVAROXABAN 20 MG PO TABS
20.0000 mg | ORAL_TABLET | Freq: Every day | ORAL | Status: DC
  Administered 2021-05-25 – 2021-05-27 (×3): 20 mg via ORAL
  Filled 2021-05-25 (×4): qty 1

## 2021-05-25 MED ORDER — FUROSEMIDE 40 MG PO TABS
40.0000 mg | ORAL_TABLET | Freq: Every day | ORAL | Status: DC
Start: 2021-05-25 — End: 2021-05-28
  Administered 2021-05-25 – 2021-05-28 (×4): 40 mg via ORAL
  Filled 2021-05-25: qty 2
  Filled 2021-05-25 (×2): qty 1
  Filled 2021-05-25: qty 2

## 2021-05-25 MED ORDER — CARVEDILOL 3.125 MG PO TABS
3.1250 mg | ORAL_TABLET | Freq: Two times a day (BID) | ORAL | Status: DC
  Administered 2021-05-25 – 2021-05-26 (×3): 3.125 mg via ORAL
  Filled 2021-05-25 (×3): qty 1

## 2021-05-25 MED ORDER — ADULT MULTIVITAMIN W/MINERALS CH
1.0000 | ORAL_TABLET | Freq: Every day | ORAL | Status: DC
  Administered 2021-05-26 – 2021-05-28 (×3): 1 via ORAL
  Filled 2021-05-25 (×3): qty 1

## 2021-05-25 NOTE — Progress Notes (Signed)
?Progress Note ? ? ?Patient: Gregory Mckenzie [id number] DOB: 03/02/1975 DOA: 05/14/2021     11 ?DOS: the patient was seen and examined on 05/25/2021 ?  ?Brief hospital course: ?46 y.o. male  with history of chronic systolic and diastolic heart failure, HTN, atrial flutter on xarelto and BB, DM2, severe obesity and suspected OSA/possible OHS, ex 20 py smoker who was admitted on 05/14/2021 for acute on chronic hypercapnic respiratory failure in setting of acute on chronic CHF.  Required transfer to ICU on 4/5 on BiPAP and subsequently required intubation and mechanical ventilation. ? ?Significant Events: (taken from ICU notes): ?05/14/21 admitted for acute on chronic systolic and diastolic heart failure, diuresing ?05/19/21: transferred to ICU on NIV trial. Intubated.  ?05/20/21: diuresed well on Lasix gtt and Diamox ?05/21/21: therapeutic phlebotomy performed; remains on vent ?05/22/21: remains on mechanical ventilation ?05/23/21: extubated ? ?TRH assumed care again on 4/11. ? ?Patient still requiring BiPAP often, but stable respiratory status.  Physically deconditioned.  Had failed swallow, SLP following for progress as he continues to improve.  ? ?Assessment and Plan: ?* Acute combined systolic and diastolic heart failure ([place]) ?Echocardiogram performed on 05/15/2021 showed ejection fraction 40 to [id number], grade 2 diastolic dysfunction. ?Diuresed with Net IO Since Admission: -24,871.31 mL [05/25/21 1753] ?--Mgmt per cardiology ?--Strict I/O's, daily weights ?--On Coreg, Farxiga, PO Lasix 40 daily ? ?Acute respiratory failure with hypoxia ([place]) ?This is secondary to acute on chronic congestive heart failure ?Continue oxygen treatment. ? ? ?AKI (acute kidney injury) ([place]) ?Resolved.  Creatinine 1.00 today. ?Monitor BMP ? ?Non compliance w medication regimen ?On admission, pt reported not taking his medicine for the last 42-month, due to difficulty to access medical care.   ?Send all medication to med management at time of  discharge. ? ?Elevated troponin ?Due to demand ischemia in setting of acute on chronic congestive heart failure ? ?Paroxysmal atrial flutter ([place]) ?Patient is currently in sinus rhythm ?Continue Xarelto and beta-blocker. ? ?Class 3 severe obesity due to excess calories with serious comorbidity and body mass index (BMI) of 45.0 to 49.9 in adult [facility]) ?Body mass index is 43.2 kg/m?[name] ?Complicates overall care and prognosis.  Recommend lifestyle modifications including physical activity and diet for weight loss and overall long-term health. ? ? ?Type 2 diabetes mellitus with complication, without long-term current use of insulin ([place]) ?A1c 6.6, well controlled ? ? ?Hypertension ?Continue Coreg.  Also on diuretics. ? ? ? ? ?  ? ?Subjective: Patient seen this morning in ICU on BiPAP.  He was sleeping but woke easily to voice.  Denied any acute complaints including shortness of breath, chest pain, nausea vomiting, fever or chills.  No other acute complaints at this time.  Per RN, had previously attempts at swallow.  Seen by SLP again today and did much better, now on dysphagia-2 diet ? ?Physical Exam: ?Vitals:  ? 05/25/21 1000 05/25/21 1200 05/25/21 1400 05/25/21 1600  ?BP: 137/73 (!) 145/80 (!) 144/88 132/84  ?Pulse: 89 (!) 103 (!) 102 99  ?Resp: 19 (!) 27    ?Temp:      ?TempSrc:      ?SpO2: 96% 90% 93% 94%  ?Weight:      ?Height:      ? ?General exam: awake, alert, no acute distress, morbidly obese ?HEENT: wearing bipap, hearing grossly normal  ?Respiratory system: CTAB diminished due to body habitus, normal respiratory effort, on BiPAP. ?Cardiovascular system: normal S1/S2,  RRR, trace LE edema.   ?Gastrointestinal system: soft, NT ?Central  nervous system: no gross focal neurologic deficits ?Extremities: moves all, normal tone ?Skin: dry, intact, normal temperature ?Psychiatry: normal mood, congruent affect ? ? ?Data Reviewed: ? ?Notable labs: BUN 23, Mg 2.6, Hbg 17.2  ? ?Family Communication: none at bedside on  rounds ? ?Disposition: ?Status is: Inpatient ?Remains inpatient appropriate because: severity of illness, inadequate PO intake, pending PT/OT evaluations in setting of profound weakness. ? ? Planned Discharge Destination: Home ? ? ? ?Time spent: 45 minutes ? ?Author: ?Ezekiel Slocumb, DO ?05/25/2021 5:56 PM ? ?For on call review [email or web address].  ?

## 2021-05-25 NOTE — Evaluation (Addendum)
Clinical/Bedside Swallow Evaluation ?Patient Details  ?Name: Gregory Mckenzie ?MRN: [id number] ?Date of Birth: 1975-12-01 ? ?Today's Date: 05/25/2021 ?Time: SLP Start Time (ACUTE ONLY): 1200 SLP Stop Time (ACUTE ONLY): 1250 ?SLP Time Calculation (min) (ACUTE ONLY): 50 min ? ?Past Medical History:  ?Past Medical History:  ?Diagnosis Date  ? Atrial flutter (HCC)   ? Chronic combined systolic and diastolic CHF (congestive heart failure) (HCC)   ? Hyperlipidemia   ? Hypertension   ? Obesity   ? ?Past Surgical History:  ?Past Surgical History:  ?Procedure Laterality Date  ? RIGHT/LEFT HEART CATH AND CORONARY ANGIOGRAPHY N/A 05/08/2020  ? Procedure: RIGHT/LEFT HEART CATH AND CORONARY ANGIOGRAPHY;  Surgeon: Laurey Morale, MD;  Location: [facility] INVASIVE CV LAB;  Service: Cardiovascular;  Laterality: N/A;  ? ?HPI:  ?pt is a 46 y/o male with h/o CHF, HTN, CKD III, PAF, DM, morbid obesity, medication non-compliance and marijuna use.  He presents to the ER for evaluation of a 1 week history of scrotal swelling associated with shortness of breath and lower extremity swelling.  He has a history of CHF and admits to being noncompliant with prescribed medications.  According to him his medications are supposed to be shipped to his house but he has not received this in 2 months.  He admits to weight gain but is unable to tell me how much weight he has gained.  He does not have a scale at home.  He also admits to dietary indiscretion.  Per EMS he had room air pulse oximetry of 80% and is currently on 5 L of oxygen with improvement in his pulse oximetry to 91%.  ?  ?Assessment / Plan / Recommendation  ?Clinical Impression ? Pt seen for BSE today. He appears to adequately tolerate trials of thin liquids, purees, and Minced/softened foods diet w/ no overt s/s of aspiration noted during BSE. Though, he appears at min increased risk for aspiration in setting of acute illness/hospitalization and declined mental status currently. Pt is also  missing many teeth impacting efficiency and effectiveness of mastication, and the oral phase in general. Pt required min-mod verbal cues and full setup support w/ po trials/intake d/t generalized weakness in UEs. ? ?He was alert, verbally responsive and engaged in conversation w/ SLP and Wife -- improved speech intelligibility today. pt is on [place] O2 support 2-7L; wbc wnl. Dentition status is poor w/ missing teeth. Suspect this could impact both speech intelligibility and mastication of solid foods. ?Pt explained general aspiration precautions and agreed verbally to the need for following them especially sitting upright for all oral intake -- pt assisted w/ sitting fully upright d/t weakness. Trials of thin liquids via cup/straw, ice chips, purees and soft solids were presented. NO overt clinical s/s of aspiration were noted w/ any consistency; respiratory status remained calm and unlabored, vocal quality clear b/t trials, no cough. O2 sats remained 95-96%. Oral phase impairments noted included prolonged mastication and delayed A-P transfer, diffuse/min oral residue. Pt's mastication effort was more of a munching behavior vs rotary chewing. Min-Mod verbal/tactile cues provided for use of alternating foods/liquids and using a liquid wash to clear any oral residue b/f taking another bite. Pt followed instructions adequately but was often distracted.  ?OM exam WFL w/ no unilateral weakness noted. Pt fed self w/ setup support.  ?Recommend a minced foods diet to initiate oral diet; thin liquids. General aspiration precautions; setup and supervision at meals for follow through w/ precautions. Pills in puree for safer swallowing. ST  services will f/u next 1-2 days for ongoing assessment of swallowing and trials to upgrade diet as appropriate. Pt updated. NSG/MD updated, agreed.  ?SLP Visit Diagnosis: Dysphagia, oral phase (R13.11) (unsure of Baseline Cognitive/mental status) ?   ?Aspiration Risk ? Mild aspiration risk;Risk  for inadequate nutrition/hydration  ?  ?Diet Recommendation   Minced foods diet (level 2, dysphagia) w/ Thin liquids; aspiration precautions; supervision w/ all oral intake; fupport w/ feeding and positioning upright in bed for po intake.  ? ?Medication Administration: Crushed with puree  ?  ?Other  Recommendations Recommended Consults:  (Dietician f/u) ?Oral Care Recommendations: Oral care BID;Oral care before and after PO;Staff/trained caregiver to provide oral care ?Other Recommendations:  (n/a)   ? ?Recommendations for follow up therapy are one component of a multi-disciplinary discharge planning process, led by the attending physician.  Recommendations may be updated based on patient status, additional functional criteria and insurance authorization. ? ?Follow up Recommendations No SLP follow up (TBD) at d/c ? ? ?  ?Assistance Recommended at Discharge Frequent or constant Supervision/Assistance  ?Functional Status Assessment Patient has had a recent decline in their functional status and demonstrates the ability to make significant improvements in function in a reasonable and predictable amount of time.  ?Frequency and Duration min 2x/week  ?1 week ?  ?   ? ?Prognosis Prognosis for Safe Diet Advancement: Fair ?Barriers to Reach Goals: Cognitive deficits;Time post onset;Severity of deficits;Behavior;Motivation  ? ?  ? ?Swallow Study   ?General Date of Onset: 05/14/21 ?HPI: pt is a 46 y/o male with h/o CHF, HTN, CKD III, PAF, DM, morbid obesity, medication non-compliance and marijuna use.  He presents to the ER for evaluation of a 1 week history of scrotal swelling associated with shortness of breath and lower extremity swelling.  He has a history of CHF and admits to being noncompliant with prescribed medications.  According to him his medications are supposed to be shipped to his house but he has not received this in 2 months.  He admits to weight gain but is unable to tell me how much weight he has gained.  He  does not have a scale at home.  He also admits to dietary indiscretion.  Per EMS he had room air pulse oximetry of 80% and is currently on 5 L of oxygen with improvement in his pulse oximetry to 91%. ?Type of Study: Bedside Swallow Evaluation ?Previous Swallow Assessment: none ?Diet Prior to this Study: NPO (regular at home per pt) ?Temperature Spikes Noted: No (wbc 7.3) ?Respiratory Status: Nasal cannula (4L) ?History of Recent Intubation: Yes ?Length of Intubations (days): 4 days ?Date extubated: 05/23/21 ?Behavior/Cognition: Alert;Cooperative;Pleasant mood ?Oral Cavity Assessment: Dry ?Oral Care Completed by SLP: Yes ?Oral Cavity - Dentition: Poor condition;Missing dentition (most) ?Vision: Functional for self-feeding ?Self-Feeding Abilities: Able to feed self;Total assist;Needs set up;Needs assist (weak, shaky UEs) ?Patient Positioning: Upright in bed (needed positioning) ?Baseline Vocal Quality: Normal;Low vocal intensity (mumbled speech) ?Volitional Cough: Strong ?Volitional Swallow: Able to elicit  ?  ?Oral/Motor/Sensory Function Overall Oral Motor/Sensory Function: Within functional limits   ?Ice Chips Ice chips: Within functional limits ?Presentation: Spoon (fed; 5 trials)   ?Thin Liquid Thin Liquid: Within functional limits ?Presentation: Cup;Self Fed;Straw (fully supported; ~6 ozs total)  ?  ?Nectar Thick Nectar Thick Liquid: Not tested   ?Honey Thick Honey Thick Liquid: Not tested   ?Puree Puree: Within functional limits ?Presentation: Spoon (fed; 10 trials)   ?Solid ? ? ?  Solid: Impaired (softened) ?Presentation: Spoon (fed;  8 trials) ?Oral Phase Impairments: Impaired mastication;Reduced lingual movement/coordination (slow) ?Oral Phase Functional Implications: Prolonged oral transit;Impaired mastication (slow) ?Pharyngeal Phase Impairments:  (no overt)  ? ?  ? ? ? ? ?Jerilynn Som, MS, CCC-SLP ?Speech Language Pathologist ?Rehab Services; [facility] ?[phone]  (ascom) ?Orhan Mayorga ?05/25/2021,5:18 PM ? ? ? ?

## 2021-05-25 NOTE — Progress Notes (Signed)
? ? ?Progress Note ? ?Patient Name: Gregory Mckenzie ?Date of Encounter: 05/25/2021 ? ?Primary Cardiologist: New to [facility] - previously evaluated by Dr. Terri Skains with [facility]  ? ?Subjective  ? ?Reports his breathing is a little better. No chest pain. Remains on BiPAP.  ? ?Inpatient Medications  ?  ?Scheduled Meds: ? albuterol  2.5 mg Nebulization BID  ? atorvastatin  40 mg Oral QHS  ? carvedilol  3.125 mg Oral BID WC  ? chlorhexidine gluconate (MEDLINE KIT)  15 mL Mouth Rinse BID  ? Chlorhexidine Gluconate Cloth  6 each Topical Q0600  ? dapagliflozin propanediol  10 mg Oral Daily  ? furosemide  40 mg Oral Daily  ? insulin aspart  0-20 Units Subcutaneous Q4H  ? sodium chloride  2 spray Each Nare TID  ? sodium chloride flush  3 mL Intravenous Q12H  ? ?Continuous Infusions: ? sodium chloride    ? sodium chloride Stopped (05/25/21 0847)  ? cefTRIAXone (ROCEPHIN)  IV Stopped (05/24/21 1705)  ? heparin 2,100 Units/hr (05/25/21 1140)  ? ?PRN Meds: ?sodium chloride, acetaminophen **OR** acetaminophen, albuterol, ondansetron **OR** ondansetron (ZOFRAN) IV, polyvinyl alcohol, sodium chloride flush  ? ?Vital Signs  ?  ?Vitals:  ? 05/25/21 0800 05/25/21 0803 05/25/21 0900 05/25/21 1000  ?BP: (!) 146/72  135/80 137/73  ?Pulse: 85 81 87 89  ?Resp: (!) 23 13 (!) 29 19  ?Temp:  98.7 ?F (37.1 ?C)    ?TempSrc:  Axillary    ?SpO2: 95% 96% 99% 96%  ?Weight:      ?Height:      ? ? ?Intake/Output Summary (Last 24 hours) at 05/25/2021 1214 ?Last data filed at 05/25/2021 1100 ?Gross per 24 hour  ?Intake 789.04 ml  ?Output 2055 ml  ?Net -1265.96 ml  ? ?Filed Weights  ? 05/23/21 0409 05/24/21 0500 05/25/21 0500  ?Weight: 129.3 kg 124.7 kg 121.4 kg  ? ? ?Telemetry  ?  ?SR with PVCs - Personally Reviewed ? ?ECG  ?  ?No new tracings - Personally Reviewed ? ?Physical Exam  ? ?GEN: No acute distress. BiPAP in place.  ?Neck: JVD difficult to assess secondary to body habitus and respiratory support apparatus. ?Cardiac: RRR, no murmurs, rubs, or  gallops.  ?Respiratory: Mildly diminished and coarse bilaterally.  ?GI: Obese, soft, nontender, non-distended.   ?MS: No edema; No deformity. ?Neuro:  Alert and oriented x 3; Nonfocal.  ?Psych: Normal affect. ? ?Labs  ?  ?Chemistry ?Recent Labs  ?Lab 05/21/21 ?2956 05/22/21 ?2130 05/23/21 ?0606 05/24/21 ?8657 05/25/21 ?0507  ?NA 137   < > 138 140 141  ?K 3.5   < > 4.4 5.4* 5.1  ?CL 93*   < > 103 102 101  ?CO2 32   < > 28 32 31  ?GLUCOSE 138*   < > 137* 98 93  ?BUN 30*   < > 30* 22* 23*  ?CREATININE 1.59*   < > 1.50* 1.35* 1.00  ?CALCIUM 9.1   < > 8.8* 9.3 9.9  ?PROT 6.9  --   --   --   --   ?ALBUMIN 3.2*  --   --   --   --   ?AST 20  --   --   --   --   ?ALT 19  --   --   --   --   ?ALKPHOS 91  --   --   --   --   ?BILITOT 1.3*  --   --   --   --   ?  GFRNONAA 54*   < > 57* >60 >60  ?ANIONGAP 12   < > 7 6 9   ? < > = values in this interval not displayed.  ?  ? ?Hematology ?Recent Labs  ?Lab 05/23/21 ?0606 05/24/21 ?0618 05/25/21 ?0507  ?WBC 8.8 9.8 7.3  ?RBC 5.95* 5.96* 6.08*  ?HGB 16.7 17.1* 17.2*  ?HCT 54.8* 57.7* 59.2*  ?MCV 92.1 96.8 97.4  ?MCH 28.1 28.7 28.3  ?MCHC 30.5 29.6* 29.1*  ?RDW 16.8* 16.6* 16.4*  ?PLT 188 203 183  ? ? ?Cardiac EnzymesNo results for input(s): TROPONINI in the last 168 hours. No results for input(s): TROPIPOC in the last 168 hours.  ? ?BNP ?Recent Labs  ?Lab 05/21/21 ?3704  ?BNP 380.5*  ?  ? ?DDimer No results for input(s): DDIMER in the last 168 hours.  ? ?Radiology  ?  ?DG Chest Port 1 View ? ?Result Date: 05/25/2021 ?IMPRESSION: Interval extubation. Minor subsegmental atelectasis at the lung bases. Electronically Signed   By: Ofilia Neas M.D.   On: 05/25/2021 08:20   ? ?Cardiac Studies  ? ?2D echo 05/15/2021: ? 1. Left ventricular ejection fraction, by estimation, is 40 to 45%. The  ?left ventricle has mildly decreased function. The left ventricle  ?demonstrates global hypokinesis. There is mild left ventricular  ?hypertrophy. Left ventricular diastolic parameters  ?are consistent  with Grade II diastolic dysfunction (pseudonormalization).  ? 2. Right ventricular systolic function is moderately reduced. The right  ?ventricular size is mildly enlarged.  ? 3. Left atrial size was moderately dilated.  ? 4. Right atrial size was moderately dilated.  ? 5. The mitral valve is normal in structure. Mild mitral valve  ?regurgitation. No evidence of mitral stenosis.  ? 6. Tricuspid valve regurgitation is moderate to severe.  ? 7. The aortic valve is normal in structure. Aortic valve regurgitation is  ?not visualized. No aortic stenosis is present.  ? 8. The inferior vena cava is normal in size with greater than 50%  ?respiratory variability, suggesting right atrial pressure of 3 mmHg. ?__________ ?  ?R/LHC 05/08/2020: ?Prox RCA lesion is 30% stenosed. ?Mid RCA lesion is 30% stenosed. ?Ost LAD lesion is 30% stenosed. ?Prox LAD lesion is 30% stenosed. ?  ?1. Mildly elevated filling pressures.  ?2. Mild pulmonary venous hypertension.  ?3. Nonobstructive CAD.   ?  ?Nonischemic cardiomyopathy.  ?__________ ?  ?2D echo 04/08/2020: ?1. Left ventricular ejection fraction, by estimation, is 40 to 45%. The  ?left ventricle has mildly decreased function. The left ventricle  ?demonstrates global hypokinesis. There is severe concentric left  ?ventricular hypertrophy. Left ventricular diastolic  ? parameters are consistent with Grade II diastolic dysfunction  ?(pseudonormalization). Elevated left atrial pressure.  ? 2. Right ventricular systolic function was not well visualized. The right  ?ventricular size is mildly enlarged. There is normal pulmonary artery  ?systolic pressure.  ? 3. Left atrial size was severely dilated.  ? 4. Right atrial size was severely dilated.  ? 5. The mitral valve is normal in structure. Trivial mitral valve  ?regurgitation. No evidence of mitral stenosis.  ? 6. The aortic valve is normal in structure. Aortic valve regurgitation is  ?not visualized. No aortic stenosis is present.  ? 7. The  inferior vena cava is dilated in size with >50% respiratory  ?variability, suggesting right atrial pressure of 8 mmHg. ? ?Patient Profile  ?   ?46 y.o. male with history of chronic combined systolic and diastolic CHF secondary to NICM, paroxysmal atrial flutter, DM2,  HTN, HLD, CKD stage IIIa, obesity, suspected sleep apnea, possible OHS, and tobacco use who is being seen today for the evaluation of cardiomyopathy at the request of Dr. Lanney Gins. ? ?Assessment & Plan  ?  ?1. Acute hypercarbic respiratory failure secondary to possible HAP: ?-Initially required mechanical ventilation, now extubated to BiPAP ?-Management per pulmonary ?  ?2.  Acute on chronic combined systolic and diastolic CHF: ?-Presented with significant volume overload, likely in the context of the patient being without all of his medications for at least 1 month prior to admission ?-Volume status significantly improved ?-Volume status is difficult to assess on physical exam secondary to body habitus, though he appears largely euvolemic and well compensated from a CHF perspective ?-Echo this admission demonstrated a stable mild cardiomyopathy ?-Currently NPO as he has failed swallow evaluation x 2 ?-Resume GDMT when able and as tolerated from a blood pressure and CKD perspective ?-Defer addition of ACE inhibitor/ARB/ARNI/MRA at this time with underlying renal dysfunction and mild hyperkalemia ?-As needed IV furosemide for now in an effort to maintain euvolemia with net equal to slightly negative ins/outs ?  ?3.  Paroxysmal atrial flutter: ?-Isolated episode noted during admission in 03/2020 ?-Maintaining sinus rhythm this admission ?-Resume PTA beta-blocker when able ?-CHA2DS2-VASc at least 4 (CHF, HTN, DM, vascular disease) ?-Heparin drip for now until it is clear he will not require inpatient invasive procedures, and when passes his swallow evaluation with recommendation to resume PTA Xarelto prior to discharge ?-Would benefit from evaluation by EP  as an outpatient ?  ?4.  Nonobstructive CAD with mildly elevated high-sensitivity troponin: ?-Mildly elevated high-sensitivity troponin likely supply demand ischemia in the context of volume overload a

## 2021-05-25 NOTE — Consult Note (Signed)
PHARMACY CONSULT NOTE ? ?Pharmacy Consult for Electrolyte Monitoring and Replacement  ? ?Recent Labs: ?Potassium (mmol/L)  ?Date Value  ?05/25/2021 5.1  ? ?Magnesium (mg/dL)  ?Date Value  ?05/25/2021 2.6 (H)  ? ?Calcium (mg/dL)  ?Date Value  ?05/25/2021 9.9  ? ?Albumin (g/dL)  ?Date Value  ?05/21/2021 3.2 (L)  ? ?Phosphorus (mg/dL)  ?Date Value  ?05/25/2021 3.8  ? ?Sodium (mmol/L)  ?Date Value  ?05/25/2021 141  ? ?Scr 1.35 > 1.00 ? ?Assessment: ?Patient is a 46 y/o M with medical history including combined systolic and diastolic CHF, HTN, Aflutter on Xarelto, DM, severe obesity, suspected OSA/possible OHS, previous smoker (20 pack year) who is admitted with acute on chronic respiratory failure secondary to acute CHF. Patient initially admitted 3/31 but decompensated 4/5 overnight and ultimately he was intubated. Pharmacy consulted to assist with electrolyte monitoring and replacement as indicated. ? ?Goal of Therapy:  ?Electrolytes within normal limits ? ?Plan:  ?Mild hyperkalemia improved (K 5.4 > 5.1) without treatment. Renal function improving. Continue to monitor.  ?Follow-up electrolytes with AM labs tomorrow ? ?Wynelle Cleveland, PharmD ?Pharmacy Resident  ?05/25/2021 ?7:37 AM ?

## 2021-05-25 NOTE — Progress Notes (Signed)
Nutrition Follow Up Note  ? ?DOCUMENTATION CODES:  ? ?Morbid obesity ? ?INTERVENTION:  ? ?Ensure Enlive po TID, each supplement provides 350 kcal and 20 grams of protein. ? ?MVI po daily  ? ?NUTRITION DIAGNOSIS:  ? ?Inadequate oral intake related to inability to eat (pt sedated and ventilated) as evidenced by NPO status. ? ?GOAL:  ? ?Patient will meet greater than or equal to 90% of their needs ?-previously met with tube feeds  ? ?MONITOR:  ? ?Diet advancement, Labs, Weight trends, Skin, I & O's ? ?ASSESSMENT:  ? ?46 y/o male with h/o CHF, HTN, CKD III, PAF, DM, morbid obesity, medication non-compliance and marijuna use who is admitted with CHF. ? ?Pt seen by SLP today and placed on a dysphagia 2/thin liquid diet. RD will add supplements and MVI to help pt meet his estimated needs. Per chart, pt is down ~56lbs since admit but appears to still be ~13lbs up from his UBW.  ? ?Medications reviewed and include: lasix, insulin, ceftriaxone ? ?Labs reviewed: K 5.1 wnl, BUN 23(H), P 3.8 wnl, Mg 2.6(H) ?Cbgs- 110, 88, 92 x 24 hrs ? ?Diet Order:   ?Diet Order   ? ?       ?  DIET DYS 2 Room service appropriate? Yes with Assist; Fluid consistency: Thin  Diet effective now       ?  ? ?  ?  ? ?  ? ?EDUCATION NEEDS:  ? ?Not appropriate for education at this time ? ?Skin:  Skin Assessment: Reviewed RN Assessment ? ?Last BM:  4/11- type 6 ? ?Height:  ? ?Ht Readings from Last 1 Encounters:  ?05/23/21 5' 6"  (1.676 m)  ? ? ?Weight:  ? ?Wt Readings from Last 1 Encounters:  ?05/25/21 121.4 kg  ? ? ?Ideal Body Weight:  64.5 kg ? ?BMI:  Body mass index is 43.2 kg/m?. ? ?Estimated Nutritional Needs:  ? ?Kcal:  2500-2800kcal/day ? ?Protein:  >125g/day ? ?Fluid:  2.0L/day ? ?Koleen Distance MS, RD, LDN ?Please refer to AMION for RD and/or RD on-call/weekend/after hours pager ? ?

## 2021-05-25 NOTE — Consult Note (Signed)
ANTICOAGULATION CONSULT NOTE ? ?Pharmacy Consult for IV Heparin ?Indication: atrial fibrillation ? ?Patient Measurements: ?Height: 5\' 6"  (167.6 cm) ?Weight: 121.4 kg (267 lb 10.2 oz) ?IBW/kg (Calculated) : 63.8 ?Heparin Dosing Weight: 97 kg ? ?Labs: ?Recent Labs  ?  05/22/21 ?1428 05/22/21 ?2139 05/23/21 ?0606 05/23/21 ?0606 05/23/21 ?1211 05/24/21 ?[id number] 05/25/21 ?0507  ?HGB  --   --  16.7   < >  --  17.1* 17.2*  ?HCT  --   --  54.8*  --   --  57.7* 59.2*  ?PLT  --   --  188  --   --  203 183  ?APTT 67* 61* 73*  --   --   --   --   ?HEPARINUNFRC  --   --  0.46   < > 0.50 0.47 0.38  ?CREATININE  --   --  1.50*  --   --  1.35* 1.00  ? < > = values in this interval not displayed.  ? ? ? ?Estimated Creatinine Clearance: 112.1 mL/min (by C-G formula based on SCr of 1 mg/dL). ? ?Medications:  ?Xarelto 20 mg qSupper (last dose 4/6) ? ?Assessment: ?Patient is a 46 y/o M with medical history including combined systolic and diastolic CHF, HTN, Aflutter on Xarelto, DM, severe obesity, suspected OSA/possible OHS, ex 20 py smoker who is admitted with acute on chronic respiratory failure secondary to acute CHF. Patient initially admitted 3/31 but decompensated 4/5 overnight and ultimately he was intubated. Xarelto has been placed on hold while patient is in the ICU. Pharmacy has been consulted to initiate IV heparin in the meantime for Afib. ? ?Goal of Therapy:  ?Heparin level 0.3-0.7 units/ml ?Monitor platelets by anticoagulation protocol: Yes ? ?4/07 2308 aPTT 52, subtherapeutic ?4/08 0623 aPTT 60, HL 0.92 (not correlating), subthera    inc rate from 1600 to 1800 units/hr ?4/08 1428 aPTT 67 ?4/08 2139 aPTT 61, slightly subtherapeutic ?4/09 0606 aPTT 73, HL 0.46   therapeutic and correlating ?4/09 1211 HL 0.5  ?4/10 0618 HL 0.47 ?4/11 0507 HL 0.38, therapeutic X 4  ?  ?Plan:  ?4/11:  HL @ 0507 = 0.38, therapeutic X 4 ?Will continue pt on current rate and recheck HL on 4/12 with AM labs.  ? ?Tiarna Koppen D,  PharmD ?05/25/2021 ?6:39 AM ? ? ?

## 2021-05-26 DIAGNOSIS — I10 Essential (primary) hypertension: Secondary | ICD-10-CM

## 2021-05-26 DIAGNOSIS — I4892 Unspecified atrial flutter: Secondary | ICD-10-CM

## 2021-05-26 DIAGNOSIS — Z6841 Body Mass Index (BMI) 40.0 and over, adult: Secondary | ICD-10-CM

## 2021-05-26 LAB — GLUCOSE, CAPILLARY
Glucose-Capillary: 98 mg/dL (ref 70–99)
Glucose-Capillary: 99 mg/dL (ref 70–99)
Glucose-Capillary: 191 mg/dL — ABNORMAL HIGH (ref 70–99)
Glucose-Capillary: 139 mg/dL — ABNORMAL HIGH (ref 70–99)
Glucose-Capillary: 118 mg/dL — ABNORMAL HIGH (ref 70–99)

## 2021-05-26 LAB — CBC
HCT: 58.7 % — ABNORMAL HIGH (ref 39.0–52.0)
Hemoglobin: 17.4 g/dL — ABNORMAL HIGH (ref 13.0–17.0)
MCH: 28.5 pg (ref 26.0–34.0)
MCHC: 29.6 g/dL — ABNORMAL LOW (ref 30.0–36.0)
MCV: 96.1 fL (ref 80.0–100.0)
Platelets: 197 10*3/uL (ref 150–400)
RBC: 6.11 MIL/uL — ABNORMAL HIGH (ref 4.22–5.81)
RDW: 15.5 % (ref 11.5–15.5)
WBC: 6.1 10*3/uL (ref 4.0–10.5)
nRBC: 0 % (ref 0.0–0.2)

## 2021-05-26 LAB — BASIC METABOLIC PANEL
Anion gap: 7 (ref 5–15)
BUN: 24 mg/dL — ABNORMAL HIGH (ref 6–20)
CO2: 34 mmol/L — ABNORMAL HIGH (ref 22–32)
Calcium: 9.9 mg/dL (ref 8.9–10.3)
Chloride: 98 mmol/L (ref 98–111)
Creatinine, Ser: 1.16 mg/dL (ref 0.61–1.24)
GFR, Estimated: >60 mL/min (ref >60)
Glucose, Bld: 98 mg/dL (ref 70–99)
Potassium: 4.7 mmol/L (ref 3.5–5.1)
Sodium: 139 mmol/L (ref 135–145)

## 2021-05-26 LAB — MAGNESIUM: Magnesium: 2.3 mg/dL (ref 1.7–2.4)

## 2021-05-26 LAB — PHOSPHORUS: Phosphorus: 4.1 mg/dL (ref 2.5–4.6)

## 2021-05-26 MED ORDER — CARVEDILOL 6.25 MG PO TABS
6.2500 mg | ORAL_TABLET | Freq: Two times a day (BID) | ORAL | Status: DC
Start: 2021-05-26 — End: 2021-05-28
  Administered 2021-05-26 – 2021-05-28 (×4): 6.25 mg via ORAL
  Filled 2021-05-26 (×4): qty 1

## 2021-05-26 MED ORDER — LOSARTAN POTASSIUM 25 MG PO TABS
25.0000 mg | ORAL_TABLET | Freq: Every day | ORAL | Status: DC
  Administered 2021-05-27 – 2021-05-28 (×2): 25 mg via ORAL
  Filled 2021-05-26 (×4): qty 1

## 2021-05-26 NOTE — Progress Notes (Signed)
? ?Cardiology Progress Note  ? ?Patient Name: Gregory Mckenzie ?Date of Encounter: 05/26/2021 ? ?Primary Cardiologist: Prev seen by Deneen Harts, MD  ? ?Subjective  ? ?No chest pain or sob.  Abd remains bloated. ? ?Inpatient Medications  ?  ?Scheduled Meds: ? albuterol  2.5 mg Nebulization BID  ? atorvastatin  40 mg Oral QHS  ? carvedilol  3.125 mg Oral BID WC  ? chlorhexidine gluconate (MEDLINE KIT)  15 mL Mouth Rinse BID  ? Chlorhexidine Gluconate Cloth  6 each Topical Q0600  ? dapagliflozin propanediol  10 mg Oral Daily  ? feeding supplement  237 mL Oral TID BM  ? furosemide  40 mg Oral Daily  ? insulin aspart  0-20 Units Subcutaneous Q4H  ? multivitamin with minerals  1 tablet Oral Daily  ? rivaroxaban  20 mg Oral Q supper  ? sodium chloride  2 spray Each Nare TID  ? sodium chloride flush  3 mL Intravenous Q12H  ? ?Continuous Infusions: ? sodium chloride    ? sodium chloride Stopped (05/25/21 0847)  ? cefTRIAXone (ROCEPHIN)  IV 2 g (05/25/21 1733)  ? ?PRN Meds: ?sodium chloride, acetaminophen **OR** acetaminophen, albuterol, ondansetron **OR** ondansetron (ZOFRAN) IV, polyvinyl alcohol, sodium chloride flush  ? ?Vital Signs  ?  ?Vitals:  ? 05/26/21 0500 05/26/21 0600 05/26/21 0736 05/26/21 0817  ?BP:  (!) 164/88    ?Pulse: 91 91  97  ?Resp: (!) 28 (!) 21  20  ?Temp:   97.6 ?F (36.4 ?C)   ?TempSrc:   Axillary   ?SpO2: 93% 90%  93%  ?Weight: 120.8 kg     ?Height:      ? ? ?Intake/Output Summary (Last 24 hours) at 05/26/2021 0901 ?Last data filed at 05/26/2021 0737 ?Gross per 24 hour  ?Intake 660.33 ml  ?Output 3190 ml  ?Net -2529.67 ml  ? ?Filed Weights  ? 05/24/21 0500 05/25/21 0500 05/26/21 0500  ?Weight: 124.7 kg 121.4 kg 120.8 kg  ? ? ?Physical Exam  ? ?GEN: morbidly obese, in no acute distress.  ?HEENT: Grossly normal.  ?Neck: Supple, obese, difficult to gauge jvp.  No carotid bruits, or masses. ?Cardiac: RRR, no murmurs, rubs, or gallops. No clubbing, cyanosis, edema.  Radials 2+, DP/PT 2+ and equal  bilaterally.  ?Respiratory:  Respirations regular and unlabored, bibasilar crackles.  Faint exp wheezing. ?GI: Obese, protuberant, firm, nontender, BS + x 4. ?MS: no deformity or atrophy. ?Skin: warm and dry, no rash. ?Neuro:  Strength and sensation are intact. ?Psych: AAOx3.  Normal affect. ? ?Labs  ?  ?Chemistry ?Recent Labs  ?Lab 05/21/21 ?6599 05/22/21 ?3570 05/24/21 ?1779 05/25/21 ?0507 05/26/21 ?0301  ?NA 137   < > 140 141 139  ?K 3.5   < > 5.4* 5.1 4.7  ?CL 93*   < > 102 101 98  ?CO2 32   < > 32 31 34*  ?GLUCOSE 138*   < > 98 93 98  ?BUN 30*   < > 22* 23* 24*  ?CREATININE 1.59*   < > 1.35* 1.00 1.16  ?CALCIUM 9.1   < > 9.3 9.9 9.9  ?PROT 6.9  --   --   --   --   ?ALBUMIN 3.2*  --   --   --   --   ?AST 20  --   --   --   --   ?ALT 19  --   --   --   --   ?ALKPHOS  91  --   --   --   --   ?BILITOT 1.3*  --   --   --   --   ?GFRNONAA 54*   < > >60 >60 >60  ?ANIONGAP 12   < > 6 9 7   ? < > = values in this interval not displayed.  ?  ? ?Hematology ?Recent Labs  ?Lab 05/24/21 ?0618 05/25/21 ?0507 05/26/21 ?0301  ?WBC 9.8 7.3 6.1  ?RBC 5.96* 6.08* 6.11*  ?HGB 17.1* 17.2* 17.4*  ?HCT 57.7* 59.2* 58.7*  ?MCV 96.8 97.4 96.1  ?MCH 28.7 28.3 28.5  ?MCHC 29.6* 29.1* 29.6*  ?RDW 16.6* 16.4* 15.5  ?PLT 203 183 197  ? ? ?Cardiac Enzymes  ?Recent Labs  ?Lab 05/14/21 ?1149 05/14/21 ?1440 05/14/21 ?2031  ?TROPONINIHS 55* 67* 55*  ?   ? ?BNP ?   ?Component Value Date/Time  ? BNP 380.5 (H) 05/21/2021 0521  ? ? ?Lipids  ?Lab Results  ?Component Value Date  ? CHOL 139 04/07/2020  ? HDL 35 (L) 04/07/2020  ? [place] 93 04/07/2020  ? TRIG 45 05/23/2021  ? CHOLHDL 4.0 04/07/2020  ? ? ?HbA1c  ?Lab Results  ?Component Value Date  ? HGBA1C 6.6 (H) 05/14/2021  ? ? ?Radiology  ?  ?DG Chest Port 1 View ? ?Result Date: 05/25/2021 ?CLINICAL DATA:  Hypoxia EXAM: PORTABLE CHEST 1 VIEW COMPARISON:  Chest x-ray 05/21/2021 FINDINGS: Interval extubation. Heart size and mediastinum appear stable. Pulmonary vasculature within normal limits. Minor likely  subsegmental atelectasis at the lung bases. No new consolidation identified. No pleural effusion or pneumothorax. IMPRESSION: Interval extubation. Minor subsegmental atelectasis at the lung bases. Electronically Signed   By: Ofilia Neas M.D.   On: 05/25/2021 08:20   ? ?Telemetry  ?  ?RSR - Personally Reviewed ? ?Cardiac Studies  ? ?2D echo 05/15/2021: ? 1. Left ventricular ejection fraction, by estimation, is 40 to 45%. The  ?left ventricle has mildly decreased function. The left ventricle  ?demonstrates global hypokinesis. There is mild left ventricular  ?hypertrophy. Left ventricular diastolic parameters  ?are consistent with Grade II diastolic dysfunction (pseudonormalization).  ? 2. Right ventricular systolic function is moderately reduced. The right  ?ventricular size is mildly enlarged.  ? 3. Left atrial size was moderately dilated.  ? 4. Right atrial size was moderately dilated.  ? 5. The mitral valve is normal in structure. Mild mitral valve  ?regurgitation. No evidence of mitral stenosis.  ? 6. Tricuspid valve regurgitation is moderate to severe.  ? 7. The aortic valve is normal in structure. Aortic valve regurgitation is  ?not visualized. No aortic stenosis is present.  ? 8. The inferior vena cava is normal in size with greater than 50%  ?respiratory variability, suggesting right atrial pressure of 3 mmHg. ?__________ ?  ?R/LHC 05/08/2020: ?Prox RCA lesion is 30% stenosed. ?Mid RCA lesion is 30% stenosed. ?Ost LAD lesion is 30% stenosed. ?Prox LAD lesion is 30% stenosed. ?  ?1. Mildly elevated filling pressures.  ?2. Mild pulmonary venous hypertension.  ?3. Nonobstructive CAD.   ?  ?Nonischemic cardiomyopathy.  ?__________ ?  ?2D echo 04/08/2020: ?1. Left ventricular ejection fraction, by estimation, is 40 to 45%. The  ?left ventricle has mildly decreased function. The left ventricle  ?demonstrates global hypokinesis. There is severe concentric left  ?ventricular hypertrophy. Left ventricular diastolic   ? parameters are consistent with Grade II diastolic dysfunction  ?(pseudonormalization). Elevated left atrial pressure.  ? 2. Right ventricular systolic function was not  well visualized. The right  ?ventricular size is mildly enlarged. There is normal pulmonary artery  ?systolic pressure.  ? 3. Left atrial size was severely dilated.  ? 4. Right atrial size was severely dilated.  ? 5. The mitral valve is normal in structure. Trivial mitral valve  ?regurgitation. No evidence of mitral stenosis.  ? 6. The aortic valve is normal in structure. Aortic valve regurgitation is  ?not visualized. No aortic stenosis is present.  ? 7. The inferior vena cava is dilated in size with >50% respiratory  ?variability, suggesting right atrial pressure of 8 mmHg. ?_____________  ? ?Patient Profile  ?   ?46 y.o. 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 Lanney Gins. ? ?Assessment & Plan  ?  ?1.  Acute hypercarbic respiratory failure secondary to possible healthcare acquired pneumonia: Initially required mechanical ventilation, currently on nasal cannula.  Breathing steadily improving and clear to auscultation this morning.  Last dose of ceftriaxone today.  Inhalers per medicine team. ? ?2.  Acute on chronic combined systolic and diastolic congestive heart failure: EF 40 to 45% with severe concentric LVH, grade 2 diastolic dysfunction, and global hypokinesis.  Was off of all of his medications for at least 1 month prior to admission.  Notes improvement in lower extremity swelling and dyspnea.  Oral Lasix added April 11.  Minus 2.6L overnight, minus 15L since admission.  Wt down to 120.8 kg.  Renal function relatively stable with a BUN/creatinine 24/1.16, bicarb 34.  Cont ? blocker.  Adding losartan.   Outpatient medication list shows that he was on Entresto however, as above, he was not actually taking this and has had difficulty affording medications due to lack of insurance.  Will likely need medicatio

## 2021-05-26 NOTE — Progress Notes (Signed)
Patient oxygen saturations drop to 80% on room air at rest.   ?

## 2021-05-26 NOTE — Progress Notes (Signed)
1252 patient in CC 02 came off sp02 dropped to 80% please see Vitals ? ?

## 2021-05-26 NOTE — Progress Notes (Signed)
Physical Therapy Treatment ?Patient Details ?Name: Gregory Mckenzie ?MRN: [id number] ?DOB: 1975/10/07 ?Today's Date: 05/26/2021 ? ? ?History of Present Illness Gregory Mckenzie is a 46 y.o. male with medical history significant for chronic systolic heart failure and hypertension who presents to the ER for evaluation of a 1 week history of scrotal swelling associated with shortness of breath and lower extremity swelling.  He has a history of CHF and admits to being noncompliant with prescribed medications. Intubated 05/19/21, extubated 05/23/21. ? ?  ?PT Comments  ? ? Patient received in recliner. Patient agreeable to PT session. Reports he is feeling great. Wants to go home. He requires min guard for sit to stand from recliner. Ambulated 150 feet with RW and min guard. +2 assist for chair to follow/equipment. Patient progressing well, will continue to benefit from skilled PT while here to improve strength and independence with mobility.  ?   ?Recommendations for follow up therapy are one component of a multi-disciplinary discharge planning process, led by the attending physician.  Recommendations may be updated based on patient status, additional functional criteria and insurance authorization. ? ?Follow Up Recommendations ? Home health PT ?  ?  ?Assistance Recommended at Discharge Intermittent Supervision/Assistance  ?Patient can return home with the following A little help with walking and/or transfers;A little help with bathing/dressing/bathroom;Help with stairs or ramp for entrance;Assist for transportation;Assistance with cooking/housework ?  ?Equipment Recommendations ? None recommended by PT  ?  ?Recommendations for Other Services   ? ? ?  ?Precautions / Restrictions Precautions ?Precautions: Fall ?Restrictions ?Weight Bearing Restrictions: No  ?  ? ?Mobility ? Bed Mobility ?  ?  ?  ?  ?  ?  ?  ?General bed mobility comments: patient received up in recliner and remained in recliner ?  ? ?Transfers ?Overall transfer  level: Needs assistance ?Equipment used: Rolling walker (2 wheels) ?Transfers: Sit to/from Stand ?Sit to Stand: Min guard ?  ?  ?  ?  ?  ?General transfer comment: patient able to stand from recliner with min guard. Cues for hand placement ?  ? ?Ambulation/Gait ?Ambulation/Gait assistance: Min guard ?Gait Distance (Feet): 150 Feet ?Assistive device: Rolling walker (2 wheels) ?Gait Pattern/deviations: Step-through pattern, Decreased step length - right, Decreased step length - left, Decreased dorsiflexion - right, Decreased dorsiflexion - left, Decreased stride length ?Gait velocity: decr ?  ?  ?General Gait Details: patient ambulated well with RW and min guard. Patient ambulated on 3 liters. O2 sats reamined in 90%s throughout. Tried to drop down to 2 liters, but sats went into 80%s so brought back up to 3 liters. Patient took 2 seatd rest breaks during gait training. ? ? ?Stairs ?  ?  ?  ?  ?  ? ? ?Wheelchair Mobility ?  ? ?Modified Rankin (Stroke Patients Only) ?  ? ? ?  ?Balance Overall balance assessment: Needs assistance ?Sitting-balance support: Feet supported ?Sitting balance-Leahy Scale: Good ?  ?  ?Standing balance support: Bilateral upper extremity supported, During functional activity, Reliant on assistive device for balance ?Standing balance-Leahy Scale: Fair ?  ?  ?  ?  ?  ?  ?  ?  ?  ?  ?  ?  ?  ? ?  ?Cognition Arousal/Alertness: Awake/alert ?Behavior During Therapy: [facility] for tasks assessed/performed ?Overall Cognitive Status: Within Functional Limits for tasks assessed ?  ?  ?  ?  ?  ?  ?  ?  ?  ?  ?  ?  ?  ?  ?  ?  ?  ?  ?  ? ?  ?  Exercises Other Exercises ?Other Exercises: AP, LAQ, marching in sitting x 10 reps each ? ?  ?General Comments   ?  ?  ? ?Pertinent Vitals/Pain Pain Assessment ?Pain Assessment: No/denies pain  ? ? ?Home Living   ?  ?  ?  ?  ?  ?  ?  ?  ?  ?   ?  ?Prior Function    ?  ?  ?   ? ?PT Goals (current goals can now be found in the care plan section) Acute Rehab PT Goals ?Patient  Stated Goal: to go home ?PT Goal Formulation: With patient ?Time For Goal Achievement: 06/07/21 ?Potential to Achieve Goals: Good ?Progress towards PT goals: Progressing toward goals ? ?  ?Frequency ? ? ? Min 2X/week ? ? ? ?  ?PT Plan Discharge plan needs to be updated  ? ? ?Co-evaluation   ?  ?  ?  ?  ? ?  ?AM-PAC PT "6 Clicks" Mobility   ?Outcome Measure ? Help needed turning from your back to your side while in a flat bed without using bedrails?: A Little ?Help needed moving from lying on your back to sitting on the side of a flat bed without using bedrails?: A Little ?Help needed moving to and from a bed to a chair (including a wheelchair)?: A Little ?Help needed standing up from a chair using your arms (e.g., wheelchair or bedside chair)?: A Little ?Help needed to walk in hospital room?: A Little ?Help needed climbing 3-5 steps with a railing? : A Lot ?6 Click Score: 17 ? ?  ?End of Session Equipment Utilized During Treatment: Gait belt;Oxygen ?Activity Tolerance: Patient limited by fatigue ?Patient left: in chair;with call bell/phone within reach;with nursing/sitter in room ?Nurse Communication: Mobility status ?PT Visit Diagnosis: Other abnormalities of gait and mobility (R26.89);Muscle weakness (generalized) (M62.81);Difficulty in walking, not elsewhere classified (R26.2);Unsteadiness on feet (R26.81) ?  ? ? ?Time: [id number] ?PT Time Calculation (min) (ACUTE ONLY): 45 min ? ?Charges:  $Gait Training: 23-37 mins ?$Therapeutic Exercise: 8-22 mins          ?          ? ?Amanda Cockayne, PT, GCS ?05/26/21,12:09 PM ? ?

## 2021-05-26 NOTE — TOC Progression Note (Signed)
Transition of Care (TOC) - Progression Note  ? ? ?Patient Details  ?Name: Gregory Mckenzie ?MRN: [id number] ?Date of Birth: Aug 12, 1975 ? ?Transition of Care (TOC) CM/SW Contact  ?Shelbie Hutching, RN ?Phone Number: ?05/26/2021, 2:43 PM ? ?Clinical Narrative:    ?RNCM met with patient and his significant other, [name] at the bedside.  He refers to [name] as his wife.  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  The owner of the group home is Damien Fusi and the name is [facility].  Patient does not have insurance and does not have a current PCP.  Informed patient that we can get his medications at [facility] at discharge and he reports wanting to keep his care in [place].  RNCM will provide patient with information on community clinics, first available appointment is middle of May at [facility].   ?HF nurse navigator will get patient set up with the heart failure clinic.  He had an appointment on 4/10 but he is still here.   ? ?Working on getting patient oxygen, RW, and bipap from [employer].   ? ? ?Expected Discharge Plan: Home/Self Care ?Barriers to Discharge: Continued Medical Work up ? ?Expected Discharge Plan and Services ?Expected Discharge Plan: Home/Self Care ?  ?Discharge Planning Services: CM Consult ?  ?Living arrangements for the past 2 months: [place] ?                ?DME Arranged: Bipap ?DME Agency: [employer] ?Date DME Agency Contacted: 05/24/21 ?Time DME Agency Contacted: [phone] ?Representative spoke with at DME Agency: Andree Coss ?HH Arranged: NA ?[place] Agency: NA ?  ?  ?  ? ? ?Social Determinants of Health (SDOH) Interventions ?  ? ?Readmission Risk Interventions ? ?  04/10/2020  ? 10:11 AM  ?Readmission Risk Prevention Plan  ?Post Dischage Appt Not Complete  ?Appt Comments First available appt at [place] is 3/24.  ?Transportation Screening Complete  ? ? ?

## 2021-05-26 NOTE — Progress Notes (Signed)
?Progress Note ? ? ?Patient: Gregory Mckenzie [id number] DOB: 07-07-1975 DOA: 05/14/2021     12 ?DOS: the patient was seen and examined on 05/26/2021 ?  ?Brief hospital course: ?46 y.o. male  with history of chronic systolic and diastolic heart failure, HTN, atrial flutter on xarelto and BB, DM2, severe obesity and suspected OSA/possible OHS, ex 20 py smoker who was admitted on 05/14/2021 for acute on chronic hypercapnic respiratory failure in setting of acute on chronic CHF.  Required transfer to ICU on 4/5 on BiPAP and subsequently required intubation and mechanical ventilation. ? ?Significant Events: (taken from ICU notes): ?? 05/14/21 admitted for acute on chronic systolic and diastolic heart failure, diuresing ?? 05/19/21: transferred to ICU on NIV trial. Intubated.  ?? 05/20/21: diuresed well on Lasix gtt and Diamox ?? 05/21/21: therapeutic phlebotomy performed; remains on vent ?? 05/22/21: remains on mechanical ventilation ?? 05/23/21: extubated ? ?[facility] assumed care again on 4/11. ? ?Patient still requiring BiPAP often, but stable respiratory status.  Physically deconditioned.  Had failed swallow, SLP following for progress as he continues to improve.  ? ?4/12 - on 3 liters now. Transfer out to PCU ? ? ?Assessment and Plan: ?* Acute combined systolic and diastolic heart failure ([place]) ?Echocardiogram performed on 05/15/2021 showed ejection fraction 40 to [id number], grade 2 diastolic dysfunction. ?Diuresed with Net IO Since Admission: -24,871.31 mL [05/25/21 1753] ?--Mgmt per cardiology ?--Strict I/O's, daily weights ?--Increase Coreg, started Losartan. continue Farxiga, PO Lasix 40 daily ? ?Acute respiratory failure with hypoxia ([place]) ?This is secondary to acute on chronic congestive heart failure ?Now on 3 liters O2, continue weaning efforts, patient doesn't use O2 at home ? ? ?AKI (acute kidney injury) ([place]) ?Resolved.  Creatinine 1.00 today. ?Monitor BMP ? ?Non compliance w medication regimen ?On admission, pt reported not  taking his medicine for the last 61-month, due to difficulty to access medical care.   ?Send all medication to med management at time of discharge. ? ?Elevated troponin ?Due to demand ischemia in setting of acute on chronic congestive heart failure ? ?Paroxysmal atrial flutter ([place]) ?Patient is currently in sinus rhythm ?Continue Xarelto and beta-blocker ? ?Class 3 severe obesity due to excess calories with serious comorbidity and body mass index (BMI) of 45.0 to 49.9 in adult [facility]) ?Body mass index is 43.2 kg/m?[name] ?Complicates overall care and prognosis.  Recommend lifestyle modifications including physical activity and diet for weight loss and overall long-term health ? ? ?Type 2 diabetes mellitus with complication, without long-term current use of insulin ([place]) ?A1c 6.6, well controlled. SSI for now ? ? ?Hypertension ?Increase Coreg. Started Losartan. Also on diuretics. ? ? ? ? ?  ? ?Subjective: Feeling better.  Still on 3 to 4 L oxygen ? ?Physical Exam: ?Vitals:  ? 05/26/21 1230 05/26/21 1252 05/26/21 1324 05/26/21 1600  ?BP: (!) 144/99   (!) 135/93  ?Pulse: 91 93 (!) 101 93  ?Resp: 14 (!) 29 15 20   ?Temp:    98.5 ?F (36.9 ?C)  ?TempSrc:      ?SpO2: 93% (!) 80% (!) 88% 97%  ?Weight:      ?Height:      ? ?General exam: awake, alert, no acute distress, morbidly obese ?HEENT: wearing bipap, hearing grossly normal  ?Respiratory system: CTAB diminished due to body habitus, normal respiratory effort, on BiPAP. ?Cardiovascular system: normal S1/S2,  RRR, trace LE edema.   ?Gastrointestinal system: soft, NT ?Central nervous system: no gross focal neurologic deficits ?Extremities: moves all, normal tone ?Skin:  dry, intact, normal temperature ?Psychiatry: normal mood, congruent affect ? ?Data Reviewed: ?  ?There are no new results to review at this time. ? ?Family Communication: None at bedside ? ?Disposition: ?Status is: Inpatient ?Remains inpatient appropriate because: Needs ongoing oxygen weaning efforts and diuresis and  titration of cardiac medication per cardiology ? ? Planned Discharge Destination: Home with Home Health ? ? ? DVT prophylaxis-Xarelto ?Time spent: 35 minutes ? ?Author: ?Max Sane, MD ?05/26/2021 5:03 PM ? ?For on call review [email or web address].  ?

## 2021-05-26 NOTE — Consult Note (Signed)
PHARMACY CONSULT NOTE ? ?Pharmacy Consult for Electrolyte Monitoring and Replacement  ? ?Recent Labs: ?Potassium (mmol/L)  ?Date Value  ?05/26/2021 4.7  ? ?Magnesium (mg/dL)  ?Date Value  ?05/26/2021 2.3  ? ?Calcium (mg/dL)  ?Date Value  ?05/26/2021 9.9  ? ?Albumin (g/dL)  ?Date Value  ?05/21/2021 3.2 (L)  ? ?Phosphorus (mg/dL)  ?Date Value  ?05/26/2021 4.1  ? ?Sodium (mmol/L)  ?Date Value  ?05/26/2021 139  ? ?Scr 1.35 > 1.00 ? ?Assessment: ?Patient is a 46 y/o M with medical history including combined systolic and diastolic CHF, HTN, Aflutter on Xarelto, DM, severe obesity, suspected OSA/possible OHS, previous smoker (20 pack year) who is admitted with acute on chronic respiratory failure secondary to acute CHF. Patient initially admitted 3/31 but decompensated 4/5 overnight and ultimately he was intubated. Pharmacy consulted to assist with electrolyte monitoring and replacement as indicated. ? ?Goal of Therapy:  ?Electrolytes within normal limits ? ?Plan:  ?Care transitioned from CCM to hospitalist team. Will discontinue consult and follow peripherally. ? ?Wynelle Cleveland, PharmD ?Pharmacy Resident  ?05/26/2021 ?11:38 AM ?

## 2021-05-26 NOTE — Progress Notes (Signed)
Speech Language Pathology Treatment: Dysphagia  ?Patient Details ?Name: Gregory Mckenzie ?MRN: [id number] ?DOB: August 07, 1975 ?Today's Date: 05/26/2021 ?Time: 4098-1191 ?SLP Time Calculation (min) (ACUTE ONLY): 15 min ? ?Assessment / Plan / Recommendation ?Clinical Impression ? Dysphagia intervention completed this date. Pt sitting upright in chair, on 3L nasal canula, and recovering from recent ambulation with therapy upon approach. Pt alert, agreeable to session with quickened responses/processing speed compared to evaluation note. RN reporting slowed mastication for breakfast meal and increased challenge for more complex solids on tray (potatoes).  ? ?PO trials completed with soft solids and regular solids and thin liquid via straw. Oral phase impairments noted, including prolonged mastication, delayed A-P transfer, and min oral residue. Increased efficiency for mastication and clearance for soft solids vs regular. Min verbal cues provided for use of liquid wash to clear oral residue and maintaining slow rate prior to initiating subsequent bite. Education for aspiration precautions (slow rate, small bites, elevated HOB, monitor for fatigue), diet recommendations, and rationale for conserving energy/protecting self from risk of aspiration shared with pt. Pt reported understanding.   ? ?Continue to recommend Dys 2, thin liquids with medications crushed. SLP will continue to follow per POC.  ?  ?HPI HPI: pt is a 46 y/o male with h/o CHF, HTN, CKD III, PAF, DM, morbid obesity, medication non-compliance and marijuna use.  He presents to the ER for evaluation of a 1 week history of scrotal swelling associated with shortness of breath and lower extremity swelling.  He has a history of CHF and admits to being noncompliant with prescribed medications.  According to him his medications are supposed to be shipped to his house but he has not received this in 2 months. Pt currently on 3L nasal canula, with O2 saturations ranging from  92-96 during session. ?  ?   ?SLP Plan ? Continue with current plan of care ? ?  ?  ?Recommendations for follow up therapy are one component of a multi-disciplinary discharge planning process, led by the attending physician.  Recommendations may be updated based on patient status, additional functional criteria and insurance authorization. ?  ? ?Recommendations  ?Diet recommendations: Dysphagia 2 (fine chop);Thin liquid ?Liquids provided via: Straw;Cup ?Medication Administration: Crushed with puree ?Supervision: Patient able to self feed;Intermittent supervision to cue for compensatory strategies ?Compensations: Minimize environmental distractions;Slow rate;Small sips/bites;Follow solids with liquid ?Postural Changes and/or Swallow Maneuvers: Seated upright 90 degrees;Upright 30-60 min after meal  ?   ?    ?   ? ? ? ? Oral Care Recommendations: Oral care BID;Oral care before and after PO;Staff/trained caregiver to provide oral care ?Follow Up Recommendations: No SLP follow up (TBD pending progress) ?Assistance recommended at discharge: Frequent or constant Supervision/Assistance ?SLP Visit Diagnosis: Dysphagia, oral phase (R13.11) ?Plan: Continue with current plan of care ? ? ? ? ?  ?  ? ? ?Gregory Mckenzie ? ?05/26/2021, 12:14 PM ?

## 2021-05-27 DIAGNOSIS — I10 Essential (primary) hypertension: Secondary | ICD-10-CM

## 2021-05-27 DIAGNOSIS — Z91148 Patient's other noncompliance with medication regimen for other reason: Secondary | ICD-10-CM

## 2021-05-27 DIAGNOSIS — R0600 Dyspnea, unspecified: Secondary | ICD-10-CM

## 2021-05-27 DIAGNOSIS — E118 Type 2 diabetes mellitus with unspecified complications: Secondary | ICD-10-CM

## 2021-05-27 DIAGNOSIS — R778 Other specified abnormalities of plasma proteins: Secondary | ICD-10-CM

## 2021-05-27 LAB — BASIC METABOLIC PANEL WITH GFR
Anion gap: 5 (ref 5–15)
BUN: 27 mg/dL — ABNORMAL HIGH (ref 6–20)
CO2: 35 mmol/L — ABNORMAL HIGH (ref 22–32)
Calcium: 9.6 mg/dL (ref 8.9–10.3)
Chloride: 99 mmol/L (ref 98–111)
Creatinine, Ser: 1 mg/dL (ref 0.61–1.24)
GFR, Estimated: >60 mL/min
Glucose, Bld: 116 mg/dL — ABNORMAL HIGH (ref 70–99)
Potassium: 4.3 mmol/L (ref 3.5–5.1)
Sodium: 139 mmol/L (ref 135–145)

## 2021-05-27 LAB — GLUCOSE, CAPILLARY
Glucose-Capillary: 119 mg/dL — ABNORMAL HIGH (ref 70–99)
Glucose-Capillary: 164 mg/dL — ABNORMAL HIGH (ref 70–99)
Glucose-Capillary: 105 mg/dL — ABNORMAL HIGH (ref 70–99)
Glucose-Capillary: 149 mg/dL — ABNORMAL HIGH (ref 70–99)
Glucose-Capillary: 124 mg/dL — ABNORMAL HIGH (ref 70–99)
Glucose-Capillary: 96 mg/dL (ref 70–99)

## 2021-05-27 LAB — CBC
HCT: 58.5 % — ABNORMAL HIGH (ref 39.0–52.0)
Hemoglobin: 17.4 g/dL — ABNORMAL HIGH (ref 13.0–17.0)
MCH: 28.4 pg (ref 26.0–34.0)
MCHC: 29.7 g/dL — ABNORMAL LOW (ref 30.0–36.0)
MCV: 95.4 fL (ref 80.0–100.0)
Platelets: 209 K/uL (ref 150–400)
RBC: 6.13 MIL/uL — ABNORMAL HIGH (ref 4.22–5.81)
RDW: 15.4 % (ref 11.5–15.5)
WBC: 6.8 K/uL (ref 4.0–10.5)
nRBC: 0 % (ref 0.0–0.2)

## 2021-05-27 MED ORDER — SPIRONOLACTONE 25 MG PO TABS
25.0000 mg | ORAL_TABLET | Freq: Every day | ORAL | Status: DC
  Administered 2021-05-27 – 2021-05-28 (×2): 25 mg via ORAL
  Filled 2021-05-27 (×2): qty 1

## 2021-05-27 NOTE — Assessment & Plan Note (Signed)
On admission, pt reported not taking his medicine for the last 69-month, due to difficulty to access medical care.   ?Send all medication to med management at time of discharge. ?

## 2021-05-27 NOTE — TOC Progression Note (Addendum)
Transition of Care (TOC) - Progression Note  ? ? ?Patient Details  ?Name: Gregory Mckenzie ?MRN: [id number] ?Date of Birth: 02-Jan-1976 ? ?Transition of Care (TOC) CM/SW Contact  ?Gildardo Griffes, LCSW ?Phone Number: ?05/27/2021, 11:50 AM ? ?Clinical Narrative:    ? ?Per Ian Malkin with [employer], oxygen was brought to bedside and their staff Jillyn Hidden) will do bipap set up in patient's room this afternoon.  ? ?[employer] plans to deliver walker at bedside tomorrow  morning 4/14. ? ?CSW has made referral to [facility] [place] with [facility] for [facility] PT and informed him of potential dc tomorrow.  ? ? ?Expected Discharge Plan: Home/Self Care ?Barriers to Discharge: Continued Medical Work up ? ?Expected Discharge Plan and Services ?Expected Discharge Plan: Home/Self Care ?  ?Discharge Planning Services: CM Consult ?  ?Living arrangements for the past 2 months: Single Family Home ?                ?DME Arranged: Bipap ?DME Agency: [employer] ?Date DME Agency Contacted: 05/24/21 ?Time DME Agency Contacted: 1513 ?Representative spoke with at DME Agency: Oletha Cruel ?HH Arranged: NA ?HH Agency: NA ?  ?  ?  ? ? ?Social Determinants of Health (SDOH) Interventions ?  ? ?Readmission Risk Interventions ? ?  04/10/2020  ? 10:11 AM  ?Readmission Risk Prevention Plan  ?Post Dischage Appt Not Complete  ?Appt Comments First available appt at [facility] is 3/24.  ?Transportation Screening Complete  ? ? ?

## 2021-05-27 NOTE — Assessment & Plan Note (Signed)
Due to demand ischemia in setting of acute on chronic congestive heart failure ?

## 2021-05-27 NOTE — Progress Notes (Signed)
?Progress Note ? ? ?Patient: Gregory Mckenzie [id number] DOB: 02/16/1975 DOA: 05/14/2021     13 ?DOS: the patient was seen and examined on 05/27/2021 ?  ?Brief hospital course: ?46 y.o. male  with history of chronic systolic and diastolic heart failure, HTN, atrial flutter on xarelto and BB, DM2, severe obesity and suspected OSA/possible OHS, ex 20 py smoker who was admitted on 05/14/2021 for acute on chronic hypercapnic respiratory failure in setting of acute on chronic CHF.  Required transfer to ICU on 4/5 on BiPAP and subsequently required intubation and mechanical ventilation. ? ?Significant Events: (taken from ICU notes): ?? 05/14/21 admitted for acute on chronic systolic and diastolic heart failure, diuresing ?? 05/19/21: transferred to ICU on NIV trial. Intubated.  ?? 05/20/21: diuresed well on Lasix gtt and Diamox ?? 05/21/21: therapeutic phlebotomy performed; remains on vent ?? 05/22/21: remains on mechanical ventilation ?? 05/23/21: extubated ? ?TRH assumed care again on 4/11. ? ?Patient still requiring BiPAP often, but stable respiratory status.  Physically deconditioned.  Had failed swallow, SLP following for progress as he continues to improve.  ? ?4/12 - on 3 liters now. Transfer out to PCU ?4/13-PT, OT recommends [facility], OT ? ? ?Assessment and Plan: ?* Acute combined systolic and diastolic heart failure ([place]) ?Echocardiogram performed on 05/15/2021 showed ejection fraction 40 to [id number], grade 2 diastolic dysfunction. ?Diuresed with Net IO Since Admission: -26,793.27 mL [05/27/21 1635] ?--Mgmt per cardiology ?--Strict I/O's, daily weights ?-Continue Coreg, cardio planning to transition from losartan to Entresto. continue Farxiga, PO Lasix 40 daily ? ?Acute respiratory failure with hypoxia ([place]) ?This is secondary to acute on chronic congestive heart failure ?Now on 2 liters O2, continue weaning efforts, patient doesn't use O2 at home. ?TOC working to get CPAP/BiPAP and/or oxygen at discharge ? ? ?AKI (acute kidney  injury) ([place]) ?Resolved.  ?Monitor BMP ? ?Non compliance w medication regimen ?On admission, pt reported not taking his medicine for the last 41-month, due to difficulty to access medical care.   ?Send all medication to med management at time of discharge. ? ?Elevated troponin ?Due to demand ischemia in setting of acute on chronic congestive heart failure ? ?Paroxysmal atrial flutter ([place]) ?Patient is currently in sinus rhythm ?Continue Xarelto and beta-blocker ? ?Class 3 severe obesity due to excess calories with serious comorbidity and body mass index (BMI) of 45.0 to 49.9 in adult [facility]) ?Body mass index is 43.06 kg/m?[name] ?Complicates overall care and prognosis.  Recommend lifestyle modifications including physical activity and diet for weight loss and overall long-term health. ? ? ?Type 2 diabetes mellitus with complication, without long-term current use of insulin ([place]) ?A1c 6.6, well controlled. SSI for now ? ? ?Hypertension ?Continue Coreg, Losartan. Also on diuretics. ? ? ? ? ?  ? ?Subjective: Feeling better.  Hopeful to go home tomorrow ? ?Physical Exam: ?Vitals:  ? 05/27/21 0437 05/27/21 0538 05/27/21 0731 05/27/21 1150  ?BP: (!) 159/96  135/87 122/83  ?Pulse: 92  99 (!) 103  ?Resp: 16  20 18   ?Temp: 98.7 ?F (37.1 ?C)  98.6 ?F (37 ?C) 99.3 ?F (37.4 ?C)  ?TempSrc: Oral     ?SpO2: 100%  99% 100%  ?Weight:  121 kg    ?Height:      ? ?General exam: awake, alert, no acute distress, morbidly obese ?HEENT:?wearing bipap,?hearing grossly normal  ?Respiratory system: CTAB?diminished due to body habitus, normal respiratory effort, on BiPAP. ?Cardiovascular system:?normal S1/S2, ?RRR, trace LE?edema. ? ?Gastrointestinal system:?soft, NT ?Central nervous system:?no gross  focal neurologic deficits ?Extremities: moves all, normal tone ?Skin: dry, intact, normal temperature ?Psychiatry:?normal mood, congruent affect ? ?Data Reviewed: ? ?There are no new results to review at this time. ? ?Family Communication:  None ? ?Disposition: ?Status is: Inpatient ?Remains inpatient appropriate because: Getting diuresed in setting of oxygen, BiPAP/CPAP and weaning oxygen ? ? Planned Discharge Destination: Home with Home Health ? ? ? DVT prophylaxis-Xarelto ?Time spent: 35 minutes ? ?Author: ?Max Sane, MD ?05/27/2021 4:39 PM ? ?For on call review [email or web address].  ?

## 2021-05-27 NOTE — Assessment & Plan Note (Signed)
Resolved.  Monitor BMP ?

## 2021-05-27 NOTE — Assessment & Plan Note (Signed)
A1c 6.6, well controlled. SSI for now ? ?

## 2021-05-27 NOTE — Progress Notes (Signed)
? ?  Heart Failure Nurse Navigator Note ? ?I had not visited with patient while he was in the ICU intubated and on Bipap. ? ?Also earlier in hospitalization had not had the opportunity to visit with family members.  Today significant other is present. ? ?Discussed the importance of daily weights, recording, sticking with low sodium food, making wise choices when eating at restaurants, and sticking with fluid restriction  of 64 ounces daily.  She states it is nothing for him to sit down and drink bottled water- one after the other.  Explained why that is needed to change. ? ?Also went over symptoms to report.  They had no further questions. ? ?Will continue to follow. ? ?Tresa Endo RN CHFN ?

## 2021-05-27 NOTE — Assessment & Plan Note (Signed)
Echocardiogram performed on 05/15/2021 showed ejection fraction 40 to [id number], grade 2 diastolic dysfunction. ?Diuresed with Net IO Since Admission: -26,793.27 mL [05/27/21 1635] ?--Mgmt per cardiology ?--Strict I/O's, daily weights ?-Continue Coreg, cardio planning to transition from losartan to Entresto. continue Farxiga, PO Lasix 40 daily ?

## 2021-05-27 NOTE — Progress Notes (Signed)
Occupational Therapy Treatment ?Patient Details ?Name: Gregory Mckenzie ?MRN: [id number] ?DOB: 11-17-1975 ?Today's Date: 05/27/2021 ? ? ?History of present illness Gregory Mckenzie is a 46 y.o. male with medical history significant for chronic systolic heart failure and hypertension who presents to the ER for evaluation of a 1 week history of scrotal swelling associated with shortness of breath and lower extremity swelling.  He has a history of CHF and admits to being noncompliant with prescribed medications. Intubated 05/19/21, extubated 05/23/21. ?  ?OT comments ? Pt received seated EOB attempting to replace his nasal cannula with limited success. Pt reports having just returned from the bathroom on his own, no O2 donned. Pt instructed in home/routines modifications, falls prevention, home O2 safety, incentive spirometer and flutter valve use to promote lung function and increasing activity tolerance and safety for ADL and IADL. Pt verbalized understanding and would benefit from additional skilled OT services to address impairments and functional deficits. Based on progress, discharge recommendation updated to [facility] services.   ? ?Recommendations for follow up therapy are one component of a multi-disciplinary discharge planning process, led by the attending physician.  Recommendations may be updated based on patient status, additional functional criteria and insurance authorization. ?   ?Follow Up Recommendations ? Home health OT  ?  ?Assistance Recommended at Discharge Frequent or constant Supervision/Assistance  ?Patient can return home with the following ? A little help with walking and/or transfers;Assistance with cooking/housework;Assist for transportation;Help with stairs or ramp for entrance;Direct supervision/assist for medications management;A little help with bathing/dressing/bathroom ?  ?Equipment Recommendations ? Other (comment) (2WW)  ?  ?Recommendations for Other Services   ? ?  ?Precautions / Restrictions  Precautions ?Precautions: Fall ?Restrictions ?Weight Bearing Restrictions: No  ? ? ?  ? ?Mobility Bed Mobility ?Overal bed mobility: Modified Independent ?  ?  ?  ?  ?  ?  ?  ?  ? ?Transfers ?  ?  ?  ?  ?  ?  ?  ?  ?  ?  ?  ?  ?Balance Overall balance assessment: Needs assistance ?Sitting-balance support: Feet supported ?Sitting balance-Leahy Scale: Good ?  ?  ?  ?  ?  ?  ?  ?  ?  ?  ?  ?  ?  ?  ?  ?  ?   ? ?ADL either performed or assessed with clinical judgement  ? ?ADL   ?  ?  ?  ?  ?  ?  ?  ?  ?  ?  ?  ?  ?  ?  ?  ?  ?  ?  ?  ?General ADL Comments: Pt reports having just returned from the bathroom on his own, no O2 donned and was attempting to don the nasal cannula with limited success upon OT's arrival. ?  ? ?Extremity/Trunk Assessment   ?  ?  ?  ?  ?  ? ?Vision   ?  ?  ?Perception   ?  ?Praxis   ?  ? ?Cognition Arousal/Alertness: Awake/alert ?Behavior During Therapy: [facility] for tasks assessed/performed ?Overall Cognitive Status: No family/caregiver present to determine baseline cognitive functioning ?  ?  ?  ?  ?  ?  ?  ?  ?  ?  ?  ?  ?  ?  ?  ?  ?General Comments: decr safety awareness ?  ?  ?   ?Exercises Other Exercises ?Other Exercises: Pt instructed in home/routines modifications, falls prevention, O2 safety, incentive  spirometer and flutter valve use to promote lung function and increasing activity tolerance and safety for ADL and IADL ? ?  ?Shoulder Instructions   ? ? ?  ?General Comments SpO2 remained >90% on 2 and 3L O2 throughout session, HR ranged from 101-133bpm  ? ? ?Pertinent Vitals/ Pain       Pain Assessment ?Pain Assessment: No/denies pain ? ?Home Living   ?  ?  ?  ?  ?  ?  ?  ?  ?  ?  ?  ?  ?  ?  ?  ?  ?  ?  ? ?  ?Prior Functioning/Environment    ?  ?  ?  ?   ? ?Frequency ? Min 2X/week  ? ? ? ? ?  ?Progress Toward Goals ? ?OT Goals(current goals can now be found in the care plan section) ? Progress towards OT goals: Progressing toward goals ? ?Acute Rehab OT Goals ?Patient Stated Goal: get  better ?OT Goal Formulation: With patient ?Time For Goal Achievement: 06/07/21 ?Potential to Achieve Goals: Good  ?Plan Frequency remains appropriate;Discharge plan needs to be updated   ? ?Co-evaluation ? ? ?   ?  ?  ?  ?  ? ?  ?AM-PAC OT "6 Clicks" Daily Activity     ?Outcome Measure ? ? Help from another person eating meals?: None ?Help from another person taking care of personal grooming?: A Little ?Help from another person toileting, which includes using toliet, bedpan, or urinal?: A Lot ?Help from another person bathing (including washing, rinsing, drying)?: A Lot ?Help from another person to put on and taking off regular upper body clothing?: A Little ?Help from another person to put on and taking off regular lower body clothing?: A Lot ?6 Click Score: 16 ? ?  ?End of Session Equipment Utilized During Treatment: Oxygen ? ?OT Visit Diagnosis: Other abnormalities of gait and mobility (R26.89);Muscle weakness (generalized) (M62.81) ?  ?Activity Tolerance Patient tolerated treatment well ?  ?Patient Left in bed;with call bell/phone within reach ?  ?Nurse Communication Mobility status ?  ? ?   ? ?Time: 3329-5188 ?OT Time Calculation (min): 14 min ? ?Charges: OT General Charges ?$OT Visit: 1 Visit ?OT Treatments ?$Self Care/Home Management : 8-22 mins ? ?Arman Filter., MPH, MS, OTR/L ?ascom [phone] ?05/27/21, 2:57 PM ? ?

## 2021-05-27 NOTE — Assessment & Plan Note (Signed)
Continue Coreg, Losartan. Also on diuretics. ?

## 2021-05-27 NOTE — Progress Notes (Signed)
Physical Therapy Treatment ?Patient Details ?Name: Gregory Mckenzie ?MRN: [id number] ?DOB: April 16, 1975 ?Today's Date: 05/27/2021 ? ? ?History of Present Illness Gregory Mckenzie is a 46 y.o. male with medical history significant for chronic systolic heart failure and hypertension who presents to the ER for evaluation of a 1 week history of scrotal swelling associated with shortness of breath and lower extremity swelling.  He has a history of CHF and admits to being noncompliant with prescribed medications. Intubated 05/19/21, extubated 05/23/21. ? ?  ?PT Comments  ? ? Physical Therapy treatment completed this date. Patient tolerated session well and was agreeable to treatment. No pain reported throughout session. Upon arrival patient was supine with HOB elevated and family present. Patient able to demonstrate Mod I for all bed mobility with increased time and effort. Sit to stands from EOB with and without RW was completed at supervision. Additional 8 reps completed for BLE strengthening. Increased tolerance to ambulating around the nurses station with SBA/SUP and RW with no seated rest breaks (+2 assist for chair follow). Patient's O2 remained >90% during ambulation on 3L. Dropped O2 down to 2L via [place] and patient remained >90%. RN notified. Additional therapeutic exercises were completed to focus on strengthening of the bilateral lower extremities. Patient is progressing towards his goals and would continue to benefit from skilled physical therapy in order to optimize patient's return to to PLOF. Continue to recommend HHPT upon discharge from acute hospitalization.  ?  ?Recommendations for follow up therapy are one component of a multi-disciplinary discharge planning process, led by the attending physician.  Recommendations may be updated based on patient status, additional functional criteria and insurance authorization. ? ?Follow Up Recommendations ? Home health PT ?  ?  ?Assistance Recommended at Discharge Intermittent  Supervision/Assistance  ?Patient can return home with the following A little help with walking and/or transfers;A little help with bathing/dressing/bathroom;Help with stairs or ramp for entrance;Assist for transportation;Assistance with cooking/housework ?  ?Equipment Recommendations ? None recommended by PT  ?  ?Recommendations for Other Services   ? ? ?  ?Precautions / Restrictions Precautions ?Precautions: Fall ?Restrictions ?Weight Bearing Restrictions: No  ?  ? ?Mobility ? Bed Mobility ?Overal bed mobility: Modified Independent ?Bed Mobility: Sit to Supine, Supine to Sit ?  ?  ?Supine to sit: Modified independent (Device/Increase time) ?Sit to supine: Modified independent (Device/Increase time) ?  ?General bed mobility comments: increased time and effort, however no physical assistance required ?  ? ?Transfers ?Overall transfer level: Needs assistance ?Equipment used: Rolling walker (2 wheels) ?Transfers: Sit to/from Stand ?Sit to Stand: Supervision ?  ?  ?  ?  ?  ?General transfer comment: additional 8 reps for BLE strengthening ?  ? ?Ambulation/Gait ?Ambulation/Gait assistance: Supervision ?Gait Distance (Feet): 180 Feet ?Assistive device: Rolling walker (2 wheels) ?Gait Pattern/deviations: Step-through pattern, Decreased step length - right, Decreased step length - left, Decreased dorsiflexion - right, Decreased dorsiflexion - left, Decreased stride length ?Gait velocity: decreased ?  ?  ?General Gait Details: Patient ambulated well with RW and SBA/SUP. Patient initially ambulated on 3L via [place] and remained >90%. O2 dropped down to 2L, and patient remained >90% via [place]. No seated rest breaks required throughout ambulation, however seated rest breaks performed for checking O2 ? ? ?Stairs ?  ?  ?  ?  ?  ? ? ?Wheelchair Mobility ?  ? ?Modified Rankin (Stroke Patients Only) ?  ? ? ?  ?Balance Overall balance assessment: Needs assistance ?Sitting-balance support: Feet supported ?Sitting  balance-Leahy Scale: Good ?   ?  ?Standing balance support: Bilateral upper extremity supported, During functional activity, Reliant on assistive device for balance ?Standing balance-Leahy Scale: Fair ?  ?  ?  ?  ?  ?  ?  ?  ?  ?  ?  ?  ?  ? ?  ?Cognition Arousal/Alertness: Awake/alert ?Behavior During Therapy: [facility] for tasks assessed/performed ?Overall Cognitive Status: Within Functional Limits for tasks assessed ?  ?  ?  ?  ?  ?  ?  ?  ?  ?  ?  ?  ?  ?  ?  ?  ?  ?  ?  ? ?  ?Exercises General Exercises - Lower Extremity ?Long Arc Quad: Seated, 10 reps, AROM, Both (w/ 3 second isometric hold) ?Straight Leg Raises: Supine, 10 reps, AROM, Both ?Hip Flexion/Marching: Seated, 20 reps, AROM, Both ? ?  ?General Comments General comments (skin integrity, edema, etc.): SpO2 remained >90% on 2 and 3L O2 throughout session, HR ranged from 101-133bpm ?  ?  ? ?Pertinent Vitals/Pain Pain Assessment ?Pain Assessment: No/denies pain  ? ? ?Home Living   ?  ?  ?  ?  ?  ?  ?  ?  ?  ?   ?  ?Prior Function    ?  ?  ?   ? ?PT Goals (current goals can now be found in the care plan section) Acute Rehab PT Goals ?Patient Stated Goal: to go home ?PT Goal Formulation: With patient ?Time For Goal Achievement: 06/07/21 ?Potential to Achieve Goals: Good ?Progress towards PT goals: Progressing toward goals ? ?  ?Frequency ? ? ? Min 2X/week ? ? ? ?  ?PT Plan Current plan remains appropriate  ? ? ?Co-evaluation   ?  ?  ?  ?  ? ?  ?AM-PAC PT "6 Clicks" Mobility   ?Outcome Measure ? Help needed turning from your back to your side while in a flat bed without using bedrails?: A Little ?Help needed moving from lying on your back to sitting on the side of a flat bed without using bedrails?: A Little ?Help needed moving to and from a bed to a chair (including a wheelchair)?: A Little ?Help needed standing up from a chair using your arms (e.g., wheelchair or bedside chair)?: A Little ?Help needed to walk in hospital room?: A Little ?Help needed climbing 3-5 steps with a railing? : A  Lot ?6 Click Score: 17 ? ?  ?End of Session Equipment Utilized During Treatment: Gait belt;Oxygen ?Activity Tolerance: Patient limited by fatigue ?Patient left: in bed;with call bell/phone within reach;with bed alarm set;with family/visitor present ?Nurse Communication: Mobility status ?PT Visit Diagnosis: Other abnormalities of gait and mobility (R26.89);Muscle weakness (generalized) (M62.81);Difficulty in walking, not elsewhere classified (R26.2);Unsteadiness on feet (R26.81) ?  ? ? ?Time: 1962-2297 ?PT Time Calculation (min) (ACUTE ONLY): 33 min ? ?Charges:  $Gait Training: 8-22 mins ?$Therapeutic Exercise: 8-22 mins          ?          ? ?Angelica Ran, PT  ?05/27/21. 11:30 AM ? ? ?

## 2021-05-27 NOTE — Progress Notes (Signed)
? ?Cardiology Progress Note  ? ?Patient Name: Gregory Mckenzie ?Date of Encounter: 05/27/2021 ? ?Primary Cardiologist: None - prev seen by Deneen Harts, MD  ? ?Subjective  ? ?Slept lying relatively flat last night.  Remains on O2 via [place] (dsat to 80% off of O2 yesterday).  No chest pain, dyspnea, or edema. ? ?Inpatient Medications  ?  ?Scheduled Meds: ? atorvastatin  40 mg Oral QHS  ? carvedilol  6.25 mg Oral BID WC  ? chlorhexidine gluconate (MEDLINE KIT)  15 mL Mouth Rinse BID  ? Chlorhexidine Gluconate Cloth  6 each Topical Q0600  ? dapagliflozin propanediol  10 mg Oral Daily  ? feeding supplement  237 mL Oral TID BM  ? furosemide  40 mg Oral Daily  ? insulin aspart  0-20 Units Subcutaneous Q4H  ? losartan  25 mg Oral Daily  ? multivitamin with minerals  1 tablet Oral Daily  ? rivaroxaban  20 mg Oral Q supper  ? sodium chloride  2 spray Each Nare TID  ? sodium chloride flush  3 mL Intravenous Q12H  ? ?Continuous Infusions: ? sodium chloride    ? sodium chloride Stopped (05/25/21 0847)  ? ?PRN Meds: ?sodium chloride, acetaminophen **OR** acetaminophen, albuterol, ondansetron **OR** ondansetron (ZOFRAN) IV, polyvinyl alcohol, sodium chloride flush  ? ?Vital Signs  ?  ?Vitals:  ? 05/26/21 2341 05/27/21 0437 05/27/21 0538 05/27/21 0731  ?BP: 138/83 (!) 159/96  135/87  ?Pulse: (!) 101 92  99  ?Resp: 17 16  20   ?Temp:  98.7 ?F (37.1 ?C)  98.6 ?F (37 ?C)  ?TempSrc:  Oral    ?SpO2: 91% 100%  99%  ?Weight:   121 kg   ?Height:      ? ? ?Intake/Output Summary (Last 24 hours) at 05/27/2021 0938 ?Last data filed at 05/27/2021 4098 ?Gross per 24 hour  ?Intake 763.04 ml  ?Output 1700 ml  ?Net -936.96 ml  ? ?Filed Weights  ? 05/25/21 0500 05/26/21 0500 05/27/21 0538  ?Weight: 121.4 kg 120.8 kg 121 kg  ? ? ?Physical Exam  ? ?GEN: Obese, in no acute distress.  ?HEENT: Grossly normal ?Neck: Supple, obese, difficult to gauge JVP, no carotid bruits or masses. ?Cardiac: RRR, no murmurs, rubs, or gallops. No clubbing, cyanosis, edema.   Radials 2+, DP/PT 2+ and equal bilaterally.  ?Respiratory:  Respirations regular and unlabored, clear to auscultation bilaterally. ?GI: Obese, firm, protuberant, nontender, BS + x 4. ?MS: no deformity or atrophy. ?Skin: warm and dry, no rash. ?Neuro:  Strength and sensation are intact. ?Psych: AAOx3.  Normal affect. ? ?Labs  ?  ?Chemistry ?Recent Labs  ?Lab 05/21/21 ?1191 05/22/21 ?4782 05/25/21 ?0507 05/26/21 ?0301 05/27/21 ?9562  ?NA 137   < > 141 139 139  ?K 3.5   < > 5.1 4.7 4.3  ?CL 93*   < > 101 98 99  ?CO2 32   < > 31 34* 35*  ?GLUCOSE 138*   < > 93 98 116*  ?BUN 30*   < > 23* 24* 27*  ?CREATININE 1.59*   < > 1.00 1.16 1.00  ?CALCIUM 9.1   < > 9.9 9.9 9.6  ?PROT 6.9  --   --   --   --   ?ALBUMIN 3.2*  --   --   --   --   ?AST 20  --   --   --   --   ?ALT 19  --   --   --   --   ?  ALKPHOS 91  --   --   --   --   ?BILITOT 1.3*  --   --   --   --   ?GFRNONAA 54*   < > >60 >60 >60  ?ANIONGAP 12   < > 9 7 5   ? < > = values in this interval not displayed.  ?  ? ?Hematology ?Recent Labs  ?Lab 05/25/21 ?0507 05/26/21 ?0301 05/27/21 ?2956  ?WBC 7.3 6.1 6.8  ?RBC 6.08* 6.11* 6.13*  ?HGB 17.2* 17.4* 17.4*  ?HCT 59.2* 58.7* 58.5*  ?MCV 97.4 96.1 95.4  ?MCH 28.3 28.5 28.4  ?MCHC 29.1* 29.6* 29.7*  ?RDW 16.4* 15.5 15.4  ?PLT 183 197 209  ? ? ?Cardiac Enzymes  ?Recent Labs  ?Lab 05/14/21 ?1149 05/14/21 ?1440 05/14/21 ?2031  ?TROPONINIHS 55* 67* 55*  ?   ? ?BNP ?   ?Component Value Date/Time  ? BNP 380.5 (H) 05/21/2021 0521  ? ? ?Lipids  ?Lab Results  ?Component Value Date  ? CHOL 139 04/07/2020  ? HDL 35 (L) 04/07/2020  ? [place] 93 04/07/2020  ? TRIG 45 05/23/2021  ? CHOLHDL 4.0 04/07/2020  ? ? ?HbA1c  ?Lab Results  ?Component Value Date  ? HGBA1C 6.6 (H) 05/14/2021  ? ? ?Radiology  ?  ?DG Chest Port 1 View ? ?Result Date: 05/25/2021 ?CLINICAL DATA:  Hypoxia EXAM: PORTABLE CHEST 1 VIEW COMPARISON:  Chest x-ray 05/21/2021 FINDINGS: Interval extubation. Heart size and mediastinum appear stable. Pulmonary vasculature within  normal limits. Minor likely subsegmental atelectasis at the lung bases. No new consolidation identified. No pleural effusion or pneumothorax. IMPRESSION: Interval extubation. Minor subsegmental atelectasis at the lung bases. Electronically Signed   By: Ofilia Neas M.D.   On: 05/25/2021 08:20   ? ?Telemetry  ?  ?RSR, 80's to 90's, rare PVCs - Personally Reviewed ? ?Cardiac Studies  ? ?2D echo 05/15/2021: ? 1. Left ventricular ejection fraction, by estimation, is 40 to 45%. The  ?left ventricle has mildly decreased function. The left ventricle  ?demonstrates global hypokinesis. There is mild left ventricular  ?hypertrophy. Left ventricular diastolic parameters  ?are consistent with Grade II diastolic dysfunction (pseudonormalization).  ? 2. Right ventricular systolic function is moderately reduced. The right  ?ventricular size is mildly enlarged.  ? 3. Left atrial size was moderately dilated.  ? 4. Right atrial size was moderately dilated.  ? 5. The mitral valve is normal in structure. Mild mitral valve  ?regurgitation. No evidence of mitral stenosis.  ? 6. Tricuspid valve regurgitation is moderate to severe.  ? 7. The aortic valve is normal in structure. Aortic valve regurgitation is  ?not visualized. No aortic stenosis is present.  ? 8. The inferior vena cava is normal in size with greater than 50%  ?respiratory variability, suggesting right atrial pressure of 3 mmHg. ?__________ ?  ?R/LHC 05/08/2020: ?Prox RCA lesion is 30% stenosed. ?Mid RCA lesion is 30% stenosed. ?Ost LAD lesion is 30% stenosed. ?Prox LAD lesion is 30% stenosed. ?  ?1. Mildly elevated filling pressures.  ?2. Mild pulmonary venous hypertension.  ?3. Nonobstructive CAD.   ?  ?Nonischemic cardiomyopathy.  ?__________ ?  ?2D echo 04/08/2020: ?1. Left ventricular ejection fraction, by estimation, is 40 to 45%. The  ?left ventricle has mildly decreased function. The left ventricle  ?demonstrates global hypokinesis. There is severe concentric left   ?ventricular hypertrophy. Left ventricular diastolic  ? parameters are consistent with Grade II diastolic dysfunction  ?(pseudonormalization). Elevated left atrial pressure.  ? 2.  Right ventricular systolic function was not well visualized. The right  ?ventricular size is mildly enlarged. There is normal pulmonary artery  ?systolic pressure.  ? 3. Left atrial size was severely dilated.  ? 4. Right atrial size was severely dilated.  ? 5. The mitral valve is normal in structure. Trivial mitral valve  ?regurgitation. No evidence of mitral stenosis.  ? 6. The aortic valve is normal in structure. Aortic valve regurgitation is  ?not visualized. No aortic stenosis is present.  ? 7. The inferior vena cava is dilated in size with >50% respiratory  ?variability, suggesting right atrial pressure of 8 mmHg. ?_____________  ? ?Patient Profile  ?   ?46 y.o. 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 ? ?Assessment & Plan  ?  ?1.  Acute hypercarbic respiratory failure secondary to possible healthcare acquired pneumonia: Initially required mechanical ventilation, remain on O2 via [place].  Desat to 80% off of O2 yesterday.  Inhalers per medicine team.  Cont diuresis. ? ?2.  Acute on chronic combined syst/diast CHF:  EF 40-45% w/ sev concentric LVH, grade 2 diastolic dysfunction, and global hypokinesis.  Off of all meds for at least a month prior to admission.  Cont to respond well to diuresis  Minus 1L yesterday and minus 26L since admission.  Wt hasn't moved much in the past 3 days - stable @ 121 kg this AM.  Creat stable @ 1.0.  BUN/Bicarb cont to rise slowly - 27 and 35 respectively.  Abd remains quite firm w/ trace amount of flank edema.  No lower ext edema.  Cont ? blocker, ARB, IV lasix.  Will add spiro.  No insurance and  difficulty affording meds previously.  Currently on farxiga - will need pt assistance.  Discussed meds w/ CHF nurse navigator.  If we can assure that he can get meds via med assistance/CHF clinic, it'd be wort

## 2021-05-27 NOTE — Progress Notes (Signed)
Speech Language Pathology Treatment: Dysphagia  ?Patient Details ?Name: Gregory Mckenzie ?MRN: [id number] ?DOB: 1975/08/23 ?Today's Date: 05/27/2021 ?Time: 2035-5974 ?SLP Time Calculation (min) (ACUTE ONLY): 45 min ? ?Assessment / Plan / Recommendation ?Clinical Impression ? Pt seen for ongoing assessment of swallowing. He is alert, verbally responsive and engaged in conversation w/ SLP and Wife -- improved speech intelligibility today. pt is on [place] O2 support 2L; wbc wnl. Dentition status is poor w/ missing teeth. Suspect this could impact both speech intelligibility and mastication of solid foods. ?Pt explained general aspiration precautions and agreed verbally to the need for following them especially sitting upright for all oral intake -- pt was sitting EOB for this session. He fed himself the Lunch meal of 1 puree and cooked, soft solids (chicken tenders) and thin liquids via cup. NO overt clinical s/s of aspiration were noted w/ any consistency; respiratory status remained calm and unlabored, vocal quality clear b/t trials. Pt was also given a Pill Whole w/ thin liquid (w/ nsg present) which he tolerated well(assessment of upgrade to using liquids vs puree when Pill swallowing now as desired). Oral phase impairments previously noted, including prolonged mastication and delayed A-P transfer, appeared less prominent at this session. Increased efficiency for mastication and overall oral clearance noted today; though pt still consumed the puree textured food w/ most ease. Min verbal cues provided for use of alternating foods/liquids and using a liquid wash to clear any oral residue b/f taking another bite. Pt followed instructions adequately w/ gentle cues. Wife given same instruction and agreed.  ?NSG denied any deficits in swallowing w/ oral diet; pill swallowing. ?  ?Pt appears at reduced risk for aspriation when following general aspiration precautions and monitoring oral clearing. Recommend a Regular/mech soft diet  for ease of soft, cooked foods w/ gravies added to moisten foods; Thin liquids. Recommend general aspiration precautions; Pills w/ thin liquid vs Whole in Puree if desired; tray setup and positioning assistance for meals as needed d/t generalized weakness w/ acute illness. REFLUX precautions. Education given on choosing easy to eat foods; food consistencies. ST services will sign off at this time w/ MD to reconsult if new needs while admitted. NSG updated. Precautions posted at bedside. Pt agreed. ? ? ? ? ?  ?HPI HPI: pt is a 46 y/o male with h/o CHF, HTN, CKD III, PAF, DM, morbid obesity, medication non-compliance and marijuna use.  He presents to the ER for evaluation of a 1 week history of scrotal swelling associated with shortness of breath and lower extremity swelling.  He has a history of CHF and admits to being noncompliant with prescribed medications.  According to him his medications are supposed to be shipped to his house but he has not received this in 2 months. Pt currently on 2L nasal canula, with O2 saturations ranging from 92-96 during session. ?  ?   ?SLP Plan ? All goals met ? ?  ?  ?Recommendations for follow up therapy are one component of a multi-disciplinary discharge planning process, led by the attending physician.  Recommendations may be updated based on patient status, additional functional criteria and insurance authorization. ?  ? ?Recommendations  ?Diet recommendations: Dysphagia 3 (mechanical soft);Thin liquid ?Liquids provided via: Cup;Straw (monitor) ?Medication Administration: Whole meds with liquid (vs need for puree if desired for ease of swallowing) ?Supervision: Patient able to self feed;Intermittent supervision to cue for compensatory strategies (while in hospital) ?Compensations: Minimize environmental distractions;Slow rate;Small sips/bites;Lingual sweep for clearance of pocketing;Follow solids with  liquid ?Postural Changes and/or Swallow Maneuvers: Out of bed for meals;Seated  upright 90 degrees;Upright 30-60 min after meal  ?   ?    ?   ? ? ? ? General recommendations:  (Dietician f/u) ?Oral Care Recommendations: Oral care BID;Oral care before and after PO;Patient independent with oral care (setup support) ?Follow Up Recommendations: No SLP follow up ?Assistance recommended at discharge: Intermittent Supervision/Assistance ?SLP Visit Diagnosis: Dysphagia, oral phase (R13.11) ?Plan: All goals met ? ? ? ? ?  ?  ? ? ? ? ?Gregory Kenner, MS, CCC-SLP ?Speech Language Pathologist ?[facility]; [place] ?[phone] (ascom) ?Gregory Mckenzie ? ?05/27/2021, 4:50 PM ?

## 2021-05-27 NOTE — Assessment & Plan Note (Signed)
Body mass index is 43.06 kg/m?[name] ?Complicates overall care and prognosis.  Recommend lifestyle modifications including physical activity and diet for weight loss and overall long-term health. ? ?

## 2021-05-27 NOTE — Assessment & Plan Note (Signed)
Patient is currently in sinus rhythm ?Continue Xarelto and beta-blocker ?

## 2021-05-27 NOTE — Assessment & Plan Note (Signed)
This is secondary to acute on chronic congestive heart failure ?Now on 2 liters O2, continue weaning efforts, patient doesn't use O2 at home. ?TOC working to get CPAP/BiPAP and/or oxygen at discharge ? ?

## 2021-05-28 ENCOUNTER — Other Ambulatory Visit: Payer: Self-pay

## 2021-05-28 DIAGNOSIS — I509 Heart failure, unspecified: Secondary | ICD-10-CM

## 2021-05-28 DIAGNOSIS — R0902 Hypoxemia: Secondary | ICD-10-CM

## 2021-05-28 DIAGNOSIS — R0600 Dyspnea, unspecified: Secondary | ICD-10-CM

## 2021-05-28 LAB — BASIC METABOLIC PANEL
Anion gap: 8 (ref 5–15)
BUN: 28 mg/dL — ABNORMAL HIGH (ref 6–20)
CO2: 32 mmol/L (ref 22–32)
Calcium: 9.5 mg/dL (ref 8.9–10.3)
Chloride: 97 mmol/L — ABNORMAL LOW (ref 98–111)
Creatinine, Ser: 1.02 mg/dL (ref 0.61–1.24)
GFR, Estimated: >60 mL/min (ref >60)
Glucose, Bld: 112 mg/dL — ABNORMAL HIGH (ref 70–99)
Potassium: 4.2 mmol/L (ref 3.5–5.1)
Sodium: 137 mmol/L (ref 135–145)

## 2021-05-28 LAB — CBC
HCT: 56.9 % — ABNORMAL HIGH (ref 39.0–52.0)
Hemoglobin: 17.3 g/dL — ABNORMAL HIGH (ref 13.0–17.0)
MCH: 28.8 pg (ref 26.0–34.0)
MCHC: 30.4 g/dL (ref 30.0–36.0)
MCV: 94.7 fL (ref 80.0–100.0)
Platelets: 216 10*3/uL (ref 150–400)
RBC: 6.01 MIL/uL — ABNORMAL HIGH (ref 4.22–5.81)
RDW: 14.6 % (ref 11.5–15.5)
WBC: 6.8 10*3/uL (ref 4.0–10.5)
nRBC: 0 % (ref 0.0–0.2)

## 2021-05-28 LAB — GLUCOSE, CAPILLARY
Glucose-Capillary: 119 mg/dL — ABNORMAL HIGH (ref 70–99)
Glucose-Capillary: 125 mg/dL — ABNORMAL HIGH (ref 70–99)
Glucose-Capillary: 94 mg/dL (ref 70–99)

## 2021-05-28 MED ORDER — SPIRONOLACTONE 25 MG PO TABS
25.0000 mg | ORAL_TABLET | Freq: Every day | ORAL | 0 refills | Status: DC
  Filled 2021-05-28: qty 30, 30d supply, fill #0

## 2021-05-28 MED ORDER — FUROSEMIDE 40 MG PO TABS
40.0000 mg | ORAL_TABLET | Freq: Every day | ORAL | 0 refills | Status: DC
  Filled 2021-05-28: qty 30, 30d supply, fill #0

## 2021-05-28 MED ORDER — ATORVASTATIN CALCIUM 40 MG PO TABS
40.0000 mg | ORAL_TABLET | Freq: Every day | ORAL | 0 refills | Status: DC
Start: 2021-05-28 — End: 2021-06-25
  Filled 2021-05-28: qty 30, 30d supply, fill #0

## 2021-05-28 MED ORDER — RIVAROXABAN 20 MG PO TABS
20.0000 mg | ORAL_TABLET | Freq: Every day | ORAL | 0 refills | Status: DC
  Filled 2021-05-28: qty 30, 30d supply, fill #0

## 2021-05-28 MED ORDER — TORSEMIDE 20 MG PO TABS
40.0000 mg | ORAL_TABLET | Freq: Every day | ORAL | 0 refills | Status: DC
  Filled 2021-05-28: qty 60, 30d supply, fill #0

## 2021-05-28 MED ORDER — CARVEDILOL 6.25 MG PO TABS
6.2500 mg | ORAL_TABLET | Freq: Two times a day (BID) | ORAL | 0 refills | Status: DC
  Filled 2021-05-28: qty 60, 30d supply, fill #0

## 2021-05-28 MED ORDER — DAPAGLIFLOZIN PROPANEDIOL 10 MG PO TABS
10.0000 mg | ORAL_TABLET | Freq: Every day | ORAL | 0 refills | Status: DC
  Filled 2021-05-28 (×2): qty 30, 30d supply, fill #0

## 2021-05-28 MED ORDER — LOSARTAN POTASSIUM 25 MG PO TABS
25.0000 mg | ORAL_TABLET | Freq: Every day | ORAL | 0 refills | Status: DC
  Filled 2021-05-28: qty 30, 30d supply, fill #0

## 2021-05-28 NOTE — Progress Notes (Signed)
? ?  Heart Failure Nurse Navigator Note ? ?Met with patient today, he states he is being discharged home.  He is sitting up at the bedside in no acute distress.  No family members present at this time. ? ? ?By teach back method went over daily weights, what to report, eating low-sodium diet, changes in symptoms and fluid restriction.  He needed very little reinforcement. ? ?He states he also has plans for increasing his physical activity at home. ? ?He had no further questions. ? ?Pricilla Riffle RN CHFN ?

## 2021-05-28 NOTE — Progress Notes (Signed)
? ?Cardiology Progress Note  ? ?Patient Name: Gregory Mckenzie ?Date of Encounter: 05/28/2021 ? ?Primary Cardiologist: prev followed by Greig Castilla ? ?Subjective  ? ?Feels well this morning.  No chest pain or dyspnea.  Notes that he ambulated last night without any difficulty.  Still wearing oxygen at 2 L/min.  Has been set up for BiPAP at home.  Eager to go home. ? ?Inpatient Medications  ?  ?Scheduled Meds: ? atorvastatin  40 mg Oral QHS  ? carvedilol  6.25 mg Oral BID WC  ? chlorhexidine gluconate (MEDLINE KIT)  15 mL Mouth Rinse BID  ? Chlorhexidine Gluconate Cloth  6 each Topical Q0600  ? dapagliflozin propanediol  10 mg Oral Daily  ? feeding supplement  237 mL Oral TID BM  ? furosemide  40 mg Oral Daily  ? insulin aspart  0-20 Units Subcutaneous Q4H  ? losartan  25 mg Oral Daily  ? multivitamin with minerals  1 tablet Oral Daily  ? rivaroxaban  20 mg Oral Q supper  ? sodium chloride  2 spray Each Nare TID  ? sodium chloride flush  3 mL Intravenous Q12H  ? spironolactone  25 mg Oral Daily  ? ?Continuous Infusions: ? sodium chloride    ? sodium chloride Stopped (05/25/21 0847)  ? ?PRN Meds: ?sodium chloride, acetaminophen **OR** acetaminophen, albuterol, ondansetron **OR** ondansetron (ZOFRAN) IV, polyvinyl alcohol, sodium chloride flush  ? ?Vital Signs  ?  ?Vitals:  ? 05/28/21 0007 05/28/21 0359 05/28/21 0445 05/28/21 0830  ?BP: 119/69 127/64  135/65  ?Pulse: 86 89  87  ?Resp: 18 18  18   ?Temp: 98.6 ?F (37 ?C) 98.5 ?F (36.9 ?C)  98.7 ?F (37.1 ?C)  ?TempSrc: Oral Oral    ?SpO2: 100% 99%  94%  ?Weight:   121.8 kg   ?Height:      ? ? ?Intake/Output Summary (Last 24 hours) at 05/28/2021 1010 ?Last data filed at 05/27/2021 2149 ?Gross per 24 hour  ?Intake 980 ml  ?Output 600 ml  ?Net 380 ml  ? ?Filed Weights  ? 05/26/21 0500 05/27/21 0538 05/28/21 0445  ?Weight: 120.8 kg 121 kg 121.8 kg  ? ? ?Physical Exam  ? ?GEN: morbidly obese, in no acute distress.  ?HEENT: Grossly normal.  ?Neck: Supple, obese, difficult to  gauge JVP.  No bruits or masses.   ?Cardiac: RRR, no murmurs, rubs, or gallops. No clubbing, cyanosis, edema.  Radials 2+, DP/PT 2+ and equal bilaterally.  ?Respiratory:  Respirations regular and unlabored, clear to auscultation bilaterally. ?GI: Obese, softer than on prior days.  Nontender, BS + x 4. ?MS: no deformity or atrophy. ?Skin: warm and dry, no rash. ?Neuro:  Strength and sensation are intact. ?Psych: AAOx3.  Normal affect. ? ?Labs  ?  ?Chemistry ?Recent Labs  ?Lab 05/26/21 ?0301 05/27/21 ?9179 05/28/21 ?1505  ?NA 139 139 137  ?K 4.7 4.3 4.2  ?CL 98 99 97*  ?CO2 34* 35* 32  ?GLUCOSE 98 116* 112*  ?BUN 24* 27* 28*  ?CREATININE 1.16 1.00 1.02  ?CALCIUM 9.9 9.6 9.5  ?GFRNONAA >60 >60 >60  ?ANIONGAP 7 5 8   ?  ? ?Hematology ?Recent Labs  ?Lab 05/26/21 ?0301 05/27/21 ?6979 05/28/21 ?4801  ?WBC 6.1 6.8 6.8  ?RBC 6.11* 6.13* 6.01*  ?HGB 17.4* 17.4* 17.3*  ?HCT 58.7* 58.5* 56.9*  ?MCV 96.1 95.4 94.7  ?MCH 28.5 28.4 28.8  ?MCHC 29.6* 29.7* 30.4  ?RDW 15.5 15.4 14.6  ?PLT 197 209 216  ? ? ?  Cardiac Enzymes  ?Recent Labs  ?Lab 05/14/21 ?1149 05/14/21 ?1440 05/14/21 ?2031  ?TROPONINIHS 55* 67* 55*  ?   ? ?BNP ?   ?Component Value Date/Time  ? BNP 380.5 (H) 05/21/2021 0521  ? ?Lipids  ?Lab Results  ?Component Value Date  ? CHOL 139 04/07/2020  ? HDL 35 (L) 04/07/2020  ? [place] 93 04/07/2020  ? TRIG 45 05/23/2021  ? CHOLHDL 4.0 04/07/2020  ? ? ?HbA1c  ?Lab Results  ?Component Value Date  ? HGBA1C 6.6 (H) 05/14/2021  ? ? ?Radiology  ?  ?DG Chest Port 1 View ? ?Result Date: 05/25/2021 ?CLINICAL DATA:  Hypoxia EXAM: PORTABLE CHEST 1 VIEW COMPARISON:  Chest x-ray 05/21/2021 FINDINGS: Interval extubation. Heart size and mediastinum appear stable. Pulmonary vasculature within normal limits. Minor likely subsegmental atelectasis at the lung bases. No new consolidation identified. No pleural effusion or pneumothorax. IMPRESSION: Interval extubation. Minor subsegmental atelectasis at the lung bases. Electronically Signed   By:  Ofilia Neas M.D.   On: 05/25/2021 08:20   ? ?Telemetry  ?  ?Sinus rhythm- Personally Reviewed ? ?Cardiac Studies  ? ?2D echo 05/15/2021: ? 1. Left ventricular ejection fraction, by estimation, is 40 to 45%. The  ?left ventricle has mildly decreased function. The left ventricle  ?demonstrates global hypokinesis. There is mild left ventricular  ?hypertrophy. Left ventricular diastolic parameters  ?are consistent with Grade II diastolic dysfunction (pseudonormalization).  ? 2. Right ventricular systolic function is moderately reduced. The right  ?ventricular size is mildly enlarged.  ? 3. Left atrial size was moderately dilated.  ? 4. Right atrial size was moderately dilated.  ? 5. The mitral valve is normal in structure. Mild mitral valve  ?regurgitation. No evidence of mitral stenosis.  ? 6. Tricuspid valve regurgitation is moderate to severe.  ? 7. The aortic valve is normal in structure. Aortic valve regurgitation is  ?not visualized. No aortic stenosis is present.  ? 8. The inferior vena cava is normal in size with greater than 50%  ?respiratory variability, suggesting right atrial pressure of 3 mmHg. ?__________ ?  ?R/LHC 05/08/2020: ?Prox RCA lesion is 30% stenosed. ?Mid RCA lesion is 30% stenosed. ?Ost LAD lesion is 30% stenosed. ?Prox LAD lesion is 30% stenosed. ?  ?1. Mildly elevated filling pressures.  ?2. Mild pulmonary venous hypertension.  ?3. Nonobstructive CAD.   ?  ?Nonischemic cardiomyopathy.  ?__________ ?  ?2D echo 04/08/2020: ?1. Left ventricular ejection fraction, by estimation, is 40 to 45%. The  ?left ventricle has mildly decreased function. The left ventricle  ?demonstrates global hypokinesis. There is severe concentric left  ?ventricular hypertrophy. Left ventricular diastolic  ? parameters are consistent with Grade II diastolic dysfunction  ?(pseudonormalization). Elevated left atrial pressure.  ? 2. Right ventricular systolic function was not well visualized. The right  ?ventricular size  is mildly enlarged. There is normal pulmonary artery  ?systolic pressure.  ? 3. Left atrial size was severely dilated.  ? 4. Right atrial size was severely dilated.  ? 5. The mitral valve is normal in structure. Trivial mitral valve  ?regurgitation. No evidence of mitral stenosis.  ? 6. The aortic valve is normal in structure. Aortic valve regurgitation is  ?not visualized. No aortic stenosis is present.  ? 7. The inferior vena cava is dilated in size with >50% respiratory  ?variability, suggesting right atrial pressure of 8 mmHg. ?_____________  ? ?Patient Profile  ?   ?46 y.o. male with history of chronic combined systolic and diastolic CHF secondary  to NICM, paroxysmal atrial flutter, DM2, HTN, HLD, CKD stage IIIa, obesity, suspected sleep apnea, possible OHS, and tobacco use who was admitted 3/31 w/ resp failure and CHF. ? ?Assessment & Plan  ?  ?1.  Acute hypercarbic respiratory failure secondary to possible healthcare acquired pneumonia: Initially required mechanical ventilation.  Remains on O2 at 2 L/min and will be set up for home oxygen and BiPAP per medicine team. ? ?2.  Acute on chronic combined systolic diastolic congestive heart failure: EF 40 to 45% with severe concentric LVH, grade 2 diastolic dysfunction, and global hypokinesis.  He was off of all of his medications at home for at least a month prior to admission.  He has responded well to IV diuresis and is -26 L for admission.  Body habitus continues to make exam challenging though he does not appear to be overtly volume overloaded today.  His BUN is coming up slightly at 28, while creatinine remains stable at 1.02.  Patient is hoping to go home today.  We will transition him to torsemide 40 mg daily and otherwise recommend continuation of current doses of beta-blocker, ARB, spironolactone, and Farxiga.  If arrangements can be made to ensure that he will be able to obtain and afford his medicines, we would have a low threshold to transition his  losartan to Entresto.  Follow-up in heart failure clinic next week as planned.  Stressed the importance of compliance. ? ?3.  Paroxysmal atrial flutter: Isolated episode during admission in February 2022.  [address]

## 2021-05-30 LAB — BLOOD GAS, ARTERIAL
Acid-Base Excess: 16.2 mmol/L — ABNORMAL HIGH (ref 0.0–2.0)
Bicarbonate: 51.3 mmol/L — ABNORMAL HIGH (ref 20.0–28.0)
Delivery systems: POSITIVE
Expiratory PAP: 6 cmH2O
FIO2: 40 %
Inspiratory PAP: 16 cmH2O
O2 Saturation: 95.2 %
Patient temperature: 37
pCO2 arterial: 117 mmHg (ref 32–48)
pH, Arterial: 7.25 — ABNORMAL LOW (ref 7.35–7.45)
pO2, Arterial: 75 mmHg — ABNORMAL LOW (ref 83–108)

## 2021-05-30 NOTE — Discharge Summary (Signed)
?Physician Discharge Summary ?  ?Patient: Gregory Mckenzie MRN: [id number] DOB: 08/12/1975  ?Admit date:     05/14/2021  ?Discharge date: 05/28/2021  ?Discharge Physician: Max Sane  ? ?PCP: Patient, No Pcp Per (Inactive)  ? ?Recommendations at discharge:  ? ? F/up with outpt providers as requested ? ?Discharge Diagnoses: ?Principal Problem: ?  Acute combined systolic and diastolic heart failure ([place]) ?Active Problems: ?  Acute respiratory failure with hypoxia ([place]) ?  Dyspnea ?  Hypertension ?  Type 2 diabetes mellitus with complication, without long-term current use of insulin ([place]) ?  Class 3 severe obesity due to excess calories with serious comorbidity and body mass index (BMI) of 45.0 to 49.9 in adult [facility]) ?  Paroxysmal atrial flutter ([place]) ?  Elevated troponin ?  Non compliance w medication regimen ?  AKI (acute kidney injury) ([place]) ?  Hypoxia ? ?Hospital Course: ?46 y.o. male  with history of chronic systolic and diastolic heart failure, HTN, atrial flutter on xarelto and BB, DM2, severe obesity and suspected OSA/possible OHS, ex 20 py smoker who was admitted on 05/14/2021 for acute on chronic hypercapnic respiratory failure in setting of acute on chronic CHF.  Required transfer to ICU on 4/5 on BiPAP and subsequently required intubation and mechanical ventilation. ? ?Significant Events: (taken from ICU notes): ?05/14/21 admitted for acute on chronic systolic and diastolic heart failure, diuresing ?05/19/21: transferred to ICU on NIV trial. Intubated.  ?05/20/21: diuresed well on Lasix gtt and Diamox ?05/21/21: therapeutic phlebotomy performed; remains on vent ?05/22/21: remains on mechanical ventilation ?05/23/21: extubated ? ?TRH assumed care again on 4/11. ? ?Patient still requiring BiPAP often, but stable respiratory status.  Physically deconditioned.  Had failed swallow, SLP following for progress as he continues to improve.  ? ?4/12 - on 3 liters now. Transfer out to PCU ?4/13-PT, OT recommends [place] [facility],  OT ? ?Assessment and Plan: ?* Acute combined systolic and diastolic heart failure ([place]) ?Echocardiogram performed on 05/15/2021 showed ejection fraction 40 to [id number], grade 2 diastolic dysfunction. ?Diuresed with Net IO Since Admission: -26,793.27 mL [05/27/21 1635] ?-Continue Coreg, cardio planning to transition from losartan to [facility] as an outpt. continue Farxiga, PO Lasix 40 daily ? ?Acute respiratory failure with hypoxia ([place]) ?This is secondary to acute on chronic congestive heart failure ?Now on 2 liters O2, continue weaning efforts, patient doesn't use O2 at home. ?TOC provided CPAP and 2 l O2 oxygen at discharge ? ?AKI (acute kidney injury) ([place]) ?Resolved. ? ?Non compliance w medication regimen ?On admission, pt reported not taking his medicine for the last 48-month, due to difficulty to access medical care.   ?Send all medication to med management at time of discharge. ? ?Elevated troponin ?Due to demand ischemia in setting of acute on chronic congestive heart failure ? ?Paroxysmal atrial flutter ([place]) ?Patient is currently in sinus rhythm ?Continue Xarelto and beta-blocker ? ?Class 3 severe obesity due to excess calories with serious comorbidity and body mass index (BMI) of 45.0 to 49.9 in adult [facility]) ?Body mass index is 43.06 kg/m?[name] ?Complicates overall care and prognosis.  Recommend lifestyle modifications including physical activity and diet for weight loss and overall long-term health. ? ?Type 2 diabetes mellitus with complication, without long-term current use of insulin ([place]) ?A1c 6.6 ? ?Hypertension ?Continue Coreg, Losartan. Also on diuretics. ? ? ? ? ?  ? ? ?Consultants: Cardio ?Disposition: Home health ?Diet recommendation:  ?Discharge Diet Orders (From admission, onward)  ? ?  Start     Ordered  ?  05/28/21 0000  Diet - low sodium heart healthy       ? 05/28/21 1008  ? ?  ?  ? ?  ? ?Carb modified diet ?DISCHARGE MEDICATION: ?Allergies as of 05/28/2021   ?No Known Allergies ?  ? ?  ?Medication List   ?  ? ?STOP taking these medications   ? ?furosemide 20 MG tablet ?Commonly known as: LASIX ?  ?sacubitril-valsartan 49-51 MG ?Commonly known as: ENTRESTO ?  ? ?  ? ?TAKE these medications   ? ?atorvastatin 40 MG tablet ?Commonly known as: LIPITOR ?Take 1 tablet (40 mg total) by mouth once nightly at bedtime. ?What changed: Another medication with the same name was removed. Continue taking this medication, and follow the directions you see here. ?  ?carvedilol 6.25 MG tablet ?Commonly known as: COREG ?Take 1 tablet (6.25 mg total) by mouth 2 (two) times daily with a meal. ?What changed:  ?medication strength ?how much to take ?when to take this ?Another medication with the same name was removed. Continue taking this medication, and follow the directions you see here. ?  ?Farxiga 10 MG Tabs tablet ?Generic drug: dapagliflozin propanediol ?Take 1 tablet (10 mg total) by mouth once daily. ?What changed: when to take this ?  ?losartan 25 MG tablet ?Commonly known as: COZAAR ?Take 1 tablet (25 mg total) by mouth once daily. ?  ?spironolactone 25 MG tablet ?Commonly known as: ALDACTONE ?Take 1 tablet (25 mg total) by mouth once daily. ?What changed: how much to take ?  ?torsemide 20 MG tablet ?Commonly known as: DEMADEX ?Take 2 tablets (40 mg total) by mouth once daily. ?  ?Xarelto 20 MG Tabs tablet ?Generic drug: rivaroxaban ?Take 1 tablet (20 mg total) by mouth once daily with supper. ?What changed: Another medication with the same name was removed. Continue taking this medication, and follow the directions you see here. ?  ? ?  ? ? Follow-up Information   ? ? Theora Gianotti, NP. Schedule an appointment as soon as possible for a visit in 1 week(s).   ?Specialties: Nurse Practitioner, Cardiology, Radiology ?Why: [facility] Discharge F/UP .  appointment on ?Contact information: ?[address] ?[address] ?[place] [address] ?[phone] ? ? ?  ?  ? ? Rise Mu, PA-C .   ?Specialties: Physician  Assistant, Cardiology, Radiology ?Contact information: ?[address] ?[address] ?[place] [address] ?[phone] ? ? ?  ?  ? ?  ?  ? ?  ? ?Discharge Exam: ?Filed Weights  ? 05/26/21 0500 05/27/21 0538 05/28/21 0445  ?Weight: 120.8 kg 121 kg 121.8 kg  ? ?General exam: awake, alert, no acute distress, morbidly obese ?HEENT: wearing bipap, hearing grossly normal  ?Respiratory system: CTAB diminished due to body habitus, normal respiratory effort, on BiPAP. ?Cardiovascular system: normal S1/S2,  RRR, trace LE edema.   ?Gastrointestinal system: soft, NT ?Central nervous system: no gross focal neurologic deficits ?Extremities: moves all, normal tone ?Skin: dry, intact, normal temperature ?Psychiatry: normal mood, congruent affect ? ?Condition at discharge: good ? ?The results of significant diagnostics from this hospitalization (including imaging, microbiology, ancillary and laboratory) are listed below for reference.  ? ?Imaging Studies: ?DG Chest 2 View ? ?Result Date: 05/14/2021 ?CLINICAL DATA:  Shortness of breath EXAM: CHEST - 2 VIEW COMPARISON:  Chest radiograph dated April 07, 2020 FINDINGS: The heart is enlarged. Prominence of the central pulmonary vessels. Mild hazy opacities of the bilateral lower lobes concerning for mild edema. No appreciable  pleural effusion. IMPRESSION: 1.  Stable cardiomegaly. 2. Pulmonary vascular congestion with pulmonary mild edema. No appreciable pleural effusion. Electronically Signed   By: Keane Police D.O.   On: 05/14/2021 12:28  ? ?DG Abd 1 View ? ?Result Date: 05/21/2021 ?CLINICAL DATA:  Abdominal radiograph dated May 20, 2021 EXAM: ABDOMEN - 1 VIEW COMPARISON:  Abdominal radiograph dated May 20, 2021. FINDINGS: Interval advancement of OG tube with tip and side port projecting over the expected area of the stomach. Visualized bowel gas pattern is normal. No radio-opaque calculi or other significant radiographic abnormality are seen. Partially visualized lung bases  demonstrate cardiomegaly bibasilar atelectasis. IMPRESSION: Interval advancement of OG tube with tip and side port projecting over the expected area of the stomach. Electronically Signed   By: Yetta Glassman M.D.   On: 04/07/2

## 2021-05-31 ENCOUNTER — Telehealth: Payer: Self-pay

## 2021-05-31 NOTE — Telephone Encounter (Signed)
? ?  [facility] Discharge Phone  Call ? ?Patient was discharged on the 14th to home. ? ?He states that he continues to weigh himself every day and that he is down 3 pounds since he has been home.  Feels that he is feeling pretty good.  Is sticking with the 64 ounce fluid restriction. ? ?Went over his medication as he is taking as directed.  All his medications were refilled. ? ?He states with his CPAP that he did not get a mask.  Told him that I would follow-up with the social worker, he states that he had tried calling the company and could not get through. ? ?He has his first appointment in the outpatient heart failure clinic on Thursday, April 20 at 9:30 in the morning. ? ?Tresa Endo RN CHFN ?

## 2021-06-02 NOTE — Progress Notes (Signed)
? ?Cardiology Office Note   ? ?Date:  06/07/2021  ? ?ID:  [name], DOB [birth year], MRN [id number] ? ?PCP:  Patient, No Pcp Per (Inactive)  ?Cardiologist:  Nelva Bush, MD  ?Electrophysiologist:  None  ? ?Chief Complaint: Hospital follow up ? ?History of Present Illness:  ? ?Gregory Mckenzie is a 46 y.o. male with history of chronic combined systolic and diastolic CHF secondary to NICM, paroxysmal atrial flutter, DM2, HTN, HLD, CKD stage IIIa, obesity, suspected sleep apnea, possible OHS, and tobacco use who presents for hospital follow-up as outlined below. ? ?He was admitted to the hospital in 03/2020 with volume overload and uncontrolled hypertension in the 180s over 100s.  Echo during that admission demonstrated an EF of 40 to 45%, global hypokinesis, severe concentric LVH, grade 2 diastolic dysfunction, normal PASP, severe biatrial enlargement, trivial mitral regurgitation, and an estimated right atrial pressure of 8 mmHg.  Lexiscan MPI was undertaken during that admission which showed no evidence of ischemia with extensive wall motion abnormalities including apparent dyskinesia involving the basilar aspect of the inferior wall of the LV with an EF of 31%.  He was diuresed and started on GDMT with symptomatic improvement.  He was also noted to have a brief episode of paroxysmal atrial flutter and was initiated on Xarelto.  He converted to sinus rhythm during the admission.  He was subsequently evaluated in our transitions of care clinic in 04/2020 and scheduled for a diagnostic R/LHC in 04/2020, which demonstrated nonobstructive CAD and mildly elevated filling pressures with mild pulmonary venous hypertension.  He was lost to follow-up until readmission in 04/2021 with acute on chronic combined CHF.  High-sensitivity troponin peaking at 67.  BNP 990.  He was diuresed 26 L with subsequent improvement in symptoms and AKI leading to the holding of diuresis and GDMT.  With diuresis, BNP improved to 380.  Echo  demonstrated a stable cardiomyopathy with an EF of 40 to 45%, global hypokinesis, mild LVH, grade 2 diastolic dysfunction, moderately reduced RV systolic function with mildly enlarged RV cavity size, moderate biatrial enlargement, mild mitral regurgitation, moderate to severe tricuspid regurgitation, and an estimated right atrial pressure of 3 mmHg.  Hospital course was subsequently complicated by acute respiratory distress requiring mechanical ventilation with concern for HAP with improvement following antibiotic therapy. ? ?He comes in feeling very well from a cardiac perspective.  He is without symptoms of angina, dyspnea, palpitations, dizziness, presyncope, or syncope.  No significant lower extremity swelling.  His home weight following hospital discharge was 267 pounds with a home weight of 262 pounds today.  He is no longer eating fast foods and watching his salt intake.  He is drinking less than 2 L of liquids daily.  He reports adherence and tolerance to all medications.  He has longstanding stable two-pillow orthopnea.  No early satiety.  No falls, hematochezia, or melena.  Overall, he is pleased with his progress and does not have any active issues or concerns at this time.  He indicates he will be getting his medications through medication management and has paperwork to drop off for them. ? ? ?Labs independently reviewed: ?05/2021 - HGB 17.3, PLT 216, BUN 28, SCr 1.02, potassium 4.2, magnesium 2.3, albumin 3.2, AST/ALT normal, TSH normal ?04/2021 - A1c 6.6 ?03/2020 - TC 139, TG 57, HDL 35, LDL 93 ? ?Past Medical History:  ?Diagnosis Date  ? Atrial flutter ([place])   ? Chronic combined systolic and diastolic CHF (congestive heart failure) ([place])   ?  Hyperlipidemia   ? Hypertension   ? Obesity   ? ? ?Past Surgical History:  ?Procedure Laterality Date  ? RIGHT/LEFT HEART CATH AND CORONARY ANGIOGRAPHY N/A 05/08/2020  ? Procedure: RIGHT/LEFT HEART CATH AND CORONARY ANGIOGRAPHY;  Surgeon: Laurey Morale, MD;   Location: [facility] INVASIVE CV LAB;  Service: Cardiovascular;  Laterality: N/A;  ? ? ?Current Medications: ?Current Meds  ?Medication Sig  ? atorvastatin (LIPITOR) 40 MG tablet Take 1 tablet (40 mg total) by mouth once nightly at bedtime.  ? carvedilol (COREG) 6.25 MG tablet Take 1 tablet (6.25 mg total) by mouth 2 (two) times daily with a meal.  ? dapagliflozin propanediol (FARXIGA) 10 MG TABS tablet Take 1 tablet (10 mg total) by mouth once daily.  ? rivaroxaban (XARELTO) 20 MG TABS tablet Take 1 tablet (20 mg total) by mouth once daily with supper.  ? sacubitril-valsartan (ENTRESTO) 24-26 MG Take 1 tablet by mouth 2 (two) times daily.  ? spironolactone (ALDACTONE) 25 MG tablet Take 1 tablet (25 mg total) by mouth once daily.  ? torsemide (DEMADEX) 20 MG tablet Take 2 tablets (40 mg total) by mouth once daily.  ? [DISCONTINUED] losartan (COZAAR) 25 MG tablet Take 1 tablet (25 mg total) by mouth once daily.  ? ? ?Allergies:   Patient has no known allergies.  ? ?Social History  ? ?Socioeconomic History  ? Marital status: Single  ?  Spouse name: Not on file  ? Number of children: Not on file  ? Years of education: Not on file  ? Highest education level: Not on file  ?Occupational History  ? Not on file  ?Tobacco Use  ? Smoking status: Former  ?  Packs/day: 1.00  ?  Years: 27.00  ?  Pack years: 27.00  ?  Types: Cigarettes  ?  Quit date: 04/06/2020  ?  Years since quitting: 1.1  ? Smokeless tobacco: Never  ?Vaping Use  ? Vaping Use: Never used  ?Substance and Sexual Activity  ? Alcohol use: Not Currently  ? Drug use: Yes  ?  Frequency: 7.0 times per week  ?  Types: Marijuana  ?  Comment: daily  ? Sexual activity: Yes  ?  Partners: Female  ?  Birth control/protection: None  ?Other Topics Concern  ? Not on file  ?Social History Narrative  ? Not on file  ? ?Social Determinants of Health  ? ?Financial Resource Strain: Not on file  ?Food Insecurity: Not on file  ?Transportation Needs: Not on file  ?Physical Activity: Not on file   ?Stress: Not on file  ?Social Connections: Not on file  ?  ? ?Family History:  ?The patient's family history includes Hypertension in his brother and father; Pulmonary embolism in his brother. ? ?ROS:   ?12 point review of system is negative unless otherwise noted in the HPI. ? ? ?EKGs/Labs/Other Studies Reviewed:   ? ?Studies reviewed were summarized above. The additional studies were reviewed today: ? ?2D echo 05/15/2021: ? 1. Left ventricular ejection fraction, by estimation, is 40 to 45%. The  ?left ventricle has mildly decreased function. The left ventricle  ?demonstrates global hypokinesis. There is mild left ventricular  ?hypertrophy. Left ventricular diastolic parameters  ?are consistent with Grade II diastolic dysfunction (pseudonormalization).  ? 2. Right ventricular systolic function is moderately reduced. The right  ?ventricular size is mildly enlarged.  ? 3. Left atrial size was moderately dilated.  ? 4. Right atrial size was moderately dilated.  ? 5. The mitral valve is  normal in structure. Mild mitral valve  ?regurgitation. No evidence of mitral stenosis.  ? 6. Tricuspid valve regurgitation is moderate to severe.  ? 7. The aortic valve is normal in structure. Aortic valve regurgitation is  ?not visualized. No aortic stenosis is present.  ? 8. The inferior vena cava is normal in size with greater than 50%  ?respiratory variability, suggesting right atrial pressure of 3 mmHg. ?__________ ?  ?R/LHC 05/08/2020: ?Prox RCA lesion is 30% stenosed. ?Mid RCA lesion is 30% stenosed. ?Ost LAD lesion is 30% stenosed. ?Prox LAD lesion is 30% stenosed. ?  ?1. Mildly elevated filling pressures.  ?2. Mild pulmonary venous hypertension.  ?3. Nonobstructive CAD.   ?  ?Nonischemic cardiomyopathy.  ?__________ ?  ?2D echo 04/08/2020: ?1. Left ventricular ejection fraction, by estimation, is 40 to 45%. The  ?left ventricle has mildly decreased function. The left ventricle  ?demonstrates global hypokinesis. There is severe  concentric left  ?ventricular hypertrophy. Left ventricular diastolic  ? parameters are consistent with Grade II diastolic dysfunction  ?(pseudonormalization). Elevated left atrial pressure.  ? 2. Right ventricu

## 2021-06-03 ENCOUNTER — Ambulatory Visit: Payer: Self-pay | Admitting: Family

## 2021-06-07 ENCOUNTER — Other Ambulatory Visit: Payer: Self-pay

## 2021-06-07 ENCOUNTER — Ambulatory Visit (INDEPENDENT_AMBULATORY_CARE_PROVIDER_SITE_OTHER): Payer: Self-pay | Admitting: Physician Assistant

## 2021-06-07 ENCOUNTER — Encounter: Payer: Self-pay | Admitting: Physician Assistant

## 2021-06-07 VITALS — BP 130/70 | HR 95 | Ht 66.0 in | Wt 262.8 lb

## 2021-06-07 DIAGNOSIS — N1831 Chronic kidney disease, stage 3a: Secondary | ICD-10-CM

## 2021-06-07 DIAGNOSIS — E782 Mixed hyperlipidemia: Secondary | ICD-10-CM

## 2021-06-07 DIAGNOSIS — I4892 Unspecified atrial flutter: Secondary | ICD-10-CM

## 2021-06-07 DIAGNOSIS — I10 Essential (primary) hypertension: Secondary | ICD-10-CM

## 2021-06-07 DIAGNOSIS — I5042 Chronic combined systolic (congestive) and diastolic (congestive) heart failure: Secondary | ICD-10-CM

## 2021-06-07 DIAGNOSIS — I2510 Atherosclerotic heart disease of native coronary artery without angina pectoris: Secondary | ICD-10-CM

## 2021-06-07 MED ORDER — SACUBITRIL-VALSARTAN 24-26 MG PO TABS
1.0000 | ORAL_TABLET | Freq: Two times a day (BID) | ORAL | 11 refills | Status: DC
  Filled 2021-06-07: qty 60, 30d supply, fill #0
  Filled 2021-07-28: qty 60, 30d supply, fill #1
  Filled 2021-07-28: qty 60, 30d supply, fill #0
  Filled 2021-09-06 – 2021-10-21 (×2): qty 60, 30d supply, fill #1

## 2021-06-07 NOTE — Patient Instructions (Signed)
Medication Instructions:  ?Your physician has recommended you make the following change in your medication:  ? ?STOP Losartan ?START Entresto 24/26 mg twice a day ? ?Medication Samples have been provided to the patient. ? ?Drug name: Gregory Mckenzie        ?Strength: 24/26 mg         ?Qty: 2 bottles   ?LOT: [id number]   ?Exp.Date: APR 2025 ? ? ?*If you need a refill on your cardiac medications before your next appointment, please call your pharmacy* ? ? ?Lab Work: ?BMET today ? ?If you have labs (blood work) drawn today and your tests are completely normal, you will receive your results only by: ?MyChart Message (if you have MyChart) OR ?A paper copy in the mail ?If you have any lab test that is abnormal or we need to change your treatment, we will call you to review the results. ? ? ?Testing/Procedures: ?None ? ? ?Follow-Up: ?At [facility], you and your health needs are our priority.  As part of our continuing mission to provide you with exceptional heart care, we have created designated Provider Care Teams.  These Care Teams include your primary Cardiologist (physician) and Advanced Practice Providers (APPs -  Physician Assistants and Nurse Practitioners) who all work together to provide you with the care you need, when you need it. ? ?We recommend signing up for the patient portal called "MyChart".  Sign up information is provided on this After Visit Summary.  MyChart is used to connect with patients for Virtual Visits (Telemedicine).  Patients are able to view lab/test results, encounter notes, upcoming appointments, etc.  Non-urgent messages can be sent to your provider as well.   ?To learn more about what you can do with MyChart, go to [email or web address].   ? ?Your next appointment:   ?1 month(s) ? ?The format for your next appointment:   ?In Person ? ?Provider:   ?Nelva Bush, MD or Christell Faith, PA-C  ? ? ?Important Information About Sugar ? ? ? ? ?  ?

## 2021-06-09 LAB — BASIC METABOLIC PANEL
BUN/Creatinine Ratio: 18 (ref 9–20)
BUN: 28 mg/dL — ABNORMAL HIGH (ref 6–24)
CO2: 22 mmol/L (ref 20–29)
Calcium: 10.3 mg/dL — ABNORMAL HIGH (ref 8.7–10.2)
Chloride: 97 mmol/L (ref 96–106)
Creatinine, Ser: 1.6 mg/dL — ABNORMAL HIGH (ref 0.76–1.27)
Glucose: 100 mg/dL — ABNORMAL HIGH (ref 70–99)
Potassium: 4.4 mmol/L (ref 3.5–5.2)
Sodium: 142 mmol/L (ref 134–144)
eGFR: 53 mL/min/{1.73_m2} — ABNORMAL LOW (ref >59)

## 2021-06-10 ENCOUNTER — Telehealth: Payer: Self-pay | Admitting: *Deleted

## 2021-06-10 DIAGNOSIS — I4892 Unspecified atrial flutter: Secondary | ICD-10-CM

## 2021-06-10 DIAGNOSIS — I5042 Chronic combined systolic (congestive) and diastolic (congestive) heart failure: Secondary | ICD-10-CM

## 2021-06-10 NOTE — Telephone Encounter (Signed)
-----   Message from Rise Mu, PA-C sent at 06/09/2021  7:24 AM EDT ----- ?Random glucose okay ?Renal function is mildly elevated ?Calcium mildly elevated ?Potassium at goal ? ?Recommendations: ?Decrease torsemide to 20 mg daily ?Recheck BMP in 1 week, if calcium remains elevated at that time he will need to follow-up with his PCP ?

## 2021-06-10 NOTE — Telephone Encounter (Signed)
Left voicemail message to call back for review of results and recommendations.  

## 2021-06-11 MED ORDER — TORSEMIDE 20 MG PO TABS: 20.0000 mg | ORAL_TABLET | Freq: Every day | ORAL | 0 refills | Status: DC

## 2021-06-11 NOTE — Telephone Encounter (Signed)
Pt returning nurses call regarding test results. Please advise 

## 2021-06-11 NOTE — Telephone Encounter (Signed)
Reviewed results and recommendations with patient. We discussed decrease in torsemide 20 mg once a day and repeat labs in one week over at [facility]. He verbalized understanding with no further questions at this time.  ?

## 2021-06-14 NOTE — Progress Notes (Signed)
? Patient ID: Gregory Mckenzie, male    DOB: September 07, 1975, 46 y.o.   MRN: [id number] ? ?HPI ? ?Gregory Mckenzie is a 46 y/o male with a history of DM, hyperlipidemia, HTN, atrial flutter, obesity, previous tobacco use and chronic heart failure.  ? ?Echo report from 05/15/21 reviewed and showed an EF of 40-45% along with mild LVH, moderate LAE, mild Gregory & moderate/severe TR.  ? ?LHC done 05/08/20 showed: ?Prox RCA lesion is 30% stenosed. ?Mid RCA lesion is 30% stenosed. ?Ost LAD lesion is 30% stenosed. ?Prox LAD lesion is 30% stenosed. ?  ?1. Mildly elevated filling pressures.  ?2. Mild pulmonary venous hypertension.  ?3. Nonobstructive CAD.   ?Nonischemic cardiomyopathy.  ? ?Admitted 05/14/21 due to acute on chronic hypercapnic respiratory failure in setting of acute on chronic CHF.  Required transfer to ICU on 4/5 on BiPAP and subsequently required intubation and mechanical ventilation. Cardiology & pulmonology consults obtained. Placed on lasix gtt and diamox. Extubated on 4/9 and placed on bipap and then transitioned to 2L. Elevated troponin thought to be due to demand ischemia. Speech, PT and OT consults obtained. Elevated troponin thought to be due to demand ischemia. Discharged after 14 days.  ? ?He presents today with a chief complaint of an initial visit. He says that he has no complaints and specifically denies any difficulty sleeping, dizziness, abdominal distention, palpitations, pedal edema, chest pain, shortness of breath, cough, fatigue or weight gain.  ? ?Is weighing daily and no longer adds salt to his food or cooks with it. Has been reading food labels for sodium content and is no longer eating out. Walking 2 miles roundtrip every other day and denies any difficulty with doing this. Keeps his daily fluid intake to ~ 2L. Quit smoking the day he was admitted.  ? 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 ? ?Past Medical History:  ?Diagnosis Date  ? Atrial flutter ([place])   ? Chronic combined systolic and diastolic CHF (congestive heart failure) ([place])   ? Diabetes mellitus without complication ([place])   ? Hyperlipidemia   ? Hypertension   ? Obesity   ? ?Past Surgical History:  ?Procedure Laterality Date  ? RIGHT/LEFT HEART CATH AND CORONARY ANGIOGRAPHY N/A 05/08/2020  ? Procedure: RIGHT/LEFT HEART CATH AND CORONARY ANGIOGRAPHY;  Surgeon: Larey Dresser, MD;  Location: [facility];  Service: Cardiovascular;  Laterality: N/A;  ? ?Family History  ?Problem Relation Age of Onset  ? Hypertension Father   ? Hypertension Brother   ? Pulmonary embolism Brother   ? ?Social History  ? ?Tobacco Use  ? Smoking status: Former  ?  Packs/day: 1.00  ?  Years: 27.00  ?  Pack years: 27.00  ?  Types: Cigarettes  ?  Quit date: 04/06/2020  ?  Years since quitting: 1.1  ? Smokeless tobacco: Never  ?Substance Use Topics  ? Alcohol use: Not Currently  ? ?No Known Allergies ?Prior to Admission medications   ?Medication Sig Start Date End Date Taking? Authorizing Provider  ?atorvastatin (LIPITOR) 40 MG tablet Take 1 tablet (40 mg total) by mouth once nightly at bedtime. 05/28/21 06/27/21 Yes Max Sane, MD  ?carvedilol (COREG) 6.25 MG tablet Take 1 tablet (6.25 mg total) by mouth 2 (two) times daily with a  meal. 05/28/21 06/27/21 Yes Max Sane, MD  ?dapagliflozin propanediol (FARXIGA) 10 MG TABS tablet Take 1 tablet (10 mg total) by mouth once daily. 05/29/21 06/28/21 Yes Max Sane, MD  ?rivaroxaban (XARELTO) 20 MG TABS tablet Take 1 tablet (20 mg total) by mouth once daily with supper. 05/28/21  Yes Max Sane, MD  ?sacubitril-valsartan (ENTRESTO) 24-26 MG Take 1 tablet by mouth 2 (two) times daily. 06/07/21  Yes Dunn, Areta Haber, PA-C  ?spironolactone (ALDACTONE) 25 MG tablet Take 1 tablet (25 mg total) by mouth once daily. 05/29/21 06/28/21 Yes Max Sane, MD   ?torsemide (DEMADEX) 20 MG tablet Take 1 tablet (20 mg total) by mouth daily. 06/11/21 07/11/21 Yes Dunn, Areta Haber, PA-C  ? ?Review of Systems  ?Constitutional:  Negative for appetite change and fatigue.  ?HENT:  Negative for congestion, postnasal drip and sore throat.   ?Eyes: Negative.   ?Respiratory:  Negative for cough and shortness of breath.   ?Cardiovascular:  Negative for chest pain, palpitations and leg swelling.  ?Gastrointestinal:  Negative for abdominal distention and abdominal pain.  ?Endocrine: Negative.   ?Genitourinary: Negative.   ?Musculoskeletal:  Negative for back pain and neck pain.  ?Skin: Negative.   ?Allergic/Immunologic: Negative.   ?Neurological:  Negative for dizziness and light-headedness.  ?Hematological:  Negative for adenopathy. Does not bruise/bleed easily.  ?Psychiatric/Behavioral:  Negative for dysphoric mood and sleep disturbance (wearing CPAP nightly; sleeping on 2 pillows). The patient is not nervous/anxious.   ? ?Vitals:  ? 06/15/21 1210  ?BP: 108/76  ?Pulse: 89  ?Resp: 16  ?SpO2: 94%  ?Weight: 266 lb 6 oz (120.8 kg)  ?Height: 5\' 6"  (1.676 m)  ? ?Wt Readings from Last 3 Encounters:  ?06/15/21 266 lb 6 oz (120.8 kg)  ?06/07/21 262 lb 12.8 oz (119.2 kg)  ?05/28/21 268 lb 8.3 oz (121.8 kg)  ? ?Lab Results  ?Component Value Date  ? CREATININE 1.60 (H) 06/07/2021  ? CREATININE 1.02 05/28/2021  ? CREATININE 1.00 05/27/2021  ? ?Physical Exam ?Vitals and nursing note reviewed.  ?Constitutional:   ?   Appearance: Normal appearance.  ?HENT:  ?   Head: Normocephalic and atraumatic.  ?Cardiovascular:  ?   Rate and Rhythm: Normal rate and regular rhythm.  ?Pulmonary:  ?   Effort: Pulmonary effort is normal. No respiratory distress.  ?   Breath sounds: No wheezing or rales.  ?Abdominal:  ?   General: There is no distension.  ?   Palpations: Abdomen is soft.  ?   Tenderness: There is no abdominal tenderness.  ?Musculoskeletal:     ?   General: No tenderness.  ?   Cervical back: Normal range of  motion and neck supple.  ?   Right lower leg: No edema.  ?   Left lower leg: No edema.  ?Skin: ?   General: Skin is warm and dry.  ?Neurological:  ?   General: No focal deficit present.  ?   Mental Status: He is alert and oriented to person, place, and time.  ?Psychiatric:     ?   Mood and Affect: Mood normal.     ?   Behavior: Behavior normal.     ?   Thought Content: Thought content normal.  ? ?Assessment & Plan: ? ?1: Chronic heart failure with mildly reduced ejection fraction- ?- NYHA class I ?- euvolemic today ?- weighing daily and understands to call for an overnight weight gain of > 2 pounds or a weekly weight gain of > 5  pounds ?- not adding salt to his food and is reading food labels for sodium content to keep daily intake to 2000mg ; says that he used to eat out often and is no longer eating out and is now cooking everything at home ?- keeping daily fluid intake to ~ 2L ?- walking 2 miles roundtrip every other day and doing so without any difficulty ?- on GDMT of carvedilol, farxiga, entresto and spironolactone ?- BP may limit titration of GDMT although could use torsemide as PRN if he continues to do so well which may then allow for titration ?- has not smoked since his admission  ?- has made numerous lifestyle changes as his lengthy admission scared him and he wants to do everything he can to not go back to the hospital ?- BNP 05/21/21 was 380.5 ? ?2: HTN- ?- BP looks good (108/76) ?- does not have PCP so this was scheduled with [facility] on 08/31/21 ?- BMP 06/07/21 reviewed and showed sodium 142, potassium 4.4, creatinine 1.6 and GFR 53 ? ?3: DM- ?- A1c 05/14/21 was 6.6% ?- currently diet controlled ? ?4: Paroxysmal atrial flutter- ?- saw cardiology (Dunn) 06/07/21; returns 07/09/21 ?- order for labs was placed by cardiology so he was instructed to go to the [facility] to get these labs done after leaving our office today ?- currently on carvedilol and xarelto ? ? ?Medication bottles  reviewed.  ? ?Return in 6 weeks, sooner if needed.  ? ? ? ?

## 2021-06-15 ENCOUNTER — Other Ambulatory Visit
Admission: RE | Admit: 2021-06-15 | Discharge: 2021-06-15 | Disposition: A | Discharge status: Home / Self Care | Payer: Self-pay | Attending: Physician Assistant | Admitting: Physician Assistant

## 2021-06-15 ENCOUNTER — Ambulatory Visit: Payer: Self-pay | Attending: Family | Admitting: Family

## 2021-06-15 ENCOUNTER — Encounter: Payer: Self-pay | Admitting: Family

## 2021-06-15 VITALS — BP 108/76 | HR 89 | Resp 16 | Ht 66.0 in | Wt 266.4 lb

## 2021-06-15 DIAGNOSIS — E785 Hyperlipidemia, unspecified: Secondary | ICD-10-CM | POA: Insufficient documentation

## 2021-06-15 DIAGNOSIS — I10 Essential (primary) hypertension: Secondary | ICD-10-CM

## 2021-06-15 DIAGNOSIS — I110 Hypertensive heart disease with heart failure: Secondary | ICD-10-CM | POA: Insufficient documentation

## 2021-06-15 DIAGNOSIS — Z87891 Personal history of nicotine dependence: Secondary | ICD-10-CM | POA: Insufficient documentation

## 2021-06-15 DIAGNOSIS — E119 Type 2 diabetes mellitus without complications: Secondary | ICD-10-CM | POA: Insufficient documentation

## 2021-06-15 DIAGNOSIS — I2510 Atherosclerotic heart disease of native coronary artery without angina pectoris: Secondary | ICD-10-CM | POA: Insufficient documentation

## 2021-06-15 DIAGNOSIS — Z7984 Long term (current) use of oral hypoglycemic drugs: Secondary | ICD-10-CM | POA: Insufficient documentation

## 2021-06-15 DIAGNOSIS — I428 Other cardiomyopathies: Secondary | ICD-10-CM | POA: Insufficient documentation

## 2021-06-15 DIAGNOSIS — I4892 Unspecified atrial flutter: Secondary | ICD-10-CM

## 2021-06-15 DIAGNOSIS — Z8249 Family history of ischemic heart disease and other diseases of the circulatory system: Secondary | ICD-10-CM | POA: Insufficient documentation

## 2021-06-15 DIAGNOSIS — I5042 Chronic combined systolic (congestive) and diastolic (congestive) heart failure: Secondary | ICD-10-CM

## 2021-06-15 DIAGNOSIS — Z7901 Long term (current) use of anticoagulants: Secondary | ICD-10-CM | POA: Insufficient documentation

## 2021-06-15 DIAGNOSIS — E669 Obesity, unspecified: Secondary | ICD-10-CM | POA: Insufficient documentation

## 2021-06-15 DIAGNOSIS — I5022 Chronic systolic (congestive) heart failure: Secondary | ICD-10-CM | POA: Insufficient documentation

## 2021-06-15 DIAGNOSIS — E1122 Type 2 diabetes mellitus with diabetic chronic kidney disease: Secondary | ICD-10-CM

## 2021-06-15 DIAGNOSIS — N1831 Chronic kidney disease, stage 3a: Secondary | ICD-10-CM

## 2021-06-15 LAB — BASIC METABOLIC PANEL
Anion gap: 9 (ref 5–15)
BUN: 23 mg/dL — ABNORMAL HIGH (ref 6–20)
CO2: 29 mmol/L (ref 22–32)
Calcium: 9.8 mg/dL (ref 8.9–10.3)
Chloride: 98 mmol/L (ref 98–111)
Creatinine, Ser: 1.46 mg/dL — ABNORMAL HIGH (ref 0.61–1.24)
GFR, Estimated: 59 mL/min — ABNORMAL LOW (ref >60)
Glucose, Bld: 114 mg/dL — ABNORMAL HIGH (ref 70–99)
Potassium: 4 mmol/L (ref 3.5–5.1)
Sodium: 136 mmol/L (ref 135–145)

## 2021-06-15 NOTE — Patient Instructions (Signed)
Continue weighing daily and call for an overnight weight gain of 3 pounds or more or a weekly weight gain of more than 5 pounds.   If you have voicemail, please make sure your mailbox is cleaned out so that we may leave a message and please make sure to listen to any voicemails.     

## 2021-06-25 ENCOUNTER — Other Ambulatory Visit: Payer: Self-pay

## 2021-06-25 ENCOUNTER — Other Ambulatory Visit: Payer: Self-pay | Admitting: Family

## 2021-06-25 MED ORDER — DAPAGLIFLOZIN PROPANEDIOL 10 MG PO TABS
10.0000 mg | ORAL_TABLET | Freq: Every day | ORAL | 3 refills | Status: DC
  Filled 2021-06-25: qty 90, 90d supply, fill #0

## 2021-06-25 MED ORDER — CARVEDILOL 6.25 MG PO TABS
6.2500 mg | ORAL_TABLET | Freq: Two times a day (BID) | ORAL | 3 refills | Status: DC
  Filled 2021-06-25: qty 60, 30d supply, fill #0
  Filled 2021-07-28: qty 60, 30d supply, fill #1
  Filled 2021-07-28 – 2021-07-30 (×2): qty 60, 30d supply, fill #0
  Filled 2021-09-06: qty 60, 30d supply, fill #1
  Filled 2021-10-21: qty 180, 90d supply, fill #2
  Filled 2022-01-24 – 2022-02-21 (×2): qty 180, 90d supply, fill #3
  Filled 2022-02-23: qty 60, 30d supply, fill #3
  Filled 2022-03-30: qty 180, 90d supply, fill #3

## 2021-06-25 MED ORDER — SPIRONOLACTONE 25 MG PO TABS
25.0000 mg | ORAL_TABLET | Freq: Every day | ORAL | 3 refills | Status: DC
  Filled 2021-06-25: qty 30, 30d supply, fill #0
  Filled 2021-07-28: qty 30, 30d supply, fill #1
  Filled 2021-07-28: qty 30, 30d supply, fill #0
  Filled 2021-09-06: qty 30, 30d supply, fill #1
  Filled 2021-10-21: qty 90, 90d supply, fill #2
  Filled 2022-01-24 – 2022-02-21 (×2): qty 90, 90d supply, fill #3
  Filled 2022-02-23: qty 30, 30d supply, fill #3
  Filled 2022-03-30: qty 90, 90d supply, fill #3

## 2021-06-25 MED ORDER — RIVAROXABAN 20 MG PO TABS
20.0000 mg | ORAL_TABLET | Freq: Every day | ORAL | 3 refills | Status: DC
  Filled 2021-06-25 – 2021-07-28 (×2): qty 30, 30d supply, fill #0
  Filled 2021-07-28: qty 30, 30d supply, fill #1
  Filled 2021-08-02: qty 30, 30d supply, fill #0
  Filled 2021-09-06 – 2021-10-21 (×4): qty 30, 30d supply, fill #1

## 2021-06-25 MED ORDER — TORSEMIDE 20 MG PO TABS
20.0000 mg | ORAL_TABLET | Freq: Every day | ORAL | 3 refills | Status: DC
  Filled 2021-06-25: qty 30, 30d supply, fill #0
  Filled 2021-07-28: qty 30, 30d supply, fill #1
  Filled 2021-07-28: qty 30, 30d supply, fill #0
  Filled 2021-09-06: qty 30, 30d supply, fill #1
  Filled 2021-10-21: qty 90, 90d supply, fill #2
  Filled 2022-01-24 – 2022-02-21 (×2): qty 90, 90d supply, fill #3
  Filled 2022-02-23: qty 30, 30d supply, fill #3
  Filled 2022-03-30: qty 90, 90d supply, fill #3

## 2021-06-25 MED ORDER — ATORVASTATIN CALCIUM 40 MG PO TABS
40.0000 mg | ORAL_TABLET | Freq: Every day | ORAL | 3 refills | Status: DC
  Filled 2021-06-25: qty 30, 30d supply, fill #0

## 2021-06-25 NOTE — Progress Notes (Signed)
All meds (except entresto) sent to [facility] ?

## 2021-07-09 ENCOUNTER — Ambulatory Visit: Payer: Self-pay | Admitting: Internal Medicine

## 2021-07-09 NOTE — Progress Notes (Deleted)
Follow-up Outpatient Visit Date: 07/09/2021  Primary Care Provider: Patient, No Pcp Per (Inactive) No address on file  Chief Complaint: ***  HPI:  Mr. Fenn is a 46 y.o. male with history of chronic HFrEF secondary to nonischemic cardiomyopathy, paroxysmal atrial flutter, hypertension, hyperlipidemia, type 2 diabetes mellitus, chronic kidney disease stage IIIa, obesity with suspected obstructive sleep apnea and obesity hypoventilation syndrome, and tobacco abuse, who presents for follow-up of heart failure.  I met him during a hospitalization at [facility] last month for acute on chronic respiratory failure and acute on chronic HFrEF in the setting of uncontrolled hypertension.  He was last seen in our office a month ago by Christell Faith, PA, at which time he was feeling well without chest pain, shortness of breath, and edema.  He was compliant with his medications as well as a low-sodium and fluid restricted diet.  He was switched from losartan to [facility] and maintained on his current doses of carvedilol, dapagliflozin, spironolactone, and torsemide.  Rivaroxaban was also continued in the setting of paroxysmal atrial flutter.  --------------------------------------------------------------------------------------------------  Past Medical History:  Diagnosis Date   Atrial flutter (HCC)    Chronic combined systolic and diastolic CHF (congestive heart failure) ([place])    Diabetes mellitus without complication ([place])    Hyperlipidemia    Hypertension    Obesity    Past Surgical History:  Procedure Laterality Date   RIGHT/LEFT HEART CATH AND CORONARY ANGIOGRAPHY N/A 05/08/2020   Procedure: RIGHT/LEFT HEART CATH AND CORONARY ANGIOGRAPHY;  Surgeon: Larey Dresser, MD;  Location: [facility];  Service: Cardiovascular;  Laterality: N/A;    No outpatient medications have been marked as taking for the 07/09/21 encounter (Appointment) with Clydell Alberts, Harrell Gave, MD.    Allergies: Patient has no known  allergies.  Social History   Tobacco Use   Smoking status: Former    Packs/day: 1.00    Years: 27.00    Pack years: 27.00    Types: Cigarettes    Quit date: 04/06/2020    Years since quitting: 1.2   Smokeless tobacco: Never  Vaping Use   Vaping Use: Never used  Substance Use Topics   Alcohol use: Not Currently   Drug use: Yes    Frequency: 7.0 times per week    Types: Marijuana    Comment: daily    Family History  Problem Relation Age of Onset   Hypertension Father    Hypertension Brother    Pulmonary embolism Brother     Review of Systems: A 12-system review of systems was performed and was negative except as noted in the HPI.  --------------------------------------------------------------------------------------------------  Physical Exam: There were no vitals taken for this visit.  General:  NAD. Neck: No JVD or HJR. Lungs: Clear to auscultation bilaterally without wheezes or crackles. Heart: Regular rate and rhythm without murmurs, rubs, or gallops. Abdomen: Soft, nontender, nondistended. Extremities: No lower extremity edema.  EKG:  ***  Lab Results  Component Value Date   WBC 6.8 05/28/2021   HGB 17.3 (H) 05/28/2021   HCT 56.9 (H) 05/28/2021   MCV 94.7 05/28/2021   PLT 216 05/28/2021    Lab Results  Component Value Date   NA 136 06/15/2021   K 4.0 06/15/2021   CL 98 06/15/2021   CO2 29 06/15/2021   BUN 23 (H) 06/15/2021   CREATININE 1.46 (H) 06/15/2021   GLUCOSE 114 (H) 06/15/2021   ALT 19 05/21/2021    Lab Results  Component Value Date   CHOL  139 04/07/2020   HDL 35 (L) 04/07/2020   LDLCALC 93 04/07/2020   TRIG 45 05/23/2021   CHOLHDL 4.0 04/07/2020    --------------------------------------------------------------------------------------------------  ASSESSMENT AND PLAN: Nelva Bush, MD 07/09/2021 7:54 AM

## 2021-07-16 ENCOUNTER — Other Ambulatory Visit
Admission: RE | Admit: 2021-07-16 | Discharge: 2021-07-16 | Disposition: A | Discharge status: Home / Self Care | Payer: Self-pay | Attending: Medical | Admitting: Medical

## 2021-07-16 ENCOUNTER — Ambulatory Visit (INDEPENDENT_AMBULATORY_CARE_PROVIDER_SITE_OTHER): Payer: Self-pay | Admitting: Medical

## 2021-07-16 ENCOUNTER — Encounter: Payer: Self-pay | Admitting: Medical

## 2021-07-16 ENCOUNTER — Other Ambulatory Visit: Payer: Self-pay

## 2021-07-16 VITALS — BP 110/80 | HR 98 | Ht 66.0 in | Wt 271.6 lb

## 2021-07-16 DIAGNOSIS — I429 Cardiomyopathy, unspecified: Secondary | ICD-10-CM

## 2021-07-16 DIAGNOSIS — I5022 Chronic systolic (congestive) heart failure: Secondary | ICD-10-CM

## 2021-07-16 DIAGNOSIS — I10 Essential (primary) hypertension: Secondary | ICD-10-CM | POA: Insufficient documentation

## 2021-07-16 DIAGNOSIS — E782 Mixed hyperlipidemia: Secondary | ICD-10-CM | POA: Insufficient documentation

## 2021-07-16 DIAGNOSIS — N1831 Chronic kidney disease, stage 3a: Secondary | ICD-10-CM

## 2021-07-16 DIAGNOSIS — I4892 Unspecified atrial flutter: Secondary | ICD-10-CM

## 2021-07-16 DIAGNOSIS — I2510 Atherosclerotic heart disease of native coronary artery without angina pectoris: Secondary | ICD-10-CM

## 2021-07-16 LAB — BASIC METABOLIC PANEL
Anion gap: 7 (ref 5–15)
BUN: 19 mg/dL (ref 6–20)
CO2: 30 mmol/L (ref 22–32)
Calcium: 9.9 mg/dL (ref 8.9–10.3)
Chloride: 100 mmol/L (ref 98–111)
Creatinine, Ser: 1.24 mg/dL (ref 0.61–1.24)
GFR, Estimated: >60 mL/min (ref >60)
Glucose, Bld: 138 mg/dL — ABNORMAL HIGH (ref 70–99)
Potassium: 4.1 mmol/L (ref 3.5–5.1)
Sodium: 137 mmol/L (ref 135–145)

## 2021-07-16 LAB — LDL CHOLESTEROL, DIRECT: Direct LDL: 91.5 mg/dL (ref 0–99)

## 2021-07-16 MED ORDER — DAPAGLIFLOZIN PROPANEDIOL 10 MG PO TABS
10.0000 mg | ORAL_TABLET | Freq: Every day | ORAL | 3 refills | Status: DC
  Filled 2021-07-16: qty 90, 90d supply, fill #0

## 2021-07-16 NOTE — Progress Notes (Signed)
Cardiology Office Note:    Date:  07/16/2021   ID:  Gregory Mckenzie, DOB [birth year], MRN [id number]  PCP:  Patient, No Pcp Per (Inactive)  [facility] Cardiologist:  Nelva Bush, MD  [place] Electrophysiologist:  None   Referring MD: No ref. provider found   Chief Complaint: 1 month follow-up  History of Present Illness:    Gregory Mckenzie is a 46 y.o. male with a hx of chronic combined systolic and diastolic CHF secondary to NICM, paroxysmal atrial flutter, DM2, HTN, HLD, CKD stage IIIa, obesity, suspected sleep apnea, possible OHS, and tobacco use who presents for hospital follow-up as outlined below.   He was admitted to the hospital in 03/2020 with volume overload and uncontrolled hypertension in the 180s over 100s.  Echo during that admission demonstrated an EF of 40 to 45%, global hypokinesis, severe concentric LVH, grade 2 diastolic dysfunction, normal PASP, severe biatrial enlargement, trivial mitral regurgitation, and an estimated right atrial pressure of 8 mmHg.  Lexiscan MPI was undertaken during that admission which showed no evidence of ischemia with extensive wall motion abnormalities including apparent dyskinesia involving the basilar aspect of the inferior wall of the LV with an EF of 31%.  He was diuresed and started on GDMT with symptomatic improvement.  He was also noted to have a brief episode of paroxysmal atrial flutter and was initiated on Xarelto.  He converted to sinus rhythm during the admission.  He was subsequently evaluated in our transitions of care clinic in 04/2020 and scheduled for a diagnostic R/LHC in 04/2020, which demonstrated nonobstructive CAD and mildly elevated filling pressures with mild pulmonary venous hypertension.  He was lost to follow-up until readmission in 04/2021 with acute on chronic combined CHF.  High-sensitivity troponin peaking at 67.  BNP 990.  He was diuresed 26 L with subsequent improvement in symptoms and AKI leading to the holding  of diuresis and GDMT.  With diuresis, BNP improved to 380.  Echo demonstrated a stable cardiomyopathy with an EF of 40 to 45%, global hypokinesis, mild LVH, grade 2 diastolic dysfunction, moderately reduced RV systolic function with mildly enlarged RV cavity size, moderate biatrial enlargement, mild mitral regurgitation, moderate to severe tricuspid regurgitation, and an estimated right atrial pressure of 3 mmHg.  Hospital course was subsequently complicated by acute respiratory distress requiring mechanical ventilation with concern for HAP with improvement following antibiotic therapy.  He was last seen 06/07/21 and was doing well from a cardiac perspective. He was started on Entresto for HFmrEF.   Today, the patient reports he is tolerating Entresto well. Follow-up labs were stable, although kdiney function was still abnormal. He denies chest pain or SOB. NO LLE, orthopnea or pnd. We can check Lipids today. BP is borderline soft, he denies chest pain or SOB.   Past Medical History:  Diagnosis Date   Atrial flutter (HCC)    Chronic combined systolic and diastolic CHF (congestive heart failure) ([place])    Diabetes mellitus without complication ([place])    Hyperlipidemia    Hypertension    Obesity     Past Surgical History:  Procedure Laterality Date   RIGHT/LEFT HEART CATH AND CORONARY ANGIOGRAPHY N/A 05/08/2020   Procedure: RIGHT/LEFT HEART CATH AND CORONARY ANGIOGRAPHY;  Surgeon: Larey Dresser, MD;  Location: [facility];  Service: Cardiovascular;  Laterality: N/A;    Current Medications: Current Meds  Medication Sig   atorvastatin (LIPITOR) 40 MG tablet Take 1 tablet (40 mg total) by mouth once nightly  at bedtime.   carvedilol (COREG) 6.25 MG tablet Take 1 tablet (6.25 mg total) by mouth 2 (two) times daily with a meal.   dapagliflozin propanediol (FARXIGA) 10 MG TABS tablet Take 1 tablet (10 mg total) by mouth once daily.   OXYGEN Inhale 4 L into the lungs at bedtime.    rivaroxaban (XARELTO) 20 MG TABS tablet Take 1 tablet (20 mg total) by mouth once daily with supper.   sacubitril-valsartan (ENTRESTO) 24-26 MG Take 1 tablet by mouth 2 (two) times daily.   spironolactone (ALDACTONE) 25 MG tablet Take 1 tablet (25 mg total) by mouth once daily.   torsemide (DEMADEX) 20 MG tablet Take 1 tablet (20 mg total) by mouth once daily.     Allergies:   Patient has no known allergies.   Social History   Socioeconomic History   Marital status: Single    Spouse name: Not on file   Number of children: Not on file   Years of education: Not on file   Highest education level: Not on file  Occupational History   Not on file  Tobacco Use   Smoking status: Former    Packs/day: 1.00    Years: 27.00    Pack years: 27.00    Types: Cigarettes    Quit date: 04/06/2020    Years since quitting: 1.2   Smokeless tobacco: Never  Vaping Use   Vaping Use: Never used  Substance and Sexual Activity   Alcohol use: Not Currently   Drug use: Yes    Frequency: 7.0 times per week    Types: Marijuana    Comment: daily   Sexual activity: Yes    Partners: Female    Birth control/protection: None  Other Topics Concern   Not on file  Social History Narrative   Not on file   Social Determinants of Health   Financial Resource Strain: Not on file  Food Insecurity: Not on file  Transportation Needs: Not on file  Physical Activity: Not on file  Stress: Not on file  Social Connections: Not on file     Family History: The patient's family history includes Hypertension in his brother and father; Pulmonary embolism in his brother.  ROS:   Please see the history of present illness.     All other systems reviewed and are negative.  EKGs/Labs/Other Studies Reviewed:    The following studies were reviewed today:  2D echo 05/15/2021:  1. Left ventricular ejection fraction, by estimation, is 40 to 45%. The  left ventricle has mildly decreased function. The left ventricle   demonstrates global hypokinesis. There is mild left ventricular  hypertrophy. Left ventricular diastolic parameters  are consistent with Grade II diastolic dysfunction (pseudonormalization).   2. Right ventricular systolic function is moderately reduced. The right  ventricular size is mildly enlarged.   3. Left atrial size was moderately dilated.   4. Right atrial size was moderately dilated.   5. The mitral valve is normal in structure. Mild mitral valve  regurgitation. No evidence of mitral stenosis.   6. Tricuspid valve regurgitation is moderate to severe.   7. The aortic valve is normal in structure. Aortic valve regurgitation is  not visualized. No aortic stenosis is present.   8. The inferior vena cava is normal in size with greater than 50%  respiratory variability, suggesting right atrial pressure of 3 mmHg. __________   [facility] 05/08/2020: Prox RCA lesion is 30% stenosed. Mid RCA lesion is 30% stenosed. Ost LAD lesion is 30%  stenosed. Prox LAD lesion is 30% stenosed.   1. Mildly elevated filling pressures.  2. Mild pulmonary venous hypertension.  3. Nonobstructive CAD.     Nonischemic cardiomyopathy.  __________   2D echo 04/08/2020: 1. Left ventricular ejection fraction, by estimation, is 40 to 45%. The  left ventricle has mildly decreased function. The left ventricle  demonstrates global hypokinesis. There is severe concentric left  ventricular hypertrophy. Left ventricular diastolic   parameters are consistent with Grade II diastolic dysfunction  (pseudonormalization). Elevated left atrial pressure.   2. Right ventricular systolic function was not well visualized. The right  ventricular size is mildly enlarged. There is normal pulmonary artery  systolic pressure.   3. Left atrial size was severely dilated.   4. Right atrial size was severely dilated.   5. The mitral valve is normal in structure. Trivial mitral valve  regurgitation. No evidence of mitral stenosis.    6. The aortic valve is normal in structure. Aortic valve regurgitation is  not visualized. No aortic stenosis is present.   7. The inferior vena cava is dilated in size with >50% respiratory  variability, suggesting right atrial pressure of 8 mmHg.  EKG:  EKG is  ordered today.  The ekg ordered today demonstrates NSR 98bpm, PRI 141ms, nonspecific ST/T wave changes  Recent Labs: 05/20/2021: TSH 0.758 05/21/2021: ALT 19; B Natriuretic Peptide 380.5 05/26/2021: Magnesium 2.3 05/28/2021: Hemoglobin 17.3; Platelets 216 06/15/2021: BUN 23; Creatinine, Ser 1.46; Potassium 4.0; Sodium 136  Recent Lipid Panel    Component Value Date/Time   CHOL 139 04/07/2020 2041   TRIG 45 05/23/2021 0606   HDL 35 (L) 04/07/2020 2041   CHOLHDL 4.0 04/07/2020 2041   VLDL 11 04/07/2020 2041   LDLCALC 93 04/07/2020 2041    Physical Exam:    VS:  BP 110/80 (BP Location: Left Arm, Patient Position: Sitting, Cuff Size: Large)   Pulse 98   Ht 5\' 6"  (1.676 m)   Wt 271 lb 9.6 oz (123.2 kg)   SpO2 92%   BMI 43.84 kg/m     Wt Readings from Last 3 Encounters:  07/16/21 271 lb 9.6 oz (123.2 kg)  06/15/21 266 lb 6 oz (120.8 kg)  06/07/21 262 lb 12.8 oz (119.2 kg)     GEN:  Well nourished, well developed in no acute distress HEENT: Normal NECK: No JVD; No carotid bruits LYMPHATICS: No lymphadenopathy CARDIAC: RRR, no murmurs, rubs, gallops RESPIRATORY:  Clear to auscultation without rales, wheezing or rhonchi  ABDOMEN: Soft, non-tender, non-distended MUSCULOSKELETAL:  No edema; No deformity  SKIN: Warm and dry NEUROLOGIC:  Alert and oriented x 3 PSYCHIATRIC:  Normal affect   ASSESSMENT:    1. Chronic systolic heart failure (HCC)   2. Paroxysmal atrial flutter ([place])   3. Coronary artery disease, non-occlusive   4. Essential hypertension   5. Hyperlipidemia, mixed   6. Stage 3a chronic kidney disease (HCC)   7. Cardiomyopathy, unspecified type ([place])    PLAN:    In order of problems listed  above:  Chronic systolic and diastolic CHF Repeat echo in April showed LVEF 40-45%, which was unchanged. He is on GDMT with Coreg, Lisabeth Register, and spironolactone. He takes Torsemide 20mg  daily, appears euvolemic on exam. Most recent kidney function was improved, but still elevated. I will re-check a BMET today, may not need Torsemide. I will repeat a limited echo in 1 month and see him back in 2 months.   Paroxysmal Aflutter EKG today showed NSR. Continue stroke  ppx with Xarelto. Continue rate control with Coreg 6.25mg BID.   Nonobstructive CAD Patient denies anginal symptoms. Continue medical management with Aspirin, statin, and BB therapy. No further ischemic work-up indicated.   HTN BP is borderline soft today. Continue current medications.   HLD Re-check direct LDL today. Continue Lipitor 40mg  daily.  CKD stage 3 Re-check BMET as above. Appears kidney function has been up the last few months.    Disposition: Follow up in 2 month(s) with APP     Signed, Colbe Viviano Ninfa Meeker, PA-C  07/16/2021 11:17 AM    [facility]

## 2021-07-16 NOTE — Patient Instructions (Signed)
Medication Instructions:  - Your physician recommends that you continue on your current medications as directed. Please refer to the Current Medication list given to you today.  *If you need a refill on your cardiac medications before your next appointment, please call your pharmacy*   Lab Work: - Your physician recommends that you have lab work today:  BMP/ [place] Entrance at [facility] 1st desk on the right to check in (REGISTRATION)  Lab hours: Monday- Friday (7:30 am- 5:30 pm)  If you have labs (blood work) drawn today and your tests are completely normal, you will receive your results only by: MyChart Message (if you have MyChart) OR A paper copy in the mail If you have any lab test that is abnormal or we need to change your treatment, we will call you to review the results.   Testing/Procedures:  1) Limited Echocardiogram: (in 1 month) - Your physician has requested that you have a limited echocardiogram. Echocardiography is a painless test that uses sound waves to create images of your heart. It provides your doctor with information about the size and shape of your heart and how well your heart's chambers and valves are working. This procedure takes approximately one hour. There are no restrictions for this procedure. There is a possibility that an IV may need to be started during your test to inject an image enhancing agent. This is done to obtain more optimal pictures of your heart. Therefore we ask that you do at least drink some water prior to coming in to hydrate your veins.     Follow-Up: At [facility], you and your health needs are our priority.  As part of our continuing mission to provide you with exceptional heart care, we have created designated Provider Care Teams.  These Care Teams include your primary Cardiologist (physician) and Advanced Practice Providers (APPs -  Physician Assistants and Nurse Practitioners) who all work together to provide you  with the care you need, when you need it.  We recommend signing up for the patient portal called "MyChart".  Sign up information is provided on this After Visit Summary.  MyChart is used to connect with patients for Virtual Visits (Telemedicine).  Patients are able to view lab/test results, encounter notes, upcoming appointments, etc.  Non-urgent messages can be sent to your provider as well.   To learn more about what you can do with MyChart, go to [email or web address].    Your next appointment:   2 month(s)  The format for your next appointment:   In Person  Provider:   You may see Nelva Bush, MD or one of the following Advanced Practice Providers on your designated Care Team:    Cadence Kathlen Mody, [place]    Other Instructions  Echocardiogram An echocardiogram is a test that uses sound waves (ultrasound) to produce images of the heart. Images from an echocardiogram can provide important information about: Heart size and shape. The size and thickness and movement of your heart's walls. Heart muscle function and strength. Heart valve function or if you have stenosis. Stenosis is when the heart valves are too narrow. If blood is flowing backward through the heart valves (regurgitation). A tumor or infectious growth around the heart valves. Areas of heart muscle that are not working well because of poor blood flow or injury from a heart attack. Aneurysm detection. An aneurysm is a weak or damaged part of an artery wall. The wall bulges out from the normal force of blood pumping  through the body. Tell a health care provider about: Any allergies you have. All medicines you are taking, including vitamins, herbs, eye drops, creams, and over-the-counter medicines. Any blood disorders you have. Any surgeries you have had. Any medical conditions you have. Whether you are pregnant or may be pregnant. What are the risks? Generally, this is a safe test. However, problems may occur,  including an allergic reaction to dye (contrast) that may be used during the test. What happens before the test? No specific preparation is needed. You may eat and drink normally. What happens during the test?  You will take off your clothes from the waist up and put on a hospital gown. Electrodes or electrocardiogram (ECG)patches may be placed on your chest. The electrodes or patches are then connected to a device that monitors your heart rate and rhythm. You will lie down on a table for an ultrasound exam. A gel will be applied to your chest to help sound waves pass through your skin. A handheld device, called a transducer, will be pressed against your chest and moved over your heart. The transducer produces sound waves that travel to your heart and bounce back (or "echo" back) to the transducer. These sound waves will be captured in real-time and changed into images of your heart that can be viewed on a video monitor. The images will be recorded on a computer and reviewed by your health care provider. You may be asked to change positions or hold your breath for a short time. This makes it easier to get different views or better views of your heart. In some cases, you may receive contrast through an IV in one of your veins. This can improve the quality of the pictures from your heart. The procedure may vary among health care providers and hospitals. What can I expect after the test? You may return to your normal, everyday life, including diet, activities, and medicines, unless your health care provider tells you not to do that. Follow these instructions at home: It is up to you to get the results of your test. Ask your health care provider, or the department that is doing the test, when your results will be ready. Keep all follow-up visits. This is important. Summary An echocardiogram is a test that uses sound waves (ultrasound) to produce images of the heart. Images from an echocardiogram can  provide important information about the size and shape of your heart, heart muscle function, heart valve function, and other possible heart problems. You do not need to do anything to prepare before this test. You may eat and drink normally. After the echocardiogram is completed, you may return to your normal, everyday life, unless your health care provider tells you not to do that. This information is not intended to replace advice given to you by your health care provider. Make sure you discuss any questions you have with your health care provider. Document Revised: 10/14/2020 Document Reviewed: 09/24/2019 Elsevier Patient Education  [place]

## 2021-07-20 ENCOUNTER — Other Ambulatory Visit: Payer: Self-pay

## 2021-07-20 ENCOUNTER — Telehealth: Payer: Self-pay

## 2021-07-20 MED ORDER — ATORVASTATIN CALCIUM 40 MG PO TABS
80.0000 mg | ORAL_TABLET | Freq: Every day | ORAL | 1 refills | Status: DC
  Filled 2021-07-20: qty 90, 45d supply, fill #0
  Filled 2021-09-06: qty 90, 45d supply, fill #1
  Filled 2021-10-21: qty 180, 90d supply, fill #2

## 2021-07-20 NOTE — Telephone Encounter (Signed)
-----   Message from Cadence David Stall, PA-C sent at 07/17/2021 11:31 AM EDT ----- Labs showed improved kidney function. LDL is still above goal, lets increase Lipitor t0 80mg  daily.

## 2021-07-20 NOTE — Telephone Encounter (Signed)
Called patient and informed him of the result note below. Patient verbalized understanding and agreed with plan.

## 2021-07-21 ENCOUNTER — Other Ambulatory Visit: Payer: Self-pay

## 2021-07-27 ENCOUNTER — Other Ambulatory Visit: Payer: Self-pay

## 2021-07-27 ENCOUNTER — Encounter: Payer: Self-pay | Admitting: Pharmacy Technician

## 2021-07-27 NOTE — Patient Outreach (Signed)
Patient only signed DOH Attestation.  Would need to provide current year's household income if PAP medications were needed. ? ?Navdeep Halt J. Daivon Rayos ?Patient Advocate Specialist ?[facility]  ?

## 2021-07-30 ENCOUNTER — Other Ambulatory Visit: Payer: Self-pay

## 2021-08-02 ENCOUNTER — Other Ambulatory Visit: Payer: Self-pay

## 2021-08-03 ENCOUNTER — Other Ambulatory Visit: Payer: Self-pay

## 2021-08-05 ENCOUNTER — Other Ambulatory Visit: Payer: Self-pay

## 2021-08-10 ENCOUNTER — Ambulatory Visit: Payer: Self-pay | Admitting: Family

## 2021-08-24 ENCOUNTER — Other Ambulatory Visit: Payer: Self-pay

## 2021-08-27 ENCOUNTER — Encounter: Payer: Self-pay | Admitting: Family

## 2021-08-27 ENCOUNTER — Ambulatory Visit: Payer: Commercial Managed Care - HMO | Attending: Family | Admitting: Family

## 2021-08-27 ENCOUNTER — Other Ambulatory Visit: Payer: Self-pay

## 2021-08-27 VITALS — BP 152/60 | HR 97 | Resp 20 | Ht 66.0 in | Wt 288.0 lb

## 2021-08-27 DIAGNOSIS — E785 Hyperlipidemia, unspecified: Secondary | ICD-10-CM | POA: Insufficient documentation

## 2021-08-27 DIAGNOSIS — I272 Pulmonary hypertension, unspecified: Secondary | ICD-10-CM | POA: Insufficient documentation

## 2021-08-27 DIAGNOSIS — I2510 Atherosclerotic heart disease of native coronary artery without angina pectoris: Secondary | ICD-10-CM | POA: Insufficient documentation

## 2021-08-27 DIAGNOSIS — I4892 Unspecified atrial flutter: Secondary | ICD-10-CM | POA: Diagnosis not present

## 2021-08-27 DIAGNOSIS — I10 Essential (primary) hypertension: Secondary | ICD-10-CM | POA: Diagnosis not present

## 2021-08-27 DIAGNOSIS — I428 Other cardiomyopathies: Secondary | ICD-10-CM | POA: Insufficient documentation

## 2021-08-27 DIAGNOSIS — E119 Type 2 diabetes mellitus without complications: Secondary | ICD-10-CM | POA: Insufficient documentation

## 2021-08-27 DIAGNOSIS — G4733 Obstructive sleep apnea (adult) (pediatric): Secondary | ICD-10-CM | POA: Insufficient documentation

## 2021-08-27 DIAGNOSIS — N1831 Chronic kidney disease, stage 3a: Secondary | ICD-10-CM

## 2021-08-27 DIAGNOSIS — E1122 Type 2 diabetes mellitus with diabetic chronic kidney disease: Secondary | ICD-10-CM | POA: Diagnosis not present

## 2021-08-27 DIAGNOSIS — E669 Obesity, unspecified: Secondary | ICD-10-CM | POA: Insufficient documentation

## 2021-08-27 DIAGNOSIS — I5022 Chronic systolic (congestive) heart failure: Secondary | ICD-10-CM | POA: Diagnosis not present

## 2021-08-27 DIAGNOSIS — Z87891 Personal history of nicotine dependence: Secondary | ICD-10-CM | POA: Insufficient documentation

## 2021-08-27 DIAGNOSIS — I110 Hypertensive heart disease with heart failure: Secondary | ICD-10-CM | POA: Insufficient documentation

## 2021-08-27 MED ORDER — DAPAGLIFLOZIN PROPANEDIOL 10 MG PO TABS
10.0000 mg | ORAL_TABLET | Freq: Every day | ORAL | 3 refills | Status: DC
  Filled 2021-08-27 – 2021-10-21 (×2): qty 90, 90d supply, fill #0
  Filled 2022-02-21 – 2022-03-30 (×2): qty 90, 90d supply, fill #1

## 2021-08-27 NOTE — Progress Notes (Signed)
Patient ID: Gregory Mckenzie, male    DOB: [birth year], 46 y.o.   MRN: [id number]  HPI  Gregory Mckenzie is a 46 y/o male with a history of DM, hyperlipidemia, HTN, atrial flutter, obesity, previous tobacco use and chronic heart failure.   Echo report from 05/15/21 reviewed and showed an EF of 40-45% along with mild LVH, moderate LAE, mild Gregory & moderate/severe TR.   LHC done 05/08/20 showed: Prox RCA lesion is 30% stenosed. Mid RCA lesion is 30% stenosed. Ost LAD lesion is 30% stenosed. Prox LAD lesion is 30% stenosed.   1. Mildly elevated filling pressures.  2. Mild pulmonary venous hypertension.  3. Nonobstructive CAD.   Nonischemic cardiomyopathy.   Admitted 05/14/21 due to acute on chronic hypercapnic respiratory failure in setting of acute on chronic CHF.  Required transfer to ICU on 4/5 on BiPAP and subsequently required intubation and mechanical ventilation. Cardiology & pulmonology consults obtained. Placed on lasix gtt and diamox. Extubated on 4/9 and placed on bipap and then transitioned to 2L. Elevated troponin thought to be due to demand ischemia. Speech, PT and OT consults obtained. Elevated troponin thought to be due to demand ischemia. Discharged after 14 days.   He presents today with a chief complaint of a follow-up visit. Currently voices no complaints and specifically denies any difficulty sleeping, dizziness, abdominal distention, palpitations, pedal edema, chest pain, shortness of breath, cough or fatigue.   Has been weighing daily and does endorse a gradual weight gain. Says that he's gotten out of the habit of exercising and had been trying to walk daily but then would wait until the evening and then something would come up and then he wouldn't walk. Also admits to eating late at night around 7pm and not making good food choices.   Has been out farxiga because he says that [facility] is still waiting on that company to mail it.   Past Medical History:  Diagnosis Date    Atrial flutter (HCC)    Chronic combined systolic and diastolic CHF (congestive heart failure) (HCC)    Diabetes mellitus without complication (HCC)    Hyperlipidemia    Hypertension    Obesity    Past Surgical History:  Procedure Laterality Date   RIGHT/LEFT HEART CATH AND CORONARY ANGIOGRAPHY N/A 05/08/2020   Procedure: RIGHT/LEFT HEART CATH AND CORONARY ANGIOGRAPHY;  Surgeon: Laurey Morale, MD;  Location[facility] INVASIVE CV LAB;  Service: Cardiovascular;  Laterality: N/A;   Family History  Problem Relation Age of Onset   Hypertension Father    Hypertension Brother    Pulmonary embolism Brother    Social History   Tobacco Use   Smoking status: Former    Packs/day: 1.00    Years: 27.00    Total pack years: 27.00    Types: Cigarettes    Quit date: 04/06/2020    Years since quitting: 1.3   Smokeless tobacco: Never  Substance Use Topics   Alcohol use: Not Currently   No Known Allergies  Prior to Admission medications   Medication Sig Start Date End Date Taking? Authorizing Provider  atorvastatin (LIPITOR) 40 MG tablet Take 2 tablets (80 mg total) by mouth at bedtime. 07/20/21  Yes Iran Ouch, MD  carvedilol (COREG) 6.25 MG tablet Take 1 tablet (6.25 mg total) by mouth 2 (two) times daily with a meal. 06/25/21  Yes Alinda Egolf A, FNP  OXYGEN Inhale 4 L into the lungs at bedtime.   Yes [provider]  rivaroxaban (  XARELTO) 20 MG TABS tablet Take 1 tablet (20 mg total) by mouth once daily with supper. 06/25/21  Yes Bradee Common A, FNP  sacubitril-valsartan (ENTRESTO) 24-26 MG Take 1 tablet by mouth 2 (two) times daily. 06/07/21  Yes Dunn, Raymon Mutton, PA-C  spironolactone (ALDACTONE) 25 MG tablet Take 1 tablet (25 mg total) by mouth once daily. 06/25/21  Yes Clarisa Kindred A, FNP  torsemide (DEMADEX) 20 MG tablet Take 1 tablet (20 mg total) by mouth once daily. 06/25/21  Yes Clarisa Kindred A, FNP  dapagliflozin propanediol (FARXIGA) 10 MG TABS tablet Take 1 tablet (10 mg  total) by mouth once daily. 08/27/21   Delma Freeze, FNP    Review of Systems  Constitutional:  Negative for appetite change and fatigue.  HENT:  Negative for congestion, postnasal drip and sore throat.   Eyes: Negative.   Respiratory:  Negative for cough and shortness of breath.   Cardiovascular:  Negative for chest pain, palpitations and leg swelling.  Gastrointestinal:  Negative for abdominal distention and abdominal pain.  Endocrine: Negative.   Genitourinary: Negative.   Musculoskeletal:  Negative for back pain and neck pain.  Skin: Negative.   Allergic/Immunologic: Negative.   Neurological:  Negative for dizziness and light-headedness.  Hematological:  Negative for adenopathy. Does not bruise/bleed easily.  Psychiatric/Behavioral:  Negative for dysphoric mood and sleep disturbance (wearing CPAP nightly; sleeping on 2 pillows). The patient is not nervous/anxious.    Vitals:   08/27/21 1056  BP: (!) 152/60  Pulse: 97  Resp: 20  SpO2: 98%  Weight: 288 lb (130.6 kg)  Height: 5\' 6"  (1.676 m)   Wt Readings from Last 3 Encounters:  08/27/21 288 lb (130.6 kg)  07/16/21 271 lb 9.6 oz (123.2 kg)  06/15/21 266 lb 6 oz (120.8 kg)   Lab Results  Component Value Date   CREATININE 1.24 07/16/2021   CREATININE 1.46 (H) 06/15/2021   CREATININE 1.60 (H) 06/07/2021   Physical Exam Vitals and nursing note reviewed.  Constitutional:      Appearance: Normal appearance.  HENT:     Head: Normocephalic and atraumatic.  Cardiovascular:     Rate and Rhythm: Normal rate and regular rhythm.  Pulmonary:     Effort: Pulmonary effort is normal. No respiratory distress.     Breath sounds: No wheezing or rales.  Abdominal:     General: There is no distension.     Palpations: Abdomen is soft.     Tenderness: There is no abdominal tenderness.  Musculoskeletal:        General: No tenderness.     Cervical back: Normal range of motion and neck supple.     Right lower leg: No edema.      Left lower leg: No edema.  Skin:    General: Skin is warm and dry.  Neurological:     General: No focal deficit present.     Mental Status: He is alert and oriented to person, place, and time.  Psychiatric:        Mood and Affect: Mood normal.        Behavior: Behavior normal.        Thought Content: Thought content normal.    Assessment & Plan:  1: Chronic heart failure with mildly reduced ejection fraction- - NYHA class I - euvolemic today - weighing daily and understands to call for an overnight weight gain of > 2 pounds or a weekly weight gain of > 5 pounds - weight up 22  pounds from last visit here 2 months ago; has gained 17 pounds since cardiology visit 6 weeks ago - not adding salt to his food and is reading food labels for sodium content; admits that he's gotten off his diet and has been eating out more often and eating late in the day - keeping daily fluid intake to ~ 2L - was walking 2 miles every other day but has gotten out of the habit; encouraged him to walk in the mornings as he says that he's already up  - on GDMT of carvedilol, farxiga, entresto and spironolactone - farxiga samples provided today - BNP 05/21/21 was 380.5  2: HTN- - BP elevated (152/60) but his weight has climbed and he's not exercising like he had been - discussed resuming his lifestyle changes that he had made - does not have PCP so this was scheduled with [facility] on 08/31/21 - BMP 07/16/21 reviewed and showed sodium 137, potassium 4.1, creatinine 1.24 and GFR >60  3: DM- - A1c 05/14/21 was 6.6% - currently diet controlled  4: Paroxysmal atrial flutter- - saw cardiology Fransico Michael) 07/16/21 - currently on carvedilol and xarelto  5: Obstructive sleep apnea- - wearing CPAP nightly - wearing oxygen at 4L PRN during the day   Medication bottles reviewed.   Return in 4 months, sooner if needed

## 2021-08-27 NOTE — Patient Instructions (Addendum)
Continue weighing daily and call for an overnight weight gain of 3 pounds or more or a weekly weight gain of more than 5 pounds.   If you have voicemail, please make sure your mailbox is cleaned out so that we may leave a message and please make sure to listen to any voicemails.    If you receive a satisfaction survey regarding the [facility], please take the time to fill it out. This way we can continue to provide excellent care and make any changes that need to be made.    Do your walking in the morning

## 2021-08-31 ENCOUNTER — Ambulatory Visit: Payer: Self-pay | Admitting: Internal Medicine

## 2021-09-06 ENCOUNTER — Other Ambulatory Visit: Payer: Self-pay

## 2021-09-07 ENCOUNTER — Other Ambulatory Visit: Payer: Self-pay

## 2021-09-09 ENCOUNTER — Other Ambulatory Visit: Payer: Self-pay

## 2021-09-13 ENCOUNTER — Other Ambulatory Visit: Payer: Self-pay

## 2021-09-17 ENCOUNTER — Other Ambulatory Visit: Payer: Self-pay

## 2021-09-20 ENCOUNTER — Ambulatory Visit: Payer: Self-pay | Admitting: Medical

## 2021-09-23 ENCOUNTER — Ambulatory Visit (INDEPENDENT_AMBULATORY_CARE_PROVIDER_SITE_OTHER): Payer: Commercial Managed Care - HMO

## 2021-09-23 DIAGNOSIS — I5022 Chronic systolic (congestive) heart failure: Secondary | ICD-10-CM

## 2021-09-23 LAB — ECHOCARDIOGRAM LIMITED
Area-P 1/2: 3.35 cm2
Calc EF: 55.2 %
S' Lateral: 3.1 cm
Single Plane A2C EF: 57 %
Single Plane A4C EF: 51.9 %

## 2021-10-07 ENCOUNTER — Other Ambulatory Visit: Payer: Self-pay

## 2021-10-21 ENCOUNTER — Other Ambulatory Visit: Payer: Self-pay

## 2021-10-26 NOTE — Progress Notes (Deleted)
Cardiology Office Note:    Date:  10/26/2021   ID:  Gregory Mckenzie, DOB 10/08/1975, MRN [id number]  PCP:  Patient, No Pcp Per  [facility] Cardiologist:  Yvonne Kendall, MD  [facility] Electrophysiologist:  None   Referring MD: Marianne Sofia, PA-C   Chief Complaint: echo follow-up  History of Present Illness:    Gregory Mckenzie is a 46 y.o. 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   He was admitted to the hospital in 03/2020 with volume overload and uncontrolled hypertension in the 180s over 100s.  Echo during that admission demonstrated an EF of 40 to 45%, global hypokinesis, severe concentric LVH, grade 2 diastolic dysfunction, normal PASP, severe biatrial enlargement, trivial mitral regurgitation, and an estimated right atrial pressure of 8 mmHg.  Lexiscan MPI was undertaken during that admission which showed no evidence of ischemia with extensive wall motion abnormalities including apparent dyskinesia involving the basilar aspect of the inferior wall of the LV with an EF of 31%.  He was diuresed and started on GDMT with symptomatic improvement.  He was also noted to have a brief episode of paroxysmal atrial flutter and was initiated on Xarelto.  He converted to sinus rhythm during the admission.  He was subsequently evaluated in our transitions of care clinic in 04/2020 and scheduled for a diagnostic R/LHC in 04/2020, which demonstrated nonobstructive CAD and mildly elevated filling pressures with mild pulmonary venous hypertension.  He was lost to follow-up until readmission in 04/2021 with acute on chronic combined CHF.  High-sensitivity troponin peaking at 67.  BNP 990.  He was diuresed 26 L with subsequent improvement in symptoms and AKI leading to the holding of diuresis and GDMT.  With  diuresis, BNP improved to 380.  Echo demonstrated a stable cardiomyopathy with an EF of 40 to 45%, global hypokinesis, mild LVH, grade 2 diastolic dysfunction, moderately reduced RV systolic function with mildly enlarged RV cavity size, moderate biatrial enlargement, mild mitral regurgitation, moderate to severe tricuspid regurgitation, and an estimated right atrial pressure of 3 mmHg.  Hospital course was subsequently complicated by acute respiratory distress requiring mechanical ventilation with concern for HAP with improvement following antibiotic therapy.   He was last seen 06/07/21 and was doing well from a cardiac perspective. He was started on Entresto for HFmrEF.   Last seen 07/16/21 and was overall doing well.   Past Medical History:  Diagnosis Date   Atrial flutter (HCC)    Chronic combined systolic and diastolic CHF (congestive heart failure) (HCC)    Diabetes mellitus without complication (HCC)    Hyperlipidemia    Hypertension    Obesity     Past Surgical History:  Procedure Laterality Date   RIGHT/LEFT HEART CATH AND CORONARY ANGIOGRAPHY N/A 05/08/2020   Procedure: RIGHT/LEFT HEART CATH AND CORONARY ANGIOGRAPHY;  Surgeon: Laurey Morale, MD;  Location: [facility] INVASIVE CV LAB;  Service: Cardiovascular;  Laterality: N/A;    Current Medications: No outpatient medications have been marked as taking for the 10/27/21 encounter (Appointment) with Fransico Michael, Clydene Burack H, PA-C.     Allergies:   Patient has no known allergies.   Social History   Socioeconomic History   Marital status: Single    Spouse name: Not on file   Number of children: Not on file   Years  of education: Not on file   Highest education level: Not on file  Occupational History   Not on file  Tobacco Use   Smoking status: Former    Packs/day: 1.00    Years: 27.00    Total pack years: 27.00    Types: Cigarettes    Quit date: 04/06/2020    Years since quitting: 1.5   Smokeless tobacco: Never  Vaping Use   Vaping  Use: Never used  Substance and Sexual Activity   Alcohol use: Not Currently   Drug use: Yes    Frequency: 7.0 times per week    Types: Marijuana    Comment: daily   Sexual activity: Yes    Partners: Female    Birth control/protection: None  Other Topics Concern   Not on file  Social History Narrative   Not on file   Social Determinants of Health   Financial Resource Strain: Low Risk  (04/09/2020)   Overall Financial Resource Strain (CARDIA)    Difficulty of Paying Living Expenses: Not very hard  Food Insecurity: No Food Insecurity (04/09/2020)   Hunger Vital Sign    Worried About Running Out of Food in the Last Year: Never true    Ran Out of Food in the Last Year: Never true  Transportation Needs: No Transportation Needs (04/09/2020)   PRAPARE - Hydrologist (Medical): No    Lack of Transportation (Non-Medical): No  Physical Activity: Inactive (04/09/2020)   Exercise Vital Sign    Days of Exercise per Week: 0 days    Minutes of Exercise per Session: 0 min  Stress: Not on file  Social Connections: Not on file     Family History: The patient's family history includes Hypertension in his brother and father; Pulmonary embolism in his brother.  ROS:   Please see the history of present illness.     All other systems reviewed and are negative.  EKGs/Labs/Other Studies Reviewed:    The following studies were reviewed today:  2D echo 05/15/2021:  1. Left ventricular ejection fraction, by estimation, is 40 to 45%. The  left ventricle has mildly decreased function. The left ventricle  demonstrates global hypokinesis. There is mild left ventricular  hypertrophy. Left ventricular diastolic parameters  are consistent with Grade II diastolic dysfunction (pseudonormalization).   2. Right ventricular systolic function is moderately reduced. The right  ventricular size is mildly enlarged.   3. Left atrial size was moderately dilated.   4. Right atrial size  was moderately dilated.   5. The mitral valve is normal in structure. Mild mitral valve  regurgitation. No evidence of mitral stenosis.   6. Tricuspid valve regurgitation is moderate to severe.   7. The aortic valve is normal in structure. Aortic valve regurgitation is  not visualized. No aortic stenosis is present.   8. The inferior vena cava is normal in size with greater than 50%  respiratory variability, suggesting right atrial pressure of 3 mmHg. __________   [facility] 05/08/2020: Prox RCA lesion is 30% stenosed. Mid RCA lesion is 30% stenosed. Ost LAD lesion is 30% stenosed. Prox LAD lesion is 30% stenosed.   1. Mildly elevated filling pressures.  2. Mild pulmonary venous hypertension.  3. Nonobstructive CAD.     Nonischemic cardiomyopathy.  __________   2D echo 04/08/2020: 1. Left ventricular ejection fraction, by estimation, is 40 to 45%. The  left ventricle has mildly decreased function. The left ventricle  demonstrates global hypokinesis. There is severe concentric  left  ventricular hypertrophy. Left ventricular diastolic   parameters are consistent with Grade II diastolic dysfunction  (pseudonormalization). Elevated left atrial pressure.   2. Right ventricular systolic function was not well visualized. The right  ventricular size is mildly enlarged. There is normal pulmonary artery  systolic pressure.   3. Left atrial size was severely dilated.   4. Right atrial size was severely dilated.   5. The mitral valve is normal in structure. Trivial mitral valve  regurgitation. No evidence of mitral stenosis.   6. The aortic valve is normal in structure. Aortic valve regurgitation is  not visualized. No aortic stenosis is present.   7. The inferior vena cava is dilated in size with >50% respiratory  variability, suggesting right atrial pressure of 8 mmHg.  EKG:  EKG is *** ordered today.  The ekg ordered today demonstrates ***  Recent Labs: 05/20/2021: TSH 0.758 05/21/2021: ALT  19; B Natriuretic Peptide 380.5 05/26/2021: Magnesium 2.3 05/28/2021: Hemoglobin 17.3; Platelets 216 07/16/2021: BUN 19; Creatinine, Ser 1.24; Potassium 4.1; Sodium 137  Recent Lipid Panel    Component Value Date/Time   CHOL 139 04/07/2020 2041   TRIG 45 05/23/2021 0606   HDL 35 (L) 04/07/2020 2041   CHOLHDL 4.0 04/07/2020 2041   VLDL 11 04/07/2020 2041   LDLCALC 93 04/07/2020 2041   LDLDIRECT 91.5 07/16/2021 1137     Risk Assessment/Calculations:   {Does this patient have ATRIAL FIBRILLATION?:[id number]}   Physical Exam:    VS:  There were no vitals taken for this visit.    Wt Readings from Last 3 Encounters:  08/27/21 288 lb (130.6 kg)  07/16/21 271 lb 9.6 oz (123.2 kg)  06/15/21 266 lb 6 oz (120.8 kg)     GEN: *** Well nourished, well developed in no acute distress HEENT: Normal NECK: No JVD; No carotid bruits LYMPHATICS: No lymphadenopathy CARDIAC: ***RRR, no murmurs, rubs, gallops RESPIRATORY:  Clear to auscultation without rales, wheezing or rhonchi  ABDOMEN: Soft, non-tender, non-distended MUSCULOSKELETAL:  No edema; No deformity  SKIN: Warm and dry NEUROLOGIC:  Alert and oriented x 3 PSYCHIATRIC:  Normal affect   ASSESSMENT:    No diagnosis found. PLAN:    In order of problems listed above:  Chronic systolic and diastolic CHF  Paroxysmal Aflutter  Nonobstructive CAD  HTN  HLD  CKD stage 3  Disposition: Follow up {follow up:15908} with ***   Shared Decision Making/Informed Consent   {Are you ordering a CV Procedure (e.g. stress test, cath, DCCV, TEE, etc)?   Press F2        :[id number]}    Signed, Maximiano Lott David Stall, PA-C  10/26/2021 3:05 PM    [facility]

## 2021-10-27 ENCOUNTER — Ambulatory Visit: Payer: Commercial Managed Care - HMO | Admitting: Medical

## 2021-12-03 ENCOUNTER — Encounter: Payer: Self-pay | Admitting: Medical

## 2021-12-03 ENCOUNTER — Other Ambulatory Visit: Payer: Self-pay

## 2021-12-03 ENCOUNTER — Ambulatory Visit: Payer: Commercial Managed Care - HMO | Attending: Medical | Admitting: Medical

## 2021-12-03 ENCOUNTER — Other Ambulatory Visit: Payer: Self-pay | Admitting: *Deleted

## 2021-12-03 VITALS — BP 120/84 | HR 95 | Ht 66.0 in | Wt 300.4 lb

## 2021-12-03 DIAGNOSIS — I5022 Chronic systolic (congestive) heart failure: Secondary | ICD-10-CM

## 2021-12-03 DIAGNOSIS — G4733 Obstructive sleep apnea (adult) (pediatric): Secondary | ICD-10-CM

## 2021-12-03 DIAGNOSIS — N1831 Chronic kidney disease, stage 3a: Secondary | ICD-10-CM

## 2021-12-03 DIAGNOSIS — E782 Mixed hyperlipidemia: Secondary | ICD-10-CM | POA: Diagnosis not present

## 2021-12-03 DIAGNOSIS — I4892 Unspecified atrial flutter: Secondary | ICD-10-CM

## 2021-12-03 DIAGNOSIS — I10 Essential (primary) hypertension: Secondary | ICD-10-CM | POA: Diagnosis not present

## 2021-12-03 MED ORDER — RIVAROXABAN 20 MG PO TABS
20.0000 mg | ORAL_TABLET | Freq: Every day | ORAL | 0 refills | Status: DC
  Filled 2021-12-03 – 2022-03-30 (×4): qty 90, 90d supply, fill #0

## 2021-12-03 MED ORDER — SACUBITRIL-VALSARTAN 24-26 MG PO TABS
1.0000 | ORAL_TABLET | Freq: Two times a day (BID) | ORAL | 11 refills | Status: DC
  Filled 2021-12-03 – 2022-03-30 (×4): qty 60, 30d supply, fill #0
  Filled 2022-09-19: qty 60, 30d supply, fill #1

## 2021-12-03 NOTE — Progress Notes (Signed)
Cardiology Office Note:    Date:  12/03/2021   ID:  [name], DOB 11-09-1975, MRN [id number]  PCP:  Patient, No Pcp Per  [facility] Cardiologist:  Nelva Bush, MD  [facility] Electrophysiologist:  None   Referring MD: Antony Madura, PA-C   Chief Complaint: Follow-up echo  History of Present Illness:    Gregory Mckenzie is a 46 y.o. 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   He was admitted to the hospital in 03/2020 with volume overload and uncontrolled hypertension in the 180s over 100s.  Echo during that admission demonstrated an EF of 40 to 45%, global hypokinesis, severe concentric LVH, grade 2 diastolic dysfunction, normal PASP, severe biatrial enlargement, trivial mitral regurgitation, and an estimated right atrial pressure of 8 mmHg.  Lexiscan MPI was undertaken during that admission which showed no evidence of ischemia with extensive wall motion abnormalities including apparent dyskinesia involving the basilar aspect of the inferior wall of the LV with an EF of 31%.  He was diuresed and started on GDMT with symptomatic improvement.  He was also noted to have a brief episode of paroxysmal atrial flutter and was initiated on Xarelto.  He converted to sinus rhythm during the admission.  He was subsequently evaluated in our transitions of care clinic in 04/2020 and scheduled for a diagnostic R/LHC in 04/2020, which demonstrated nonobstructive CAD and mildly elevated filling pressures with mild pulmonary venous hypertension.  He was lost to follow-up until readmission in 04/2021 with acute on chronic combined CHF.  High-sensitivity troponin peaking at 67.  BNP 990.  He was diuresed 26 L with subsequent improvement in symptoms and AKI leading to the holding of diuresis and GDMT.  With  diuresis, BNP improved to 380.  Echo demonstrated a stable cardiomyopathy with an EF of 40 to 45%, global hypokinesis, mild LVH, grade 2 diastolic dysfunction, moderately reduced RV systolic function with mildly enlarged RV cavity size, moderate biatrial enlargement, mild mitral regurgitation, moderate to severe tricuspid regurgitation, and an estimated right atrial pressure of 3 mmHg.  Hospital course was subsequently complicated by acute respiratory distress requiring mechanical ventilation with concern for HAP with improvement following antibiotic therapy.   Last seen 07/16/21 and was doing well. Repeat limited echo was ordered. This showed LVEF 55-60%, moderate LVH.   Today, the repeat limited echo was reviewed. He denies chest pain, SOB, LLE, orthopnea or pnd. He needs refills entresto and Xarelto. He has a CPAP machine but says he has not had a sleep study.  Past Medical History:  Diagnosis Date   Atrial flutter (HCC)    Chronic combined systolic and diastolic CHF (congestive heart failure) ([place])    Diabetes mellitus without complication ([place])    Hyperlipidemia    Hypertension    Obesity     Past Surgical History:  Procedure Laterality Date   RIGHT/LEFT HEART CATH AND CORONARY ANGIOGRAPHY N/A 05/08/2020   Procedure: RIGHT/LEFT HEART CATH AND CORONARY ANGIOGRAPHY;  Surgeon: Larey Dresser, MD;  Location: [place] CV LAB;  Service: Cardiovascular;  Laterality: N/A;    Current Medications: Current Meds  Medication Sig   atorvastatin (LIPITOR) 40 MG tablet Take 2 tablets (80 mg total) by mouth at bedtime.   carvedilol (COREG) 6.25 MG tablet Take 1 tablet (6.25 mg total) by  mouth 2 (two) times daily with a meal.   dapagliflozin propanediol (FARXIGA) 10 MG TABS tablet Take 1 tablet (10 mg total) by mouth once daily.   OXYGEN Inhale 4 L into the lungs at bedtime.   rivaroxaban (XARELTO) 20 MG TABS tablet Take 1 tablet (20 mg total) by mouth once daily with supper.   spironolactone  (ALDACTONE) 25 MG tablet Take 1 tablet (25 mg total) by mouth once daily.   torsemide (DEMADEX) 20 MG tablet Take 1 tablet (20 mg total) by mouth once daily.   [DISCONTINUED] sacubitril-valsartan (ENTRESTO) 24-26 MG Take 1 tablet by mouth 2 (two) times daily.     Allergies:   Patient has no known allergies.   Social History   Socioeconomic History   Marital status: Single    Spouse name: Not on file   Number of children: Not on file   Years of education: Not on file   Highest education level: Not on file  Occupational History   Not on file  Tobacco Use   Smoking status: Former    Packs/day: 1.00    Years: 27.00    Total pack years: 27.00    Types: Cigarettes    Quit date: 04/06/2020    Years since quitting: 1.6   Smokeless tobacco: Never  Vaping Use   Vaping Use: Never used  Substance and Sexual Activity   Alcohol use: Not Currently   Drug use: Yes    Frequency: 7.0 times per week    Types: Marijuana    Comment: daily   Sexual activity: Yes    Partners: Female    Birth control/protection: None  Other Topics Concern   Not on file  Social History Narrative   Not on file   Social Determinants of Health   Financial Resource Strain: Low Risk  (04/09/2020)   Overall Financial Resource Strain (CARDIA)    Difficulty of Paying Living Expenses: Not very hard  Food Insecurity: No Food Insecurity (04/09/2020)   Hunger Vital Sign    Worried About Running Out of Food in the Last Year: Never true    Ran Out of Food in the Last Year: Never true  Transportation Needs: No Transportation Needs (04/09/2020)   PRAPARE - Hydrologist (Medical): No    Lack of Transportation (Non-Medical): No  Physical Activity: Inactive (04/09/2020)   Exercise Vital Sign    Days of Exercise per Week: 0 days    Minutes of Exercise per Session: 0 min  Stress: Not on file  Social Connections: Not on file     Family History: The patient's family history includes  Hypertension in his brother and father; Pulmonary embolism in his brother.  ROS:   Please see the history of present illness.     All other systems reviewed and are negative.  EKGs/Labs/Other Studies Reviewed:    The following studies were reviewed today:    2D echo 05/15/2021:  1. Left ventricular ejection fraction, by estimation, is 40 to 45%. The  left ventricle has mildly decreased function. The left ventricle  demonstrates global hypokinesis. There is mild left ventricular  hypertrophy. Left ventricular diastolic parameters  are consistent with Grade II diastolic dysfunction (pseudonormalization).   2. Right ventricular systolic function is moderately reduced. The right  ventricular size is mildly enlarged.   3. Left atrial size was moderately dilated.   4. Right atrial size was moderately dilated.   5. The mitral valve is normal in structure. Mild mitral  valve  regurgitation. No evidence of mitral stenosis.   6. Tricuspid valve regurgitation is moderate to severe.   7. The aortic valve is normal in structure. Aortic valve regurgitation is  not visualized. No aortic stenosis is present.   8. The inferior vena cava is normal in size with greater than 50%  respiratory variability, suggesting right atrial pressure of 3 mmHg. __________   [facility] 05/08/2020: Prox RCA lesion is 30% stenosed. Mid RCA lesion is 30% stenosed. Ost LAD lesion is 30% stenosed. Prox LAD lesion is 30% stenosed.   1. Mildly elevated filling pressures.  2. Mild pulmonary venous hypertension.  3. Nonobstructive CAD.     Nonischemic cardiomyopathy.  __________   2D echo 04/08/2020: 1. Left ventricular ejection fraction, by estimation, is 40 to 45%. The  left ventricle has mildly decreased function. The left ventricle  demonstrates global hypokinesis. There is severe concentric left  ventricular hypertrophy. Left ventricular diastolic   parameters are consistent with Grade II diastolic dysfunction   (pseudonormalization). Elevated left atrial pressure.   2. Right ventricular systolic function was not well visualized. The right  ventricular size is mildly enlarged. There is normal pulmonary artery  systolic pressure.   3. Left atrial size was severely dilated.   4. Right atrial size was severely dilated.   5. The mitral valve is normal in structure. Trivial mitral valve  regurgitation. No evidence of mitral stenosis.   6. The aortic valve is normal in structure. Aortic valve regurgitation is  not visualized. No aortic stenosis is present.   7. The inferior vena cava is dilated in size with >50% respiratory  variability, suggesting right atrial pressure of 8 mmHg.  EKG:  EKG is ordered today.  The ekg ordered today demonstrates NSR with PVCs, nonspecific T wave changes  Recent Labs: 05/20/2021: TSH 0.758 05/21/2021: ALT 19; B Natriuretic Peptide 380.5 05/26/2021: Magnesium 2.3 05/28/2021: Hemoglobin 17.3; Platelets 216 07/16/2021: BUN 19; Creatinine, Ser 1.24; Potassium 4.1; Sodium 137  Recent Lipid Panel    Component Value Date/Time   CHOL 139 04/07/2020 2041   TRIG 45 05/23/2021 0606   HDL 35 (L) 04/07/2020 2041   CHOLHDL 4.0 04/07/2020 2041   VLDL 11 04/07/2020 2041   LDLCALC 93 04/07/2020 2041   LDLDIRECT 91.5 07/16/2021 1137     Physical Exam:    VS:  BP 120/84 (BP Location: Left Arm, Patient Position: Sitting, Cuff Size: Large)   Pulse 95   Ht 5\' 6"  (1.676 m)   Wt (!) 300 lb 6.4 oz (136.3 kg)   SpO2 (!) 89%   BMI 48.49 kg/m     Wt Readings from Last 3 Encounters:  12/03/21 (!) 300 lb 6.4 oz (136.3 kg)  08/27/21 288 lb (130.6 kg)  07/16/21 271 lb 9.6 oz (123.2 kg)     GEN:  Well nourished, well developed in no acute distress HEENT: Normal NECK: No JVD; No carotid bruits LYMPHATICS: No lymphadenopathy CARDIAC: RRR, no murmurs, rubs, gallops RESPIRATORY:  Clear to auscultation without rales, wheezing or rhonchi  ABDOMEN: Soft, non-tender,  non-distended MUSCULOSKELETAL:  No edema; No deformity  SKIN: Warm and dry NEUROLOGIC:  Alert and oriented x 3 PSYCHIATRIC:  Normal affect   ASSESSMENT:    1. Chronic systolic heart failure (HCC)   2. Paroxysmal atrial flutter ([place])   3. Essential hypertension   4. Hyperlipidemia, mixed   5. Stage 3a chronic kidney disease ([place])   6. Obstructive sleep apnea syndrome    PLAN:  In order of problems listed above:  Chronic systolic and diastolic CHF NICM Repeat limited echo showed improved LVEF to 55-60%. He is euvolemic on exam today. Continue Coreg 6.25mg  BID, Farxiga 10mg  daily, Entresto 24-26mg  BID and spironolactone 20mg  daily. We will refill Entresto. Continue Torsemide 20mg  daily.   Paroxysmal Aflutter EKG shows NSR. Continue Xalreto for stroke ppx, we will refill this. Continue Coreg for rate control.   Nonobstructive CAD Patient denies anginal symptoms. Continue medical management with Aspirin, statin and BB therapy. No further ischemic work-up indicated at this time.   HTN BP is good, continue current medications.  HLD LDL 91. Continue Lipitor 40mg  daily, can consider increasing this at follow-up.   CKD stage 3 Follow-up labs were normal.   OSA We will refer him to pulmonology   Disposition: Follow up in 4 month(s) with MD/APP     Signed, Terrika Zuver Ninfa Meeker, PA-C  12/03/2021 9:53 AM    [place]

## 2021-12-03 NOTE — Telephone Encounter (Signed)
Prescription refill request for Xarelto received.   Indication: afib  Last office visit:12/03/2021, Furth Weight: 136.3.kg Age: 46 yo  Scr: 1.24, 07/16/2021 CrCl: 140 ml/min   Refill sent.

## 2021-12-03 NOTE — Patient Instructions (Signed)
Medication Instructions:  Your physician recommends that you continue on your current medications as directed. Please refer to the Current Medication list given to you today.   Labwork: None  Testing/Procedures: None  Follow-Up: Follow up with Cadence Furth, PA-C in 4 months.   Any Other Special Instructions Will Be Listed Below (If Applicable).     If you need a refill on your cardiac medications before your next appointment, please call your pharmacy.  

## 2021-12-06 NOTE — Addendum Note (Signed)
Addended by: James Ivanoff D on: 12/06/2021 12:48 PM   Modules accepted: Orders

## 2021-12-21 ENCOUNTER — Encounter: Payer: Self-pay | Admitting: Pharmacist

## 2021-12-21 ENCOUNTER — Encounter: Payer: Self-pay | Admitting: Family

## 2021-12-21 ENCOUNTER — Other Ambulatory Visit
Admission: RE | Admit: 2021-12-21 | Discharge: 2021-12-21 | Disposition: A | Discharge status: Home / Self Care | Payer: Commercial Managed Care - HMO | Source: Ambulatory Visit | Attending: Family | Admitting: Family

## 2021-12-21 ENCOUNTER — Other Ambulatory Visit: Payer: Self-pay

## 2021-12-21 ENCOUNTER — Ambulatory Visit (HOSPITAL_BASED_OUTPATIENT_CLINIC_OR_DEPARTMENT_OTHER): Payer: Commercial Managed Care - HMO | Admitting: Family

## 2021-12-21 VITALS — BP 152/108 | HR 90 | Resp 18 | Ht 66.0 in | Wt 303.0 lb

## 2021-12-21 DIAGNOSIS — I10 Essential (primary) hypertension: Secondary | ICD-10-CM | POA: Diagnosis not present

## 2021-12-21 DIAGNOSIS — I5022 Chronic systolic (congestive) heart failure: Secondary | ICD-10-CM | POA: Insufficient documentation

## 2021-12-21 DIAGNOSIS — G4733 Obstructive sleep apnea (adult) (pediatric): Secondary | ICD-10-CM | POA: Diagnosis not present

## 2021-12-21 DIAGNOSIS — E1122 Type 2 diabetes mellitus with diabetic chronic kidney disease: Secondary | ICD-10-CM

## 2021-12-21 DIAGNOSIS — N1831 Chronic kidney disease, stage 3a: Secondary | ICD-10-CM

## 2021-12-21 LAB — HEMOGLOBIN A1C
Hgb A1c MFr Bld: 6.6 % — ABNORMAL HIGH (ref 4.8–5.6)
Mean Plasma Glucose: 142.72 mg/dL

## 2021-12-21 LAB — BASIC METABOLIC PANEL
Anion gap: 7 (ref 5–15)
BUN: 24 mg/dL — ABNORMAL HIGH (ref 6–20)
CO2: 27 mmol/L (ref 22–32)
Calcium: 9.4 mg/dL (ref 8.9–10.3)
Chloride: 104 mmol/L (ref 98–111)
Creatinine, Ser: 1.62 mg/dL — ABNORMAL HIGH (ref 0.61–1.24)
GFR, Estimated: 52 mL/min — ABNORMAL LOW (ref >60)
Glucose, Bld: 125 mg/dL — ABNORMAL HIGH (ref 70–99)
Potassium: 3.6 mmol/L (ref 3.5–5.1)
Sodium: 138 mmol/L (ref 135–145)

## 2021-12-21 NOTE — Patient Instructions (Addendum)
Continue weighing daily and call for an overnight weight gain of 3 pounds or more or a weekly weight gain of more than 5 pounds.   If you have voicemail, please make sure your mailbox is cleaned out so that we may leave a message and please make sure to listen to any voicemails.    Decrease fluid intake to 60-64 ounces daily.

## 2021-12-21 NOTE — Progress Notes (Deleted)
New patient visit   Patient: Gregory Mckenzie   DOB: [birth year]   46 y.o. Male  MRN: [id number] Visit Date: 12/24/2021  Today's healthcare provider: Mardene Speak, PA-C   No chief complaint on file.  Subjective    Gregory Mckenzie is a 46 y.o. male who presents today as a new patient to establish care.  HPI  ***  Past Medical History:  Diagnosis Date   Atrial flutter (HCC)    Chronic combined systolic and diastolic CHF (congestive heart failure) ([place])    Diabetes mellitus without complication ([place])    Hyperlipidemia    Hypertension    Obesity    Past Surgical History:  Procedure Laterality Date   RIGHT/LEFT HEART CATH AND CORONARY ANGIOGRAPHY N/A 05/08/2020   Procedure: RIGHT/LEFT HEART CATH AND CORONARY ANGIOGRAPHY;  Surgeon: Larey Dresser, MD;  Location: [facility];  Service: Cardiovascular;  Laterality: N/A;   Family Status  Relation Name Status   Father  (Not Specified)   Brother  (Not Specified)   Family History  Problem Relation Age of Onset   Hypertension Father    Hypertension Brother    Pulmonary embolism Brother    Social History   Socioeconomic History   Marital status: Single    Spouse name: Not on file   Number of children: Not on file   Years of education: Not on file   Highest education level: Not on file  Occupational History   Not on file  Tobacco Use   Smoking status: Former    Packs/day: 1.00    Years: 27.00    Total pack years: 27.00    Types: Cigarettes    Quit date: 04/06/2020    Years since quitting: 1.7   Smokeless tobacco: Never  Vaping Use   Vaping Use: Never used  Substance and Sexual Activity   Alcohol use: Not Currently   Drug use: Yes    Frequency: 7.0 times per week    Types: Marijuana    Comment: daily   Sexual activity: Yes    Partners: Female    Birth control/protection: None  Other Topics Concern   Not on file  Social History Narrative   Not on file   Social Determinants of Health   Financial  Resource Strain: Low Risk  (04/09/2020)   Overall Financial Resource Strain (CARDIA)    Difficulty of Paying Living Expenses: Not very hard  Food Insecurity: No Food Insecurity (04/09/2020)   Hunger Vital Sign    Worried About Running Out of Food in the Last Year: Never true    Ran Out of Food in the Last Year: Never true  Transportation Needs: No Transportation Needs (04/09/2020)   PRAPARE - Hydrologist (Medical): No    Lack of Transportation (Non-Medical): No  Physical Activity: Inactive (04/09/2020)   Exercise Vital Sign    Days of Exercise per Week: 0 days    Minutes of Exercise per Session: 0 min  Stress: Not on file  Social Connections: Not on file   Outpatient Medications Prior to Visit  Medication Sig   atorvastatin (LIPITOR) 40 MG tablet Take 2 tablets (80 mg total) by mouth at bedtime.   carvedilol (COREG) 6.25 MG tablet Take 1 tablet (6.25 mg total) by mouth 2 (two) times daily with a meal.   dapagliflozin propanediol (FARXIGA) 10 MG TABS tablet Take 1 tablet (10 mg total) by mouth once daily.   OXYGEN Inhale 4 L  into the lungs at bedtime.   rivaroxaban (XARELTO) 20 MG TABS tablet Take 1 tablet (20 mg total) by mouth once daily with supper.   sacubitril-valsartan (ENTRESTO) 24-26 MG Take 1 tablet by mouth 2 (two) times daily.   spironolactone (ALDACTONE) 25 MG tablet Take 1 tablet (25 mg total) by mouth once daily.   torsemide (DEMADEX) 20 MG tablet Take 1 tablet (20 mg total) by mouth once daily.   No facility-administered medications prior to visit.   No Known Allergies   There is no immunization history on file for this patient.  Health Maintenance  Topic Date Due   COVID-19 Vaccine (1) Never done   FOOT EXAM  Never done   OPHTHALMOLOGY EXAM  Never done   Hepatitis C Screening  Never done   TETANUS/TDAP  Never done   COLONOSCOPY (Pts 45-8yrs Insurance coverage will need to be confirmed)  Never done   Diabetic kidney evaluation -  Urine ACR  04/08/2021   INFLUENZA VACCINE  Never done   HEMOGLOBIN A1C  11/13/2021   Diabetic kidney evaluation - GFR measurement  12/22/2022   HIV Screening  Completed   HPV VACCINES  Aged Out    Patient Care Team: Patient, No Pcp Per as PCP - General ([place]) End, Harrell Gave, MD as PCP - Cardiology (Cardiology) Alisa Graff, FNP as Nurse Practitioner (Cardiology)  Review of Systems  {Labs  Heme  Chem  Endocrine  Serology  Results Review (optional):23779}   Objective    There were no vitals taken for this visit. {Show previous vital signs (optional):23777}  Physical Exam ***  Depression Screen    06/15/2021   12:16 PM  PHQ 2/9 Scores  PHQ - 2 Score 0   No results found for any visits on 12/24/21.  Assessment & Plan     ***  No follow-ups on file.     {provider attestation***:1}   Mardene Speak, Hershal Coria  [facility] [phone] (phone) [phone] (fax)  [place]

## 2021-12-21 NOTE — Progress Notes (Signed)
Patient ID: Gregory Mckenzie, male    DOB: 1975/10/10, 46 y.o.   MRN: [id number]  HPI  Mr Say is a 46 y/o male with a history of DM, hyperlipidemia, HTN, atrial flutter, obesity, previous tobacco use and chronic heart failure.   Echo report from 05/15/21 reviewed and showed an EF of 40-45% along with mild LVH, moderate LAE, mild MR & moderate/severe TR.   LHC done 05/08/20 showed: Prox RCA lesion is 30% stenosed. Mid RCA lesion is 30% stenosed. Ost LAD lesion is 30% stenosed. Prox LAD lesion is 30% stenosed.   1. Mildly elevated filling pressures.  2. Mild pulmonary venous hypertension.  3. Nonobstructive CAD.   Nonischemic cardiomyopathy.   Has not been admitted or been in the ED in the last 6 months.   He presents today with a chief complaint of a follow-up visit. He currently voices no complaints and specifically denies any difficulty sleeping, dizziness, abdominal distention, palpitations, pedal edema, chest pain, wheezing, shortness of breath, cough or fatigue.   Says that he hasn't been weighing daily but does have working scales. Is now eating [place] sausage or [place] bacon at breakfast and no more eggs. Trying to eat more fish like salmon.   Had been having some wheezing when he wasn't wearing his oxygen at bedtime. Now that he has new tubing and is wearing the oxygen, the wheezing has resolved. Still waiting on new CPAP equipment.   Has not taken his medications yet today but will do so upon his return home.   Currently drinking 64 ounces of water, 24 ounces of Mtn Dew and 2 cups of OJ every day.   Past Medical History:  Diagnosis Date   Atrial flutter (HCC)    Chronic combined systolic and diastolic CHF (congestive heart failure) (HCC)    Diabetes mellitus without complication (HCC)    Hyperlipidemia    Hypertension    Obesity    Past Surgical History:  Procedure Laterality Date   RIGHT/LEFT HEART CATH AND CORONARY ANGIOGRAPHY N/A 05/08/2020   Procedure: RIGHT/LEFT  HEART CATH AND CORONARY ANGIOGRAPHY;  Surgeon: Laurey Morale, MD;  Location: [facility];  Service: Cardiovascular;  Laterality: N/A;   Family History  Problem Relation Age of Onset   Hypertension Father    Hypertension Brother    Pulmonary embolism Brother    Social History   Tobacco Use   Smoking status: Former    Packs/day: 1.00    Years: 27.00    Total pack years: 27.00    Types: Cigarettes    Quit date: 04/06/2020    Years since quitting: 1.7   Smokeless tobacco: Never  Substance Use Topics   Alcohol use: Not Currently   No Known Allergies  Prior to Admission medications   Medication Sig Start Date End Date Taking? Authorizing Provider  atorvastatin (LIPITOR) 40 MG tablet Take 2 tablets (80 mg total) by mouth at bedtime. 07/20/21  Yes Iran Ouch, MD  carvedilol (COREG) 6.25 MG tablet Take 1 tablet (6.25 mg total) by mouth 2 (two) times daily with a meal. 06/25/21  Yes Clarisa Kindred A, FNP  dapagliflozin propanediol (FARXIGA) 10 MG TABS tablet Take 1 tablet (10 mg total) by mouth once daily. 08/27/21  Yes Malcom Selmer A, FNP  OXYGEN Inhale 4 L into the lungs at bedtime.   Yes [provider]  rivaroxaban (XARELTO) 20 MG TABS tablet Take 1 tablet (20 mg total) by mouth once daily with supper. 12/03/21  Yes Furth, Cadence H, PA-C  sacubitril-valsartan (ENTRESTO) 24-26 MG Take 1 tablet by mouth 2 (two) times daily. 12/03/21  Yes Furth, Cadence H, PA-C  spironolactone (ALDACTONE) 25 MG tablet Take 1 tablet (25 mg total) by mouth once daily. 06/25/21  Yes Clarisa Kindred A, FNP  torsemide (DEMADEX) 20 MG tablet Take 1 tablet (20 mg total) by mouth once daily. 06/25/21  Yes Delma Freeze, FNP   Review of Systems  Constitutional:  Negative for appetite change and fatigue.  HENT:  Negative for congestion, postnasal drip and sore throat.   Eyes: Negative.   Respiratory:  Negative for cough, shortness of breath and wheezing.   Cardiovascular:  Negative for chest  pain, palpitations and leg swelling.  Gastrointestinal:  Negative for abdominal distention and abdominal pain.  Endocrine: Negative.   Genitourinary: Negative.   Musculoskeletal:  Negative for back pain and neck pain.  Skin: Negative.   Allergic/Immunologic: Negative.   Neurological:  Negative for dizziness and light-headedness.  Hematological:  Negative for adenopathy. Does not bruise/bleed easily.  Psychiatric/Behavioral:  Negative for dysphoric mood and sleep disturbance (wearing oxygen at 4L @ bedtime; sleeping on 2 pillows). The patient is not nervous/anxious.    Vitals:   12/21/21 1007  BP: (!) 152/108  Pulse: 90  Resp: 18  SpO2: 93%  Weight: (!) 303 lb (137.4 kg)  Height: 5\' 6"  (1.676 m)   Wt Readings from Last 3 Encounters:  12/21/21 (!) 303 lb (137.4 kg)  12/03/21 (!) 300 lb 6.4 oz (136.3 kg)  08/27/21 288 lb (130.6 kg)   Lab Results  Component Value Date   CREATININE 1.62 (H) 12/21/2021   CREATININE 1.24 07/16/2021   CREATININE 1.46 (H) 06/15/2021   Physical Exam Vitals and nursing note reviewed.  Constitutional:      Appearance: Normal appearance.  HENT:     Head: Normocephalic and atraumatic.  Cardiovascular:     Rate and Rhythm: Normal rate and regular rhythm.  Pulmonary:     Effort: Pulmonary effort is normal. No respiratory distress.     Breath sounds: No wheezing or rales.  Abdominal:     General: There is no distension.     Palpations: Abdomen is soft.     Tenderness: There is no abdominal tenderness.  Musculoskeletal:        General: No tenderness.     Cervical back: Normal range of motion and neck supple.     Right lower leg: No edema.     Left lower leg: No edema.  Skin:    General: Skin is warm and dry.  Neurological:     General: No focal deficit present.     Mental Status: He is alert and oriented to person, place, and time.  Psychiatric:        Mood and Affect: Mood normal.        Behavior: Behavior normal.        Thought Content:  Thought content normal.    Assessment & Plan:  1: Chronic heart failure with mildly reduced ejection fraction- - NYHA class I - euvolemic today - not weighing daily but does have working scales; emphasized resuming so that he can call for an overnight weight gain of > 2 pounds or a weekly weight gain of > 5 pounds - weight up 15 pounds from last visit here 4 months ago - not adding salt to his food and is reading food labels for sodium content; is now trying to eat more baked/ grilled  salmon instead of eating out - on GDMT of carvedilol, farxiga, entresto and spironolactone - drinking 64 ounces of water, 24 ounces of Mtn Dew and 2 cups of OJ daily; reviewed keeping daily fluid intake to 60-64 ounces (2L) - BNP 05/21/21 was 380.5 - has received his flu vaccine for this season - PharmD reconciled medications with the patient  2: HTN- - BP elevated (152/108) but he hasn't taken his medications yet today but will do so upon his return home; last BP reading on 12/03/21 was normal at 120/84 - check BP daily at home and bring BP log to each visit - have scheduled NP appt at [facility] for 12/24/21 - BMP 07/16/21 reviewed and showed sodium 137, potassium 4.1, creatinine 1.24 and GFR >60 - get updated BMP today  3: DM- - A1c 05/14/21 was 6.6% - currently diet controlled - get updated A1c today  4: Paroxysmal atrial flutter- - saw cardiology Kathlen Mody) 12/03/21 - currently on carvedilol and xarelto  5: Obstructive sleep apnea- - waiting to get new CPAP equipment delivered so he can resume wearing this;  - wearing oxygen at 4L PRN during the day & at bedtime   Medication list reviewed.   Return in 4 months, sooner if needed

## 2021-12-22 NOTE — Progress Notes (Signed)
Patient ID: Gregory Mckenzie, male   DOB: [birth year], 46 y.o.   MRN: [id number] [place] COUNSELING NOTE  Guideline-Directed Medical Therapy/Evidence Based Medicine  ACE/ARB/ARNI: Sacubitril-valsartan 24-26 mg twice daily Beta Blocker: Carvedilol 6.25 mg twice daily Aldosterone Antagonist: Spironolactone 25 mg daily Diuretic: Torsemide 20 mg daily SGLT2i: Dapagliflozin 10 mg daily  Adherence Assessment  Do you ever forget to take your medication? [] Yes [x] No  Do you ever skip doses due to side effects? [] Yes [x] No  Do you have trouble affording your medicines? [] Yes [x] No  Are you ever unable to pick up your medication due to transportation difficulties? [] Yes [x] No  Do you ever stop taking your medications because you don't believe they are helping? [] Yes [x] No  Do you check your weight daily? [] Yes [x] No   Adherence strategy: none  Barriers to obtaining medications: none  Vital signs: HR 90, BP 152/108, weight (pounds) 303 lbs ECHO: Date 05/17/11, EF 40-45%, notes mild LVH, moderate LAE, mild MR & moderate/severe TR.      Latest Ref Rng & Units 12/21/2021   11:08 AM 07/16/2021   11:37 AM 06/15/2021   12:57 PM  BMP  Glucose 70 - 99 mg/dL 125  138  114   BUN 6 - 20 mg/dL 24  19  23    Creatinine 0.61 - 1.24 mg/dL 1.62  1.24  1.46   Sodium 135 - 145 mmol/L 138  137  136   Potassium 3.5 - 5.1 mmol/L 3.6  4.1  4.0   Chloride 98 - 111 mmol/L 104  100  98   CO2 22 - 32 mmol/L 27  30  29    Calcium 8.9 - 10.3 mg/dL 9.4  9.9  9.8     Past Medical History:  Diagnosis Date   Atrial flutter (HCC)    Chronic combined systolic and diastolic CHF (congestive heart failure) (HCC)    Diabetes mellitus without complication ([place])    Hyperlipidemia    Hypertension    Obesity     ASSESSMENT 46 year old male who presents to the HF clinic for follow up. PMH includes DM, hyperlipidemia, HTN, atrial flutter, obesity, previous tobacco  use and chronic heart failure. Last admission to acute care was > 6 months ago.   [facility]Rec completed. Patient reports increased wheezing after sleeping without his oxygen for "few days". His CPAP mask broke and patient still waiting for replacement mask. Denies swelling, deceased appetite, dizziness, or fatigue. Patient does not weight every day. Has not taken any of his morning meds today either, but reports SBP ~ 130 at home. Noted BP 120/84 during last cardiologist visit 2 weeks ago.   PLAN  Continue current medication without changes. Weight daily and bring BP log to next OV Consider Entrsto titration during next f/u visit if BP and renal function remain stable.  Time spent: 15 minutes  [name] PharmD, BCPS 12/22/2021 7:48 AM    Current Outpatient Medications:    atorvastatin (LIPITOR) 40 MG tablet, Take 2 tablets (80 mg total) by mouth at bedtime., Disp: 180 tablet, Rfl: 1   carvedilol (COREG) 6.25 MG tablet, Take 1 tablet (6.25 mg total) by mouth 2 (two) times daily with a meal., Disp: 180 tablet, Rfl: 3   dapagliflozin propanediol (FARXIGA) 10 MG TABS tablet, Take 1 tablet (10 mg total) by mouth once daily., Disp: 90 tablet, Rfl: 3   OXYGEN, Inhale 4 L into the lungs at bedtime., Disp: , Rfl:  rivaroxaban (XARELTO) 20 MG TABS tablet, Take 1 tablet (20 mg total) by mouth once daily with supper., Disp: 90 tablet, Rfl: 0   sacubitril-valsartan (ENTRESTO) 24-26 MG, Take 1 tablet by mouth 2 (two) times daily., Disp: 60 tablet, Rfl: 11   spironolactone (ALDACTONE) 25 MG tablet, Take 1 tablet (25 mg total) by mouth once daily., Disp: 90 tablet, Rfl: 3   torsemide (DEMADEX) 20 MG tablet, Take 1 tablet (20 mg total) by mouth once daily., Disp: 90 tablet, Rfl: 3   MEDICATION ADHERENCES TIPS AND STRATEGIES Taking medication as prescribed improves patient outcomes in heart failure (reduces hospitalizations, improves symptoms, increases survival) Side effects of medications  can be managed by decreasing doses, switching agents, stopping drugs, or adding additional therapy. Please let someone in the Heart Failure Clinic know if you have having bothersome side effects so we can modify your regimen. Do not alter your medication regimen without talking to [place].  Medication reminders can help patients remember to take drugs on time. If you are missing or forgetting doses you can try linking behaviors, using pill boxes, or an electronic reminder like an alarm on your phone or an app. Some people can also get automated phone calls as medication reminders.

## 2021-12-24 ENCOUNTER — Ambulatory Visit: Payer: Self-pay | Admitting: Physician Assistant

## 2022-01-24 ENCOUNTER — Other Ambulatory Visit: Payer: Self-pay

## 2022-01-24 ENCOUNTER — Other Ambulatory Visit: Payer: Self-pay | Admitting: Cardiovascular Disease

## 2022-01-24 MED ORDER — ATORVASTATIN CALCIUM 40 MG PO TABS
80.0000 mg | ORAL_TABLET | Freq: Every day | ORAL | 0 refills | Status: DC
  Filled 2022-01-24: qty 180, 90d supply, fill #0
  Filled 2022-02-21 (×2): qty 90, 45d supply, fill #0
  Filled 2022-02-23: qty 60, 30d supply, fill #0
  Filled 2022-03-30: qty 180, 90d supply, fill #0

## 2022-02-03 ENCOUNTER — Other Ambulatory Visit: Payer: Self-pay

## 2022-02-21 ENCOUNTER — Other Ambulatory Visit: Payer: Self-pay

## 2022-02-23 ENCOUNTER — Other Ambulatory Visit: Payer: Self-pay

## 2022-03-11 ENCOUNTER — Other Ambulatory Visit: Payer: Self-pay

## 2022-03-30 ENCOUNTER — Other Ambulatory Visit: Payer: Self-pay

## 2022-03-31 ENCOUNTER — Other Ambulatory Visit: Payer: Self-pay

## 2022-04-06 ENCOUNTER — Ambulatory Visit: Payer: Commercial Managed Care - HMO | Admitting: Internal Medicine

## 2022-04-20 ENCOUNTER — Ambulatory Visit: Payer: Commercial Managed Care - HMO | Admitting: Family

## 2022-04-20 ENCOUNTER — Telehealth: Payer: Self-pay | Admitting: Family

## 2022-04-20 NOTE — Telephone Encounter (Signed)
Patient did not show for his Heart Failure Clinic appointment on 04/20/22.

## 2022-05-02 ENCOUNTER — Ambulatory Visit: Payer: Self-pay | Attending: Internal Medicine | Admitting: Medical

## 2022-05-02 NOTE — Progress Notes (Deleted)
Cardiology Office Note:    Date:  05/02/2022   ID:  Gregory Mckenzie, DOB 01/25/1976, MRN [id number]  PCP:  Patient, No Pcp Per  [place] Cardiologist:  Nelva Bush, MD  [place] Electrophysiologist:  None   Referring MD: No ref. provider found   Chief Complaint: 69-month follow-up  History of Present Illness:    Gregory Mckenzie is a 47 y.o. 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   He was admitted to the hospital in 03/2020 with volume overload and uncontrolled hypertension in the 180s over 100s.  Echo during that admission demonstrated an EF of 40 to 45%, global hypokinesis, severe concentric LVH, grade 2 diastolic dysfunction, normal PASP, severe biatrial enlargement, trivial mitral regurgitation, and an estimated right atrial pressure of 8 mmHg.  Lexiscan MPI was undertaken during that admission which showed no evidence of ischemia with extensive wall motion abnormalities including apparent dyskinesia involving the basilar aspect of the inferior wall of the LV with an EF of 31%.  He was diuresed and started on GDMT with symptomatic improvement.  He was also noted to have a brief episode of paroxysmal atrial flutter and was initiated on Xarelto.  He converted to sinus rhythm during the admission.  He was subsequently evaluated in our transitions of care clinic in 04/2020 and scheduled for a diagnostic R/LHC in 04/2020, which demonstrated nonobstructive CAD and mildly elevated filling pressures with mild pulmonary venous hypertension.  He was lost to follow-up until readmission in 04/2021 with acute on chronic combined CHF.  High-sensitivity troponin peaking at 67.  BNP 990.  He was diuresed 26 L with subsequent improvement in symptoms and AKI leading to the holding of diuresis and GDMT.  With  diuresis, BNP improved to 380.  Echo demonstrated a stable cardiomyopathy with an EF of 40 to 45%, global hypokinesis, mild LVH, grade 2 diastolic dysfunction, moderately reduced RV systolic function with mildly enlarged RV cavity size, moderate biatrial enlargement, mild mitral regurgitation, moderate to severe tricuspid regurgitation, and an estimated right atrial pressure of 3 mmHg.  Hospital course was subsequently complicated by acute respiratory distress requiring mechanical ventilation with concern for HAP with improvement following antibiotic therapy. Repeat limited echo 09/2021 showed LVEF 55-60%, moderate LVH.   Patient saw advanced heart failure team in November 2023 and weight was up 15 pounds.  Labs were drawn.  Patient was waiting to get CPAP equipment.  Today, Weight CPAP  Past Medical History:  Diagnosis Date   Atrial flutter (HCC)    Chronic combined systolic and diastolic CHF (congestive heart failure) ([place])    Diabetes mellitus without complication ([place])    Hyperlipidemia    Hypertension    Obesity     Past Surgical History:  Procedure Laterality Date   RIGHT/LEFT HEART CATH AND CORONARY ANGIOGRAPHY N/A 05/08/2020   Procedure: RIGHT/LEFT HEART CATH AND CORONARY ANGIOGRAPHY;  Surgeon: Larey Dresser, MD;  Location: [place];  Service: Cardiovascular;  Laterality: N/A;    Current Medications: No outpatient medications have been marked as taking for the 05/02/22 encounter (Appointment) with Kathlen Mody, Aariona Momon H, PA-C.     Allergies:   Patient has no known allergies.   Social History   Socioeconomic History   Marital status: Single    Spouse name: Not on file  Number of children: Not on file   Years of education: Not on file   Highest education level: Not on file  Occupational History   Not on file  Tobacco Use   Smoking status: Former    Packs/day: 1.00    Years: 27.00    Additional pack years: 0.00    Total pack years: 27.00    Types: Cigarettes     Quit date: 04/06/2020    Years since quitting: 2.0   Smokeless tobacco: Never  Vaping Use   Vaping Use: Never used  Substance and Sexual Activity   Alcohol use: Not Currently   Drug use: Yes    Frequency: 7.0 times per week    Types: Marijuana    Comment: daily   Sexual activity: Yes    Partners: Female    Birth control/protection: None  Other Topics Concern   Not on file  Social History Narrative   Not on file   Social Determinants of Health   Financial Resource Strain: Low Risk  (04/09/2020)   Overall Financial Resource Strain (CARDIA)    Difficulty of Paying Living Expenses: Not very hard  Food Insecurity: No Food Insecurity (04/09/2020)   Hunger Vital Sign    Worried About Running Out of Food in the Last Year: Never true    Ran Out of Food in the Last Year: Never true  Transportation Needs: No Transportation Needs (04/09/2020)   PRAPARE - Hydrologist (Medical): No    Lack of Transportation (Non-Medical): No  Physical Activity: Inactive (04/09/2020)   Exercise Vital Sign    Days of Exercise per Week: 0 days    Minutes of Exercise per Session: 0 min  Stress: Not on file  Social Connections: Not on file     Family History: The patient's ***family history includes Hypertension in his brother and father; Pulmonary embolism in his brother.  ROS:   Please see the history of present illness.    *** All other systems reviewed and are negative.  EKGs/Labs/Other Studies Reviewed:    The following studies were reviewed today: ***  EKG:  EKG is *** ordered today.  The ekg ordered today demonstrates ***  Recent Labs: 05/20/2021: TSH 0.758 05/21/2021: ALT 19; B Natriuretic Peptide 380.5 05/26/2021: Magnesium 2.3 05/28/2021: Hemoglobin 17.3; Platelets 216 12/21/2021: BUN 24; Creatinine, Ser 1.62; Potassium 3.6; Sodium 138  Recent Lipid Panel    Component Value Date/Time   CHOL 139 04/07/2020 2041   TRIG 45 05/23/2021 0606   HDL 35 (L) 04/07/2020  2041   CHOLHDL 4.0 04/07/2020 2041   VLDL 11 04/07/2020 2041   LDLCALC 93 04/07/2020 2041   LDLDIRECT 91.5 07/16/2021 1137     Risk Assessment/Calculations:   {Does this patient have ATRIAL FIBRILLATION?:[id number]}   Physical Exam:    VS:  There were no vitals taken for this visit.    Wt Readings from Last 3 Encounters:  12/21/21 (!) 303 lb (137.4 kg)  12/03/21 (!) 300 lb 6.4 oz (136.3 kg)  08/27/21 288 lb (130.6 kg)     GEN: *** Well nourished, well developed in no acute distress HEENT: Normal NECK: No JVD; No carotid bruits LYMPHATICS: No lymphadenopathy CARDIAC: ***RRR, no murmurs, rubs, gallops RESPIRATORY:  Clear to auscultation without rales, wheezing or rhonchi  ABDOMEN: Soft, non-tender, non-distended MUSCULOSKELETAL:  No edema; No deformity  SKIN: Warm and dry NEUROLOGIC:  Alert and oriented x 3 PSYCHIATRIC:  Normal affect   ASSESSMENT:    No  diagnosis found. PLAN:    In order of problems listed above:  ***  Disposition: Follow up {follow up:15908} with ***   Shared Decision Making/Informed Consent   {Are you ordering a CV Procedure (e.g. stress test, cath, DCCV, TEE, etc)?   Press F2        :[id number]    Signed, Alfonsa Vaile Arlyss Repress  05/02/2022 7:41 AM    [facility]

## 2022-09-19 ENCOUNTER — Other Ambulatory Visit: Payer: Self-pay | Admitting: Family

## 2022-09-19 ENCOUNTER — Telehealth: Payer: Self-pay

## 2022-09-19 ENCOUNTER — Other Ambulatory Visit: Payer: Self-pay | Admitting: Cardiovascular Disease

## 2022-09-19 ENCOUNTER — Other Ambulatory Visit: Payer: Self-pay | Admitting: Medical

## 2022-09-19 ENCOUNTER — Other Ambulatory Visit: Payer: Self-pay

## 2022-09-19 NOTE — Telephone Encounter (Signed)
Please contact pt for future appointment. Pt overdue for 6 month f/u. Pt needing refills. 

## 2022-09-19 NOTE — Telephone Encounter (Signed)
Error

## 2022-09-20 ENCOUNTER — Other Ambulatory Visit: Payer: Self-pay

## 2022-09-20 MED FILL — Dapagliflozin Propanediol Tab 10 MG (Base Equivalent): ORAL | 30 days supply | Qty: 30 | Fill #0 | Status: CN

## 2022-09-20 MED FILL — Rivaroxaban Tab 20 MG: ORAL | 30 days supply | Qty: 30 | Fill #0 | Status: AC

## 2022-09-20 MED FILL — Atorvastatin Calcium Tab 40 MG (Base Equivalent): ORAL | 30 days supply | Qty: 60 | Fill #0 | Status: AC

## 2022-09-20 MED FILL — Carvedilol Tab 6.25 MG: ORAL | 30 days supply | Qty: 60 | Fill #0 | Status: AC

## 2022-09-20 MED FILL — Spironolactone Tab 25 MG: ORAL | 30 days supply | Qty: 30 | Fill #0 | Status: AC

## 2022-09-20 MED FILL — Torsemide Tab 20 MG: ORAL | 30 days supply | Qty: 30 | Fill #0 | Status: AC

## 2022-09-21 ENCOUNTER — Telehealth: Payer: Self-pay

## 2022-09-21 ENCOUNTER — Other Ambulatory Visit: Payer: Self-pay

## 2022-09-21 ENCOUNTER — Telehealth (HOSPITAL_COMMUNITY): Payer: Self-pay

## 2022-09-21 ENCOUNTER — Other Ambulatory Visit (HOSPITAL_COMMUNITY): Payer: Self-pay

## 2022-09-21 MED ORDER — EMPAGLIFLOZIN 10 MG PO TABS
10.0000 mg | ORAL_TABLET | Freq: Every day | ORAL | 3 refills | Status: DC
Start: 2022-09-21 — End: 2023-08-24
  Filled 2022-09-21 – 2022-09-23 (×4): qty 30, 30d supply, fill #0
  Filled 2022-12-30 – 2023-01-25 (×2): qty 30, 30d supply, fill #1
  Filled 2023-03-06 – 2023-06-23 (×2): qty 30, 30d supply, fill #2
  Filled 2023-07-23: qty 30, 30d supply, fill #3

## 2022-09-21 NOTE — Telephone Encounter (Signed)
Fax received from [facility] [phone] Regarding preferred product: Jardiance.  $0.00 covered  patient charge for Farxiga 10 mg $743.64.  New Rx Jardiance 10 mg  completed  Clarisa Kindred FNP aware.

## 2022-09-21 NOTE — Telephone Encounter (Signed)
Patient Advocate Encounter  Prior authorization for [place] has been submitted and approved. Test billing returns error message, pt needs to contact insurance. Informed patient by phone.  Key: [id number] Effective: 09/21/2022 to 09/21/2023  Burnell Blanks, CPhT Rx Patient Advocate Phone: [phone]

## 2022-09-23 ENCOUNTER — Other Ambulatory Visit: Payer: Self-pay

## 2022-09-28 ENCOUNTER — Encounter: Payer: Self-pay | Admitting: Family

## 2022-09-28 ENCOUNTER — Other Ambulatory Visit: Payer: Self-pay

## 2022-09-28 ENCOUNTER — Encounter: Payer: Self-pay | Admitting: Pharmacy Technician

## 2022-09-28 ENCOUNTER — Ambulatory Visit: Payer: Self-pay | Attending: Family | Admitting: Family

## 2022-09-28 VITALS — BP 148/110 | HR 103 | Ht 66.0 in | Wt 311.0 lb

## 2022-09-28 DIAGNOSIS — I2510 Atherosclerotic heart disease of native coronary artery without angina pectoris: Secondary | ICD-10-CM | POA: Insufficient documentation

## 2022-09-28 DIAGNOSIS — E669 Obesity, unspecified: Secondary | ICD-10-CM | POA: Insufficient documentation

## 2022-09-28 DIAGNOSIS — I272 Pulmonary hypertension, unspecified: Secondary | ICD-10-CM | POA: Insufficient documentation

## 2022-09-28 DIAGNOSIS — E119 Type 2 diabetes mellitus without complications: Secondary | ICD-10-CM | POA: Insufficient documentation

## 2022-09-28 DIAGNOSIS — I081 Rheumatic disorders of both mitral and tricuspid valves: Secondary | ICD-10-CM | POA: Insufficient documentation

## 2022-09-28 DIAGNOSIS — I110 Hypertensive heart disease with heart failure: Secondary | ICD-10-CM | POA: Insufficient documentation

## 2022-09-28 DIAGNOSIS — N1831 Chronic kidney disease, stage 3a: Secondary | ICD-10-CM

## 2022-09-28 DIAGNOSIS — I4892 Unspecified atrial flutter: Secondary | ICD-10-CM

## 2022-09-28 DIAGNOSIS — I5042 Chronic combined systolic (congestive) and diastolic (congestive) heart failure: Secondary | ICD-10-CM | POA: Insufficient documentation

## 2022-09-28 DIAGNOSIS — I5022 Chronic systolic (congestive) heart failure: Secondary | ICD-10-CM

## 2022-09-28 DIAGNOSIS — Z7901 Long term (current) use of anticoagulants: Secondary | ICD-10-CM | POA: Insufficient documentation

## 2022-09-28 DIAGNOSIS — I428 Other cardiomyopathies: Secondary | ICD-10-CM | POA: Insufficient documentation

## 2022-09-28 DIAGNOSIS — Z8249 Family history of ischemic heart disease and other diseases of the circulatory system: Secondary | ICD-10-CM | POA: Insufficient documentation

## 2022-09-28 DIAGNOSIS — I10 Essential (primary) hypertension: Secondary | ICD-10-CM

## 2022-09-28 DIAGNOSIS — N529 Male erectile dysfunction, unspecified: Secondary | ICD-10-CM | POA: Insufficient documentation

## 2022-09-28 DIAGNOSIS — Z87891 Personal history of nicotine dependence: Secondary | ICD-10-CM | POA: Insufficient documentation

## 2022-09-28 DIAGNOSIS — G4733 Obstructive sleep apnea (adult) (pediatric): Secondary | ICD-10-CM

## 2022-09-28 DIAGNOSIS — E1122 Type 2 diabetes mellitus with diabetic chronic kidney disease: Secondary | ICD-10-CM

## 2022-09-28 DIAGNOSIS — Z79899 Other long term (current) drug therapy: Secondary | ICD-10-CM | POA: Insufficient documentation

## 2022-09-28 NOTE — Patient Instructions (Signed)
Pick up prescriptioms at pharmacy.  Go down to LOWER LEVEL (LL) to have blood work completed inside of [employer] office.  Follow up in 2 weeks

## 2022-09-28 NOTE — Progress Notes (Signed)
[facility] - HEART FAILURE CLINIC - PHARMACIST COUNSELING NOTE  Guideline-Directed Medical Therapy/Evidence Based Medicine  ACE/ARB/ARNI: Sacubitril-valsartan 24-26 mg twice daily Beta Blocker: Carvedilol 6.25 mg twice daily Aldosterone Antagonist: Spironolactone 25 mg daily Diuretic: Torsemide 20 mg daily SGLT2i: Empagliflozin 10 mg daily Patient is not currently taking any of the above medications because he did not have insurance for the months of June and July. Insurance has been reactivated as of August 1.  Adherence Assessment  Do you ever forget to take your medication? [] Yes [x] No  Do you ever skip doses due to side effects? [] Yes [x] No  Do you have trouble affording your medicines? [x] Yes [] No  Are you ever unable to pick up your medication due to transportation difficulties? [] Yes [x] No  Do you ever stop taking your medications because you don't believe they are helping? [] Yes [x] No  Do you check your weight daily? [x] Yes [] No   Adherence strategy: Pill box  Barriers to obtaining medications: Cost. Patient did not have insurance for the months of June and July. He takes multiple brand name medications which are expensive.  Vital signs: HR 103, BP 148/110, weight (pounds) 311 lbs ECHO: Date 05/2021, EF 55-60%, notes LV ventricle shows global hypokinesis and grade II diastolic dysfunction.     Latest Ref Rng & Units 12/21/2021   11:08 AM 07/16/2021   11:37 AM 06/15/2021   12:57 PM  BMP  Glucose 70 - 99 mg/dL 401  027  253   BUN 6 - 20 mg/dL 24  19  23    Creatinine 0.61 - 1.24 mg/dL 6.64  4.03  4.74   Sodium 135 - 145 mmol/L 138  137  136   Potassium 3.5 - 5.1 mmol/L 3.6  4.1  4.0   Chloride 98 - 111 mmol/L 104  100  98   CO2 22 - 32 mmol/L 27  30  29    Calcium 8.9 - 10.3 mg/dL 9.4  9.9  9.8     Past Medical History:  Diagnosis Date   Atrial flutter (HCC)    Chronic combined systolic and diastolic CHF (congestive heart failure) (HCC)     Diabetes mellitus without complication (HCC)    Hyperlipidemia    Hypertension    Obesity     ASSESSMENT 47 year old male who presents to the HF clinic for follow-up. Most recent ECHO from 09/2021 shows EF 55-60%, up from 40-45% in , meaning patient considered HFimpEF. However, given that patient has lapsed in taking medication, we will schedule new ECHO now and then again after dose titration.  Patient has insurance again as of August 1st. I confirmed with [facility] community pharmacy that he has refills available and he will be going there today to pick up fills of the medication.  Recent ED Visit (past 6 months): N/A  PLAN  CHF / HTN Restart Entresto 24/26 mg, Jardiance 10 mg, spironolactone 25 mg, torsemide 20 mg, and carvedilol 6.25 mg I counseled patient on Jardiance today because he was more familiar with Marcelline Deist Re-educated patient on importance of daily weights and keeping a BP log BMP today and again in two weeks Last ECHO 09/2021 >> new ECHO scheduled Up 8 lbs today from last visit. Patient will be picking up torsemide today.. We attempted to obtain ReDS reading today but were unsuccessful due to poor machine fit. DM 12/2021 A1c 6.6% Restart Jardiance Recommend new A1c in three months following Jardiance restart  Time spent: 25 minutes  Will M. Dareen Piano, PharmD  Clinical Pharmacist 09/28/2022 11:23 AM

## 2022-09-28 NOTE — Progress Notes (Signed)
PCP: none Primary Cardiologist: Yvonne Kendall, MD (last saw 10/23)  HPI:  [name] is a 47 y/o male with a history of DM, hyperlipidemia, sleep apnea (on CPAP), HTN, paroxysmal atrial flutter, DM2, obesity, polycythemia, possible obesity hypoventilation syndrome, previous tobacco use and chronic heart failure.   Admitted to the hospital in 03/2020 with volume overload and uncontrolled hypertension in the 180s over 100s. Echo during that admission demonstrated an EF of 40 to 45%, global hypokinesis, severe concentric LVH, grade 2 diastolic dysfunction, normal PASP, severe biatrial enlargement, trivial mitral regurgitation, and an estimated right atrial pressure of 8 mmHg.  Lexiscan MPI was undertaken during that admission which showed no evidence of ischemia with extensive wall motion abnormalities including apparent dyskinesia involving the basilar aspect of the inferior wall of the LV with an EF of 31%.  He was diuresed and started on GDMT with symptomatic improvement. He was also noted to have a brief episode of paroxysmal atrial flutter and was initiated on Xarelto. He converted to sinus rhythm during the admission   He was lost to follow-up until readmission in 04/2021 with acute on chronic combined CHF.  High-sensitivity troponin peaking at 67.  BNP 990.  He was diuresed 26 L with subsequent improvement in symptoms and AKI leading to the holding of diuresis and GDMT.  With diuresis, BNP improved to 380.  Echo demonstrated a stable cardiomyopathy with an EF of 40 to 45%, global hypokinesis, mild LVH, grade 2 diastolic dysfunction, moderately reduced RV systolic function with mildly enlarged RV cavity size, moderate biatrial enlargement, mild mitral regurgitation, moderate to severe tricuspid regurgitation, and an estimated right atrial pressure of 3 mmHg. Hospital course was subsequently complicated by acute respiratory distress requiring mechanical ventilation with concern for HAP with improvement  following antibiotic therapy.  HTN runs in his family, father and brother had severe hypertension. Brother had a pulmonary embolism at age 77. Thinks his brother also had "thick blood" polycythemia.    Echo 05/15/21: EF of 40-45% along with mild LVH, moderate LAE, mild MR & moderate/severe TR.   LHC done 05/08/20 showed: Prox RCA lesion is 30% stenosed. Mid RCA lesion is 30% stenosed. Ost LAD lesion is 30% stenosed. Prox LAD lesion is 30% stenosed.  1. Mildly elevated filling pressures.  2. Mild pulmonary venous hypertension.  3. Nonobstructive CAD.   Nonischemic cardiomyopathy.   He presents today for a HF f/u visit with a chief complaint of minimal SOB with moderate exertion. Chronic in nature although has worsened some since being out of his medications. Has associated fatigue and productive cough along with this. Denies fatigue, chest pain, palpitations, abdominal distention, pedal edema, dizziness, weight gain or difficulty sleeping. Biggest complaint he has today is of his erectile dysfunction that has worsened over the last several months. Has occasional dysuria along with this.   Wears oxygen 4L at night with CPAP but he doesn't know the settings of his CPAP.   Lost his insurance 02/24 and once he ran out of his medications, he couldn't afford to get them filled. He just got insurance 2 weeks ago (09/15/22) and is going to pick up all his medications today. [facility] community pharmacy called to confirm that his meds all have active prescriptions (which they confirmed).  ROS: All systems negative except as listed in HPI, PMH and Problem List.  SH:  Social History   Socioeconomic History   Marital status: Single    Spouse name: Not on file   Number of children: Not  on file   Years of education: Not on file   Highest education level: Not on file  Occupational History   Not on file  Tobacco Use   Smoking status: Former    Current packs/day: 0.00    Average packs/day: 1 pack/day for  27.0 years (27.0 ttl pk-yrs)    Types: Cigarettes    Start date: 04/06/1993    Quit date: 04/06/2020    Years since quitting: 2.4   Smokeless tobacco: Never  Vaping Use   Vaping status: Never Used  Substance and Sexual Activity   Alcohol use: Not Currently   Drug use: Yes    Frequency: 7.0 times per week    Types: Marijuana    Comment: daily   Sexual activity: Yes    Partners: Female    Birth control/protection: None  Other Topics Concern   Not on file  Social History Narrative   Not on file   Social Determinants of Health   Financial Resource Strain: Low Risk  (04/09/2020)   Overall Financial Resource Strain (CARDIA)    Difficulty of Paying Living Expenses: Not very hard  Food Insecurity: No Food Insecurity (04/09/2020)   Hunger Vital Sign    Worried About Running Out of Food in the Last Year: Never true    Ran Out of Food in the Last Year: Never true  Transportation Needs: No Transportation Needs (04/09/2020)   PRAPARE - Administrator, Civil Service (Medical): No    Lack of Transportation (Non-Medical): No  Physical Activity: Inactive (04/09/2020)   Exercise Vital Sign    Days of Exercise per Week: 0 days    Minutes of Exercise per Session: 0 min  Stress: Not on file  Social Connections: Not on file  Intimate Partner Violence: Not on file    FH:  Family History  Problem Relation Age of Onset   Hypertension Father    Hypertension Brother    Pulmonary embolism Brother     Past Medical History:  Diagnosis Date   Atrial flutter (HCC)    Chronic combined systolic and diastolic CHF (congestive heart failure) (HCC)    Diabetes mellitus without complication (HCC)    Hyperlipidemia    Hypertension    Obesity     Current Outpatient Medications  Medication Sig Dispense Refill   atorvastatin (LIPITOR) 40 MG tablet Take 2 tablets (80 mg total) by mouth at bedtime. 180 tablet 0   carvedilol (COREG) 6.25 MG tablet Take 1 tablet (6.25 mg total) by mouth 2  (two) times daily with a meal. 180 tablet 3   empagliflozin (JARDIANCE) 10 MG TABS tablet Take 1 tablet (10 mg total) by mouth daily before breakfast. 30 tablet 3   OXYGEN Inhale 4 L into the lungs at bedtime.     sacubitril-valsartan (ENTRESTO) 24-26 MG Take 1 tablet by mouth 2 (two) times daily. 60 tablet 11   spironolactone (ALDACTONE) 25 MG tablet Take 1 tablet (25 mg total) by mouth once daily. 90 tablet 3   torsemide (DEMADEX) 20 MG tablet Take 1 tablet (20 mg total) by mouth once daily. 90 tablet 3   rivaroxaban (XARELTO) 20 MG TABS tablet Take 1 tablet (20 mg total) by mouth daily with supper. 90 tablet 0   No current facility-administered medications for this visit.   Vitals:   09/28/22 1014 09/28/22 1120  BP: (!) 148/110   Pulse: (!) 103   SpO2: (!) 86% 95%  Weight: (!) 311 lb (141.1 kg)  Height: 5\' 6"  (1.676 m)    Wt Readings from Last 3 Encounters:  09/28/22 (!) 311 lb (141.1 kg)  12/21/21 (!) 303 lb (137.4 kg)  12/03/21 (!) 300 lb 6.4 oz (136.3 kg)   Lab Results  Component Value Date   CREATININE 1.62 (H) 12/21/2021   CREATININE 1.24 07/16/2021   CREATININE 1.46 (H) 06/15/2021   PHYSICAL EXAM:  General:  Well appearing. No resp difficulty HEENT: normal Neck: supple. JVP flat. No lymphadenopathy or thryomegaly appreciated. Cor: PMI normal. Regular rhythm, tachycardia. No rubs, gallops or murmurs. Lungs: clear Abdomen: soft, nontender, nondistended. No hepatosplenomegaly. No bruits or masses.  Extremities: no cyanosis, clubbing, rash, 1+ pitting edema R>L Neuro: alert & oriented x3, cranial nerves grossly intact. Moves all 4 extremities w/o difficulty. Affect pleasant.   ECG: ST with HR 105 (personally reviewed)   ASSESSMENT & PLAN:  1: NICM with mildly reduced ejection fraction- - suspect due to HTN as cath showed nonobstructive CAD - NYHA class II - euvolemic today - weighing daily; reminded to call for an overnight weight gain of > 2 pounds or a weekly  weight gain of > 5 pounds - weight up 8 pounds from last visit here 9 months ago - echo 05/15/21: EF of 40-45% along with mild LVH, moderate LAE, mild MR & moderate/severe TR.  - have scheduled updated echo for 10/20/22 - EKG today: ST with HR 105 - unable to perform ReDs due to chest wall size - LHC done 05/08/20 showed: Prox RCA lesion is 30% stenosed. Mid RCA lesion is 30% stenosed. Ost LAD lesion is 30% stenosed. Prox LAD lesion is 30% stenosed.  1. Mildly elevated filling pressures.  2. Mild pulmonary venous hypertension.  3. Nonobstructive CAD.   - not adding salt to his food and is reading food labels for sodium content - resume carvedilol 6.25mg  BID - resume jardiance 10mg  daily - resume entresto 24/26mg  BID - resume spironolactone 25mg  daily - resume torsemide 20mg  daily - BMP today - saw [facility] provider 03/22 - BNP 05/21/21 was 380.5 - PharmD reconciled medications with the patient  2: HTN- - BP 148/110; resuming meds per above - was NS for NP appt at [facility] 11/23; this has been r/s for 10/24 - BMP 07/16/21 reviewed and showed sodium 137, potassium 4.1, creatinine 1.24 and GFR >60 - BMP today & than again at next visit  3: DM- - A1c 05/14/21 was 6.6% - currently diet controlled - A1c today - resume atorvastatin 80mg  daily  4: Paroxysmal atrial flutter- - saw cardiology Fransico Michael) 10/23; NS 03/24 as he didn't have any insurance - resume xarelto 20mg  daily - EKG today is ST  5: Obstructive sleep apnea- - wearing CPAP nightly but he doesn't know the settings - unclear as to when a sleep study was done or who ordered his CPAP - pulmonology referral placed today - wearing oxygen at 4L PRN during the day & at bedtime  Advised that he would need to discuss his ED with his PCP in a couple of months or make urology appointment.   Return in 2 weeks, sooner if needed.

## 2022-09-29 LAB — BASIC METABOLIC PANEL
BUN/Creatinine Ratio: 14 (ref 9–20)
BUN: 18 mg/dL (ref 6–24)
CO2: 26 mmol/L (ref 20–29)
Calcium: 9.5 mg/dL (ref 8.7–10.2)
Chloride: 101 mmol/L (ref 96–106)
Creatinine, Ser: 1.29 mg/dL — ABNORMAL HIGH (ref 0.76–1.27)
Glucose: 98 mg/dL (ref 70–99)
Potassium: 4.4 mmol/L (ref 3.5–5.2)
Sodium: 142 mmol/L (ref 134–144)
eGFR: 68 mL/min/{1.73_m2} (ref >59)

## 2022-09-29 LAB — HEMOGLOBIN A1C
Est. average glucose Bld gHb Est-mCnc: 154 mg/dL
Hgb A1c MFr Bld: 7 % — ABNORMAL HIGH (ref 4.8–5.6)

## 2022-09-30 ENCOUNTER — Other Ambulatory Visit: Payer: Self-pay

## 2022-10-06 ENCOUNTER — Other Ambulatory Visit: Payer: Self-pay

## 2022-10-12 ENCOUNTER — Other Ambulatory Visit: Payer: Self-pay

## 2022-10-12 ENCOUNTER — Encounter: Payer: Self-pay | Admitting: Family

## 2022-10-12 ENCOUNTER — Ambulatory Visit: Payer: Self-pay | Attending: Family | Admitting: Family

## 2022-10-12 ENCOUNTER — Encounter: Payer: Self-pay | Admitting: Pharmacist

## 2022-10-12 VITALS — BP 152/89 | HR 80 | Ht 66.0 in | Wt 307.0 lb

## 2022-10-12 DIAGNOSIS — I10 Essential (primary) hypertension: Secondary | ICD-10-CM

## 2022-10-12 DIAGNOSIS — G4733 Obstructive sleep apnea (adult) (pediatric): Secondary | ICD-10-CM

## 2022-10-12 DIAGNOSIS — Z87891 Personal history of nicotine dependence: Secondary | ICD-10-CM | POA: Insufficient documentation

## 2022-10-12 DIAGNOSIS — I272 Pulmonary hypertension, unspecified: Secondary | ICD-10-CM | POA: Insufficient documentation

## 2022-10-12 DIAGNOSIS — I484 Atypical atrial flutter: Secondary | ICD-10-CM | POA: Insufficient documentation

## 2022-10-12 DIAGNOSIS — I110 Hypertensive heart disease with heart failure: Secondary | ICD-10-CM | POA: Insufficient documentation

## 2022-10-12 DIAGNOSIS — I4892 Unspecified atrial flutter: Secondary | ICD-10-CM

## 2022-10-12 DIAGNOSIS — Z6841 Body Mass Index (BMI) 40.0 and over, adult: Secondary | ICD-10-CM | POA: Insufficient documentation

## 2022-10-12 DIAGNOSIS — E119 Type 2 diabetes mellitus without complications: Secondary | ICD-10-CM | POA: Insufficient documentation

## 2022-10-12 DIAGNOSIS — I2510 Atherosclerotic heart disease of native coronary artery without angina pectoris: Secondary | ICD-10-CM | POA: Insufficient documentation

## 2022-10-12 DIAGNOSIS — I5022 Chronic systolic (congestive) heart failure: Secondary | ICD-10-CM

## 2022-10-12 DIAGNOSIS — E669 Obesity, unspecified: Secondary | ICD-10-CM | POA: Insufficient documentation

## 2022-10-12 DIAGNOSIS — I428 Other cardiomyopathies: Secondary | ICD-10-CM | POA: Insufficient documentation

## 2022-10-12 DIAGNOSIS — E1122 Type 2 diabetes mellitus with diabetic chronic kidney disease: Secondary | ICD-10-CM

## 2022-10-12 DIAGNOSIS — E785 Hyperlipidemia, unspecified: Secondary | ICD-10-CM | POA: Insufficient documentation

## 2022-10-12 DIAGNOSIS — N1831 Chronic kidney disease, stage 3a: Secondary | ICD-10-CM

## 2022-10-12 MED ORDER — SACUBITRIL-VALSARTAN 49-51 MG PO TABS
1.0000 | ORAL_TABLET | Freq: Two times a day (BID) | ORAL | 5 refills | Status: DC
  Filled 2022-10-12 – 2023-01-25 (×4): qty 60, 30d supply, fill #0

## 2022-10-12 NOTE — Patient Instructions (Addendum)
Go DOWN to LOWER LEVEL (LL) inside of [facility] to get your blood work completed  Continue taking your CURRENT ENTRESTO 24-26 MG (2 tabs twice daily)  Once you're done with that one, pick up your new dosage ENTRESTO 49-51 MG and (take 1 tab twice daily)  Follow up in 2 weeks

## 2022-10-12 NOTE — Progress Notes (Signed)
PCP: none Primary Cardiologist: Yvonne Kendall, MD (last saw 10/23)  HPI:  Gregory Mckenzie is a 47 y/o male with a history of DM, hyperlipidemia, sleep apnea (on CPAP), HTN, paroxysmal atrial flutter, DM2, obesity, polycythemia, possible obesity hypoventilation syndrome, previous tobacco use and chronic heart failure.   Admitted to the hospital in 03/2020 with volume overload and uncontrolled hypertension in the 180s over 100s. Echo during that admission demonstrated an EF of 40 to 45%, global hypokinesis, severe concentric LVH, grade 2 diastolic dysfunction, normal PASP, severe biatrial enlargement, trivial mitral regurgitation, and an estimated right atrial pressure of 8 mmHg.  Lexiscan MPI was undertaken during that admission which showed no evidence of ischemia with extensive wall motion abnormalities including apparent dyskinesia involving the basilar aspect of the inferior wall of the LV with an EF of 31%.  He was diuresed and started on GDMT with symptomatic improvement. He was also noted to have a brief episode of paroxysmal atrial flutter and was initiated on Xarelto. He converted to sinus rhythm during the admission   He was lost to follow-up until readmission in 04/2021 with acute on chronic combined CHF.  High-sensitivity troponin peaking at 67.  BNP 990.  He was diuresed 26 L with subsequent improvement in symptoms and AKI leading to the holding of diuresis and GDMT.  With diuresis, BNP improved to 380.  Echo demonstrated a stable cardiomyopathy with an EF of 40 to 45%, global hypokinesis, mild LVH, grade 2 diastolic dysfunction, moderately reduced RV systolic function with mildly enlarged RV cavity size, moderate biatrial enlargement, mild mitral regurgitation, moderate to severe tricuspid regurgitation, and an estimated right atrial pressure of 3 mmHg. Hospital course was subsequently complicated by acute respiratory distress requiring mechanical ventilation with concern for HAP with improvement  following antibiotic therapy.  HTN runs in his family, father and brother had severe hypertension. Brother had a pulmonary embolism at age 21. Thinks his brother also had "thick blood" polycythemia.    Echo 05/15/21: EF of 40-45% along with mild LVH, moderate LAE, mild Gregory & moderate/severe TR.   LHC done 05/08/20 showed: Prox RCA lesion is 30% stenosed. Mid RCA lesion is 30% stenosed. Ost LAD lesion is 30% stenosed. Prox LAD lesion is 30% stenosed.  1. Mildly elevated filling pressures.  2. Mild pulmonary venous hypertension.  3. Nonobstructive CAD.   Nonischemic cardiomyopathy.   He presents today for a HF f/u visit with a chief complaint of minimal SOB with moderate exertion. Chronic in nature. Has associated fatigue (improving), intermittent chest pain with "too much exertion", dizziness at times and slight pedal edema along with this. Denies cough, palpitations, abdominal distention, difficulty sleeping or weight gain. Overall he says that he feels "much better"  [place] the track early in the morning. Wears oxygen 4L at night with CPAP but he doesn't know the settings of his CPAP.    ROS: All systems negative except as listed in HPI, PMH and Problem List.  SH:  Social History   Socioeconomic History   Marital status: Single    Spouse name: Not on file   Number of children: Not on file   Years of education: Not on file   Highest education level: Not on file  Occupational History   Not on file  Tobacco Use   Smoking status: Former    Current packs/day: 0.00    Average packs/day: 1 pack/day for 27.0 years (27.0 ttl pk-yrs)    Types: Cigarettes    Start date:  04/06/1993    Quit date: 04/06/2020    Years since quitting: 2.5   Smokeless tobacco: Never  Vaping Use   Vaping status: Never Used  Substance and Sexual Activity   Alcohol use: Not Currently   Drug use: Yes    Frequency: 7.0 times per week    Types: Marijuana    Comment: daily   Sexual activity: Yes     Partners: Female    Birth control/protection: None  Other Topics Concern   Not on file  Social History Narrative   Not on file   Social Determinants of Health   Financial Resource Strain: Low Risk  (04/09/2020)   Overall Financial Resource Strain (CARDIA)    Difficulty of Paying Living Expenses: Not very hard  Food Insecurity: No Food Insecurity (04/09/2020)   Hunger Vital Sign    Worried About Running Out of Food in the Last Year: Never true    Ran Out of Food in the Last Year: Never true  Transportation Needs: No Transportation Needs (04/09/2020)   PRAPARE - Administrator, Civil Service (Medical): No    Lack of Transportation (Non-Medical): No  Physical Activity: Inactive (04/09/2020)   Exercise Vital Sign    Days of Exercise per Week: 0 days    Minutes of Exercise per Session: 0 min  Stress: Not on file  Social Connections: Not on file  Intimate Partner Violence: Not on file    FH:  Family History  Problem Relation Age of Onset   Hypertension Father    Hypertension Brother    Pulmonary embolism Brother     Past Medical History:  Diagnosis Date   Atrial flutter (HCC)    Chronic combined systolic and diastolic CHF (congestive heart failure) (HCC)    Diabetes mellitus without complication (HCC)    Hyperlipidemia    Hypertension    Obesity    Sleep apnea, obstructive     Current Outpatient Medications  Medication Sig Dispense Refill   atorvastatin (LIPITOR) 40 MG tablet Take 2 tablets (80 mg total) by mouth at bedtime. 180 tablet 0   carvedilol (COREG) 6.25 MG tablet Take 1 tablet (6.25 mg total) by mouth 2 (two) times daily with a meal. 180 tablet 3   empagliflozin (JARDIANCE) 10 MG TABS tablet Take 1 tablet (10 mg total) by mouth daily before breakfast. 30 tablet 3   OXYGEN Inhale 4 L into the lungs at bedtime.     rivaroxaban (XARELTO) 20 MG TABS tablet Take 1 tablet (20 mg total) by mouth daily with supper. 90 tablet 0   sacubitril-valsartan  (ENTRESTO) 24-26 MG Take 1 tablet by mouth 2 (two) times daily. 60 tablet 11   spironolactone (ALDACTONE) 25 MG tablet Take 1 tablet (25 mg total) by mouth once daily. 90 tablet 3   torsemide (DEMADEX) 20 MG tablet Take 1 tablet (20 mg total) by mouth once daily. 90 tablet 3   No current facility-administered medications for this visit.   Vitals:   10/12/22 0930  BP: (!) 152/89  Pulse: 80  SpO2: 100%  Weight: (!) 307 lb (139.3 kg)  Height: 5\' 6"  (1.676 m)   Wt Readings from Last 3 Encounters:  10/12/22 (!) 307 lb (139.3 kg)  09/28/22 (!) 311 lb (141.1 kg)  12/21/21 (!) 303 lb (137.4 kg)   Lab Results  Component Value Date   CREATININE 1.29 (H) 09/28/2022   CREATININE 1.62 (H) 12/21/2021   CREATININE 1.24 07/16/2021   PHYSICAL EXAM:  General:  Well appearing. No resp difficulty HEENT: normal Neck: supple. JVP flat. No lymphadenopathy or thryomegaly appreciated. Cor: PMI normal. Regular rhythm, rate. No rubs, gallops or murmurs. Lungs: clear Abdomen: soft, nontender, nondistended. No hepatosplenomegaly. No bruits or masses.  Extremities: no cyanosis, clubbing, rash, 1+ pitting edema bilateral lower legs Neuro: alert & oriented x3, cranial nerves grossly intact. Moves all 4 extremities w/o difficulty. Affect pleasant.   ECG: not done   ASSESSMENT & PLAN:  1: NICM with mildly reduced ejection fraction- - suspect due to HTN as cath showed nonobstructive CAD - NYHA class II - euvolemic today - weighing daily; reminded to call for an overnight weight gain of > 2 pounds or a weekly weight gain of > 5 pounds - weight down 4 pounds from last visit here 2 weeks ago - echo 05/15/21: EF of 40-45% along with mild LVH, moderate LAE, mild Gregory & moderate/severe TR.  - has updated echo on 10/20/22 - LHC done 05/08/20 showed: Prox RCA lesion is 30% stenosed. Mid RCA lesion is 30% stenosed. Ost LAD lesion is 30% stenosed. Prox LAD lesion is 30% stenosed.  1. Mildly elevated filling  pressures.  2. Mild pulmonary venous hypertension.  3. Nonobstructive CAD.   - not adding salt to his food and is reading food labels for sodium content - continue carvedilol 6.25mg  BID - continue jardiance 10mg  daily - increase entresto to 49/51mg  BID - continue spironolactone 25mg  daily - continue torsemide 20mg  daily - BMP today & then again at next visit - saw [facility] provider 03/22 - get compression socks and put them on every morning with removal at bedtime; elevate legs when sitting for long periods of time - BNP 05/21/21 was 380.5 - PharmD reconciled medications with the patient  2: HTN- - BP 152/89 - was NS for NP appt at [facility] 11/23; this has been r/s for 10/24 - BMP 09/28/22 reviewed and showed sodium 142, potassium 4.4, creatinine 1.29 and GFR 68 - BMP today  3: DM- - A1c 09/28/22 was 7.0.6% - currently diet controlled - continue atorvastatin 80mg  daily - lipid panel next visit  4: Paroxysmal atrial flutter- - saw cardiology Fransico Michael) 10/23; NS 03/24 as he didn't have any insurance - continue xarelto 20mg  daily  5: Obstructive sleep apnea- - wearing CPAP nightly but he doesn't know the settings - unclear as to when a sleep study was done or who ordered his CPAP - has pulmonology appt in 10/24 - wearing oxygen at 4L PRN during the day & at bedtime   Return in 2 weeks, sooner if needed.

## 2022-10-13 LAB — BASIC METABOLIC PANEL
BUN/Creatinine Ratio: 10 (ref 9–20)
BUN: 16 mg/dL (ref 6–24)
CO2: 26 mmol/L (ref 20–29)
Calcium: 9.4 mg/dL (ref 8.7–10.2)
Chloride: 98 mmol/L (ref 96–106)
Creatinine, Ser: 1.61 mg/dL — ABNORMAL HIGH (ref 0.76–1.27)
Glucose: 85 mg/dL (ref 70–99)
Potassium: 4.9 mmol/L (ref 3.5–5.2)
Sodium: 143 mmol/L (ref 134–144)
eGFR: 52 mL/min/{1.73_m2} — ABNORMAL LOW (ref >59)

## 2022-10-20 ENCOUNTER — Ambulatory Visit
Admission: RE | Admit: 2022-10-20 | Discharge: 2022-10-20 | Disposition: A | Discharge status: Home / Self Care | Payer: Self-pay | Source: Ambulatory Visit | Attending: Family | Admitting: Family

## 2022-10-20 DIAGNOSIS — I110 Hypertensive heart disease with heart failure: Secondary | ICD-10-CM | POA: Insufficient documentation

## 2022-10-20 DIAGNOSIS — I5042 Chronic combined systolic (congestive) and diastolic (congestive) heart failure: Secondary | ICD-10-CM | POA: Insufficient documentation

## 2022-10-20 DIAGNOSIS — I5022 Chronic systolic (congestive) heart failure: Secondary | ICD-10-CM

## 2022-10-20 LAB — ECHOCARDIOGRAM COMPLETE
AR max vel: 2.43 cm2
AV Area VTI: 2.65 cm2
AV Area mean vel: 2.75 cm2
AV Mean grad: 2.5 mmHg
AV Peak grad: 4.4 mmHg
Ao pk vel: 1.05 m/s
Area-P 1/2: 4.41 cm2
S' Lateral: 3.4 cm

## 2022-10-20 NOTE — Progress Notes (Signed)
*  PRELIMINARY RESULTS* Echocardiogram 2D Echocardiogram has been performed.  Gregory Mckenzie 10/20/2022, 11:35 AM

## 2022-10-25 ENCOUNTER — Other Ambulatory Visit: Payer: Self-pay

## 2022-10-25 NOTE — Progress Notes (Unsigned)
PCP: none Primary Cardiologist: Yvonne Kendall, MD (last saw 10/23)  HPI:  [name] is a 47 y/o male with a history of DM, hyperlipidemia, sleep apnea (on CPAP), HTN, paroxysmal atrial flutter, DM2, obesity, polycythemia, possible obesity hypoventilation syndrome, previous tobacco use and chronic heart failure.   Admitted to the hospital in 03/2020 with volume overload and uncontrolled hypertension in the 180s over 100s. Echo during that admission demonstrated an EF of 40 to 45%, global hypokinesis, severe concentric LVH, grade 2 diastolic dysfunction, normal PASP, severe biatrial enlargement, trivial mitral regurgitation, and an estimated right atrial pressure of 8 mmHg.  Lexiscan MPI was undertaken during that admission which showed no evidence of ischemia with extensive wall motion abnormalities including apparent dyskinesia involving the basilar aspect of the inferior wall of the LV with an EF of 31%.  He was diuresed and started on GDMT with symptomatic improvement. He was also noted to have a brief episode of paroxysmal atrial flutter and was initiated on Xarelto. He converted to sinus rhythm during the admission   He was lost to follow-up until readmission in 04/2021 with acute on chronic combined CHF.  High-sensitivity troponin peaking at 67.  BNP 990.  He was diuresed 26 L with subsequent improvement in symptoms and AKI leading to the holding of diuresis and GDMT.  With diuresis, BNP improved to 380.  Echo demonstrated a stable cardiomyopathy with an EF of 40 to 45%, global hypokinesis, mild LVH, grade 2 diastolic dysfunction, moderately reduced RV systolic function with mildly enlarged RV cavity size, moderate biatrial enlargement, mild mitral regurgitation, moderate to severe tricuspid regurgitation, and an estimated right atrial pressure of 3 mmHg. Hospital course was subsequently complicated by acute respiratory distress requiring mechanical ventilation with concern for HAP with improvement  following antibiotic therapy.  HTN runs in his family, father and brother had severe hypertension. Brother had a pulmonary embolism at age 47. Thinks his brother also had "thick blood" polycythemia.    Echo 05/15/21: EF of 40-45% along with mild LVH, moderate LAE, mild MR & moderate/severe TR.  Echo 10/20/22: EF 55-60% with severe LVH, normal PA pressure of 23.1 mmHg  LHC done 05/08/20 showed: Prox RCA lesion is 30% stenosed. Mid RCA lesion is 30% stenosed. Ost LAD lesion is 30% stenosed. Prox LAD lesion is 30% stenosed.  1. Mildly elevated filling pressures.  2. Mild pulmonary venous hypertension.  3. Nonobstructive CAD.   Nonischemic cardiomyopathy.   He presents today for a HF f/u visit with a chief complaint of minimal SOB with moderate exertion. Chronic in nature. Has associated fatigue, intermittent chest pain, HR races at times, intermittent dizziness and difficulty sleeping along with this. Denies cough, abdominal distention, pedal edema or weight gain. He feels like he's not sleeping well because he's not able to wear his CPAP because it's currently not working correctly.   He does not have any insurance now so did not pick up the RX for entresto 49/51mg  although he's been taking his 24/26mg  dose as 2 tablets BID.  [place] track [place]. Wears oxygen 4L at night and PRN during the day. Is now wearing compression socks daily with removal at bedtime and feels like his swelling has improved.   ROS: All systems negative except as listed in HPI, PMH and Problem List.  SH:  Social History   Socioeconomic History   Marital status: Single    Spouse name: Not on file   Number of children: Not on  file   Years of education: Not on file   Highest education level: Not on file  Occupational History   Not on file  Tobacco Use   Smoking status: Former    Current packs/day: 0.00    Average packs/day: 1 pack/day for 27.0 years (27.0 ttl pk-yrs)    Types:  Cigarettes    Start date: 04/06/1993    Quit date: 04/06/2020    Years since quitting: 2.5   Smokeless tobacco: Never  Vaping Use   Vaping status: Never Used  Substance and Sexual Activity   Alcohol use: Not Currently   Drug use: Yes    Frequency: 7.0 times per week    Types: Marijuana    Comment: daily   Sexual activity: Yes    Partners: Female    Birth control/protection: None  Other Topics Concern   Not on file  Social History Narrative   Not on file   Social Determinants of Health   Financial Resource Strain: Low Risk  (04/09/2020)   Overall Financial Resource Strain (CARDIA)    Difficulty of Paying Living Expenses: Not very hard  Food Insecurity: No Food Insecurity (04/09/2020)   Hunger Vital Sign    Worried About Running Out of Food in the Last Year: Never true    Ran Out of Food in the Last Year: Never true  Transportation Needs: No Transportation Needs (04/09/2020)   PRAPARE - Administrator, Civil Service (Medical): No    Lack of Transportation (Non-Medical): No  Physical Activity: Inactive (04/09/2020)   Exercise Vital Sign    Days of Exercise per Week: 0 days    Minutes of Exercise per Session: 0 min  Stress: Not on file  Social Connections: Not on file  Intimate Partner Violence: Not on file    FH:  Family History  Problem Relation Age of Onset   Hypertension Father    Hypertension Brother    Pulmonary embolism Brother     Past Medical History:  Diagnosis Date   Atrial flutter (HCC)    Chronic combined systolic and diastolic CHF (congestive heart failure) (HCC)    Diabetes mellitus without complication (HCC)    Hyperlipidemia    Hypertension    Obesity    Sleep apnea, obstructive     Current Outpatient Medications  Medication Sig Dispense Refill   atorvastatin (LIPITOR) 40 MG tablet Take 2 tablets (80 mg total) by mouth at bedtime. 180 tablet 0   carvedilol (COREG) 6.25 MG tablet Take 1 tablet (6.25 mg total) by mouth 2 (two) times  daily with a meal. 180 tablet 3   empagliflozin (JARDIANCE) 10 MG TABS tablet Take 1 tablet (10 mg total) by mouth daily before breakfast. 30 tablet 3   OXYGEN Inhale 4 L into the lungs at bedtime.     rivaroxaban (XARELTO) 20 MG TABS tablet Take 1 tablet (20 mg total) by mouth daily with supper. 90 tablet 0   sacubitril-valsartan (ENTRESTO) 49-51 MG Take 1 tablet by mouth 2 (two) times daily. 60 tablet 5   spironolactone (ALDACTONE) 25 MG tablet Take 1 tablet (25 mg total) by mouth once daily. 90 tablet 3   torsemide (DEMADEX) 20 MG tablet Take 1 tablet (20 mg total) by mouth once daily. 90 tablet 3   No current facility-administered medications for this visit.   Vitals:   10/26/22 0936  BP: (!) 141/91  Pulse: 90  SpO2: (!) 88%  Weight: (!) 303 lb (137.4 kg)  Height: 5'  6" (1.676 m)   Wt Readings from Last 3 Encounters:  10/26/22 (!) 303 lb (137.4 kg)  10/12/22 (!) 307 lb (139.3 kg)  09/28/22 (!) 311 lb (141.1 kg)   Lab Results  Component Value Date   CREATININE 1.61 (H) 10/12/2022   CREATININE 1.29 (H) 09/28/2022   CREATININE 1.62 (H) 12/21/2021    PHYSICAL EXAM:  General:  Well appearing. No resp difficulty HEENT: normal Neck: supple. JVP flat. No lymphadenopathy or thryomegaly appreciated. Cor: PMI normal. Regular rhythm, rate. No rubs, gallops or murmurs. Lungs: clear Abdomen: soft, nontender, nondistended. No hepatosplenomegaly. No bruits or masses.  Extremities: no cyanosis, clubbing, rash, 1+ pitting edema bilateral lower legs although softer Neuro: alert & oriented x3, cranial nerves grossly intact. Moves all 4 extremities w/o difficulty. Affect pleasant.   ECG: 09/28/22 showed ST HR 105   ASSESSMENT & PLAN:  1: NICM with mildly reduced ejection fraction- - suspect due to HTN as cath showed nonobstructive CAD - NYHA class II - euvolemic today - weighing daily; reminded to call for an overnight weight gain of > 2 pounds or a weekly weight gain of > 5  pounds - weight down 4 pounds from last visit here 2 weeks ago; he continues to walk daily at the track - echo 05/15/21: EF of 40-45% along with mild LVH, moderate LAE, mild MR & moderate/severe TR.  - echo 10/20/22: EF 55-60% with severe LVH, normal PA pressure of 23.1 mmHg - LHC done 05/08/20 showed: Prox RCA lesion is 30% stenosed. Mid RCA lesion is 30% stenosed. Ost LAD lesion is 30% stenosed. Prox LAD lesion is 30% stenosed.  1. Mildly elevated filling pressures.  2. Mild pulmonary venous hypertension.  3. Nonobstructive CAD.   - not adding salt to his food and is reading food labels for sodium content - continue carvedilol 6.25mg  BID - continue jardiance 10mg  daily - continue entresto 49/51mg  BID; he's been finishing his 24/26mg  dose by taking 2 BID and will go pick up the 49/51mg  dose today; reminded that when he gets the new dose, he will go back to 1 tablet BID - continue spironolactone 25mg  daily - continue torsemide 20mg  daily - plan to titrate carvedilol at next visit once his meds are straightened out and he gets med management paperwork filled out - BMP today & then again at next visit - saw [facility] provider 03/22 - wearing compression socks daily with removal at bedtime; legs still have edema although are softer - BNP 05/21/21 was 380.5 - PharmD reconciled medications with the patient  2: HTN- - BP 141/91; recheck w/ manual cuff was 132/98 - has NP appt at [facility] 10/24 - BMP 09/28/22 reviewed and showed sodium 142, potassium 4.4, creatinine 1.29 and GFR 68 - BMP today  3: DM- - A1c 09/28/22 was 7.0.6% - currently diet controlled - continue atorvastatin 80mg  daily - lipid panel today  4: Paroxysmal atrial flutter- - saw cardiology Fransico Michael) 10/23; NS 03/24 as he didn't have any insurance - continue xarelto 20mg  daily  5: Obstructive sleep apnea- - currently not wearing his CPAP as it's not working properly - unclear as to when a sleep study was done or  who ordered his CPAP - has pulmonology appt 10/24 - wearing oxygen at 4L PRN during the day & at bedtime  Return in 3 weeks, sooner if needed.

## 2022-10-26 ENCOUNTER — Encounter: Payer: Self-pay | Admitting: Family

## 2022-10-26 ENCOUNTER — Other Ambulatory Visit: Payer: Self-pay

## 2022-10-26 ENCOUNTER — Ambulatory Visit: Payer: Self-pay | Attending: Family | Admitting: Family

## 2022-10-26 ENCOUNTER — Encounter: Payer: Self-pay | Admitting: Pharmacist

## 2022-10-26 VITALS — BP 132/98 | HR 90 | Ht 66.0 in | Wt 303.0 lb

## 2022-10-26 DIAGNOSIS — I2510 Atherosclerotic heart disease of native coronary artery without angina pectoris: Secondary | ICD-10-CM | POA: Insufficient documentation

## 2022-10-26 DIAGNOSIS — Z8249 Family history of ischemic heart disease and other diseases of the circulatory system: Secondary | ICD-10-CM | POA: Insufficient documentation

## 2022-10-26 DIAGNOSIS — I110 Hypertensive heart disease with heart failure: Secondary | ICD-10-CM | POA: Insufficient documentation

## 2022-10-26 DIAGNOSIS — I4892 Unspecified atrial flutter: Secondary | ICD-10-CM

## 2022-10-26 DIAGNOSIS — I5022 Chronic systolic (congestive) heart failure: Secondary | ICD-10-CM

## 2022-10-26 DIAGNOSIS — G4733 Obstructive sleep apnea (adult) (pediatric): Secondary | ICD-10-CM

## 2022-10-26 DIAGNOSIS — I272 Pulmonary hypertension, unspecified: Secondary | ICD-10-CM | POA: Insufficient documentation

## 2022-10-26 DIAGNOSIS — I509 Heart failure, unspecified: Secondary | ICD-10-CM | POA: Insufficient documentation

## 2022-10-26 DIAGNOSIS — E785 Hyperlipidemia, unspecified: Secondary | ICD-10-CM | POA: Insufficient documentation

## 2022-10-26 DIAGNOSIS — N1831 Chronic kidney disease, stage 3a: Secondary | ICD-10-CM

## 2022-10-26 DIAGNOSIS — Z79899 Other long term (current) drug therapy: Secondary | ICD-10-CM | POA: Insufficient documentation

## 2022-10-26 DIAGNOSIS — E669 Obesity, unspecified: Secondary | ICD-10-CM | POA: Insufficient documentation

## 2022-10-26 DIAGNOSIS — I428 Other cardiomyopathies: Secondary | ICD-10-CM | POA: Insufficient documentation

## 2022-10-26 DIAGNOSIS — E119 Type 2 diabetes mellitus without complications: Secondary | ICD-10-CM | POA: Insufficient documentation

## 2022-10-26 DIAGNOSIS — I10 Essential (primary) hypertension: Secondary | ICD-10-CM

## 2022-10-26 DIAGNOSIS — E1122 Type 2 diabetes mellitus with diabetic chronic kidney disease: Secondary | ICD-10-CM

## 2022-10-26 DIAGNOSIS — Z7901 Long term (current) use of anticoagulants: Secondary | ICD-10-CM | POA: Insufficient documentation

## 2022-10-26 NOTE — Progress Notes (Signed)
Patient ID: [name], male   DOB: [birth year], 47 y.o.   MRN: [id number] [facility] FAILURE [facility] - PHARMACIST COUNSELING NOTE  Guideline-Directed Medical Therapy/Evidence Based Medicine  ACE/ARB/ARNI: Sacubitril-valsartan 24-26 mg twice daily Beta Blocker: Carvedilol 6.25 mg twice daily Aldosterone Antagonist: Spironolactone 25 mg daily Diuretic: Torsemide 20 mg daily SGLT2i: Empagliflozin 10mg  daily  Adherence Assessment  Do you ever forget to take your medication? [] Yes [x] No  Do you ever skip doses due to side effects? [] Yes [x] No  Do you have trouble affording your medicines? [] Yes [x] No  Are you ever unable to pick up your medication due to transportation difficulties? [] Yes [x] No  Do you ever stop taking your medications because you don't believe they are helping? [] Yes [x] No  Do you check your weight daily? [] Yes [x] No   Adherence strategy: none  Barriers to obtaining medications: none  Vital signs: HR 80, BP 152/89, weight (pounds) 307 lbs ECHO: Date 05/15/21, EF 40-45%, notes mild LVH, moderate LAE, mild MR & moderate/severe TR.      Latest Ref Rng & Units 10/12/2022   10:11 AM 09/28/2022   11:34 AM 12/21/2021   11:08 AM  BMP  Glucose 70 - 99 mg/dL 85  98  166   BUN 6 - 24 mg/dL 16  18  24    Creatinine 0.76 - 1.27 mg/dL 0.63  0.16  0.10   BUN/Creat Ratio 9 - 20 10  14     Sodium 134 - 144 mmol/L 143  142  138   Potassium 3.5 - 5.2 mmol/L 4.9  4.4  3.6   Chloride 96 - 106 mmol/L 98  101  104   CO2 20 - 29 mmol/L 26  26  27    Calcium 8.7 - 10.2 mg/dL 9.4  9.5  9.4     Past Medical History:  Diagnosis Date   Atrial flutter (HCC)    Chronic combined systolic and diastolic CHF (congestive heart failure) (HCC)    Diabetes mellitus without complication (HCC)    Hyperlipidemia    Hypertension    Obesity    Sleep apnea, obstructive     ASSESSMENT 47 year old male who presents to the HF clinic for follow up. PMH includes DM,  hyperlipidemia, HTN, atrial flutter, obesity, previous tobacco use and chronic heart failure. Last admission to acute care was > 6 months ago.   MedRec completed and patient denies problem with current therapy. Potential SE of Jardiance including low BG, dehydration, and fungal infection discussed. Strategies like keeping groin  area clean and dry, cleaning after voiding, proper hydration and notify MD if problems also discussed with patient.   PLAN  Increase Entresto to 49/51 mg twice daily. Weight daily and bring BP log to next OV Follow up as directed by NP  Time spent: 15 minutes  Jocelin Schuelke Rodriguez-Guzman PharmD, BCPS 10/26/2022 11:06 AM   Current Outpatient Medications:    atorvastatin (LIPITOR) 40 MG tablet, Take 2 tablets (80 mg total) by mouth at bedtime., Disp: 180 tablet, Rfl: 0   carvedilol (COREG) 6.25 MG tablet, Take 1 tablet (6.25 mg total) by mouth 2 (two) times daily with a meal., Disp: 180 tablet, Rfl: 3   empagliflozin (JARDIANCE) 10 MG TABS tablet, Take 1 tablet (10 mg total) by mouth daily before breakfast., Disp: 30 tablet, Rfl: 3   OXYGEN, Inhale 4 L into the lungs at bedtime., Disp: , Rfl:    rivaroxaban (XARELTO) 20 MG TABS tablet, Take 1 tablet (20  mg total) by mouth daily with supper., Disp: 90 tablet, Rfl: 0   sacubitril-valsartan (ENTRESTO) 49-51 MG, Take 1 tablet by mouth 2 (two) times daily., Disp: 60 tablet, Rfl: 5   spironolactone (ALDACTONE) 25 MG tablet, Take 1 tablet (25 mg total) by mouth once daily., Disp: 90 tablet, Rfl: 3   torsemide (DEMADEX) 20 MG tablet, Take 1 tablet (20 mg total) by mouth once daily., Disp: 90 tablet, Rfl: 3   MEDICATION ADHERENCES TIPS AND STRATEGIES Taking medication as prescribed improves patient outcomes in heart failure (reduces hospitalizations, improves symptoms, increases survival) Side effects of medications can be managed by decreasing doses, switching agents, stopping drugs, or adding additional therapy. Please let  someone in the Heart Failure Clinic know if you have having bothersome side effects so we can modify your regimen. Do not alter your medication regimen without talking to [place].  Medication reminders can help patients remember to take drugs on time. If you are missing or forgetting doses you can try linking behaviors, using pill boxes, or an electronic reminder like an alarm on your phone or an app. Some people can also get automated phone calls as medication reminders.

## 2022-10-26 NOTE — Addendum Note (Signed)
Addended by: Electa Sniff on: 10/26/2022 10:54 AM   Modules accepted: Orders

## 2022-10-26 NOTE — Patient Instructions (Addendum)
Go over to the [facility]. Go pass the gift shop and have your blood work completed.  After you get your lab work please go to the [facility] across the street to pick up your medications.   Follow up with Clarisa Kindred, FNP in 3 weeks

## 2022-10-27 LAB — BASIC METABOLIC PANEL
BUN/Creatinine Ratio: 13 (ref 9–20)
BUN: 21 mg/dL (ref 6–24)
CO2: 27 mmol/L (ref 20–29)
Calcium: 9.6 mg/dL (ref 8.7–10.2)
Chloride: 98 mmol/L (ref 96–106)
Creatinine, Ser: 1.56 mg/dL — ABNORMAL HIGH (ref 0.76–1.27)
Glucose: 81 mg/dL (ref 70–99)
Potassium: 4.8 mmol/L (ref 3.5–5.2)
Sodium: 146 mmol/L — ABNORMAL HIGH (ref 134–144)
eGFR: 54 mL/min/{1.73_m2} — ABNORMAL LOW (ref >59)

## 2022-10-27 LAB — LIPID PANEL
Chol/HDL Ratio: 3.5 ratio (ref 0.0–5.0)
Cholesterol, Total: 131 mg/dL (ref 100–199)
HDL: 37 mg/dL — ABNORMAL LOW (ref >39)
LDL Chol Calc (NIH): 64 mg/dL (ref 0–99)
Triglycerides: 176 mg/dL — ABNORMAL HIGH (ref 0–149)
VLDL Cholesterol Cal: 30 mg/dL (ref 5–40)

## 2022-11-02 ENCOUNTER — Institutional Professional Consult (permissible substitution): Payer: Self-pay | Admitting: Pulmonary Disease

## 2022-11-07 ENCOUNTER — Other Ambulatory Visit: Payer: Self-pay

## 2022-11-09 ENCOUNTER — Other Ambulatory Visit: Payer: Self-pay

## 2022-11-14 ENCOUNTER — Other Ambulatory Visit: Payer: Self-pay

## 2022-11-15 NOTE — Progress Notes (Deleted)
PCP: none Primary Cardiologist: Yvonne Kendall, MD (last saw 10/23)  HPI:  [name] is a 47 y/o male with a history of DM, hyperlipidemia, sleep apnea (on CPAP), HTN, paroxysmal atrial flutter, DM2, obesity, polycythemia, possible obesity hypoventilation syndrome, previous tobacco use and chronic heart failure.   Admitted to the hospital in 03/2020 with volume overload and uncontrolled hypertension in the 180s over 100s. Echo during that admission demonstrated an EF of 40 to 45%, global hypokinesis, severe concentric LVH, grade 2 diastolic dysfunction, normal PASP, severe biatrial enlargement, trivial mitral regurgitation, and an estimated right atrial pressure of 8 mmHg.  Lexiscan MPI was undertaken during that admission which showed no evidence of ischemia with extensive wall motion abnormalities including apparent dyskinesia involving the basilar aspect of the inferior wall of the LV with an EF of 31%.  He was diuresed and started on GDMT with symptomatic improvement. He was also noted to have a brief episode of paroxysmal atrial flutter and was initiated on Xarelto. He converted to sinus rhythm during the admission   He was lost to follow-up until readmission in 04/2021 with acute on chronic combined CHF.  High-sensitivity troponin peaking at 67.  BNP 990.  He was diuresed 26 L with subsequent improvement in symptoms and AKI leading to the holding of diuresis and GDMT.  With diuresis, BNP improved to 380.  Echo demonstrated a stable cardiomyopathy with an EF of 40 to 45%, global hypokinesis, mild LVH, grade 2 diastolic dysfunction, moderately reduced RV systolic function with mildly enlarged RV cavity size, moderate biatrial enlargement, mild mitral regurgitation, moderate to severe tricuspid regurgitation, and an estimated right atrial pressure of 3 mmHg. Hospital course was subsequently complicated by acute respiratory distress requiring mechanical ventilation with concern for HAP with improvement  following antibiotic therapy.  HTN runs in his family, father and brother had severe hypertension. Brother had a pulmonary embolism at age 45. Thinks his brother also had "thick blood" polycythemia.    Echo 05/15/21: EF of 40-45% along with mild LVH, moderate LAE, mild MR & moderate/severe TR. Echo 09/23/21: EF 55-60% with moderate LVH  Echo 10/20/22: EF 55-60% with severe LVH, normal PA pressure of 23.1 mmHg  LHC done 05/08/20 showed: Prox RCA lesion is 30% stenosed. Mid RCA lesion is 30% stenosed. Ost LAD lesion is 30% stenosed. Prox LAD lesion is 30% stenosed.  1. Mildly elevated filling pressures.  2. Mild pulmonary venous hypertension.  3. Nonobstructive CAD.   Nonischemic cardiomyopathy.   He presents today for a HF f/u visit with a chief complaint of     ROS: All systems negative except as listed in HPI, PMH and Problem List.  SH:  Social History   Socioeconomic History   Marital status: Single    Spouse name: Not on file   Number of children: Not on file   Years of education: Not on file   Highest education level: Not on file  Occupational History   Not on file  Tobacco Use   Smoking status: Former    Current packs/day: 0.00    Average packs/day: 1 pack/day for 27.0 years (27.0 ttl pk-yrs)    Types: Cigarettes    Start date: 04/06/1993    Quit date: 04/06/2020    Years since quitting: 2.6   Smokeless tobacco: Never  Vaping Use   Vaping status: Never Used  Substance and Sexual Activity   Alcohol use: Not Currently   Drug use: Yes    Frequency: 7.0 times per week  Types: Marijuana    Comment: daily   Sexual activity: Yes    Partners: Female    Birth control/protection: None  Other Topics Concern   Not on file  Social History Narrative   Not on file   Social Determinants of Health   Financial Resource Strain: Low Risk  (04/09/2020)   Overall Financial Resource Strain (CARDIA)    Difficulty of Paying Living Expenses: Not very hard  Food Insecurity: No  Food Insecurity (04/09/2020)   Hunger Vital Sign    Worried About Running Out of Food in the Last Year: Never true    Ran Out of Food in the Last Year: Never true  Transportation Needs: No Transportation Needs (04/09/2020)   PRAPARE - Administrator, Civil Service (Medical): No    Lack of Transportation (Non-Medical): No  Physical Activity: Inactive (04/09/2020)   Exercise Vital Sign    Days of Exercise per Week: 0 days    Minutes of Exercise per Session: 0 min  Stress: Not on file  Social Connections: Not on file  Intimate Partner Violence: Not on file    FH:  Family History  Problem Relation Age of Onset   Hypertension Father    Hypertension Brother    Pulmonary embolism Brother     Past Medical History:  Diagnosis Date   Atrial flutter (HCC)    Chronic combined systolic and diastolic CHF (congestive heart failure) (HCC)    Diabetes mellitus without complication (HCC)    Hyperlipidemia    Hypertension    Obesity    Sleep apnea, obstructive     Current Outpatient Medications  Medication Sig Dispense Refill   atorvastatin (LIPITOR) 40 MG tablet Take 2 tablets (80 mg total) by mouth at bedtime. 180 tablet 0   carvedilol (COREG) 6.25 MG tablet Take 1 tablet (6.25 mg total) by mouth 2 (two) times daily with a meal. 180 tablet 3   empagliflozin (JARDIANCE) 10 MG TABS tablet Take 1 tablet (10 mg total) by mouth daily before breakfast. 30 tablet 3   OXYGEN Inhale 4 L into the lungs at bedtime.     rivaroxaban (XARELTO) 20 MG TABS tablet Take 1 tablet (20 mg total) by mouth daily with supper. 90 tablet 0   sacubitril-valsartan (ENTRESTO) 49-51 MG Take 1 tablet by mouth 2 (two) times daily. 60 tablet 5   spironolactone (ALDACTONE) 25 MG tablet Take 1 tablet (25 mg total) by mouth once daily. 90 tablet 3   torsemide (DEMADEX) 20 MG tablet Take 1 tablet (20 mg total) by mouth once daily. 90 tablet 3   No current facility-administered medications for this visit.       PHYSICAL EXAM:  General:  Well appearing. No resp difficulty HEENT: normal Neck: supple. JVP flat. No lymphadenopathy or thryomegaly appreciated. Cor: PMI normal. Regular rhythm, rate. No rubs, gallops or murmurs. Lungs: clear Abdomen: soft, nontender, nondistended. No hepatosplenomegaly. No bruits or masses.  Extremities: no cyanosis, clubbing, rash, 1+ pitting edema bilateral lower legs although softer Neuro: alert & oriented x3, cranial nerves grossly intact. Moves all 4 extremities w/o difficulty. Affect pleasant.   ECG: 09/28/22 showed ST HR 105   ASSESSMENT & PLAN:  1: NICM with mildly reduced ejection fraction- - suspect due to HTN as cath showed nonobstructive CAD - NYHA class II - euvolemic today - weighing daily; reminded to call for an overnight weight gain of > 2 pounds or a weekly weight gain of > 5 pounds - weight 303  pounds from last visit here 3 weeks ago; he continues to walk daily at the track - echo 05/15/21: EF of 40-45% along with mild LVH, moderate LAE, mild MR & moderate/severe TR.  - echo 09/23/21: EF 55-60% with moderate LVH  - echo 10/20/22: EF 55-60% with severe LVH, normal PA pressure of 23.1 mmHg - LHC done 05/08/20 showed: Prox RCA lesion is 30% stenosed. Mid RCA lesion is 30% stenosed. Ost LAD lesion is 30% stenosed. Prox LAD lesion is 30% stenosed.  1. Mildly elevated filling pressures.  2. Mild pulmonary venous hypertension.  3. Nonobstructive CAD.   - not adding salt to his food and is reading food labels for sodium content - continue carvedilol 6.25mg  BID - continue jardiance 10mg  daily - continue entresto 49/51mg  BID - continue spironolactone 25mg  daily - continue torsemide 20mg  daily - plan to titrate carvedilol at next visit once his meds are straightened out and he gets med management paperwork filled out - BMP today  - saw [facility] provider 03/22 - wearing compression socks daily with removal at bedtime; legs still have edema although  are softer - BNP 05/21/21 was 380.5 - PharmD reconciled medications with the patient  2: HTN- - BP 1 - has NP appt at [facility] 10/24 - BMP 10/26/22 reviewed and showed sodium 146, potassium 4.8, creatinine 1.56 and GFR 54  3: DM- - A1c 09/28/22 was 7.0.6% - currently diet controlled - continue atorvastatin 80mg  daily - LDL 10/26/22 was 64  4: Paroxysmal atrial flutter- - saw cardiology Fransico Michael) 10/23; NS 03/24 as he didn't have any insurance - continue xarelto 20mg  daily  5: Obstructive sleep apnea- - currently not wearing his CPAP as it's not working properly - unclear as to when a sleep study was done or who ordered his CPAP - has pulmonology appt 10/24 - wearing oxygen at 4L PRN during the day & at bedtime

## 2022-11-16 ENCOUNTER — Encounter: Payer: Self-pay | Admitting: Family

## 2022-11-23 ENCOUNTER — Other Ambulatory Visit: Payer: Self-pay

## 2022-11-23 ENCOUNTER — Institutional Professional Consult (permissible substitution): Payer: Self-pay | Admitting: Pulmonary Disease

## 2022-11-23 MED FILL — Torsemide Tab 20 MG: ORAL | 30 days supply | Qty: 30 | Fill #1 | Status: AC

## 2022-11-23 MED FILL — Spironolactone Tab 25 MG: ORAL | 30 days supply | Qty: 30 | Fill #1 | Status: AC

## 2022-11-23 MED FILL — Carvedilol Tab 6.25 MG: ORAL | 30 days supply | Qty: 60 | Fill #1 | Status: AC

## 2022-11-23 MED FILL — Atorvastatin Calcium Tab 40 MG (Base Equivalent): ORAL | 30 days supply | Qty: 60 | Fill #1 | Status: CN

## 2022-11-23 MED FILL — Atorvastatin Calcium Tab 40 MG (Base Equivalent): ORAL | 30 days supply | Qty: 60 | Fill #1 | Status: AC

## 2022-11-29 ENCOUNTER — Ambulatory Visit (INDEPENDENT_AMBULATORY_CARE_PROVIDER_SITE_OTHER): Payer: Self-pay | Admitting: Physician Assistant

## 2022-11-29 DIAGNOSIS — Z91199 Patient's noncompliance with other medical treatment and regimen due to unspecified reason: Secondary | ICD-10-CM

## 2022-11-29 NOTE — Progress Notes (Unsigned)
Patient was not seen for appt d/t no call, no show, or late arrival >10 mins past appt time.    Debera Lat PA [facility] [address] [place], [place] [address] [phone] (phone) [phone] (fax) [facility]

## 2022-12-01 NOTE — Progress Notes (Signed)
Patient ID: [name], male   DOB: [birth year], 47 y.o.   MRN: [id number] [facility] - HEART FAILURE CLINIC - PHARMACIST COUNSELING NOTE  Guideline-Directed Medical Therapy/Evidence Based Medicine  ACE/ARB/ARNI: Sacubitril-valsartan 24-26 mg twice daily Beta Blocker: Carvedilol 6.25 mg twice daily Aldosterone Antagonist: Spironolactone 25 mg daily Diuretic: Torsemide 20 mg daily SGLT2i: Empagliflozin 10mg  daily  Adherence Assessment  Do you ever forget to take your medication? [] Yes [x] No  Do you ever skip doses due to side effects? [] Yes [x] No  Do you have trouble affording your medicines? [] Yes [x] No  Are you ever unable to pick up your medication due to transportation difficulties? [] Yes [x] No  Do you ever stop taking your medications because you don't believe they are helping? [] Yes [x] No  Do you check your weight daily? [] Yes [x] No   Adherence strategy: none  Barriers to obtaining medications: no insurance at this time. Paperwork for medication management completed. They will follow with patient assistance as needed.  Vital signs: HR 90, BP 132/98, weight (pounds) 303 lbs  ECHO: Date 05/15/21, EF 40-45%, notes mild LVH, moderate LAE, mild MR & moderate/severe TR.      Latest Ref Rng & Units 10/26/2022   10:58 AM 10/12/2022   10:11 AM 09/28/2022   11:34 AM  BMP  Glucose 70 - 99 mg/dL 81  85  98   BUN 6 - 24 mg/dL 21  16  18    Creatinine 0.76 - 1.27 mg/dL 4.09  8.11  9.14   BUN/Creat Ratio 9 - 20 13  10  14    Sodium 134 - 144 mmol/L 146  143  142   Potassium 3.5 - 5.2 mmol/L 4.8  4.9  4.4   Chloride 96 - 106 mmol/L 98  98  101   CO2 20 - 29 mmol/L 27  26  26    Calcium 8.7 - 10.2 mg/dL 9.6  9.4  9.5     Past Medical History:  Diagnosis Date   Atrial flutter (HCC)    Chronic combined systolic and diastolic CHF (congestive heart failure) (HCC)    Diabetes mellitus without complication (HCC)    Hyperlipidemia    Hypertension    Obesity     Sleep apnea, obstructive     ASSESSMENT 47 year old male who presents to the HF clinic for follow up. PMH includes DM, hyperlipidemia, HTN, atrial flutter, obesity, previous tobacco use and chronic heart failure. Last admission to acute care was > 6 months ago.   MedRec completed and patient denies problem with current therapy.  Patient reports inability to pay for medication (uninsured). Referral to medication management completed. Samples for new Entresto dose provided. Medication Management with follow up patient assistance applications as needed.   PLAN  Increase Entresto to 49/51 mg twice daily. Weight daily and bring BP log to next OV Plan to titrate carvedilol during next OV Follow up as directed by NP  Time spent: 15 minutes  Eastyn Dattilo Rodriguez-Guzman PharmD, BCPS 12/01/2022 2:40 PM   Current Outpatient Medications:    atorvastatin (LIPITOR) 40 MG tablet, Take 2 tablets (80 mg total) by mouth at bedtime., Disp: 180 tablet, Rfl: 0   carvedilol (COREG) 6.25 MG tablet, Take 1 tablet (6.25 mg total) by mouth 2 (two) times daily with a meal., Disp: 180 tablet, Rfl: 3   empagliflozin (JARDIANCE) 10 MG TABS tablet, Take 1 tablet (10 mg total) by mouth daily before breakfast., Disp: 30 tablet, Rfl: 3   OXYGEN, Inhale 4 L  into the lungs at bedtime., Disp: , Rfl:    rivaroxaban (XARELTO) 20 MG TABS tablet, Take 1 tablet (20 mg total) by mouth daily with supper., Disp: 90 tablet, Rfl: 0   sacubitril-valsartan (ENTRESTO) 49-51 MG, Take 1 tablet by mouth 2 (two) times daily., Disp: 60 tablet, Rfl: 5   spironolactone (ALDACTONE) 25 MG tablet, Take 1 tablet (25 mg total) by mouth once daily., Disp: 90 tablet, Rfl: 3   torsemide (DEMADEX) 20 MG tablet, Take 1 tablet (20 mg total) by mouth once daily., Disp: 90 tablet, Rfl: 3   MEDICATION ADHERENCES TIPS AND STRATEGIES Taking medication as prescribed improves patient outcomes in heart failure (reduces hospitalizations, improves symptoms,  increases survival) Side effects of medications can be managed by decreasing doses, switching agents, stopping drugs, or adding additional therapy. Please let someone in the Heart Failure Clinic know if you have having bothersome side effects so we can modify your regimen. Do not alter your medication regimen without talking to [place].  Medication reminders can help patients remember to take drugs on time. If you are missing or forgetting doses you can try linking behaviors, using pill boxes, or an electronic reminder like an alarm on your phone or an app. Some people can also get automated phone calls as medication reminders.

## 2022-12-02 ENCOUNTER — Telehealth: Payer: Self-pay | Admitting: Family

## 2022-12-02 ENCOUNTER — Encounter: Payer: Self-pay | Admitting: Family

## 2022-12-02 NOTE — Telephone Encounter (Signed)
Patient did not show for his Heart Failure Clinic appointment on 12/02/22.

## 2022-12-19 ENCOUNTER — Encounter (HOSPITAL_BASED_OUTPATIENT_CLINIC_OR_DEPARTMENT_OTHER): Payer: Self-pay

## 2022-12-19 ENCOUNTER — Other Ambulatory Visit: Payer: Self-pay

## 2022-12-27 ENCOUNTER — Other Ambulatory Visit: Payer: Self-pay

## 2022-12-28 ENCOUNTER — Other Ambulatory Visit: Payer: Self-pay

## 2022-12-30 ENCOUNTER — Other Ambulatory Visit: Payer: Self-pay

## 2022-12-30 MED FILL — Rivaroxaban Tab 20 MG: ORAL | 30 days supply | Qty: 30 | Fill #1 | Status: CN

## 2022-12-30 MED FILL — Spironolactone Tab 25 MG: ORAL | 30 days supply | Qty: 30 | Fill #2 | Status: CN

## 2022-12-30 MED FILL — Carvedilol Tab 6.25 MG: ORAL | 30 days supply | Qty: 60 | Fill #2 | Status: CN

## 2022-12-30 MED FILL — Torsemide Tab 20 MG: ORAL | 30 days supply | Qty: 30 | Fill #2 | Status: CN

## 2022-12-30 MED FILL — Atorvastatin Calcium Tab 40 MG (Base Equivalent): ORAL | 30 days supply | Qty: 60 | Fill #2 | Status: CN

## 2023-01-02 ENCOUNTER — Other Ambulatory Visit: Payer: Self-pay

## 2023-01-16 ENCOUNTER — Other Ambulatory Visit: Payer: Self-pay

## 2023-01-17 ENCOUNTER — Other Ambulatory Visit: Payer: Self-pay

## 2023-01-20 ENCOUNTER — Other Ambulatory Visit: Payer: Self-pay

## 2023-01-20 MED FILL — Atorvastatin Calcium Tab 40 MG (Base Equivalent): ORAL | 30 days supply | Qty: 60 | Fill #2 | Status: CN

## 2023-01-25 ENCOUNTER — Other Ambulatory Visit: Payer: Self-pay

## 2023-01-25 MED FILL — Carvedilol Tab 6.25 MG: ORAL | 30 days supply | Qty: 60 | Fill #2 | Status: AC

## 2023-01-25 MED FILL — Rivaroxaban Tab 20 MG: ORAL | 30 days supply | Qty: 30 | Fill #1 | Status: AC

## 2023-01-25 MED FILL — Atorvastatin Calcium Tab 40 MG (Base Equivalent): ORAL | 30 days supply | Qty: 60 | Fill #2 | Status: AC

## 2023-01-25 MED FILL — Torsemide Tab 20 MG: ORAL | 30 days supply | Qty: 30 | Fill #2 | Status: AC

## 2023-01-25 MED FILL — Spironolactone Tab 25 MG: ORAL | 30 days supply | Qty: 30 | Fill #2 | Status: AC

## 2023-02-07 ENCOUNTER — Institutional Professional Consult (permissible substitution): Payer: Self-pay | Admitting: Pulmonary Disease

## 2023-02-20 ENCOUNTER — Other Ambulatory Visit: Payer: Self-pay

## 2023-03-06 ENCOUNTER — Other Ambulatory Visit: Payer: Self-pay

## 2023-03-06 MED FILL — Rivaroxaban Tab 20 MG: ORAL | 30 days supply | Qty: 30 | Fill #2 | Status: CN

## 2023-03-06 NOTE — Progress Notes (Unsigned)
PCP: Debera Lat, PA (last seen 10/24) Primary Cardiologist: End, Cristal Deer, MD (last saw 10/23)  HPI:  Gregory Mckenzie is a 48 y/o male with a history of DM, hyperlipidemia, sleep apnea (on CPAP), HTN, paroxysmal atrial flutter, DM2, obesity, polycythemia, possible obesity hypoventilation syndrome, previous tobacco use and chronic heart failure.   Admitted to the hospital in 03/2020 with volume overload and uncontrolled hypertension in the 180s over 100s. Echo during that admission demonstrated an EF of 40 to 45%, global hypokinesis, severe concentric LVH, grade 2 diastolic dysfunction, normal PASP, severe biatrial enlargement, trivial mitral regurgitation, and an estimated right atrial pressure of 8 mmHg.  Lexiscan MPI was undertaken during that admission which showed no evidence of ischemia with extensive wall motion abnormalities including apparent dyskinesia involving the basilar aspect of the inferior wall of the LV with an EF of 31%.  He was diuresed and started on GDMT with symptomatic improvement. He was also noted to have a brief episode of paroxysmal atrial flutter and was initiated on Xarelto. He converted to sinus rhythm during the admission   He was lost to follow-up until readmission in 04/2021 with acute on chronic combined CHF.  High-sensitivity troponin peaking at 67.  BNP 990.  He was diuresed 26 L with subsequent improvement in symptoms and AKI leading to the holding of diuresis and GDMT.  With diuresis, BNP improved to 380.  Echo demonstrated a stable cardiomyopathy with an EF of 40 to 45%, global hypokinesis, mild LVH, grade 2 diastolic dysfunction, moderately reduced RV systolic function with mildly enlarged RV cavity size, moderate biatrial enlargement, mild mitral regurgitation, moderate to severe tricuspid regurgitation, and an estimated right atrial pressure of 3 mmHg. Hospital course was subsequently complicated by acute respiratory distress requiring mechanical ventilation with  concern for HAP with improvement following antibiotic therapy.  HTN runs in his family, father and brother had severe hypertension. Brother had a pulmonary embolism at age 23. Thinks his brother also had "thick blood" polycythemia.    Echo 05/15/21: EF of 40-45% along with mild LVH, moderate LAE, mild Gregory & moderate/severe TR.  Echo 10/20/22: EF 55-60% with severe LVH, normal PA pressure of 23.1 mmHg  LHC done 05/08/20 showed: Prox RCA lesion is 30% stenosed. Mid RCA lesion is 30% stenosed. Ost LAD lesion is 30% stenosed. Prox LAD lesion is 30% stenosed.  1. Mildly elevated filling pressures.  2. Mild pulmonary venous hypertension.  3. Nonobstructive CAD.   Nonischemic cardiomyopathy.   He presents today for a HF f/u visit with a chief complaint of    ROS: All systems negative except as listed in HPI, PMH and Problem List.  SH:  Social History   Socioeconomic History   Marital status: Single    Spouse name: Not on file   Number of children: Not on file   Years of education: Not on file   Highest education level: Not on file  Occupational History   Not on file  Tobacco Use   Smoking status: Former    Current packs/day: 0.00    Average packs/day: 1 pack/day for 27.0 years (27.0 ttl pk-yrs)    Types: Cigarettes    Start date: 04/06/1993    Quit date: 04/06/2020    Years since quitting: 2.9   Smokeless tobacco: Never  Vaping Use   Vaping status: Never Used  Substance and Sexual Activity   Alcohol use: Not Currently   Drug use: Yes    Frequency: 7.0 times per week    Types:  Marijuana    Comment: daily   Sexual activity: Yes    Partners: Female    Birth control/protection: None  Other Topics Concern   Not on file  Social History Narrative   Not on file   Social Drivers of Health   Financial Resource Strain: Low Risk  (04/09/2020)   Overall Financial Resource Strain (CARDIA)    Difficulty of Paying Living Expenses: Not very hard  Food Insecurity: No Food Insecurity  (04/09/2020)   Hunger Vital Sign    Worried About Running Out of Food in the Last Year: Never true    Ran Out of Food in the Last Year: Never true  Transportation Needs: No Transportation Needs (04/09/2020)   PRAPARE - Administrator, Civil Service (Medical): No    Lack of Transportation (Non-Medical): No  Physical Activity: Inactive (04/09/2020)   Exercise Vital Sign    Days of Exercise per Week: 0 days    Minutes of Exercise per Session: 0 min  Stress: Not on file  Social Connections: Not on file  Intimate Partner Violence: Not on file    FH:  Family History  Problem Relation Age of Onset   Hypertension Father    Hypertension Brother    Pulmonary embolism Brother     Past Medical History:  Diagnosis Date   Atrial flutter (HCC)    Chronic combined systolic and diastolic CHF (congestive heart failure) (HCC)    Diabetes mellitus without complication (HCC)    Hyperlipidemia    Hypertension    Obesity    Sleep apnea, obstructive     Current Outpatient Medications  Medication Sig Dispense Refill   atorvastatin (LIPITOR) 40 MG tablet Take 2 tablets (80 mg total) by mouth at bedtime. 180 tablet 0   carvedilol (COREG) 6.25 MG tablet Take 1 tablet (6.25 mg total) by mouth 2 (two) times daily with a meal. 180 tablet 3   empagliflozin (JARDIANCE) 10 MG TABS tablet Take 1 tablet (10 mg total) by mouth daily before breakfast. 30 tablet 3   OXYGEN Inhale 4 L into the lungs at bedtime.     rivaroxaban (XARELTO) 20 MG TABS tablet Take 1 tablet (20 mg total) by mouth daily with supper. 90 tablet 0   sacubitril-valsartan (ENTRESTO) 49-51 MG Take 1 tablet by mouth 2 (two) times daily. 60 tablet 5   spironolactone (ALDACTONE) 25 MG tablet Take 1 tablet (25 mg total) by mouth once daily. 90 tablet 3   torsemide (DEMADEX) 20 MG tablet Take 1 tablet (20 mg total) by mouth once daily. 90 tablet 3   No current facility-administered medications for this visit.      PHYSICAL  EXAM:  General:  Well appearing. No resp difficulty HEENT: normal Neck: supple. JVP flat. No lymphadenopathy or thryomegaly appreciated. Cor: PMI normal. Regular rhythm, rate. No rubs, gallops or murmurs. Lungs: clear Abdomen: soft, nontender, nondistended. No hepatosplenomegaly. No bruits or masses.  Extremities: no cyanosis, clubbing, rash, 1+ pitting edema bilateral lower legs although softer Neuro: alert & oriented x3, cranial nerves grossly intact. Moves all 4 extremities w/o difficulty. Affect pleasant.   ECG: not done   ASSESSMENT & PLAN:  1: NICM with mildly reduced ejection fraction- - suspect due to HTN as cath showed nonobstructive CAD - NYHA class II - euvolemic today - weighing daily; reminded to call for an overnight weight gain of > 2 pounds or a weekly weight gain of > 5 pounds - weight 303 pounds from last visit  here 4 months ago - echo 05/15/21: EF of 40-45% along with mild LVH, moderate LAE, mild Gregory & moderate/severe TR.  - echo 10/20/22: EF 55-60% with severe LVH, normal PA pressure of 23.1 mmHg - LHC done 05/08/20 showed: Prox RCA lesion is 30% stenosed. Mid RCA lesion is 30% stenosed. Ost LAD lesion is 30% stenosed. Prox LAD lesion is 30% stenosed.  1. Mildly elevated filling pressures.  2. Mild pulmonary venous hypertension.  3. Nonobstructive CAD.   - not adding salt to his food and is reading food labels for sodium content - continue carvedilol 6.25mg  BID - continue jardiance 10mg  daily - continue entresto 49/51mg  BID - continue spironolactone 25mg  daily - continue torsemide 20mg  daily - saw ADHF provider 03/22 - wearing compression socks daily with removal at bedtime; legs still have edema although are softer - BNP 05/21/21 was 380.5  2: HTN- - BP  - saw PCP (Ostwalt) 10/24 - BMP 10/26/22 reviewed and showed sodium 146, potassium 4.8, creatinine 1.56 and GFR 54  3: DM- - A1c 09/28/22 was 7.0.6% - currently diet controlled - continue atorvastatin  80mg  daily - LDL 10/26/22 was 64  4: Paroxysmal atrial flutter- - saw cardiology Fransico Michael) 10/23; NS 03/24 as he didn't have any insurance - continue xarelto 20mg  daily  5: Obstructive sleep apnea- - currently not wearing his CPAP as it's not working properly - unclear as to when a sleep study was done or who ordered his CPAP - has pulmonology appt 10/24 - wearing oxygen at 4L PRN during the day & at bedtime

## 2023-03-07 ENCOUNTER — Other Ambulatory Visit
Admission: RE | Admit: 2023-03-07 | Discharge: 2023-03-07 | Disposition: A | Discharge status: Home / Self Care | Payer: Medicaid Other | Source: Ambulatory Visit | Attending: Family | Admitting: Family

## 2023-03-07 ENCOUNTER — Telehealth (HOSPITAL_COMMUNITY): Payer: Self-pay

## 2023-03-07 ENCOUNTER — Other Ambulatory Visit (HOSPITAL_COMMUNITY): Payer: Self-pay

## 2023-03-07 ENCOUNTER — Encounter: Payer: Self-pay | Admitting: Family

## 2023-03-07 ENCOUNTER — Other Ambulatory Visit: Payer: Self-pay

## 2023-03-07 ENCOUNTER — Ambulatory Visit: Payer: Medicaid Other | Admitting: Family

## 2023-03-07 VITALS — BP 150/95 | HR 99 | Wt 337.0 lb

## 2023-03-07 DIAGNOSIS — I5032 Chronic diastolic (congestive) heart failure: Secondary | ICD-10-CM | POA: Insufficient documentation

## 2023-03-07 DIAGNOSIS — E1122 Type 2 diabetes mellitus with diabetic chronic kidney disease: Secondary | ICD-10-CM | POA: Diagnosis not present

## 2023-03-07 DIAGNOSIS — N1831 Chronic kidney disease, stage 3a: Secondary | ICD-10-CM

## 2023-03-07 DIAGNOSIS — I10 Essential (primary) hypertension: Secondary | ICD-10-CM | POA: Diagnosis not present

## 2023-03-07 DIAGNOSIS — I4892 Unspecified atrial flutter: Secondary | ICD-10-CM

## 2023-03-07 DIAGNOSIS — G4733 Obstructive sleep apnea (adult) (pediatric): Secondary | ICD-10-CM

## 2023-03-07 DIAGNOSIS — I5022 Chronic systolic (congestive) heart failure: Secondary | ICD-10-CM | POA: Diagnosis present

## 2023-03-07 LAB — BASIC METABOLIC PANEL
Anion gap: 12 (ref 5–15)
BUN: 26 mg/dL — ABNORMAL HIGH (ref 6–20)
CO2: 28 mmol/L (ref 22–32)
Calcium: 9.1 mg/dL (ref 8.9–10.3)
Chloride: 99 mmol/L (ref 98–111)
Creatinine, Ser: 1.45 mg/dL — ABNORMAL HIGH (ref 0.61–1.24)
GFR, Estimated: 59 mL/min — ABNORMAL LOW (ref >60)
Glucose, Bld: 113 mg/dL — ABNORMAL HIGH (ref 70–99)
Potassium: 3.8 mmol/L (ref 3.5–5.1)
Sodium: 139 mmol/L (ref 135–145)

## 2023-03-07 LAB — BRAIN NATRIURETIC PEPTIDE: B Natriuretic Peptide: 2259.3 pg/mL — ABNORMAL HIGH (ref 0.0–100.0)

## 2023-03-07 MED ORDER — EMPAGLIFLOZIN 10 MG PO TABS
10.0000 mg | ORAL_TABLET | Freq: Every day | ORAL | 3 refills | Status: DC
  Filled 2023-03-07: qty 90, 90d supply, fill #0

## 2023-03-07 MED ORDER — SACUBITRIL-VALSARTAN 49-51 MG PO TABS
1.0000 | ORAL_TABLET | Freq: Two times a day (BID) | ORAL | 11 refills | Status: DC
  Filled 2023-03-07 – 2023-03-16 (×2): qty 60, 30d supply, fill #0

## 2023-03-07 MED ORDER — TORSEMIDE 20 MG PO TABS
40.0000 mg | ORAL_TABLET | Freq: Every day | ORAL | 11 refills | Status: AC
  Filled 2023-03-07: qty 60, 30d supply, fill #0
  Filled 2023-04-28: qty 60, 30d supply, fill #1
  Filled 2023-05-29: qty 60, 30d supply, fill #2
  Filled 2023-07-03: qty 60, 30d supply, fill #3
  Filled 2023-08-08: qty 60, 30d supply, fill #4
  Filled 2023-09-08: qty 60, 30d supply, fill #5
  Filled 2023-10-08: qty 60, 30d supply, fill #6
  Filled 2023-11-07: qty 60, 30d supply, fill #7
  Filled 2023-12-06: qty 60, 30d supply, fill #8
  Filled 2024-01-15: qty 60, 30d supply, fill #9
  Filled 2024-02-15 – 2024-02-25 (×2): qty 60, 30d supply, fill #10

## 2023-03-07 NOTE — Telephone Encounter (Signed)
Advanced Heart Failure Patient Advocate Encounter  Prior authorization for [place] [employer] has been submitted and approved. Test billing returns $4 for 90 day supply.  KeyFransico Setters Effective: 03/07/2023 to 03/06/2024  Burnell Blanks, CPhT Rx Patient Advocate Phone: [phone]

## 2023-03-07 NOTE — Patient Instructions (Addendum)
TAKE FUROSCIX TODAY AND THEN AGAIN TOMORROW  DO NOT TAKE YOUR TORSEMIDE WHILE YOU ARE USING THE FUROSCIX  ONCE YOU ARE DONE, INCREASE YOUR TORSEMIDE TO 40 MG ONCE DAILY  Please make sure you keep your pulmonology (lung doctor) appointment next week so they can address your oxygen.

## 2023-03-08 ENCOUNTER — Other Ambulatory Visit: Payer: Self-pay

## 2023-03-08 NOTE — Progress Notes (Unsigned)
PCP: Debera Lat, PA (last seen 10/24) Primary Cardiologist: Yvonne Kendall, MD (last saw 10/23)  Chief Complaint: shortness of breath  HPI:  Gregory Mckenzie is a 48 y/o male with a history of DM, hyperlipidemia, sleep apnea (on CPAP), HTN, paroxysmal atrial flutter, DM2, obesity, polycythemia, possible obesity hypoventilation syndrome, previous tobacco use and chronic heart failure.   Admitted to the hospital in 03/2020 with volume overload and uncontrolled hypertension in the 180s over 100s. Echo during that admission demonstrated an EF of 40 to 45%, global hypokinesis, severe concentric LVH, grade 2 diastolic dysfunction, normal PASP, severe biatrial enlargement, trivial mitral regurgitation, and an estimated right atrial pressure of 8 mmHg.  Lexiscan MPI was undertaken during that admission which showed no evidence of ischemia with extensive wall motion abnormalities including apparent dyskinesia involving the basilar aspect of the inferior wall of the LV with an EF of 31%.  He was diuresed and started on GDMT with symptomatic improvement. He was also noted to have a brief episode of paroxysmal atrial flutter and was initiated on Xarelto. He converted to sinus rhythm during the admission   He was lost to follow-up until readmission in 04/2021 with acute on chronic combined CHF.  High-sensitivity troponin peaking at 67.  BNP 990.  He was diuresed 26 L with subsequent improvement in symptoms and AKI leading to the holding of diuresis and GDMT.  With diuresis, BNP improved to 380.  Echo demonstrated a stable cardiomyopathy with an EF of 40 to 45%, global hypokinesis, mild LVH, grade 2 diastolic dysfunction, moderately reduced RV systolic function with mildly enlarged RV cavity size, moderate biatrial enlargement, mild mitral regurgitation, moderate to severe tricuspid regurgitation, and an estimated right atrial pressure of 3 mmHg. Hospital course was subsequently complicated by acute respiratory distress  requiring mechanical ventilation with concern for HAP with improvement following antibiotic therapy.  HTN runs in his family, father and brother had severe hypertension. Brother had a pulmonary embolism at age 36. Thinks his brother also had "thick blood" polycythemia.    Echo 05/15/21: EF of 40-45% along with mild LVH, moderate LAE, mild Gregory & moderate/severe TR.  Echo 10/20/22: EF 55-60% with severe LVH, normal PA pressure of 23.1 mmHg  LHC done 05/08/20 showed: Prox RCA lesion is 30% stenosed. Mid RCA lesion is 30% stenosed. Ost LAD lesion is 30% stenosed. Prox LAD lesion is 30% stenosed.  1. Mildly elevated filling pressures.  2. Mild pulmonary venous hypertension.  3. Nonobstructive CAD.   Nonischemic cardiomyopathy.   He presents today for a HF f/u visit with a chief complaint of moderate SOB with exertion although he says that it resolves quickly when he sits down. He has associated fatigue, 2 pillow orthopnea, abdominal distention and fatigue along with this. Denies chest pain, cough, pedal edema, dizziness or weight gain. Says that he feels a little better since last here 2 days ago.  Used 2 doses of furoscix since last here and picked up his jardiance, entresto & torsemide just yesterday.   Has had 2 family members die in the last few months, his brother (11/24) and mom (12/24). Reports increased stress as he's dealing with their estates and spends a lot of time driving back and forth to [facility]. Due to his increased travelling, he says that he's been eating more fast food and knows that he's been eating too much salt.   Did not wear his oxygen to the office but says that he's wearing it at 4L at home.   ROS:  All systems negative except as listed in HPI, PMH and Problem List.  SH:  Social History   Socioeconomic History   Marital status: Single    Spouse name: Not on file   Number of children: Not on file   Years of education: Not on file   Highest education level: Not on file   Occupational History   Not on file  Tobacco Use   Smoking status: Former    Current packs/day: 0.00    Average packs/day: 1 pack/day for 27.0 years (27.0 ttl pk-yrs)    Types: Cigarettes    Start date: 04/06/1993    Quit date: 04/06/2020    Years since quitting: 2.9   Smokeless tobacco: Never  Vaping Use   Vaping status: Never Used  Substance and Sexual Activity   Alcohol use: Not Currently   Drug use: Yes    Frequency: 7.0 times per week    Types: Marijuana    Comment: daily   Sexual activity: Yes    Partners: Female    Birth control/protection: None  Other Topics Concern   Not on file  Social History Narrative   Not on file   Social Drivers of Health   Financial Resource Strain: Low Risk  (04/09/2020)   Overall Financial Resource Strain (CARDIA)    Difficulty of Paying Living Expenses: Not very hard  Food Insecurity: No Food Insecurity (04/09/2020)   Hunger Vital Sign    Worried About Running Out of Food in the Last Year: Never true    Ran Out of Food in the Last Year: Never true  Transportation Needs: No Transportation Needs (04/09/2020)   PRAPARE - Administrator, Civil Service (Medical): No    Lack of Transportation (Non-Medical): No  Physical Activity: Inactive (04/09/2020)   Exercise Vital Sign    Days of Exercise per Week: 0 days    Minutes of Exercise per Session: 0 min  Stress: Not on file  Social Connections: Not on file  Intimate Partner Violence: Not on file    FH:  Family History  Problem Relation Age of Onset   Hypertension Father    Hypertension Brother    Pulmonary embolism Brother     Past Medical History:  Diagnosis Date   Atrial flutter (HCC)    Chronic combined systolic and diastolic CHF (congestive heart failure) (HCC)    Diabetes mellitus without complication (HCC)    Hyperlipidemia    Hypertension    Obesity    Sleep apnea, obstructive     Current Outpatient Medications  Medication Sig Dispense Refill   atorvastatin  (LIPITOR) 40 MG tablet Take 2 tablets (80 mg total) by mouth at bedtime. 180 tablet 0   carvedilol (COREG) 6.25 MG tablet Take 1 tablet (6.25 mg total) by mouth 2 (two) times daily with a meal. 180 tablet 3   empagliflozin (JARDIANCE) 10 MG TABS tablet Take 1 tablet (10 mg total) by mouth daily before breakfast. (Patient not taking: Reported on 03/07/2023) 30 tablet 3   empagliflozin (JARDIANCE) 10 MG TABS tablet Take 1 tablet (10 mg total) by mouth daily before breakfast. 90 tablet 3   OXYGEN Inhale 4 L into the lungs at bedtime. (Patient not taking: Reported on 03/07/2023)     rivaroxaban (XARELTO) 20 MG TABS tablet Take 1 tablet (20 mg total) by mouth daily with supper. 90 tablet 0   sacubitril-valsartan (ENTRESTO) 49-51 MG Take 1 tablet by mouth 2 (two) times daily. 60 tablet 11  spironolactone (ALDACTONE) 25 MG tablet Take 1 tablet (25 mg total) by mouth once daily. 90 tablet 3   torsemide (DEMADEX) 20 MG tablet Take 1 tablet (20 mg total) by mouth once daily. (Patient not taking: Reported on 03/07/2023) 90 tablet 3   torsemide (DEMADEX) 20 MG tablet Take 2 tablets (40 mg total) by mouth daily. 60 tablet 11   No current facility-administered medications for this visit.   Vitals:   03/09/23 0907 03/09/23 0928  BP: (!) 132/91   Pulse: 98   SpO2: (!) 75% (!) 88%  Weight: (!) 337 lb (152.9 kg)    Wt Readings from Last 3 Encounters:  03/09/23 (!) 337 lb (152.9 kg)  03/07/23 (!) 337 lb (152.9 kg)  10/26/22 (!) 303 lb (137.4 kg)   Lab Results  Component Value Date   CREATININE 1.45 (H) 03/07/2023   CREATININE 1.56 (H) 10/26/2022   CREATININE 1.61 (H) 10/12/2022   PHYSICAL EXAM:  General:  Well appearing. No resp difficulty HEENT: normal Neck: supple. JVP elevated but difficult to assess due to neck size. No lymphadenopathy or thryomegaly appreciated. Cor: PMI normal. Regular rhythm, rate. No rubs, gallops or murmurs. Lungs: clear Abdomen: soft, nontender, distended. No  hepatosplenomegaly. No bruits or masses.  Extremities: no cyanosis, clubbing, rash, 3+ pitting edema bilateral lower legs up to knees (no change) Neuro: alert & oriented x3, cranial nerves grossly intact. Moves all 4 extremities w/o difficulty. Affect pleasant.   ECG: not done   ASSESSMENT & PLAN:  1: Acute on chronic NICM with preserved ejection fraction- - suspect due to HTN / OSA as cath showed nonobstructive CAD - NYHA class III - continues to be moderately fluid overloaded with continued symptoms, edema and unchanged weight - weighing daily; reminded to call for an overnight weight gain of > 2 pounds or a weekly weight gain of > 5 pounds - weight unchanged from last visit here 2 days ago - used 2 doses of furoscix since last here & says that he feels "a little" better; now has his medications and has taken everything except his torsemide - will send for 80mg  IV lasix/ potassium today - BMET/ BNP today - echo 05/15/21: EF of 40-45% along with mild LVH, moderate LAE, mild Gregory & moderate/severe TR.  - echo 10/20/22: EF 55-60% with severe LVH, normal PA pressure of 23.1 mmHg - continue carvedilol 6.25mg  BID - continue ardiance 10mg  daily - continue entresto 49/51mg  BID - continue spironolactone 25mg  daily - continue torsemide 40mg  daily (take today once he gets home even after IV lasix if renal function is ok due to continued fluid overload sx) - LHC done 05/08/20 showed: Prox RCA lesion is 30% stenosed. Mid RCA lesion is 30% stenosed. Ost LAD lesion is 30% stenosed. Prox LAD lesion is 30% stenosed.  1. Mildly elevated filling pressures.  2. Mild pulmonary venous hypertension.  3. Nonobstructive CAD.   - not adding salt to his food but has been eating more fast food as he's travelling back and forth to [facility] - wearing compression socks daily with removal at bedtime - BNP 03/07/23 was 2259.3  2: HTN- - BP 132/91 - saw PCP (Gregory Mckenzie) 10/24 - BMP 03/07/23 reviewed and showed sodium  139, potassium 3.8, creatinine 1.45 and GFR 59  3: DM- - A1c 09/28/22 was 7.0.6% - currently diet controlled - continue atorvastatin 80mg  daily - LDL 10/26/22 was 64  4: Paroxysmal atrial flutter- - saw cardiology Gregory Mckenzie) 10/23; NS 03/24 as he didn't have  any insurance - continue xarelto 20mg  daily  5: Obstructive sleep apnea- - currently not wearing his CPAP as it's not working properly - has pulmonology appt next week & emphasized, again, that he keep this appt - says that he's wearing his oxygen at 4L at home but doesn't bring it to appt   Return in 4 days, sooner if needed.

## 2023-03-09 ENCOUNTER — Other Ambulatory Visit: Payer: Self-pay | Admitting: Family

## 2023-03-09 ENCOUNTER — Encounter: Payer: Self-pay | Admitting: Family

## 2023-03-09 ENCOUNTER — Ambulatory Visit (HOSPITAL_BASED_OUTPATIENT_CLINIC_OR_DEPARTMENT_OTHER): Payer: Medicaid Other | Admitting: Family

## 2023-03-09 ENCOUNTER — Ambulatory Visit
Admission: RE | Admit: 2023-03-09 | Discharge: 2023-03-09 | Disposition: A | Discharge status: Home / Self Care | Payer: Medicaid Other | Source: Ambulatory Visit | Attending: Family | Admitting: Family

## 2023-03-09 VITALS — BP 132/91 | HR 98 | Wt 337.0 lb

## 2023-03-09 DIAGNOSIS — G4733 Obstructive sleep apnea (adult) (pediatric): Secondary | ICD-10-CM | POA: Diagnosis not present

## 2023-03-09 DIAGNOSIS — I5033 Acute on chronic diastolic (congestive) heart failure: Secondary | ICD-10-CM | POA: Insufficient documentation

## 2023-03-09 DIAGNOSIS — I272 Pulmonary hypertension, unspecified: Secondary | ICD-10-CM | POA: Insufficient documentation

## 2023-03-09 DIAGNOSIS — Z79899 Other long term (current) drug therapy: Secondary | ICD-10-CM | POA: Diagnosis not present

## 2023-03-09 DIAGNOSIS — E785 Hyperlipidemia, unspecified: Secondary | ICD-10-CM | POA: Insufficient documentation

## 2023-03-09 DIAGNOSIS — Z8249 Family history of ischemic heart disease and other diseases of the circulatory system: Secondary | ICD-10-CM | POA: Insufficient documentation

## 2023-03-09 DIAGNOSIS — E669 Obesity, unspecified: Secondary | ICD-10-CM | POA: Insufficient documentation

## 2023-03-09 DIAGNOSIS — I4892 Unspecified atrial flutter: Secondary | ICD-10-CM | POA: Diagnosis not present

## 2023-03-09 DIAGNOSIS — I2510 Atherosclerotic heart disease of native coronary artery without angina pectoris: Secondary | ICD-10-CM | POA: Insufficient documentation

## 2023-03-09 DIAGNOSIS — I110 Hypertensive heart disease with heart failure: Secondary | ICD-10-CM | POA: Diagnosis present

## 2023-03-09 DIAGNOSIS — Z7901 Long term (current) use of anticoagulants: Secondary | ICD-10-CM | POA: Insufficient documentation

## 2023-03-09 DIAGNOSIS — I428 Other cardiomyopathies: Secondary | ICD-10-CM | POA: Insufficient documentation

## 2023-03-09 DIAGNOSIS — I10 Essential (primary) hypertension: Secondary | ICD-10-CM

## 2023-03-09 DIAGNOSIS — E119 Type 2 diabetes mellitus without complications: Secondary | ICD-10-CM | POA: Diagnosis not present

## 2023-03-09 DIAGNOSIS — N1831 Chronic kidney disease, stage 3a: Secondary | ICD-10-CM

## 2023-03-09 DIAGNOSIS — Z9981 Dependence on supplemental oxygen: Secondary | ICD-10-CM | POA: Diagnosis not present

## 2023-03-09 DIAGNOSIS — E1122 Type 2 diabetes mellitus with diabetic chronic kidney disease: Secondary | ICD-10-CM | POA: Diagnosis not present

## 2023-03-09 LAB — BASIC METABOLIC PANEL
Anion gap: 12 (ref 5–15)
BUN: 36 mg/dL — ABNORMAL HIGH (ref 6–20)
CO2: 28 mmol/L (ref 22–32)
Calcium: 8.8 mg/dL — ABNORMAL LOW (ref 8.9–10.3)
Chloride: 99 mmol/L (ref 98–111)
Creatinine, Ser: 1.69 mg/dL — ABNORMAL HIGH (ref 0.61–1.24)
GFR, Estimated: 49 mL/min — ABNORMAL LOW (ref >60)
Glucose, Bld: 158 mg/dL — ABNORMAL HIGH (ref 70–99)
Potassium: 3.7 mmol/L (ref 3.5–5.1)
Sodium: 139 mmol/L (ref 135–145)

## 2023-03-09 LAB — BRAIN NATRIURETIC PEPTIDE: B Natriuretic Peptide: 1857.9 pg/mL — ABNORMAL HIGH (ref 0.0–100.0)

## 2023-03-09 MED ORDER — POTASSIUM CHLORIDE CRYS ER 20 MEQ PO TBCR
EXTENDED_RELEASE_TABLET | ORAL | Status: AC
  Filled 2023-03-09: qty 2

## 2023-03-09 MED ORDER — FUROSEMIDE 10 MG/ML IJ SOLN
80.0000 mg | Freq: Once | INTRAMUSCULAR | Status: AC
  Administered 2023-03-09: 80 mg via INTRAVENOUS

## 2023-03-09 MED ORDER — FUROSEMIDE 10 MG/ML IJ SOLN
INTRAMUSCULAR | Status: AC
  Filled 2023-03-09: qty 8

## 2023-03-09 MED ORDER — POTASSIUM CHLORIDE CRYS ER 20 MEQ PO TBCR
40.0000 meq | EXTENDED_RELEASE_TABLET | Freq: Once | ORAL | Status: AC
  Administered 2023-03-09: 40 meq via ORAL

## 2023-03-09 NOTE — Progress Notes (Deleted)
 PCP: Debera Lat, PA (last seen 10/24) Primary Cardiologist: Yvonne Kendall, MD (last saw 10/23)  Chief Complaint:  HPI:  Gregory Mckenzie is a 48 y/o male with a history of DM, hyperlipidemia, sleep apnea (on CPAP), HTN, paroxysmal atrial flutter, DM2, obesity, polycythemia, possible obesity hypoventilation syndrome, previous tobacco use and chronic heart failure.   Admitted to the hospital in 03/2020 with volume overload and uncontrolled hypertension in the 180s over 100s. Echo during that admission demonstrated an EF of 40 to 45%, global hypokinesis, severe concentric LVH, grade 2 diastolic dysfunction, normal PASP, severe biatrial enlargement, trivial mitral regurgitation, and an estimated right atrial pressure of 8 mmHg.  Lexiscan MPI was undertaken during that admission which showed no evidence of ischemia with extensive wall motion abnormalities including apparent dyskinesia involving the basilar aspect of the inferior wall of the LV with an EF of 31%.  He was diuresed and started on GDMT with symptomatic improvement. He was also noted to have a brief episode of paroxysmal atrial flutter and was initiated on Xarelto. He converted to sinus rhythm during the admission   He was lost to follow-up until readmission in 04/2021 with acute on chronic combined CHF.  High-sensitivity troponin peaking at 67.  BNP 990.  He was diuresed 26 L with subsequent improvement in symptoms and AKI leading to the holding of diuresis and GDMT.  With diuresis, BNP improved to 380.  Echo demonstrated a stable cardiomyopathy with an EF of 40 to 45%, global hypokinesis, mild LVH, grade 2 diastolic dysfunction, moderately reduced RV systolic function with mildly enlarged RV cavity size, moderate biatrial enlargement, mild mitral regurgitation, moderate to severe tricuspid regurgitation, and an estimated right atrial pressure of 3 mmHg. Hospital course was subsequently complicated by acute respiratory distress requiring mechanical  ventilation with concern for HAP with improvement following antibiotic therapy.  HTN runs in his family, father and brother had severe hypertension. Brother had a pulmonary embolism at age 84. Thinks his brother also had "thick blood" polycythemia.    Echo 05/15/21: EF of 40-45% along with mild LVH, moderate LAE, mild Gregory & moderate/severe TR.  Echo 10/20/22: EF 55-60% with severe LVH, normal PA pressure of 23.1 mmHg  LHC done 05/08/20 showed: Prox RCA lesion is 30% stenosed. Mid RCA lesion is 30% stenosed. Ost LAD lesion is 30% stenosed. Prox LAD lesion is 30% stenosed.  1. Mildly elevated filling pressures.  2. Mild pulmonary venous hypertension.  3. Nonobstructive CAD.   Nonischemic cardiomyopathy.   He presents today for a HF f/u visit with a chief complaint of   Received 80mg  IV lasix/ PO potassium at last visit.  Has had 2 family members die in the last few months, his brother (11/24) and mom (12/24). Reports increased stress as he's dealing with their estates and spends a lot of time driving back and forth to [facility]. Due to his increased travelling, he says that he's been eating more fast food and knows that he's been eating too much salt.   Did not wear his oxygen to the office but says that he's wearing it at 4L at home.   ROS: All systems negative except as listed in HPI, PMH and Problem List.  SH:  Social History   Socioeconomic History   Marital status: Single    Spouse name: Not on file   Number of children: Not on file   Years of education: Not on file   Highest education level: Not on file  Occupational History  Not on file  Tobacco Use   Smoking status: Former    Current packs/day: 0.00    Average packs/day: 1 pack/day for 27.0 years (27.0 ttl pk-yrs)    Types: Cigarettes    Start date: 04/06/1993    Quit date: 04/06/2020    Years since quitting: 2.9   Smokeless tobacco: Never  Vaping Use   Vaping status: Never Used  Substance and Sexual Activity   Alcohol  use: Not Currently   Drug use: Yes    Frequency: 7.0 times per week    Types: Marijuana    Comment: daily   Sexual activity: Yes    Partners: Female    Birth control/protection: None  Other Topics Concern   Not on file  Social History Narrative   Not on file   Social Drivers of Health   Financial Resource Strain: Low Risk  (04/09/2020)   Overall Financial Resource Strain (CARDIA)    Difficulty of Paying Living Expenses: Not very hard  Food Insecurity: No Food Insecurity (04/09/2020)   Hunger Vital Sign    Worried About Running Out of Food in the Last Year: Never true    Ran Out of Food in the Last Year: Never true  Transportation Needs: No Transportation Needs (04/09/2020)   PRAPARE - Administrator, Civil Service (Medical): No    Lack of Transportation (Non-Medical): No  Physical Activity: Inactive (04/09/2020)   Exercise Vital Sign    Days of Exercise per Week: 0 days    Minutes of Exercise per Session: 0 min  Stress: Not on file  Social Connections: Not on file  Intimate Partner Violence: Not on file    FH:  Family History  Problem Relation Age of Onset   Hypertension Father    Hypertension Brother    Pulmonary embolism Brother     Past Medical History:  Diagnosis Date   Atrial flutter (HCC)    Chronic combined systolic and diastolic CHF (congestive heart failure) (HCC)    Diabetes mellitus without complication (HCC)    Hyperlipidemia    Hypertension    Obesity    Sleep apnea, obstructive     Current Outpatient Medications  Medication Sig Dispense Refill   atorvastatin (LIPITOR) 40 MG tablet Take 2 tablets (80 mg total) by mouth at bedtime. 180 tablet 0   carvedilol (COREG) 6.25 MG tablet Take 1 tablet (6.25 mg total) by mouth 2 (two) times daily with a meal. 180 tablet 3   empagliflozin (JARDIANCE) 10 MG TABS tablet Take 1 tablet (10 mg total) by mouth daily before breakfast. (Patient not taking: Reported on 03/09/2023) 30 tablet 3   empagliflozin  (JARDIANCE) 10 MG TABS tablet Take 1 tablet (10 mg total) by mouth daily before breakfast. 90 tablet 3   OXYGEN Inhale 4 L into the lungs at bedtime. (Patient not taking: Reported on 03/09/2023)     rivaroxaban (XARELTO) 20 MG TABS tablet Take 1 tablet (20 mg total) by mouth daily with supper. 90 tablet 0   sacubitril-valsartan (ENTRESTO) 49-51 MG Take 1 tablet by mouth 2 (two) times daily. 60 tablet 11   spironolactone (ALDACTONE) 25 MG tablet Take 1 tablet (25 mg total) by mouth once daily. 90 tablet 3   torsemide (DEMADEX) 20 MG tablet Take 2 tablets (40 mg total) by mouth daily. 60 tablet 11   No current facility-administered medications for this visit.     PHYSICAL EXAM:  General:  Well appearing. No resp difficulty HEENT: normal Neck:  supple. JVP elevated but difficult to assess due to neck size. No lymphadenopathy or thryomegaly appreciated. Cor: PMI normal. Regular rhythm, rate. No rubs, gallops or murmurs. Lungs: clear Abdomen: soft, nontender, distended. No hepatosplenomegaly. No bruits or masses.  Extremities: no cyanosis, clubbing, rash, 3+ pitting edema bilateral lower legs up to knees (no change) Neuro: alert & oriented x3, cranial nerves grossly intact. Moves all 4 extremities w/o difficulty. Affect pleasant.   ECG: not done   ASSESSMENT & PLAN:  1: Acute on chronic NICM with preserved ejection fraction- - suspect due to HTN / OSA as cath showed nonobstructive CAD - NYHA class III - continues to be moderately fluid overloaded with continued symptoms, edema and unchanged weight - weighing daily; reminded to call for an overnight weight gain of > 2 pounds or a weekly weight gain of > 5 pounds - weight 337 from last visit here 4 days ago - received 80mg  IV lasix/ potassium 4 days ago - BMET/ BNP today - echo 05/15/21: EF of 40-45% along with mild LVH, moderate LAE, mild Gregory & moderate/severe TR.  - echo 10/20/22: EF 55-60% with severe LVH, normal PA pressure of 23.1  mmHg - continue carvedilol 6.25mg  BID - continue ardiance 10mg  daily - continue entresto 49/51mg  BID - continue spironolactone 25mg  daily - continue torsemide 40mg  daily  - LHC done 05/08/20 showed: Prox RCA lesion is 30% stenosed. Mid RCA lesion is 30% stenosed. Ost LAD lesion is 30% stenosed. Prox LAD lesion is 30% stenosed.  1. Mildly elevated filling pressures.  2. Mild pulmonary venous hypertension.  3. Nonobstructive CAD.   - not adding salt to his food but has been eating more fast food as he's travelling back and forth to [facility] to deal with mom's estate - wearing compression socks daily with removal at bedtime - BNP 03/09/23 was 1857.9  2: HTN- - BP  - saw PCP (Ostwalt) 10/24 - BMP 03/09/23 reviewed and showed sodium 139, potassium 3.7, creatinine 1.69 and GFR 49  3: DM- - A1c 09/28/22 was 7.0.6% - currently diet controlled - continue atorvastatin 80mg  daily - LDL 10/26/22 was 64  4: Paroxysmal atrial flutter- - saw cardiology Fransico Michael) 10/23; NS 03/24 as he didn't have any insurance - continue xarelto 20mg  daily  5: Obstructive sleep apnea- - currently not wearing his CPAP as it's not working properly - has pulmonology appt next week & emphasized, again, that he keep this appt - says that he's wearing his oxygen at 4L at home but doesn't bring it to appt

## 2023-03-09 NOTE — Patient Instructions (Signed)
Take your torsemide when you get home

## 2023-03-10 ENCOUNTER — Other Ambulatory Visit: Payer: Self-pay

## 2023-03-13 ENCOUNTER — Encounter: Payer: Self-pay | Admitting: Pulmonary Disease

## 2023-03-13 ENCOUNTER — Encounter: Payer: Medicaid Other | Admitting: Family

## 2023-03-13 ENCOUNTER — Ambulatory Visit (INDEPENDENT_AMBULATORY_CARE_PROVIDER_SITE_OTHER): Payer: Medicaid Other | Admitting: Pulmonary Disease

## 2023-03-13 VITALS — BP 120/82 | HR 53 | Ht 66.0 in | Wt 336.2 lb

## 2023-03-13 DIAGNOSIS — G4733 Obstructive sleep apnea (adult) (pediatric): Secondary | ICD-10-CM

## 2023-03-13 DIAGNOSIS — J9601 Acute respiratory failure with hypoxia: Secondary | ICD-10-CM

## 2023-03-13 DIAGNOSIS — E119 Type 2 diabetes mellitus without complications: Secondary | ICD-10-CM

## 2023-03-13 NOTE — Patient Instructions (Signed)
Schedule for in-lab split-night study  History of obstructive sleep apnea, machine not working  Trying to get the schedule done ASAP  Tentative follow-up in about 2 to 3 months

## 2023-03-13 NOTE — Progress Notes (Signed)
Gregory Mckenzie    [id number]    1975/03/27  Primary Care Physician:Patient, No Pcp Per  Referring Physician: Delma Freeze, FNP [address] [place],  [place] [address]  Chief complaint:   Patient being seen for evaluation for sleep apnea  HPI:  Does have a history of obstructive sleep apnea, obesity hypoventilation Had a machine that he used in the past, machine became dysfunctional about 6 months ago  He does have chronic hypoxemic respiratory failure for which he uses oxygen supplementation at about 4 L, also does use oxygen at night at present  He is short of breath with most activities  Usually goes to bed about 930 wake up time about 72  Mother does have obstructive sleep apnea, also has a brother with obstructive sleep apnea  His previous machine was donated when he left the hospital in April 2023  He does not sleep through the night wakes up after a couple of hours to use the bathroom as he is on Lasix  Reformed smoker, quit in 2023    Outpatient Encounter Medications as of 03/13/2023  Medication Sig   atorvastatin (LIPITOR) 40 MG tablet Take 2 tablets (80 mg total) by mouth at bedtime.   carvedilol (COREG) 6.25 MG tablet Take 1 tablet (6.25 mg total) by mouth 2 (two) times daily with a meal.   empagliflozin (JARDIANCE) 10 MG TABS tablet Take 1 tablet (10 mg total) by mouth daily before breakfast.   empagliflozin (JARDIANCE) 10 MG TABS tablet Take 1 tablet (10 mg total) by mouth daily before breakfast.   OXYGEN Inhale 4 L into the lungs at bedtime.   rivaroxaban (XARELTO) 20 MG TABS tablet Take 1 tablet (20 mg total) by mouth daily with supper.   sacubitril-valsartan (ENTRESTO) 49-51 MG Take 1 tablet by mouth 2 (two) times daily.   spironolactone (ALDACTONE) 25 MG tablet Take 1 tablet (25 mg total) by mouth once daily.   torsemide (DEMADEX) 20 MG tablet Take 2 tablets (40 mg total) by mouth daily.   No facility-administered  encounter medications on file as of 03/13/2023.    Allergies as of 03/13/2023   (No Known Allergies)    Past Medical History:  Diagnosis Date   Atrial flutter (HCC)    Chronic combined systolic and diastolic CHF (congestive heart failure) (HCC)    Diabetes mellitus without complication (HCC)    Hyperlipidemia    Hypertension    Obesity    Sleep apnea, obstructive     Past Surgical History:  Procedure Laterality Date   RIGHT/LEFT HEART CATH AND CORONARY ANGIOGRAPHY N/A 05/08/2020   Procedure: RIGHT/LEFT HEART CATH AND CORONARY ANGIOGRAPHY;  Surgeon: Laurey Morale, MD;  Location: [facility] INVASIVE CV LAB;  Service: Cardiovascular;  Laterality: N/A;    Family History  Problem Relation Age of Onset   Hypertension Father    Hypertension Brother    Pulmonary embolism Brother     Social History   Socioeconomic History   Marital status: Single    Spouse name: Not on file   Number of children: Not on file   Years of education: Not on file   Highest education level: Not on file  Occupational History   Not on file  Tobacco Use   Smoking status: Former    Current packs/day: 0.00    Average packs/day: 1 pack/day for 27.0 years (27.0 ttl pk-yrs)    Types: Cigarettes    Start date:  04/06/1993    Quit date: 04/06/2020    Years since quitting: 2.9   Smokeless tobacco: Never  Vaping Use   Vaping status: Never Used  Substance and Sexual Activity   Alcohol use: Not Currently   Drug use: Yes    Frequency: 7.0 times per week    Types: Marijuana    Comment: daily   Sexual activity: Yes    Partners: Female    Birth control/protection: None  Other Topics Concern   Not on file  Social History Narrative   Not on file   Social Drivers of Health   Financial Resource Strain: Low Risk  (04/09/2020)   Overall Financial Resource Strain (CARDIA)    Difficulty of Paying Living Expenses: Not very hard  Food Insecurity: No Food Insecurity (04/09/2020)   Hunger Vital Sign    Worried About  Running Out of Food in the Last Year: Never true    Ran Out of Food in the Last Year: Never true  Transportation Needs: No Transportation Needs (04/09/2020)   PRAPARE - Administrator, Civil Service (Medical): No    Lack of Transportation (Non-Medical): No  Physical Activity: Inactive (04/09/2020)   Exercise Vital Sign    Days of Exercise per Week: 0 days    Minutes of Exercise per Session: 0 min  Stress: Not on file  Social Connections: Not on file  Intimate Partner Violence: Not on file    Review of Systems  Constitutional:  Positive for fatigue.  Respiratory:  Positive for apnea and shortness of breath.   Psychiatric/Behavioral:  Positive for sleep disturbance.     There were no vitals filed for this visit.   Physical Exam Constitutional:      Appearance: He is obese.  HENT:     Head: Normocephalic.     Mouth/Throat:     Mouth: Mucous membranes are moist.  Eyes:     General: No scleral icterus. Cardiovascular:     Rate and Rhythm: Normal rate and regular rhythm.     Heart sounds: No murmur heard. Pulmonary:     Effort: No respiratory distress.     Breath sounds: No stridor. No wheezing or rhonchi.  Musculoskeletal:     Cervical back: No rigidity or tenderness.  Neurological:     Mental Status: He is alert.  Psychiatric:        Mood and Affect: Mood normal.      Data Reviewed: Hospitalization records from 2023 reviewed  Echocardiogram 10/20/2022 reviewed showing normal ejection fraction, severe left ventricular hypertrophy, indeterminate left ventricular diastolic parameters, no evidence of valvulopathy  Last chest x-ray on record April 2023-hypoventilated lung fields with atelectasis at the bases  Assessment:  History of obstructive sleep apnea, not currently on CPAP therapy -States patient became dysfunctional about 6 months ago  Chronic hypoxemic respiratory failure -Continue oxygen supplementation  History of heart failure Combined systolic  and diastolic heart failure  History of atrial flutter  Class III obesity  Obesity hypoventilation syndrome  Type 2 diabetes  Plan/Recommendations: Schedule for in-lab split-night study  Encourage weight loss efforts  Continue oxygen supplementation  Tentative follow-up in 2 to 3 months  Encouraged to call with any significant concerns   Virl Diamond MD [facility] 03/13/2023, 10:37 AM  CC: Delma Freeze, FNP

## 2023-03-16 ENCOUNTER — Other Ambulatory Visit: Payer: Self-pay | Admitting: Family

## 2023-03-16 ENCOUNTER — Other Ambulatory Visit: Payer: Self-pay

## 2023-03-16 MED ORDER — ATORVASTATIN CALCIUM 40 MG PO TABS
80.0000 mg | ORAL_TABLET | Freq: Every day | ORAL | 1 refills | Status: DC
  Filled 2023-03-16: qty 180, 90d supply, fill #0
  Filled 2023-06-23: qty 180, 90d supply, fill #1

## 2023-03-16 MED FILL — Carvedilol Tab 6.25 MG: ORAL | 30 days supply | Qty: 60 | Fill #3 | Status: AC

## 2023-03-16 MED FILL — Spironolactone Tab 25 MG: ORAL | 30 days supply | Qty: 30 | Fill #3 | Status: AC

## 2023-03-20 ENCOUNTER — Encounter: Payer: Self-pay | Admitting: Family

## 2023-03-20 ENCOUNTER — Inpatient Hospital Stay: Payer: Medicaid Other

## 2023-03-20 ENCOUNTER — Inpatient Hospital Stay
Admission: EM | Admit: 2023-03-20 | Discharge: 2023-04-03 | DRG: 207 | Disposition: A | Discharge status: Home / Self Care | Payer: Medicaid Other | Source: Ambulatory Visit | Attending: Internal Medicine | Admitting: Internal Medicine

## 2023-03-20 ENCOUNTER — Telehealth: Payer: Self-pay | Admitting: Pulmonary Disease

## 2023-03-20 ENCOUNTER — Other Ambulatory Visit: Payer: Self-pay

## 2023-03-20 ENCOUNTER — Ambulatory Visit (HOSPITAL_BASED_OUTPATIENT_CLINIC_OR_DEPARTMENT_OTHER): Payer: Medicaid Other | Admitting: Family

## 2023-03-20 ENCOUNTER — Emergency Department: Payer: Medicaid Other

## 2023-03-20 ENCOUNTER — Encounter: Payer: Self-pay | Admitting: Emergency Medicine

## 2023-03-20 VITALS — BP 116/55 | HR 87 | Wt 343.0 lb

## 2023-03-20 DIAGNOSIS — Z1152 Encounter for screening for COVID-19: Secondary | ICD-10-CM

## 2023-03-20 DIAGNOSIS — E669 Obesity, unspecified: Secondary | ICD-10-CM | POA: Insufficient documentation

## 2023-03-20 DIAGNOSIS — E8729 Other acidosis: Secondary | ICD-10-CM | POA: Diagnosis present

## 2023-03-20 DIAGNOSIS — I4892 Unspecified atrial flutter: Secondary | ICD-10-CM | POA: Diagnosis present

## 2023-03-20 DIAGNOSIS — I272 Pulmonary hypertension, unspecified: Secondary | ICD-10-CM | POA: Diagnosis present

## 2023-03-20 DIAGNOSIS — E785 Hyperlipidemia, unspecified: Secondary | ICD-10-CM | POA: Insufficient documentation

## 2023-03-20 DIAGNOSIS — J9621 Acute and chronic respiratory failure with hypoxia: Principal | ICD-10-CM | POA: Diagnosis present

## 2023-03-20 DIAGNOSIS — E662 Morbid (severe) obesity with alveolar hypoventilation: Secondary | ICD-10-CM | POA: Diagnosis present

## 2023-03-20 DIAGNOSIS — Z6841 Body Mass Index (BMI) 40.0 and over, adult: Secondary | ICD-10-CM | POA: Diagnosis not present

## 2023-03-20 DIAGNOSIS — E119 Type 2 diabetes mellitus without complications: Secondary | ICD-10-CM | POA: Insufficient documentation

## 2023-03-20 DIAGNOSIS — I428 Other cardiomyopathies: Secondary | ICD-10-CM | POA: Insufficient documentation

## 2023-03-20 DIAGNOSIS — I509 Heart failure, unspecified: Secondary | ICD-10-CM

## 2023-03-20 DIAGNOSIS — D751 Secondary polycythemia: Secondary | ICD-10-CM | POA: Diagnosis present

## 2023-03-20 DIAGNOSIS — Z7901 Long term (current) use of anticoagulants: Secondary | ICD-10-CM

## 2023-03-20 DIAGNOSIS — Z79899 Other long term (current) drug therapy: Secondary | ICD-10-CM

## 2023-03-20 DIAGNOSIS — J9601 Acute respiratory failure with hypoxia: Secondary | ICD-10-CM | POA: Diagnosis not present

## 2023-03-20 DIAGNOSIS — J811 Chronic pulmonary edema: Secondary | ICD-10-CM | POA: Diagnosis not present

## 2023-03-20 DIAGNOSIS — Z87891 Personal history of nicotine dependence: Secondary | ICD-10-CM

## 2023-03-20 DIAGNOSIS — I50810 Right heart failure, unspecified: Secondary | ICD-10-CM | POA: Diagnosis not present

## 2023-03-20 DIAGNOSIS — I5082 Biventricular heart failure: Secondary | ICD-10-CM | POA: Diagnosis present

## 2023-03-20 DIAGNOSIS — E1122 Type 2 diabetes mellitus with diabetic chronic kidney disease: Secondary | ICD-10-CM | POA: Diagnosis present

## 2023-03-20 DIAGNOSIS — E875 Hyperkalemia: Secondary | ICD-10-CM | POA: Diagnosis not present

## 2023-03-20 DIAGNOSIS — G4733 Obstructive sleep apnea (adult) (pediatric): Secondary | ICD-10-CM | POA: Insufficient documentation

## 2023-03-20 DIAGNOSIS — Z9981 Dependence on supplemental oxygen: Secondary | ICD-10-CM

## 2023-03-20 DIAGNOSIS — N179 Acute kidney failure, unspecified: Secondary | ICD-10-CM | POA: Diagnosis present

## 2023-03-20 DIAGNOSIS — I480 Paroxysmal atrial fibrillation: Secondary | ICD-10-CM | POA: Insufficient documentation

## 2023-03-20 DIAGNOSIS — J9622 Acute and chronic respiratory failure with hypercapnia: Secondary | ICD-10-CM | POA: Diagnosis present

## 2023-03-20 DIAGNOSIS — G928 Other toxic encephalopathy: Secondary | ICD-10-CM | POA: Diagnosis present

## 2023-03-20 DIAGNOSIS — E876 Hypokalemia: Secondary | ICD-10-CM | POA: Diagnosis not present

## 2023-03-20 DIAGNOSIS — E118 Type 2 diabetes mellitus with unspecified complications: Secondary | ICD-10-CM | POA: Diagnosis present

## 2023-03-20 DIAGNOSIS — N189 Chronic kidney disease, unspecified: Secondary | ICD-10-CM | POA: Diagnosis not present

## 2023-03-20 DIAGNOSIS — R570 Cardiogenic shock: Secondary | ICD-10-CM

## 2023-03-20 DIAGNOSIS — I5023 Acute on chronic systolic (congestive) heart failure: Secondary | ICD-10-CM | POA: Diagnosis present

## 2023-03-20 DIAGNOSIS — N1831 Chronic kidney disease, stage 3a: Secondary | ICD-10-CM | POA: Diagnosis present

## 2023-03-20 DIAGNOSIS — Z91118 Patient's noncompliance with dietary regimen for other reason: Secondary | ICD-10-CM

## 2023-03-20 DIAGNOSIS — I2510 Atherosclerotic heart disease of native coronary artery without angina pectoris: Secondary | ICD-10-CM | POA: Diagnosis present

## 2023-03-20 DIAGNOSIS — I130 Hypertensive heart and chronic kidney disease with heart failure and stage 1 through stage 4 chronic kidney disease, or unspecified chronic kidney disease: Secondary | ICD-10-CM | POA: Diagnosis present

## 2023-03-20 DIAGNOSIS — I5033 Acute on chronic diastolic (congestive) heart failure: Secondary | ICD-10-CM | POA: Insufficient documentation

## 2023-03-20 DIAGNOSIS — I5031 Acute diastolic (congestive) heart failure: Secondary | ICD-10-CM | POA: Diagnosis not present

## 2023-03-20 DIAGNOSIS — R0902 Hypoxemia: Principal | ICD-10-CM

## 2023-03-20 DIAGNOSIS — R509 Fever, unspecified: Secondary | ICD-10-CM | POA: Diagnosis not present

## 2023-03-20 DIAGNOSIS — E66813 Obesity, class 3: Secondary | ICD-10-CM

## 2023-03-20 DIAGNOSIS — I10 Essential (primary) hypertension: Secondary | ICD-10-CM

## 2023-03-20 DIAGNOSIS — Z794 Long term (current) use of insulin: Secondary | ICD-10-CM

## 2023-03-20 DIAGNOSIS — G9341 Metabolic encephalopathy: Secondary | ICD-10-CM | POA: Diagnosis not present

## 2023-03-20 DIAGNOSIS — Z7984 Long term (current) use of oral hypoglycemic drugs: Secondary | ICD-10-CM

## 2023-03-20 DIAGNOSIS — I110 Hypertensive heart disease with heart failure: Secondary | ICD-10-CM | POA: Insufficient documentation

## 2023-03-20 DIAGNOSIS — I2489 Other forms of acute ischemic heart disease: Secondary | ICD-10-CM | POA: Diagnosis present

## 2023-03-20 DIAGNOSIS — R5381 Other malaise: Secondary | ICD-10-CM | POA: Diagnosis present

## 2023-03-20 DIAGNOSIS — R791 Abnormal coagulation profile: Secondary | ICD-10-CM | POA: Diagnosis present

## 2023-03-20 DIAGNOSIS — I214 Non-ST elevation (NSTEMI) myocardial infarction: Secondary | ICD-10-CM

## 2023-03-20 DIAGNOSIS — Z8249 Family history of ischemic heart disease and other diseases of the circulatory system: Secondary | ICD-10-CM

## 2023-03-20 DIAGNOSIS — R7989 Other specified abnormal findings of blood chemistry: Secondary | ICD-10-CM

## 2023-03-20 DIAGNOSIS — I5043 Acute on chronic combined systolic (congestive) and diastolic (congestive) heart failure: Secondary | ICD-10-CM | POA: Diagnosis not present

## 2023-03-20 DIAGNOSIS — I5020 Unspecified systolic (congestive) heart failure: Secondary | ICD-10-CM | POA: Diagnosis not present

## 2023-03-20 DIAGNOSIS — I2609 Other pulmonary embolism with acute cor pulmonale: Secondary | ICD-10-CM | POA: Diagnosis not present

## 2023-03-20 DIAGNOSIS — Z5986 Financial insecurity: Secondary | ICD-10-CM

## 2023-03-20 DIAGNOSIS — J810 Acute pulmonary edema: Secondary | ICD-10-CM | POA: Diagnosis not present

## 2023-03-20 DIAGNOSIS — R1310 Dysphagia, unspecified: Secondary | ICD-10-CM

## 2023-03-20 DIAGNOSIS — Z91148 Patient's other noncompliance with medication regimen for other reason: Secondary | ICD-10-CM

## 2023-03-20 DIAGNOSIS — Z5320 Procedure and treatment not carried out because of patient's decision for unspecified reasons: Secondary | ICD-10-CM | POA: Diagnosis present

## 2023-03-20 DIAGNOSIS — J9602 Acute respiratory failure with hypercapnia: Secondary | ICD-10-CM | POA: Diagnosis not present

## 2023-03-20 HISTORY — DX: Other cardiomyopathies: I42.8

## 2023-03-20 HISTORY — DX: Atherosclerotic heart disease of native coronary artery without angina pectoris: I25.10

## 2023-03-20 HISTORY — DX: Patient's other noncompliance with medication regimen for other reason: Z91.148

## 2023-03-20 LAB — BRAIN NATRIURETIC PEPTIDE: B Natriuretic Peptide: 1596.6 pg/mL — ABNORMAL HIGH (ref 0.0–100.0)

## 2023-03-20 LAB — BASIC METABOLIC PANEL
Anion gap: 12 (ref 5–15)
BUN: 44 mg/dL — ABNORMAL HIGH (ref 6–20)
CO2: 32 mmol/L (ref 22–32)
Calcium: 8.9 mg/dL (ref 8.9–10.3)
Chloride: 96 mmol/L — ABNORMAL LOW (ref 98–111)
Creatinine, Ser: 2.19 mg/dL — ABNORMAL HIGH (ref 0.61–1.24)
GFR, Estimated: 36 mL/min — ABNORMAL LOW (ref >60)
Glucose, Bld: 147 mg/dL — ABNORMAL HIGH (ref 70–99)
Potassium: 4.8 mmol/L (ref 3.5–5.1)
Sodium: 140 mmol/L (ref 135–145)

## 2023-03-20 LAB — BLOOD GAS, VENOUS
Acid-Base Excess: 6.9 mmol/L — ABNORMAL HIGH (ref 0.0–2.0)
Bicarbonate: 39 mmol/L — ABNORMAL HIGH (ref 20.0–28.0)
O2 Saturation: 58.6 %
Patient temperature: 37
pCO2, Ven: 102 mm[Hg] (ref 44–60)
pH, Ven: 7.19 — CL (ref 7.25–7.43)
pO2, Ven: 36 mm[Hg] (ref 32–45)

## 2023-03-20 LAB — RESP PANEL BY RT-PCR (RSV, FLU A&B, COVID)  RVPGX2
Influenza A by PCR: NEGATIVE
Influenza B by PCR: NEGATIVE
Resp Syncytial Virus by PCR: NEGATIVE
SARS Coronavirus 2 by RT PCR: NEGATIVE

## 2023-03-20 LAB — PROTIME-INR
INR: 2 — ABNORMAL HIGH (ref 0.8–1.2)
Prothrombin Time: 23.3 s — ABNORMAL HIGH (ref 11.4–15.2)

## 2023-03-20 LAB — GLUCOSE, CAPILLARY: Glucose-Capillary: 131 mg/dL — ABNORMAL HIGH (ref 70–99)

## 2023-03-20 LAB — CBC
HCT: 61.2 % — ABNORMAL HIGH (ref 39.0–52.0)
Hemoglobin: 18.9 g/dL — ABNORMAL HIGH (ref 13.0–17.0)
MCH: 28.9 pg (ref 26.0–34.0)
MCHC: 30.9 g/dL (ref 30.0–36.0)
MCV: 93.7 fL (ref 80.0–100.0)
Platelets: 175 10*3/uL (ref 150–400)
RBC: 6.53 MIL/uL — ABNORMAL HIGH (ref 4.22–5.81)
RDW: 21.6 % — ABNORMAL HIGH (ref 11.5–15.5)
WBC: 4.2 10*3/uL (ref 4.0–10.5)
nRBC: 2.4 % — ABNORMAL HIGH (ref 0.0–0.2)

## 2023-03-20 LAB — TROPONIN I (HIGH SENSITIVITY)
Troponin I (High Sensitivity): 122 ng/L (ref <18)
Troponin I (High Sensitivity): 152 ng/L (ref <18)
Troponin I (High Sensitivity): 121 ng/L (ref <18)

## 2023-03-20 LAB — CBG MONITORING, ED: Glucose-Capillary: 107 mg/dL — ABNORMAL HIGH (ref 70–99)

## 2023-03-20 LAB — HIV ANTIBODY (ROUTINE TESTING W REFLEX): HIV Screen 4th Generation wRfx: NONREACTIVE

## 2023-03-20 MED ORDER — RIVAROXABAN 20 MG PO TABS
20.0000 mg | ORAL_TABLET | Freq: Every day | ORAL | Status: DC
  Administered 2023-03-20: 20 mg via ORAL
  Filled 2023-03-20: qty 1

## 2023-03-20 MED ORDER — FENTANYL CITRATE PF 50 MCG/ML IJ SOSY
50.0000 ug | PREFILLED_SYRINGE | Freq: Once | INTRAMUSCULAR | Status: AC
Start: 2023-03-20 — End: 2023-03-20
  Administered 2023-03-20: 50 ug via INTRAVENOUS

## 2023-03-20 MED ORDER — CARVEDILOL 6.25 MG PO TABS
6.2500 mg | ORAL_TABLET | Freq: Two times a day (BID) | ORAL | Status: DC
  Administered 2023-03-20: 6.25 mg via ORAL
  Filled 2023-03-20: qty 1

## 2023-03-20 MED ORDER — ATORVASTATIN CALCIUM 80 MG PO TABS
80.0000 mg | ORAL_TABLET | Freq: Every day | ORAL | Status: DC
  Administered 2023-03-22 – 2023-03-26 (×5): 80 mg via ORAL
  Filled 2023-03-20 (×5): qty 1

## 2023-03-20 MED ORDER — ETOMIDATE 2 MG/ML IV SOLN
INTRAVENOUS | Status: AC
  Administered 2023-03-20: 20 mg
  Filled 2023-03-20: qty 10

## 2023-03-20 MED ORDER — NOREPINEPHRINE 4 MG/250ML-% IV SOLN
INTRAVENOUS | Status: AC
  Administered 2023-03-20: 4 mg via INTRAVENOUS
  Filled 2023-03-20: qty 250

## 2023-03-20 MED ORDER — TORSEMIDE 20 MG PO TABS
40.0000 mg | ORAL_TABLET | Freq: Every day | ORAL | Status: DC
  Administered 2023-03-20: 40 mg via ORAL
  Filled 2023-03-20: qty 2

## 2023-03-20 MED ORDER — ONDANSETRON HCL 4 MG PO TABS: 4.0000 mg | ORAL_TABLET | Freq: Four times a day (QID) | ORAL | Status: DC | PRN

## 2023-03-20 MED ORDER — FENTANYL BOLUS VIA INFUSION
50.0000 ug | INTRAVENOUS | Status: DC | PRN
  Administered 2023-03-23 – 2023-03-26 (×3): 50 ug via INTRAVENOUS
  Administered 2023-03-26 – 2023-03-27 (×2): 100 ug via INTRAVENOUS

## 2023-03-20 MED ORDER — SODIUM CHLORIDE 0.9% FLUSH: 3.0000 mL | INTRAVENOUS | Status: DC | PRN

## 2023-03-20 MED ORDER — SODIUM CHLORIDE 0.9 % IV SOLN: 250.0000 mL | INTRAVENOUS | Status: DC | PRN

## 2023-03-20 MED ORDER — FENTANYL 2500MCG IN NS 250ML (10MCG/ML) PREMIX INFUSION
50.0000 ug/h | INTRAVENOUS | Status: DC
  Administered 2023-03-20: 50 ug/h via INTRAVENOUS
  Administered 2023-03-21 – 2023-03-22 (×3): 150 ug/h via INTRAVENOUS
  Administered 2023-03-23: 75 ug/h via INTRAVENOUS
  Administered 2023-03-24: 100 ug/h via INTRAVENOUS
  Administered 2023-03-25 – 2023-03-27 (×3): 150 ug/h via INTRAVENOUS
  Filled 2023-03-20 (×9): qty 250

## 2023-03-20 MED ORDER — CHLORHEXIDINE GLUCONATE CLOTH 2 % EX PADS
6.0000 | MEDICATED_PAD | Freq: Every day | CUTANEOUS | Status: DC
  Administered 2023-03-21 – 2023-04-03 (×14): 6 via TOPICAL

## 2023-03-20 MED ORDER — DEXMEDETOMIDINE HCL IN NACL 400 MCG/100ML IV SOLN
0.0000 ug/kg/h | INTRAVENOUS | Status: DC
  Administered 2023-03-20: 0.4 ug/kg/h via INTRAVENOUS
  Administered 2023-03-21: 0.5 ug/kg/h via INTRAVENOUS
  Filled 2023-03-20 (×2): qty 100

## 2023-03-20 MED ORDER — IPRATROPIUM-ALBUTEROL 0.5-2.5 (3) MG/3ML IN SOLN
3.0000 mL | Freq: Four times a day (QID) | RESPIRATORY_TRACT | Status: DC
  Administered 2023-03-20 – 2023-03-30 (×39): 3 mL via RESPIRATORY_TRACT
  Filled 2023-03-20 (×38): qty 3

## 2023-03-20 MED ORDER — SODIUM CHLORIDE 0.9% FLUSH
3.0000 mL | Freq: Two times a day (BID) | INTRAVENOUS | Status: DC
  Administered 2023-03-20 (×2): 3 mL via INTRAVENOUS

## 2023-03-20 MED ORDER — SODIUM CHLORIDE 0.9 % IV SOLN
500.0000 mg | INTRAVENOUS | Status: AC
  Administered 2023-03-20: 500 mg via INTRAVENOUS
  Filled 2023-03-20: qty 5

## 2023-03-20 MED ORDER — ROCURONIUM BROMIDE 10 MG/ML (PF) SYRINGE
PREFILLED_SYRINGE | INTRAVENOUS | Status: AC
  Administered 2023-03-20: 100 mg
  Filled 2023-03-20: qty 10

## 2023-03-20 MED ORDER — AZITHROMYCIN 250 MG PO TABS: 500.0000 mg | ORAL_TABLET | Freq: Every day | ORAL | Status: DC

## 2023-03-20 MED ORDER — PROPOFOL 1000 MG/100ML IV EMUL
INTRAVENOUS | Status: AC
  Filled 2023-03-20: qty 100

## 2023-03-20 MED ORDER — SPIRONOLACTONE 25 MG PO TABS
25.0000 mg | ORAL_TABLET | Freq: Every day | ORAL | Status: DC
  Administered 2023-03-20: 25 mg via ORAL
  Filled 2023-03-20: qty 1

## 2023-03-20 MED ORDER — ONDANSETRON HCL 4 MG/2ML IJ SOLN: 4.0000 mg | Freq: Four times a day (QID) | INTRAMUSCULAR | Status: DC | PRN

## 2023-03-20 MED ORDER — FUROSEMIDE 10 MG/ML IJ SOLN
80.0000 mg | Freq: Three times a day (TID) | INTRAMUSCULAR | Status: DC
  Administered 2023-03-20: 80 mg via INTRAVENOUS
  Filled 2023-03-20: qty 8

## 2023-03-20 MED ORDER — INSULIN ASPART 100 UNIT/ML IJ SOLN: 3.0000 [IU] | Freq: Three times a day (TID) | INTRAMUSCULAR | Status: DC

## 2023-03-20 MED ORDER — SODIUM BICARBONATE 8.4 % IV SOLN
INTRAVENOUS | Status: AC
  Administered 2023-03-20: 50 meq
  Filled 2023-03-20: qty 100

## 2023-03-20 MED ORDER — PREDNISONE 20 MG PO TABS: 40.0000 mg | ORAL_TABLET | Freq: Every day | ORAL | Status: DC

## 2023-03-20 MED ORDER — INSULIN ASPART 100 UNIT/ML IJ SOLN: 0.0000 [IU] | Freq: Three times a day (TID) | INTRAMUSCULAR | Status: DC

## 2023-03-20 MED ORDER — ALBUTEROL SULFATE (2.5 MG/3ML) 0.083% IN NEBU: 2.5000 mg | INHALATION_SOLUTION | RESPIRATORY_TRACT | Status: DC | PRN

## 2023-03-20 NOTE — Assessment & Plan Note (Signed)
Glucose 140s SSI A1c Monitor blood sugars with steroid use

## 2023-03-20 NOTE — Progress Notes (Signed)
PCP: Debera Lat, PA (last seen 10/24) Primary Cardiologist: Yvonne Kendall, MD (last saw 10/23)  Chief Complaint: shortness of breath  HPI:  Mr Paradis is a 48 y/o male with a history of DM, hyperlipidemia, sleep apnea (CPAP broken), HTN, paroxysmal atrial flutter, DM2, obesity, polycythemia, possible obesity hypoventilation syndrome, previous tobacco use and chronic heart failure.   Admitted to the hospital in 03/2020 with volume overload and uncontrolled hypertension in the 180s over 100s. Echo during that admission demonstrated an EF of 40 to 45%, global hypokinesis, severe concentric LVH, grade 2 diastolic dysfunction, normal PASP, severe biatrial enlargement, trivial mitral regurgitation, and an estimated right atrial pressure of 8 mmHg.  Lexiscan MPI was undertaken during that admission which showed no evidence of ischemia with extensive wall motion abnormalities including apparent dyskinesia involving the basilar aspect of the inferior wall of the LV with an EF of 31%.  He was diuresed and started on GDMT with symptomatic improvement. He was also noted to have a brief episode of paroxysmal atrial flutter and was initiated on Xarelto. He converted to sinus rhythm during the admission   He was lost to follow-up until readmission in 04/2021 with acute on chronic combined CHF.  High-sensitivity troponin peaking at 67.  BNP 990.  He was diuresed 26 L with subsequent improvement in symptoms and AKI leading to the holding of diuresis and GDMT.  With diuresis, BNP improved to 380.  Echo demonstrated a stable cardiomyopathy with an EF of 40 to 45%, global hypokinesis, mild LVH, grade 2 diastolic dysfunction, moderately reduced RV systolic function with mildly enlarged RV cavity size, moderate biatrial enlargement, mild mitral regurgitation, moderate to severe tricuspid regurgitation, and an estimated right atrial pressure of 3 mmHg. Hospital course was subsequently complicated by acute respiratory  distress requiring mechanical ventilation with concern for HAP with improvement following antibiotic therapy.  HTN runs in his family, father and brother had severe hypertension. Brother had a pulmonary embolism at age 64. Thinks his brother also had "thick blood" polycythemia.    Echo 05/15/21: EF of 40-45% along with mild LVH, moderate LAE, mild MR & moderate/severe TR.  Echo 10/20/22: EF 55-60% with severe LVH, normal PA pressure of 23.1 mmHg  LHC done 05/08/20 showed: Prox RCA lesion is 30% stenosed. Mid RCA lesion is 30% stenosed. Ost LAD lesion is 30% stenosed. Prox LAD lesion is 30% stenosed.  1. Mildly elevated filling pressures.  2. Mild pulmonary venous hypertension.  3. Nonobstructive CAD.   Nonischemic cardiomyopathy.   He presents today for a HF f/u visit with a chief complaint of moderate SOB with very little exertion. Says that he feels like he's more short of breath because his home oxygen ran out last night ~ 9pm. Has fatigue, worsening abdominal distention, cough, worsening pedal edema and weight gain along with this. Has noted some welps on lower abdomen. Denies chest pain, palpitations or dizziness. Says that he slept well last night on 2 pillows except for waking to urinate. Arrived to the office without oxygen and initial sat was 67% with a BP 127/104, HR 117. Placed on 3L of oxygen and sat rose to 82-88%, BP 116/55, HR 87.  Denies any change in his fluid intake although he can't quantify how much fluid he drinks.   Received 80mg  IV lasix/ PO potassium at last visit. Weight today is higher than before IV lasix.   Has had 2 family members die in the last few months, his brother (11/24) and mom (12/24). Reports  increased stress as he's dealing with their estates and spends a lot of time driving back and forth to [facility] and eating more fast food over the last several months.  ROS: All systems negative except as listed in HPI, PMH and Problem List.  SH:  Social History    Socioeconomic History   Marital status: Single    Spouse name: Not on file   Number of children: Not on file   Years of education: Not on file   Highest education level: Not on file  Occupational History   Not on file  Tobacco Use   Smoking status: Former    Current packs/day: 0.00    Average packs/day: 1 pack/day for 27.0 years (27.0 ttl pk-yrs)    Types: Cigarettes    Start date: 04/06/1993    Quit date: 04/06/2020    Years since quitting: 2.9   Smokeless tobacco: Never  Vaping Use   Vaping status: Never Used  Substance and Sexual Activity   Alcohol use: Not Currently   Drug use: Yes    Frequency: 7.0 times per week    Types: Marijuana    Comment: daily   Sexual activity: Yes    Partners: Female    Birth control/protection: None  Other Topics Concern   Not on file  Social History Narrative   Not on file   Social Drivers of Health   Financial Resource Strain: Low Risk  (04/09/2020)   Overall Financial Resource Strain (CARDIA)    Difficulty of Paying Living Expenses: Not very hard  Food Insecurity: No Food Insecurity (04/09/2020)   Hunger Vital Sign    Worried About Running Out of Food in the Last Year: Never true    Ran Out of Food in the Last Year: Never true  Transportation Needs: No Transportation Needs (04/09/2020)   PRAPARE - Administrator, Civil Service (Medical): No    Lack of Transportation (Non-Medical): No  Physical Activity: Inactive (04/09/2020)   Exercise Vital Sign    Days of Exercise per Week: 0 days    Minutes of Exercise per Session: 0 min  Stress: Not on file  Social Connections: Not on file  Intimate Partner Violence: Not on file    FH:  Family History  Problem Relation Age of Onset   Hypertension Father    Hypertension Brother    Pulmonary embolism Brother     Past Medical History:  Diagnosis Date   Atrial flutter (HCC)    Chronic combined systolic and diastolic CHF (congestive heart failure) (HCC)    Diabetes mellitus  without complication (HCC)    Hyperlipidemia    Hypertension    Obesity    Sleep apnea, obstructive     Current Outpatient Medications  Medication Sig Dispense Refill   atorvastatin (LIPITOR) 40 MG tablet Take 2 tablets (80 mg total) by mouth at bedtime. 180 tablet 1   carvedilol (COREG) 6.25 MG tablet Take 1 tablet (6.25 mg total) by mouth 2 (two) times daily with a meal. 180 tablet 3   empagliflozin (JARDIANCE) 10 MG TABS tablet Take 1 tablet (10 mg total) by mouth daily before breakfast. 30 tablet 3   empagliflozin (JARDIANCE) 10 MG TABS tablet Take 1 tablet (10 mg total) by mouth daily before breakfast. 90 tablet 3   OXYGEN Inhale 4 L into the lungs at bedtime.     rivaroxaban (XARELTO) 20 MG TABS tablet Take 1 tablet (20 mg total) by mouth daily with supper. 90 tablet 0  sacubitril-valsartan (ENTRESTO) 49-51 MG Take 1 tablet by mouth 2 (two) times daily. 60 tablet 11   spironolactone (ALDACTONE) 25 MG tablet Take 1 tablet (25 mg total) by mouth once daily. 90 tablet 3   torsemide (DEMADEX) 20 MG tablet Take 2 tablets (40 mg total) by mouth daily. 60 tablet 11   No current facility-administered medications for this visit.   Vitals:   03/20/23 0952 03/20/23 0953 03/20/23 0954 03/20/23 1009  BP: (!) 127/104  (!) 116/55   Pulse: (!) 112 (!) 117 87   SpO2: (!) 80% (!) 67% (!) 82% (!) 88%  Weight: (!) 343 lb (155.6 kg)      Wt Readings from Last 3 Encounters:  03/20/23 (!) 343 lb (155.6 kg)  03/13/23 (!) 336 lb 3.2 oz (152.5 kg)  03/09/23 (!) 337 lb (152.9 kg)   Lab Results  Component Value Date   CREATININE 1.69 (H) 03/09/2023   CREATININE 1.45 (H) 03/07/2023   CREATININE 1.56 (H) 10/26/2022    PHYSICAL EXAM:  General: Well appearing. Visible short of breath when talking for long periods although able to speak in complete sentences HEENT: normal Neck: supple, unable to see JVD due to neck size Cor: Regular rhythm, rate. No rubs, gallops or murmurs Lungs: clear although  diminished in bases Abdomin: soft, nontender, distended up to ribs. Extremities: no cyanosis, clubbing, rash, 3++ edema now up into bilateral thighs Neuro: alert & oriented X 3. Moves all 4 extremities w/o difficulty. Affect pleasant   ECG: not done   ASSESSMENT & PLAN:  1: Acute on chronic NICM with preserved ejection fraction- - suspect due to HTN / currently untreated OSA as cath showed nonobstructive CAD - NYHA class IV - severely fluid up after failing outpatient diureses - weight up 7 pounds from last visit here 10 days ago; up 40 pounds from 4 months ago - received 80mg  IV lasix/ potassium 10 days ago and symptoms are worse today - discussed the need for patient to go to the ED due to worsening symptoms and now no oxygen capability at home - echo 05/15/21: EF of 40-45% along with mild LVH, moderate LAE, mild MR & moderate/severe TR.  - echo 10/20/22: EF 55-60% with severe LVH, normal PA pressure of 23.1 mmHg - BNP 03/09/23 was 1857.9  2: HTN- - BP initially high at 127/104; improved to 116/55 after recheck - saw PCP (Ostwalt) 10/24 - BMP 03/09/23 reviewed and showed sodium 139, potassium 3.7, creatinine 1.69 and GFR 49  3: Obstructive sleep apnea- - currently not wearing his CPAP as it stopped working properly ~ 6 months ago - did have oxygen at home but says that he ran out last night and currently does not have an order to get more - initial room air sat was 67%, placed on 3L in the office and ir rose to 82-88% but then start to fall when he closes his eyes - saw pulmonology Wynona Neat) 01/25 and is scheduled for in-lab split-night sleep study  After lengthy discussion with patient regarding the need to go to the ED as he's failed outpatient diuresis with furoscix and then IV lasix, he is agreeable to going. RN called ED charge nurse to report patient's symptoms and weight gain and then he was transported to the ED with oxygen on 3L by CMA.   Follow-up here after hospital  discharge.

## 2023-03-20 NOTE — ED Notes (Signed)
Pt up to urinal @ bs O2 vis  d/t pt declining BiPap @ bs O2 dislodged pt provided education on health maintenance and O2 pt verbalizes understanding and is redirected to stretcher

## 2023-03-20 NOTE — Progress Notes (Signed)
Spoke with Animator in the ED to inform her that we are bringing this patient to them, who is 67% on RA and only comes to 83% on 5 litres. He is extremely overloaded and needs to be brought back ASAP.

## 2023-03-20 NOTE — H&P (Signed)
History and Physical    Patient: Gregory Mckenzie ZOX:[id number] DOB: [birth year] DOA: 03/20/2023 DOS: the patient was seen and examined on 03/20/2023 PCP: Pcp, No  Patient coming from: Home  Chief Complaint:  Chief Complaint  Patient presents with   Shortness of Breath   HPI: Gregory Mckenzie is a 48 y.o. male with medical history significant of DM, hyperlipidemia, sleep apnea (CPAP broken), HTN, paroxysmal atrial flutter, DM2, obesity, polycythemia, possible obesity hypoventilation syndrome, previous tobacco use and chronic HFpEF presenting with acute respiratory failure with hypoxia, acute on chronic HFpEF, NSTEMI, subclinical COPD, acute on chronic kidney disease.  Patient reports increased work of breathing over multiple days.  Positive orthopnea PND.  Mild wheezing.  Followed by Dr. Okey Dupre outpatient.  Last seen October 2023.  Was evaluated at family medicine clinic today with concern for increased work of breathing and acute CHF exacerbation.  Patient reports compliance with diuretic regimen.  Is ambiguous about salt intake.  No reported NSAID use.  Unclear about compliance.  Noted baseline obstructive sleep apnea.  No longer wearing CPAP as it was reportedly not been working for 6 months.  Patient was redirected to the ER for further evaluation. Presented to the ER afebrile, hemodynamically stable.  Satting in in the mid 80s on 4 L, transition to BiPAP to keep O2 sats greater than 93%.  White count 4.2, hemoglobin 18.9, platelets 175, decompensated respiratory acidosis on blood gas, COVID flu and RSV negative, creatinine 2.2, GFR 140s.  BNP 1600.  Troponin 122.    Review of Systems: As mentioned in the history of present illness. All other systems reviewed and are negative. Past Medical History:  Diagnosis Date   Atrial flutter (HCC)    Chronic combined systolic and diastolic CHF (congestive heart failure) (HCC)    Diabetes mellitus without complication (HCC)    Hyperlipidemia     Hypertension    Obesity    Sleep apnea, obstructive    Past Surgical History:  Procedure Laterality Date   RIGHT/LEFT HEART CATH AND CORONARY ANGIOGRAPHY N/A 05/08/2020   Procedure: RIGHT/LEFT HEART CATH AND CORONARY ANGIOGRAPHY;  Surgeon: Laurey Morale, MD;  Location: [facility] INVASIVE CV LAB;  Service: Cardiovascular;  Laterality: N/A;   Social History:  reports that he quit smoking about 2 years ago. His smoking use included cigarettes. He started smoking about 29 years ago. He has a 27 pack-year smoking history. He has never used smokeless tobacco. He reports that he does not currently use alcohol. He reports current drug use. Frequency: 7.00 times per week. Drug: Marijuana.  No Known Allergies  Family History  Problem Relation Age of Onset   Hypertension Father    Hypertension Brother    Pulmonary embolism Brother     Prior to Admission medications   Medication Sig Start Date End Date Taking? Authorizing Provider  atorvastatin (LIPITOR) 40 MG tablet Take 2 tablets (80 mg total) by mouth at bedtime. 03/16/23   Delma Freeze, FNP  carvedilol (COREG) 6.25 MG tablet Take 1 tablet (6.25 mg total) by mouth 2 (two) times daily with a meal. 09/20/22   Delma Freeze, FNP  empagliflozin (JARDIANCE) 10 MG TABS tablet Take 1 tablet (10 mg total) by mouth daily before breakfast. 09/21/22   Delma Freeze, FNP  empagliflozin (JARDIANCE) 10 MG TABS tablet Take 1 tablet (10 mg total) by mouth daily before breakfast. 03/07/23   Clarisa Kindred A, FNP  OXYGEN Inhale 4 L into the lungs at bedtime.  [provider]  rivaroxaban (XARELTO) 20 MG TABS tablet Take 1 tablet (20 mg total) by mouth daily with supper. 09/20/22   Delma Freeze, FNP  sacubitril-valsartan (ENTRESTO) 49-51 MG Take 1 tablet by mouth 2 (two) times daily. 03/07/23   Delma Freeze, FNP  spironolactone (ALDACTONE) 25 MG tablet Take 1 tablet (25 mg total) by mouth once daily. 09/20/22   Delma Freeze, FNP  torsemide (DEMADEX) 20  MG tablet Take 2 tablets (40 mg total) by mouth daily. 03/07/23   Delma Freeze, FNP    Physical Exam: Vitals:   03/20/23 1041 03/20/23 1042 03/20/23 1205  BP:  (!) 153/130 98/88  Pulse:  86 90  Resp:  20 18  Temp:  98.8 F (37.1 C)   SpO2: (!) 84% (!) 84% 92%   Physical Exam Constitutional:      Appearance: He is obese.  HENT:     Head: Normocephalic and atraumatic.     Nose: Nose normal.     Mouth/Throat:     Mouth: Mucous membranes are moist.  Eyes:     Pupils: Pupils are equal, round, and reactive to light.  Cardiovascular:     Rate and Rhythm: Normal rate and regular rhythm.  Pulmonary:     Effort: Pulmonary effort is normal.     Breath sounds: Wheezing present.  Abdominal:     General: Bowel sounds are normal.  Musculoskeletal:     Right lower leg: Edema present.     Left lower leg: Edema present.  Skin:    General: Skin is warm.  Neurological:     General: No focal deficit present.  Psychiatric:        Mood and Affect: Mood normal.     Data Reviewed:  There are no new results to review at this time.  DG Chest 2 View CLINICAL DATA:  Low O2 sats, weight gain.  EXAM: CHEST - 2 VIEW  COMPARISON:  05/25/2021  FINDINGS: Cardiomegaly, vascular congestion. No overt edema, confluent opacities or effusions. No acute bony abnormality.  IMPRESSION: Cardiomegaly, vascular congestion.  Electronically Signed   By: Charlett Nose M.D.   On: 03/20/2023 11:21  Lab Results  Component Value Date   WBC 4.2 03/20/2023   HGB 18.9 (H) 03/20/2023   HCT 61.2 (H) 03/20/2023   MCV 93.7 03/20/2023   PLT 175 03/20/2023   Last metabolic panel Lab Results  Component Value Date   GLUCOSE 147 (H) 03/20/2023   NA 140 03/20/2023   K 4.8 03/20/2023   CL 96 (L) 03/20/2023   CO2 32 03/20/2023   BUN 44 (H) 03/20/2023   CREATININE 2.19 (H) 03/20/2023   GFRNONAA 36 (L) 03/20/2023   CALCIUM 8.9 03/20/2023   PHOS 4.1 05/26/2021   PROT 6.9 05/21/2021   ALBUMIN 3.2 (L)  05/21/2021   BILITOT 1.3 (H) 05/21/2021   ALKPHOS 91 05/21/2021   AST 20 05/21/2021   ALT 19 05/21/2021   ANIONGAP 12 03/20/2023    Assessment and Plan: Acute respiratory failure with hypoxia (HCC) Acute on chronic HFpEF (2D echo September 2024 with EF of 50 to 55%) Obesity hypoventilation syndrome-broken CPAP Decompensated respiratory failure now requiring BiPAP in the setting of acute on chronic HFpEF, OHS with broken CPAP, likely subclinical COPD Body habitus is a confounding issue BNP 1600 Chest x-ray cardiomegaly and vascular congestion Positive diffuse wheezing on exam Decompensated respiratory acidosis on blood gas today with prior longstanding smoking history IV Lasix P.o. prednisone DuoNebs Azithromycin  Resume CPAP as appropriate Weight today 155.6 kg Strict ins and outs and daily weights Follow-up cardiology recommendations  NSTEMI (non-ST elevated myocardial infarction) (HCC) Trop 120s in setting of decompensated respiratory failure on BiPAP No active chest pain at present EKG stable Suspect secondary demand ischemia in the setting of respiratory failure with active heart failure and?  Subclinical COPD Aspirin Continue Eliquis Suspect stage III CKD may be confounding issue Trend troponin Follow-up cardiology recommendations  Acute kidney injury superimposed on chronic kidney disease (HCC) Creatinine 2.2 today with GFR in the 30s Baseline creatinine 1.6 with GFR in the 50s Suspect element of poor renal perfusion Monitor renal function with diuresis Follow  Paroxysmal atrial flutter (HCC) Rate controlled at present Continue home regimen including Coreg and Xarelto  Type 2 diabetes mellitus with complication, without long-term current use of insulin (HCC) Glucose 140s SSI A1c Monitor blood sugars with steroid use      Advance Care Planning:   Code Status: Full Code   Consults: Cardiology   Family Communication: No family at the bedside    Severity of Illness: The appropriate patient status for this patient is INPATIENT. Inpatient status is judged to be reasonable and necessary in order to provide the required intensity of service to ensure the patient's safety. The patient's presenting symptoms, physical exam findings, and initial radiographic and laboratory data in the context of their chronic comorbidities is felt to place them at high risk for further clinical deterioration. Furthermore, it is not anticipated that the patient will be medically stable for discharge from the hospital within 2 midnights of admission.   * I certify that at the point of admission it is my clinical judgment that the patient will require inpatient hospital care spanning beyond 2 midnights from the point of admission due to high intensity of service, high risk for further deterioration and high frequency of surveillance required.*  Author: Floydene Flock, MD 03/20/2023 2:34 PM  For on call review [email or web address].

## 2023-03-20 NOTE — ED Notes (Signed)
Pt up to urinal pt O2 decrease with exertion pt aox3 pt verbalizes understanding of current POC and declines Bipap

## 2023-03-20 NOTE — Assessment & Plan Note (Signed)
Creatinine 2.2 today with GFR in the 30s Baseline creatinine 1.6 with GFR in the 50s Suspect element of poor renal perfusion Monitor renal function with diuresis Follow

## 2023-03-20 NOTE — Assessment & Plan Note (Addendum)
Trop 120s in setting of decompensated respiratory failure on BiPAP No active chest pain at present EKG stable Suspect secondary demand ischemia in the setting of respiratory failure with active heart failure and?  Subclinical COPD Aspirin Continue xarelto Suspect stage III CKD may be confounding issue Trend troponin Follow-up cardiology recommendations

## 2023-03-20 NOTE — ED Notes (Addendum)
8.0 ETT placed 26 at lip with positive color change by MD Jessup.

## 2023-03-20 NOTE — Consult Note (Cosign Needed)
Cardiology Consult    Patient ID: Alpheus Stiff MRN: [id number], DOB/AGE: 50-Jun-1977   Admit date: 03/20/2023 Date of Consult: 03/20/2023  Primary Physician: Aviva Kluver Primary Cardiologist: Yvonne Kendall, MD Requesting Provider: Dr. Alvester Morin  Patient Profile    Gregory Mckenzie is a 48 y.o. male with a history of syst/diast CHF, hyperlipidemia, sleep apnea (CPAP broken), HTN, paroxysmal atrial flutter, DM2, obesity, polycythemia, obesity hypoventilation syndrome, and previous tobacco use, who is being seen today for the evaluation of a CHF exacerbation at the request of Dr. Alvester Morin.  Past Medical History  Subjective  Past Medical History:  Diagnosis Date   Atrial flutter (HCC)    a. CHA2DS2VASc = 4-->xarelto.   CAD (coronary artery disease)    a. 04/2020 Cath: LM nl, LAD 30ost/p, LCX nl, RCA 30p/m.   Chronic combined systolic and diastolic CHF (congestive heart failure) (HCC)    a. 03/2020 Echo: EF 40-45%; b. 05/2021 Echo: EF 40-45%; c. 10/2022 Echo: EF 55-60%, no rwma, sev LVH, RVSP 23.1 mmHg.   Diabetes mellitus without complication (HCC)    Hyperlipidemia    Hypertension    NICM (nonischemic cardiomyopathy) (HCC)    Nonadherence to medication    Obesity    Sleep apnea, obstructive     Past Surgical History:  Procedure Laterality Date   RIGHT/LEFT HEART CATH AND CORONARY ANGIOGRAPHY N/A 05/08/2020   Procedure: RIGHT/LEFT HEART CATH AND CORONARY ANGIOGRAPHY;  Surgeon: Laurey Morale, MD;  Location: [facility] INVASIVE CV LAB;  Service: Cardiovascular;  Laterality: N/A;     Allergies  No Known Allergies    History of Present Illness  Gregory Mckenzie is a 48 y.o. male with a history of chronic syst/diast CHF, hyperlipidemia, sleep apnea (CPAP broken), HTN, paroxysmal atrial flutter, DM2, obesity, polycythemia, possible obesity hypoventilation syndrome, and previous tobacco,  who is being seen today for the evaluation of a CHF exacerbation .  He was first diagnosed with  HFrEF during admission in 03/2020 that was complicated by paroxysmal atrial flutter. EF was 40-45% at that time.  Subsequent R/LHC showed non-obstructive CAD (relatively diffuse 30% stenoses) and mildly elevated filling pressures. He was lost to follow-up but readmitted in 04/2021 w/ CHF in the setting of noncompliance.  Echo showed persistent mod LV dysfxn @ 40-45%.  He has since f/u periodically in HF clinic.  Echo in 10/2023 showed improved EF.  Over the past six months, he has noted progressively increasing abd girth, DOE, and lower ext edema.  He says that he has been taking medication here and there, often due to financial difficulty.  He's pretty sure that he wasn't taking his medicine regularly between Halloween and Christmas 2024.  He notes better compliance since Christmas 2024.  Through HF clinic, he was provided w/ IV lasix in late January, however, he did not experience much diuresis.  He was seen in HF clinic today with concerns for increased work of breathing and an acute CHF exacerbation. Patient was directed to the ER due to hypoxia w/ O2 sat of 67% on RA. He said that he ran out of his home O2 last night.  Here, he was saturating in the mid 80s on 4 L, and was transitioned to BiPAP.  On reviewing Labs, White count 4.2, hemoglobin 18.9, platelets 175, COVID flu and RSV negative, creatinine 2.2, GFR 140s. BNP 1600. Troponin 122.  He is currently wearing BiPAP on the side of his face and says that he feels fine.  He has yet to  receive any IV diuretics.  CXR w/ vascular congestion.  He was placed on abx out of concern for PNA.  Inpatient Medications  Subjective    atorvastatin  80 mg Oral QHS   [START ON 03/21/2023] azithromycin  500 mg Oral Daily   carvedilol  6.25 mg Oral BID WC   furosemide  80 mg Intravenous TID   insulin aspart  0-15 Units Subcutaneous TID WC   insulin aspart  3 Units Subcutaneous TID WC   ipratropium-albuterol  3 mL Nebulization Q6H   [START ON 03/21/2023] predniSONE  40 mg  Oral Q breakfast   rivaroxaban  20 mg Oral Q supper   sodium chloride flush  3 mL Intravenous Q12H   spironolactone  25 mg Oral Daily    Family History    Family History  Problem Relation Age of Onset   Hypertension Father    Hypertension Brother    Pulmonary embolism Brother    He indicated that the status of his father is unknown. He indicated that the status of his brother is unknown.   Social History    Social History   Socioeconomic History   Marital status: Single    Spouse name: Not on file   Number of children: Not on file   Years of education: Not on file   Highest education level: Not on file  Occupational History   Not on file  Tobacco Use   Smoking status: Former    Current packs/day: 0.00    Average packs/day: 1 pack/day for 27.0 years (27.0 ttl pk-yrs)    Types: Cigarettes    Start date: 04/06/1993    Quit date: 04/06/2020    Years since quitting: 2.9   Smokeless tobacco: Never  Vaping Use   Vaping status: Never Used  Substance and Sexual Activity   Alcohol use: Not Currently   Drug use: Yes    Frequency: 7.0 times per week    Types: Marijuana    Comment: daily   Sexual activity: Yes    Partners: Female    Birth control/protection: None  Other Topics Concern   Not on file  Social History Narrative   Not on file   Social Drivers of Health   Financial Resource Strain: Low Risk  (04/09/2020)   Overall Financial Resource Strain (CARDIA)    Difficulty of Paying Living Expenses: Not very hard  Food Insecurity: No Food Insecurity (04/09/2020)   Hunger Vital Sign    Worried About Running Out of Food in the Last Year: Never true    Ran Out of Food in the Last Year: Never true  Transportation Needs: No Transportation Needs (04/09/2020)   PRAPARE - Administrator, Civil Service (Medical): No    Lack of Transportation (Non-Medical): No  Physical Activity: Inactive (04/09/2020)   Exercise Vital Sign    Days of Exercise per Week: 0 days     Minutes of Exercise per Session: 0 min  Stress: Not on file  Social Connections: Not on file  Intimate Partner Violence: Not on file     Review of Systems    General:  Positive for malaise/fatigue, increased weight changes  Cardiovascular:  No chest pain. Patient is having dyspnea on exertion and at rest req bipap.  Inc lower ext edmea and abd girth.  39 lbs wt gain since Sept 2024. Dermatological: No rash, lesions/masses Respiratory:  Positive for shortness of breath. Negative for cough, sputum production and wheezing.   Urologic: No hematuria, dysuria  Abdominal: +++ inc abd girth.  Negative for abdominal pain, heartburn, nausea and vomiting.   Neurologic:  No visual changes, wkns, changes in mental status. All other systems reviewed and are otherwise negative except as noted above.    Objective  Physical Exam    Blood pressure 98/88, pulse 90, temperature 98.8 F (37.1 C), resp. rate 18, SpO2 92%.  General: Obsese, no acute distress. Psych: Normal affect. Neuro: Alert and oriented X 3. Moves all extremities spontaneously. HEENT: Normal  Neck: JVD difficult to assess secondary to body habitus and respiratory support apparatus. Lungs:  Diminished breath sounds bilaterally  Heart: RRR, distant, no s3, s4, or murmurs. Abdomen: Distended, edematous - 2+ pitting to mid back/across abd.  Extremities: 2-3+ bilat LE woody edema to hips.  Labs    Cardiac Enzymes Recent Labs  Lab 03/20/23 1046 03/20/23 1513  TROPONINIHS 122* 152*     BNP    Component Value Date/Time   BNP 1,596.6 (H) 03/20/2023 1046    Lab Results  Component Value Date   WBC 4.2 03/20/2023   HGB 18.9 (H) 03/20/2023   HCT 61.2 (H) 03/20/2023   MCV 93.7 03/20/2023   PLT 175 03/20/2023    Recent Labs  Lab 03/20/23 1046  NA 140  K 4.8  CL 96*  CO2 32  BUN 44*  CREATININE 2.19*  CALCIUM 8.9  GLUCOSE 147*   Lab Results  Component Value Date   CHOL 131 10/26/2022   HDL 37 (L) 10/26/2022    LDLCALC 64 10/26/2022   TRIG 176 (H) 10/26/2022     Radiology Studies    DG Chest 2 View Result Date: 03/20/2023 CLINICAL DATA:  Low O2 sats, weight gain. EXAM: CHEST - 2 VIEW COMPARISON:  05/25/2021 FINDINGS: Cardiomegaly, vascular congestion. No overt edema, confluent opacities or effusions. No acute bony abnormality. IMPRESSION: Cardiomegaly, vascular congestion. Electronically Signed   By: Charlett Nose M.D.   On: 03/20/2023 11:21      ECG & Cardiac Imaging    RSR, 85, 1st deg avb, PVC, RAD, BAE, antlat infarct - not prev noted - personally reviewed.  Assessment & Plan    1.  Acute on chronic HFpEF  Patient w/ h/o syst/diast CHF w/ nl EF on most recent echo in 10/2022.  Over the past 5 mos, has gained 39 lbs w/ worsening dyspnea, edema, inc abd girth, and O2 requirements in the setting of frequent noncompliance w/ medicines and diet high on processed foods.  Despite efforts in HF clinic to keep him out of the hosp (received IV lasix last week), he had worsening symptoms and hypoxia, prompting referral to the ED.  He is massively volume overloaded on exam w/ chf on cxr.  D/c home dose of torsemide.  I've added lasix 80 IV TID.  Follow renal fxn.  May need metolazone.  Cont ? blocker and spiro.  If renal fxn stable w/ diuresis, would look to add sglt2i if not cost prohibitive.  As outpt will greatly benefit from GLP1 agonist as outpt.   2.  HTN:  BP initially elevated.  Follow w/ diuresis.  Cont ? blocker and spiro.  3.  HL:  LDL 64 in 10/2022.  Cont statin.  4.  CAD/Demand Ischemia:  Nonobs CAD on cath in 2022.  No c/p, however, hsTrop elev w/ flat trend of 122  152.  Suspect demand ischemia in setting of volume overload.  Cont ? blocker and statin.  No asa in setting of chronic xarelto.  5.  DMII:  A1c 7.0 in August.  Per IM.  Consider sglt2i and glp1a.  6.  CKD III:  Creat elevated above prior baseline in the setting of massive volume overload.  Follow w/ diuresis.  7.  PAFlutter: in  sinus.  Cont ? blocker and xarelto - follow renal fxn for possible dose adjustment.  Risk Assessment/Risk Scores:     TIMI Risk Score for Unstable Angina or Non-ST Elevation MI:   The patient's TIMI risk score is  , which indicates a  % risk of all cause mortality, new or recurrent myocardial infarction or need for urgent revascularization in the next 14 days.  New York Heart Association (NYHA) Functional Class NYHA Class IV  CHA2DS2-VASc Score = 4   This indicates a 4.8% annual risk of stroke. The patient's score is based upon: CHF History: 1 HTN History: 1 Diabetes History: 1 Stroke History: 0 Vascular Disease History: 1 Age Score: 0 Gender Score: 0     Signed, Nicolasa Ducking, NP 03/20/2023, 5:19 PM  For questions or updates, please contact   Please consult [email or web address] for contact info under Cardiology/STEMI.

## 2023-03-20 NOTE — ED Notes (Signed)
Pt up to urinal noted O2 t decrease with exertion education provided pt discusses bipap pt agrees with Bipap appliance Resp notified

## 2023-03-20 NOTE — Progress Notes (Signed)
Pt transported to ICU 4 on the vent without incident. Pt remains on the vent and is tol well at this time. Report given to ICU RT

## 2023-03-20 NOTE — Assessment & Plan Note (Signed)
Rate controlled at present Continue home regimen including Coreg and Xarelto

## 2023-03-20 NOTE — Telephone Encounter (Signed)
Kindly reach out to patient to see what he needs regarding his oxygen  Cardiology had messaged me about patient running out of oxygen  He was seen recently in the office, scheduled for a sleep study

## 2023-03-20 NOTE — ED Notes (Signed)
 Patient denies pain and is resting comfortably.

## 2023-03-20 NOTE — Assessment & Plan Note (Addendum)
Acute on chronic HFpEF (2D echo September 2024 with EF of 50 to 55%) Obesity hypoventilation syndrome-broken CPAP Decompensated respiratory failure now requiring BiPAP in the setting of acute on chronic HFpEF, OHS with broken CPAP, likely subclinical COPD Body habitus is a confounding issue BNP 1600 Chest x-ray cardiomegaly and vascular congestion Positive diffuse wheezing on exam Decompensated respiratory acidosis on blood gas today with prior longstanding smoking history IV Lasix P.o. prednisone DuoNebs Azithromycin Resume CPAP as appropriate Weight today 155.6 kg Strict ins and outs and daily weights Follow-up cardiology recommendations

## 2023-03-20 NOTE — ED Notes (Signed)
MD Larinda Buttery at the bedside with attending for RSI

## 2023-03-20 NOTE — ED Notes (Signed)
Report provided to ICU resp @ bs for transport Gerilyn Pilgrim RN assists in transport to ICU pt deep suction provided by Gerilyn Pilgrim @ bs for O2 89% pulse ox pleth wave form regular

## 2023-03-20 NOTE — ED Notes (Signed)
Pt continues to decrease in o2 and decompensate resp notified NP [name] notified writer RN @ bs notifies ED phsyician charge notified

## 2023-03-20 NOTE — ED Notes (Signed)
Biapap applied pt appears to rest O2 noted to decrease Presenter, broadcasting contacts resp to attempt to adjust setting on bipap for appropriate oxygenation ventilation assistance

## 2023-03-20 NOTE — ED Triage Notes (Signed)
Patient presents via POV with c/o shortness of breath. Patient reports "I have fluid on me and I'm trying to get it off". Reports history of heart failure. On 4L chronically. Patient also reports increased swelling in legs

## 2023-03-20 NOTE — ED Notes (Signed)
O2 increase from 89% to 93 %

## 2023-03-20 NOTE — ED Notes (Signed)
Pt from the heart failure clinic for evaluation. Pts sats were 67% on RA and 82% on 3 liters. Pt has home O2 but he has been out. Pt has had a 7 pound weight gain in the last week. Pt was given IV Lasix in the heart failure clinic last week but it has not helped. Pt is speaking in complete sentences at this time.

## 2023-03-20 NOTE — ED Provider Notes (Signed)
[facility] Provider Note    Event Date/Time   First MD Initiated Contact with Patient 03/20/23 1056     (approximate)   History   Shortness of Breath   HPI  Gregory Mckenzie is a 48 y.o. male past medical tree significant for CHF with an EF of 40%, OSA, obesity, hypertension, paroxysmal atrial flutter previously on Xarelto, who presents to the emergency department with shortness of breath.  States that he has had progressively worsening shortness of breath and feeling like his legs and his abdomen is retaining fluid.  Seen at heart failure clinic today and was sent to the emergency department for reevaluation.  Patient states that his oxygen is stopped working and he does not know how to clean his oxygen.  Also states that his CPAP is broken.  When the patient arrived to clinic today found to be hypoxic in the 60s.  Tachycardic and was ill-appearing.  Sent to the emergency department given his hypoxia.  Patient continued to be 84% on 4 L nasal cannula.  States that he had an increase in his Lasix last week from 20 to 40 mg.     Physical Exam   Triage Vital Signs: ED Triage Vitals  Encounter Vitals Group     BP 03/20/23 1042 (!) 153/130     Systolic BP Percentile --      Diastolic BP Percentile --      Pulse Rate 03/20/23 1042 86     Resp 03/20/23 1042 20     Temp 03/20/23 1042 98.8 F (37.1 C)     Temp src --      SpO2 03/20/23 1041 (!) 84 %     Weight --      Height --      Head Circumference --      Peak Flow --      Pain Score 03/20/23 1043 0     Pain Loc --      Pain Education --      Exclude from Growth Chart --     Most recent vital signs: Vitals:   03/20/23 1042 03/20/23 1205  BP: (!) 153/130 98/88  Pulse: 86 90  Resp: 20 18  Temp: 98.8 F (37.1 C)   SpO2: (!) 84% 92%    Physical Exam Constitutional:      Appearance: He is well-developed. He is obese.  HENT:     Head: Atraumatic.  Eyes:     Conjunctiva/sclera:  Conjunctivae normal.  Neck:     Vascular: JVD present.  Pulmonary:     Effort: Respiratory distress present.     Comments: Hypoxic, on 4 L nasal cannula, speaking in 2 word sentences.  Distant breath sounds. Musculoskeletal:     Cervical back: Normal range of motion.     Right lower leg: Edema present.     Left lower leg: Edema present.     Comments: Significant edema to bilateral lower extremities.  Abdomen is distended.  Skin:    General: Skin is warm.     Capillary Refill: Capillary refill takes less than 2 seconds.  Neurological:     General: No focal deficit present.     Mental Status: He is alert. Mental status is at baseline.  Psychiatric:        Mood and Affect: Mood normal.     IMPRESSION / MDM / ASSESSMENT AND PLAN / ED COURSE  I reviewed the triage vital signs and the nursing notes.  Differential diagnosis including viral illness including COVID/influenza, ACS, heart failure exacerbation, pneumonia, pulmonary embolism, pulmonary hypertension, obesity hypoventilation syndrome  EKG  I, Corena Herter, the attending physician, personally viewed and interpreted this ECG.  EKG with first-degree heart block with a PR interval of 218.  2 PVCs.  Nonspecific ST changes.  No significant ST elevation or depression.  No signs of acute ischemia or dysrhythmia.  No significant change compared to prior EKG.  No tachycardic or bradycardic dysrhythmias while on cardiac telemetry.  RADIOLOGY I independently reviewed imaging, my interpretation of imaging: Cardiomegaly, appears to have findings of pulmonary edema.  Read as cardiomegaly with vascular congestion.  No focal findings consistent with pneumonia.  LABS (all labs ordered are listed, but only abnormal results are displayed) Labs interpreted as -    Labs Reviewed  BASIC METABOLIC PANEL - Abnormal; Notable for the following components:      Result Value   Chloride 96 (*)    Glucose, Bld 147 (*)    BUN 44 (*)     Creatinine, Ser 2.19 (*)    GFR, Estimated 36 (*)    All other components within normal limits  CBC - Abnormal; Notable for the following components:   RBC 6.53 (*)    Hemoglobin 18.9 (*)    HCT 61.2 (*)    RDW 21.6 (*)    nRBC 2.4 (*)    All other components within normal limits  PROTIME-INR - Abnormal; Notable for the following components:   Prothrombin Time 23.3 (*)    INR 2.0 (*)    All other components within normal limits  BRAIN NATRIURETIC PEPTIDE - Abnormal; Notable for the following components:   B Natriuretic Peptide 1,596.6 (*)    All other components within normal limits  BLOOD GAS, VENOUS - Abnormal; Notable for the following components:   pH, Ven 7.19 (*)    pCO2, Ven 102 (*)    Bicarbonate 39.0 (*)    Acid-Base Excess 6.9 (*)    All other components within normal limits  TROPONIN I (HIGH SENSITIVITY) - Abnormal; Notable for the following components:   Troponin I (High Sensitivity) 122 (*)    All other components within normal limits  RESP PANEL BY RT-PCR (RSV, FLU A&B, COVID)  [id number]     MDM  On chart review patient was seen today in heart failure clinic and found to be hypoxic in the 60s.  Placed on 4 L nasal cannula was sent to the emergency department.  Patient continues to be hypoxic, does wear oxygen at home but it has been broken.  Afebrile.  Initially blood pressure was elevated in the emergency department however on repeat did have a significant drop of his blood pressure.  Did have increase of his Lasix dose recently.  BNP significantly elevated at 1500.  No significant leukocytosis.  Does have elevated hemoglobin which is consistent with his polycythemia likely in the setting of obesity hypoventilation syndrome.  Significant hypercarbia, placed on BiPAP with improvement of his work of breathing.  100% on BiPAP.  Given his soft blood pressures and history of hypertension we will hold on any further IV Lasix at this time.  Elevated troponin in the 100s which  is likely in the setting of demand ischemia from heart failure exacerbation.  Consulted hospitalist for acute on chronic hypoxic hypercarbic respiratory failure with acute on chronic systolic heart failure exacerbation.     PROCEDURES:  Critical Care performed: yes  .Critical Care  Performed by: Arnoldo Morale,  Carollee Herter, MD Authorized by: Corena Herter, MD   Critical care provider statement:    Critical care time (minutes):  35   Critical care time was exclusive of:  Separately billable procedures and treating other patients   Critical care was necessary to treat or prevent imminent or life-threatening deterioration of the following conditions:  Respiratory failure   Critical care was time spent personally by me on the following activities:  Development of treatment plan with patient or surrogate, discussions with consultants, evaluation of patient's response to treatment, examination of patient, ordering and review of laboratory studies, ordering and review of radiographic studies, ordering and performing treatments and interventions, pulse oximetry, re-evaluation of patient's condition and review of old charts   Care discussed with: admitting provider     Patient's presentation is most consistent with acute presentation with potential threat to life or bodily function.   MEDICATIONS ORDERED IN ED: Medications - No data to display  FINAL CLINICAL IMPRESSION(S) / ED DIAGNOSES   Final diagnoses:  Hypoxia  Acute on chronic combined systolic and diastolic CHF (congestive heart failure) (HCC)     Rx / DC Orders   ED Discharge Orders     None        Note:  This document was prepared using Dragon voice recognition software and may include unintentional dictation errors.   Corena Herter, MD 03/20/23 1340

## 2023-03-20 NOTE — Progress Notes (Signed)
PT Cancellation Note  Patient Details Name: Gregory Mckenzie MRN: [id number] DOB: 10/16/1975   Cancelled Treatment:    Reason Eval/Treat Not Completed: Other (comment). Pt currently on bipap. Will hold off on exertional activity at this time.   Lior Hoen 03/20/2023, 2:55 PM Elizabeth Palau, PT, DPT, GCS [phone]

## 2023-03-20 NOTE — ED Notes (Addendum)
Per MD [place] 20mg  Etomidate and 100mg  Rocuronium for induction pulled by this RN

## 2023-03-20 NOTE — ED Notes (Signed)
Pt up to ruinal dislodges O2 via [place] pt declines Bipap pt educated and provided emotional support pt provided telephone with mother # contacted

## 2023-03-20 NOTE — ED Notes (Signed)
Writer RN notifies NP [name] pt requires bs evaluation possibly sedative O2 decreasing pt appears to be confused with following direction

## 2023-03-20 NOTE — ED Provider Notes (Signed)
-----------------------------------------   10:53 PM on 03/20/2023 ----------------------------------------- I was notified by nursing staff that patient now obtunded after refusing BiPAP for much of the day for hypercapnic respiratory failure.  Latest blood gas from 12 hours ago with significant respiratory acidosis, likely worse after patient refused BiPAP.  Decision made to proceed with intubation, patient also with borderline blood pressures and was started on IV Levophed, also given dose of bicarb to help with worsening acidosis around intubation.  Elizabeth from the ICU was at bedside, will assume care following intubation.   Procedure Name: Intubation Date/Time: 03/20/2023 10:53 PM  Performed by: Chesley Noon, MDPre-anesthesia Checklist: Patient identified, Patient being monitored, Emergency Drugs available, Timeout performed and Suction available Oxygen Delivery Method: Non-rebreather mask Preoxygenation: Pre-oxygenation with 100% oxygen Induction Type: Rapid sequence Ventilation: Mask ventilation without difficulty Laryngoscope Size: Glidescope and 4 Grade View: Grade I Tube size: 8.0 mm Number of attempts: 1 Airway Equipment and Method: Video-laryngoscopy Placement Confirmation: ETT inserted through vocal cords under direct vision, CO2 detector and Breath sounds checked- equal and bilateral Secured at: 25 cm Tube secured with: Tape Dental Injury: Teeth and Oropharynx as per pre-operative assessment  Difficulty Due To: Difficult Airway- due to large Keturah Barre, MD 03/20/23 2256

## 2023-03-20 NOTE — Assessment & Plan Note (Signed)
BMI 55+ today  Discussed diet and lifestyle modification

## 2023-03-21 ENCOUNTER — Inpatient Hospital Stay: Payer: Medicaid Other

## 2023-03-21 ENCOUNTER — Other Ambulatory Visit: Payer: Self-pay

## 2023-03-21 ENCOUNTER — Inpatient Hospital Stay
Admit: 2023-03-21 | Discharge: 2023-03-21 | Disposition: A | Discharge status: Home / Self Care | Payer: Medicaid Other | Attending: Nurse Practitioner | Admitting: Nurse Practitioner

## 2023-03-21 DIAGNOSIS — I5033 Acute on chronic diastolic (congestive) heart failure: Secondary | ICD-10-CM | POA: Diagnosis not present

## 2023-03-21 DIAGNOSIS — I10 Essential (primary) hypertension: Secondary | ICD-10-CM | POA: Diagnosis not present

## 2023-03-21 DIAGNOSIS — G9341 Metabolic encephalopathy: Secondary | ICD-10-CM

## 2023-03-21 DIAGNOSIS — J9622 Acute and chronic respiratory failure with hypercapnia: Secondary | ICD-10-CM

## 2023-03-21 DIAGNOSIS — J811 Chronic pulmonary edema: Secondary | ICD-10-CM

## 2023-03-21 DIAGNOSIS — E119 Type 2 diabetes mellitus without complications: Secondary | ICD-10-CM

## 2023-03-21 LAB — ECHOCARDIOGRAM COMPLETE
AR max vel: 3.14 cm2
AV Area VTI: 2.82 cm2
AV Area mean vel: 2.94 cm2
AV Mean grad: 3 mm[Hg]
AV Peak grad: 6.8 mm[Hg]
Ao pk vel: 1.3 m/s
Area-P 1/2: 2.53 cm2
MV VTI: 2.79 cm2
S' Lateral: 3.3 cm
Weight: 5488.57 [oz_av]

## 2023-03-21 LAB — BLOOD GAS, VENOUS
Acid-Base Excess: 9.9 mmol/L — ABNORMAL HIGH (ref 0.0–2.0)
Bicarbonate: 34.3 mmol/L — ABNORMAL HIGH (ref 20.0–28.0)
FIO2: 100 %
MECHVT: 550 mL
Mechanical Rate: 24
O2 Saturation: 85.1 %
PEEP: 8 cmH2O
Patient temperature: 37
pCO2, Ven: 44 mm[Hg] (ref 44–60)
pH, Ven: 7.5 — ABNORMAL HIGH (ref 7.25–7.43)
pO2, Ven: 47 mm[Hg] — ABNORMAL HIGH (ref 32–45)

## 2023-03-21 LAB — COMPREHENSIVE METABOLIC PANEL
ALT: 30 U/L (ref 0–44)
AST: 20 U/L (ref 15–41)
Albumin: 3.3 g/dL — ABNORMAL LOW (ref 3.5–5.0)
Alkaline Phosphatase: 165 U/L — ABNORMAL HIGH (ref 38–126)
Anion gap: 18 — ABNORMAL HIGH (ref 5–15)
BUN: 42 mg/dL — ABNORMAL HIGH (ref 6–20)
CO2: 26 mmol/L (ref 22–32)
Calcium: 8.9 mg/dL (ref 8.9–10.3)
Chloride: 98 mmol/L (ref 98–111)
Creatinine, Ser: 1.73 mg/dL — ABNORMAL HIGH (ref 0.61–1.24)
GFR, Estimated: 48 mL/min — ABNORMAL LOW (ref >60)
Glucose, Bld: 125 mg/dL — ABNORMAL HIGH (ref 70–99)
Potassium: 4.2 mmol/L (ref 3.5–5.1)
Sodium: 142 mmol/L (ref 135–145)
Total Bilirubin: 2.5 mg/dL — ABNORMAL HIGH (ref 0.0–1.2)
Total Protein: 6.6 g/dL (ref 6.5–8.1)

## 2023-03-21 LAB — BASIC METABOLIC PANEL
Anion gap: 15 (ref 5–15)
BUN: 37 mg/dL — ABNORMAL HIGH (ref 6–20)
CO2: 30 mmol/L (ref 22–32)
Calcium: 8.8 mg/dL — ABNORMAL LOW (ref 8.9–10.3)
Chloride: 97 mmol/L — ABNORMAL LOW (ref 98–111)
Creatinine, Ser: 1.66 mg/dL — ABNORMAL HIGH (ref 0.61–1.24)
GFR, Estimated: 50 mL/min — ABNORMAL LOW (ref >60)
Glucose, Bld: 130 mg/dL — ABNORMAL HIGH (ref 70–99)
Potassium: 4.4 mmol/L (ref 3.5–5.1)
Sodium: 142 mmol/L (ref 135–145)

## 2023-03-21 LAB — CBC
HCT: 59.9 % — ABNORMAL HIGH (ref 39.0–52.0)
Hemoglobin: 19.1 g/dL — ABNORMAL HIGH (ref 13.0–17.0)
MCH: 29.2 pg (ref 26.0–34.0)
MCHC: 31.9 g/dL (ref 30.0–36.0)
MCV: 91.7 fL (ref 80.0–100.0)
Platelets: 193 10*3/uL (ref 150–400)
RBC: 6.53 MIL/uL — ABNORMAL HIGH (ref 4.22–5.81)
RDW: 21.5 % — ABNORMAL HIGH (ref 11.5–15.5)
WBC: 3.9 10*3/uL — ABNORMAL LOW (ref 4.0–10.5)
nRBC: 4.4 % — ABNORMAL HIGH (ref 0.0–0.2)

## 2023-03-21 LAB — LACTIC ACID, PLASMA
Lactic Acid, Venous: 1.4 mmol/L (ref 0.5–1.9)
Lactic Acid, Venous: 1.4 mmol/L (ref 0.5–1.9)

## 2023-03-21 LAB — GLUCOSE, CAPILLARY
Glucose-Capillary: 111 mg/dL — ABNORMAL HIGH (ref 70–99)
Glucose-Capillary: 115 mg/dL — ABNORMAL HIGH (ref 70–99)
Glucose-Capillary: 123 mg/dL — ABNORMAL HIGH (ref 70–99)
Glucose-Capillary: 126 mg/dL — ABNORMAL HIGH (ref 70–99)
Glucose-Capillary: 144 mg/dL — ABNORMAL HIGH (ref 70–99)
Glucose-Capillary: 151 mg/dL — ABNORMAL HIGH (ref 70–99)
Glucose-Capillary: 67 mg/dL — ABNORMAL LOW (ref 70–99)

## 2023-03-21 LAB — HEPARIN LEVEL (UNFRACTIONATED): Heparin Unfractionated: >1.1 [IU]/mL — ABNORMAL HIGH (ref 0.30–0.70)

## 2023-03-21 LAB — APTT
aPTT: 36 s (ref 24–36)
aPTT: 52 s — ABNORMAL HIGH (ref 24–36)

## 2023-03-21 LAB — MAGNESIUM: Magnesium: 2.2 mg/dL (ref 1.7–2.4)

## 2023-03-21 LAB — MRSA NEXT GEN BY PCR, NASAL: MRSA by PCR Next Gen: NOT DETECTED

## 2023-03-21 MED ORDER — ORAL CARE MOUTH RINSE
15.0000 mL | OROMUCOSAL | Status: DC
  Administered 2023-03-21 – 2023-03-29 (×97): 15 mL via OROMUCOSAL

## 2023-03-21 MED ORDER — PROPOFOL 1000 MG/100ML IV EMUL
INTRAVENOUS | Status: AC
  Administered 2023-03-21: 10 ug via INTRAVENOUS
  Filled 2023-03-21: qty 100

## 2023-03-21 MED ORDER — POTASSIUM CHLORIDE CRYS ER 20 MEQ PO TBCR: 40.0000 meq | EXTENDED_RELEASE_TABLET | Freq: Once | ORAL | Status: DC

## 2023-03-21 MED ORDER — ORAL CARE MOUTH RINSE: 15.0000 mL | OROMUCOSAL | Status: DC | PRN

## 2023-03-21 MED ORDER — NOREPINEPHRINE 4 MG/250ML-% IV SOLN
0.0000 ug/min | INTRAVENOUS | Status: DC
  Administered 2023-03-21 – 2023-03-23 (×2): 2 ug/min via INTRAVENOUS
  Administered 2023-03-23: 1 ug/min via INTRAVENOUS
  Filled 2023-03-21 (×2): qty 250

## 2023-03-21 MED ORDER — POTASSIUM CHLORIDE 10 MEQ/100ML IV SOLN
10.0000 meq | INTRAVENOUS | Status: AC
  Administered 2023-03-21 (×2): 10 meq via INTRAVENOUS
  Filled 2023-03-21 (×2): qty 100

## 2023-03-21 MED ORDER — FUROSEMIDE 10 MG/ML IJ SOLN
40.0000 mg | Freq: Two times a day (BID) | INTRAMUSCULAR | Status: DC
  Administered 2023-03-21: 40 mg via INTRAVENOUS
  Filled 2023-03-21: qty 4

## 2023-03-21 MED ORDER — MIDAZOLAM HCL 2 MG/2ML IJ SOLN
INTRAMUSCULAR | Status: AC
  Administered 2023-03-21: 2 mg
  Filled 2023-03-21: qty 2

## 2023-03-21 MED ORDER — SODIUM CHLORIDE 0.9% FLUSH
10.0000 mL | Freq: Two times a day (BID) | INTRAVENOUS | Status: DC
  Administered 2023-03-21: 30 mL
  Administered 2023-03-21 – 2023-03-30 (×19): 10 mL
  Administered 2023-03-31: 40 mL
  Administered 2023-04-01 – 2023-04-03 (×5): 10 mL

## 2023-03-21 MED ORDER — DOCUSATE SODIUM 50 MG/5ML PO LIQD
100.0000 mg | Freq: Two times a day (BID) | ORAL | Status: DC
  Administered 2023-03-22 – 2023-03-26 (×10): 100 mg via ORAL
  Filled 2023-03-21 (×10): qty 10

## 2023-03-21 MED ORDER — FUROSEMIDE 10 MG/ML IJ SOLN
80.0000 mg | Freq: Two times a day (BID) | INTRAMUSCULAR | Status: DC
  Administered 2023-03-21 – 2023-03-24 (×8): 80 mg via INTRAVENOUS
  Filled 2023-03-21 (×8): qty 8

## 2023-03-21 MED ORDER — INSULIN ASPART 100 UNIT/ML IJ SOLN
2.0000 [IU] | INTRAMUSCULAR | Status: DC
  Administered 2023-03-21: 2 [IU] via SUBCUTANEOUS
  Administered 2023-03-21: 4 [IU] via SUBCUTANEOUS
  Administered 2023-03-21 – 2023-03-24 (×9): 2 [IU] via SUBCUTANEOUS
  Administered 2023-03-24: 4 [IU] via SUBCUTANEOUS
  Administered 2023-03-24: 2 [IU] via SUBCUTANEOUS
  Administered 2023-03-24: 4 [IU] via SUBCUTANEOUS
  Administered 2023-03-25 (×2): 2 [IU] via SUBCUTANEOUS
  Administered 2023-03-25: 4 [IU] via SUBCUTANEOUS
  Administered 2023-03-25 – 2023-03-26 (×3): 2 [IU] via SUBCUTANEOUS
  Administered 2023-03-26 (×3): 4 [IU] via SUBCUTANEOUS
  Filled 2023-03-21 (×22): qty 1

## 2023-03-21 MED ORDER — INSULIN ASPART 100 UNIT/ML IJ SOLN
0.0000 [IU] | INTRAMUSCULAR | Status: DC
  Administered 2023-03-21: 3 [IU] via SUBCUTANEOUS
  Filled 2023-03-21: qty 1

## 2023-03-21 MED ORDER — SENNOSIDES 8.8 MG/5ML PO SYRP: 5.0000 mL | ORAL_SOLUTION | Freq: Every day | ORAL | Status: DC

## 2023-03-21 MED ORDER — PANTOPRAZOLE SODIUM 40 MG IV SOLR
40.0000 mg | INTRAVENOUS | Status: DC
  Administered 2023-03-21 – 2023-03-28 (×8): 40 mg via INTRAVENOUS
  Filled 2023-03-21 (×8): qty 10

## 2023-03-21 MED ORDER — SODIUM CHLORIDE 0.9 % IV SOLN
500.0000 mg | INTRAVENOUS | Status: DC
  Administered 2023-03-21: 500 mg via INTRAVENOUS
  Filled 2023-03-21: qty 5

## 2023-03-21 MED ORDER — HEPARIN BOLUS VIA INFUSION
3000.0000 [IU] | Freq: Once | INTRAVENOUS | Status: AC
  Administered 2023-03-21: 3000 [IU] via INTRAVENOUS
  Filled 2023-03-21: qty 3000

## 2023-03-21 MED ORDER — HEPARIN (PORCINE) 25000 UT/250ML-% IV SOLN
1800.0000 [IU]/h | INTRAVENOUS | Status: DC
Start: 2023-03-21 — End: 2023-03-29
  Administered 2023-03-21: 1500 [IU]/h via INTRAVENOUS
  Administered 2023-03-22 – 2023-03-29 (×12): 1800 [IU]/h via INTRAVENOUS
  Filled 2023-03-21 (×13): qty 250

## 2023-03-21 MED ORDER — LIDOCAINE HCL 1 % IJ SOLN
INTRAMUSCULAR | Status: AC
  Filled 2023-03-21: qty 10

## 2023-03-21 MED ORDER — DOCUSATE SODIUM 50 MG/5ML PO LIQD: 100.0000 mg | Freq: Two times a day (BID) | ORAL | Status: DC

## 2023-03-21 MED ORDER — POLYETHYLENE GLYCOL 3350 17 G PO PACK
17.0000 g | PACK | Freq: Every day | ORAL | Status: DC
  Administered 2023-03-22: 17 g
  Filled 2023-03-21: qty 1

## 2023-03-21 MED ORDER — HEPARIN BOLUS VIA INFUSION
3000.0000 [IU] | Freq: Once | INTRAVENOUS | Status: DC
  Filled 2023-03-21: qty 3000

## 2023-03-21 MED ORDER — PERFLUTREN LIPID MICROSPHERE
1.0000 mL | INTRAVENOUS | Status: AC | PRN
  Administered 2023-03-21: 6 mL via INTRAVENOUS

## 2023-03-21 MED ORDER — NOREPINEPHRINE 16 MG/250ML-% IV SOLN
0.0000 ug/min | INTRAVENOUS | Status: DC
  Filled 2023-03-21: qty 250

## 2023-03-21 MED ORDER — SODIUM CHLORIDE 0.9% FLUSH: 10.0000 mL | INTRAVENOUS | Status: DC | PRN

## 2023-03-21 MED ORDER — METHYLPREDNISOLONE SODIUM SUCC 40 MG IJ SOLR
40.0000 mg | Freq: Every day | INTRAMUSCULAR | Status: DC
  Administered 2023-03-21: 40 mg via INTRAVENOUS
  Filled 2023-03-21: qty 1

## 2023-03-21 MED ORDER — FAMOTIDINE IN NACL 20-0.9 MG/50ML-% IV SOLN: 20.0000 mg | INTRAVENOUS | Status: DC

## 2023-03-21 MED ORDER — PROPOFOL 1000 MG/100ML IV EMUL
0.0000 ug/kg/min | INTRAVENOUS | Status: DC
  Administered 2023-03-21 (×3): 20 ug/kg/min via INTRAVENOUS
  Administered 2023-03-21: 10 ug/kg/min via INTRAVENOUS
  Administered 2023-03-21: 30 ug/kg/min via INTRAVENOUS
  Administered 2023-03-22 (×3): 35 ug/kg/min via INTRAVENOUS
  Administered 2023-03-22: 20 ug/kg/min via INTRAVENOUS
  Administered 2023-03-22: 35 ug/kg/min via INTRAVENOUS
  Administered 2023-03-22: 20 ug/kg/min via INTRAVENOUS
  Administered 2023-03-22 – 2023-03-23 (×2): 35 ug/kg/min via INTRAVENOUS
  Administered 2023-03-23: 25 ug/kg/min via INTRAVENOUS
  Administered 2023-03-23: 35 ug/kg/min via INTRAVENOUS
  Administered 2023-03-23: 15 ug/kg/min via INTRAVENOUS
  Administered 2023-03-23: 35 ug/kg/min via INTRAVENOUS
  Administered 2023-03-23 – 2023-03-24 (×2): 25 ug/kg/min via INTRAVENOUS
  Administered 2023-03-24 (×2): 15 ug/kg/min via INTRAVENOUS
  Administered 2023-03-24: 25 ug/kg/min via INTRAVENOUS
  Administered 2023-03-25 (×2): 15 ug/kg/min via INTRAVENOUS
  Filled 2023-03-21 (×24): qty 100

## 2023-03-21 NOTE — Progress Notes (Signed)
 OT Cancellation Note  Patient Details Name: Jeno Calleros MRN: [id number] DOB: 05-15-1975   Cancelled Treatment:    Reason Eval/Treat Not Completed: Patient not medically ready. Chart reviewed. Pt now intubated and transferred to ICU, not appropriate for OT evaluation at this time. Will sign off, please re-consult as medically appropriate.   Elston Slot, M.S. OTR/L  03/21/23, 9:01 AM  ascom [phone]

## 2023-03-21 NOTE — Plan of Care (Signed)
  Problem: Fluid Volume: Goal: Ability to maintain a balanced intake and output will improve Outcome: Progressing   Problem: Metabolic: Goal: Ability to maintain appropriate glucose levels will improve Outcome: Progressing   Problem: Tissue Perfusion: Goal: Adequacy of tissue perfusion will improve Outcome: Progressing   Problem: Clinical Measurements: Goal: Ability to maintain clinical measurements within normal limits will improve Outcome: Progressing

## 2023-03-21 NOTE — Progress Notes (Signed)
 ANTICOAGULATION CONSULT NOTE  Pharmacy Consult for heparin  infusion Indication: atrial fibrillation  No Known Allergies  Patient Measurements: Weight: (!) 155.6 kg (343 lb 0.6 oz) Heparin  Dosing Weight: 101.6 kg  Vital Signs: Temp: 98.4 F (36.9 C) (02/04 0400) Temp Source: Axillary (02/03 2346) BP: 139/98 (02/04 0400) Pulse Rate: 64 (02/04 0400)  Labs: Recent Labs    03/20/23 1046 03/20/23 1513 03/20/23 1844 03/21/23 0246  HGB 18.9*  --   --  19.1*  HCT 61.2*  --   --  59.9*  PLT 175  --   --  193  LABPROT 23.3*  --   --   --   INR 2.0*  --   --   --   CREATININE 2.19*  --   --  1.73*  TROPONINIHS 122* 152* 121*  --     Estimated Creatinine Clearance: 73.4 mL/min (A) (by C-G formula based on SCr of 1.73 mg/dL (H)).   Medical History: Past Medical History:  Diagnosis Date   Atrial flutter (HCC)    a. CHA2DS2VASc = 4-->xarelto .   CAD (coronary artery disease)    a. 04/2020 Cath: LM nl, LAD 30ost/p, LCX nl, RCA 30p/m.   Chronic combined systolic and diastolic CHF (congestive heart failure) (HCC)    a. 03/2020 Echo: EF 40-45%; b. 05/2021 Echo: EF 40-45%; c. 10/2022 Echo: EF 55-60%, no rwma, sev LVH, RVSP 23.1 mmHg.   Diabetes mellitus without complication (HCC)    Hyperlipidemia    Hypertension    NICM (nonischemic cardiomyopathy) (HCC)    Nonadherence to medication    Obesity    Sleep apnea, obstructive     Medications:  PTA Meds: Rivaroxaban  20 mg qpm, last dose 03/20/23 @ 1637  Assessment: Pt is a 48 yo male with h/o A. Fib, on Xarelto , admitted for CHF exacerbation, now found with elevated troponins likely demand ischemia, being transitioned to heparin  infusion. Cardiology consulted.  Goal of Therapy:  Heparin  level 0.3-0.7 units/ml aPTT 66-102 seconds Monitor platelets by anticoagulation protocol: Yes   Plan:  No initial bolus d/t recent DOAC administration Start heparin  infusion at 1500 units/hr today at 1600 Will follow aPTT until correlation w/ HL  confirmed Will check aPTT in 6 hr after start of infusion HL & CBC daily while on heparin   Rankin CANDIE Dills, PharmD, [facility] 03/21/2023 4:41 AM

## 2023-03-21 NOTE — Plan of Care (Signed)
  Problem: Fluid Volume: Goal: Ability to maintain a balanced intake and output will improve Outcome: Progressing   Problem: Metabolic: Goal: Ability to maintain appropriate glucose levels will improve Outcome: Progressing   Problem: Skin Integrity: Goal: Risk for impaired skin integrity will decrease Outcome: Progressing   Problem: Tissue Perfusion: Goal: Adequacy of tissue perfusion will improve Outcome: Progressing   Problem: Clinical Measurements: Goal: Ability to maintain clinical measurements within normal limits will improve Outcome: Progressing Goal: Will remain free from infection Outcome: Progressing Goal: Respiratory complications will improve Outcome: Progressing Goal: Cardiovascular complication will be avoided Outcome: Progressing   Problem: Pain Managment: Goal: General experience of comfort will improve and/or be controlled Outcome: Progressing   Problem: Skin Integrity: Goal: Risk for impaired skin integrity will decrease Outcome: Progressing

## 2023-03-21 NOTE — Progress Notes (Signed)
Peripherally Inserted Central Catheter Placement  The IV Nurse has discussed with the patient and/or persons authorized to consent for the patient, the purpose of this procedure and the potential benefits and risks involved with this procedure.  The benefits include less needle sticks, lab draws from the catheter, and the patient may be discharged home with the catheter. Risks include, but not limited to, infection, bleeding, blood clot (thrombus formation), and puncture of an artery; nerve damage and irregular heartbeat and possibility to perform a PICC exchange if needed/ordered by physician.  Alternatives to this procedure were also discussed.  Bard Power PICC patient education guide, fact sheet on infection prevention and patient information card has been provided to patient /or left at bedside.  Consent obtained via telephone with sister   PICC Placement Documentation  PICC Triple Lumen 03/21/23 Right Brachial 40 cm 0 cm (Active)  Indication for Insertion or Continuance of Line Vasoactive infusions 03/21/23 1300  Exposed Catheter (cm) 0 cm 03/21/23 1300  Site Assessment Clean, Dry, Intact 03/21/23 1300  Lumen #1 Status Flushed;Saline locked;Blood return noted 03/21/23 1300  Lumen #2 Status Flushed;Saline locked;Blood return noted 03/21/23 1300  Lumen #3 Status Flushed;Saline locked;Blood return noted 03/21/23 1300  Dressing Type Transparent;Securing device 03/21/23 1300  Dressing Status Antimicrobial disc/dressing in place;Clean, Dry, Intact 03/21/23 1300  Line Care Connections checked and tightened 03/21/23 1300  Line Adjustment (NICU/IV Team Only) No 03/21/23 1300  Dressing Intervention New dressing 03/21/23 1300  Dressing Change Due 03/28/23 03/21/23 1300       Franne Grip Renee 03/21/2023, 1:12 PM

## 2023-03-21 NOTE — Consult Note (Addendum)
NAME:  Gregory Mckenzie, MRN:  [id number], DOB:  [birth year], LOS: 1 ADMISSION DATE:  03/20/2023, CONSULTATION DATE:  03/20/23 REFERRING MD:  Larkin Ina REASON FOR CONSULT: Acute respiratory distress  HPI  48 y.o morbidly obese male with significant PMH of chronic combined systolic and diastolic CHF, atrial fibrillation/flutter, T2DM, HLD, HTN, OSA not on CPAP, obesity hypoventilation syndrome, former smoker, and polycythemia who presented to the ED from the heart failure clinic with chief complaints of acute respiratory distress.  Per ED reports patient was seen in heart failure clinic for evaluation of shortness of breath with very little exertion, fatigue, worsening abdominal distention, cough, worsening pedal edema and weight gain.  He ran out of his oxygen last night around 9 PM.  When he was noted with sats in the lower 60s on room air she was placed on 3L which was increased to 5 L with only improvement up to 83%.  Patient was sent to the ED for further evaluation   ED Course: Initial vital signs showed HR of 86 beats/minute, BP 153/130 mm Hg, the RR 20 breaths/minute, and the oxygen saturation 84% on 5L and a temperature of 98.57F (37.1C).  Pertinent Labs/Diagnostics Findings: Na+/ K+: 140/4.8.  Glucose: 147.  BUN/Cr.:  44/2.19  WBC: 4.2 K/L COVID PCR: Negative,  troponin: 122~152~121  BNP:1596.6   VBG: pO2 47; pCO2 102; pH 7.19;  HCO3 39.0, %O2 Sat 58.6.  CXR> Cardiomegaly, vascular congestion  Medication administered in the ED: Disposition: Patient was given IV Lasix, placed on BiPAP and admitted to hospitalist service.  He however continued declined to wear his BiPAP.  He was given sedation with no improvement.  While still holding in the ED he was noted to be more lethargic and and less responsive therefore he was intubated for airway protection.  PCCM consulted  Past Medical History  chronic combined systolic and diastolic CHF, atrial fibrillation/flutter, T2DM, HLD, HTN, OSA  not on CPAP, obesity hypoventilation syndrome, former smoker, and polycythemia   Significant Hospital Events   2/3: Admitted to hospitalist service with acute on chronic hypoxic hypercapnic respiratory failure in the setting of CHF exacerbation. Failed BiPAP requiring intubation.  PCCM consulted  Consults:  Cardiology  Procedures:  2/3: Intubation  Significant Diagnostic Tests:  2/3: Chest Xray> IMPRESSION: Cardiomegaly, vascular congestion.  Micro Data:  2/3: SARS-CoV-2 PCR> negative 2/3: Influenza PCR> negative 2/3: Blood culture x2> 2/3: MRSA PCR>>  2/3: Strep pneumo 2/3: tracheal aspirate  Antimicrobials:  Azithromycin 2/3> Ceftriaxone 2/3>  OBJECTIVE  Blood pressure 100/88, pulse 76, temperature 98.3 F (36.8 C), temperature source Axillary, resp. rate 19, weight (!) 155.6 kg, SpO2 95%.    Vent Mode: PRVC FiO2 (%):  [28 %-100 %] 100 % Set Rate:  [24 bmp] 24 bmp Vt Set:  [550 mL] 550 mL PEEP:  [8 cmH20] 8 cmH20 Plateau Pressure:  [35 cmH20] 35 cmH20   Intake/Output Summary (Last 24 hours) at 03/21/2023 0258 Last data filed at 03/21/2023 0200 Gross per 24 hour  Intake 128.63 ml  Output 2500 ml  Net -2371.37 ml   Filed Weights   03/21/23 0227  Weight: (!) 155.6 kg   PHYSICAL EXAMINATION:  GENERAL: 48 year-old critically ill patient lying in the bed intubated and sedated EYES: PEERLA. No scleral icterus. Extraocular muscles intact.  HEENT: Head atraumatic, normocephalic. Oropharynx and nasopharynx clear. ETT in place NECK:  No JVD, supple  LUNGS: Decreased breath sounds bilaterally.   CARDIOVASCULAR: S1, S2 normal. No murmurs, rubs, or gallops.  ABDOMEN: Soft, NTND EXTREMITIES: bilateral upper and lower extremities are edematous. skin cool, and pulses are palpable. NEUROLOGIC:edated. He does arouse with suctioning, but is unable to open his eyes to commands. SKIN: No obvious rash, lesion, or ulcer. Warm to touch  Labs/imaging that I havepersonally reviewed   (right click and "Reselect all SmartList Selections" daily)     Labs   CBC: Recent Labs  Lab 03/20/23 1046  WBC 4.2  HGB 18.9*  HCT 61.2*  MCV 93.7  PLT 175   Basic Metabolic Panel: Recent Labs  Lab 03/20/23 1046  NA 140  K 4.8  CL 96*  CO2 32  GLUCOSE 147*  BUN 44*  CREATININE 2.19*  CALCIUM 8.9   GFR: Estimated Creatinine Clearance: 58 mL/min (A) (by C-G formula based on SCr of 2.19 mg/dL (H)). Recent Labs  Lab 03/20/23 1046  WBC 4.2    Liver Function Tests: No results for input(s): "AST", "ALT", "ALKPHOS", "BILITOT", "PROT", "ALBUMIN" in the last 168 hours. No results for input(s): "LIPASE", "AMYLASE" in the last 168 hours. No results for input(s): "AMMONIA" in the last 168 hours.  ABG    Component Value Date/Time   PHART 7.33 (L) 05/24/2021 1516   PCO2ART 62 (H) 05/24/2021 1516   PO2ART 71 (L) 05/24/2021 1516   HCO3 34.3 (H) 03/21/2023 0246   TCO2 31 05/08/2020 1324   TCO2 32 05/08/2020 1324   O2SAT 85.1 03/21/2023 0246     Coagulation Profile: Recent Labs  Lab 03/20/23 1046  INR 2.0*    Cardiac Enzymes: No results for input(s): "CKTOTAL", "CKMB", "CKMBINDEX", "TROPONINI" in the last 168 hours.  HbA1C: Hgb A1c MFr Bld  Date/Time Value Ref Range Status  09/28/2022 11:34 AM 7.0 (H) 4.8 - 5.6 % Final    Comment:             Prediabetes: 5.7 - 6.4          Diabetes: >6.4          Glycemic control for adults with diabetes: <7.0   12/21/2021 11:08 AM 6.6 (H) 4.8 - 5.6 % Final    Comment:    (NOTE) Pre diabetes:          5.7%-6.4%  Diabetes:              >6.4%  Glycemic control for   <7.0% adults with diabetes     CBG: Recent Labs  Lab 03/20/23 1615 03/20/23 2326  GLUCAP 107* 131*    Review of Systems:   Unable to be obtained secondary to the patient's intubated and sedated status.   Past Medical History  He,  has a past medical history of Atrial flutter (HCC), CAD (coronary artery disease), Chronic combined systolic and  diastolic CHF (congestive heart failure) (HCC), Diabetes mellitus without complication (HCC), Hyperlipidemia, Hypertension, NICM (nonischemic cardiomyopathy) (HCC), Nonadherence to medication, Obesity, and Sleep apnea, obstructive.   Surgical History    Past Surgical History:  Procedure Laterality Date   RIGHT/LEFT HEART CATH AND CORONARY ANGIOGRAPHY N/A 05/08/2020   Procedure: RIGHT/LEFT HEART CATH AND CORONARY ANGIOGRAPHY;  Surgeon: Laurey Morale, MD;  Location: [facility] INVASIVE CV LAB;  Service: Cardiovascular;  Laterality: N/A;    Social History   reports that he quit smoking about 2 years ago. His smoking use included cigarettes. He started smoking about 29 years ago. He has a 27 pack-year smoking history. He has never used smokeless tobacco. He reports that he does not currently use alcohol. He reports current drug  use. Frequency: 7.00 times per week. Drug: Marijuana.   Family History   His family history includes Hypertension in his brother and father; Pulmonary embolism in his brother.   Allergies No Known Allergies   Home Medications  Prior to Admission medications   Medication Sig Start Date End Date Taking? Authorizing Provider  atorvastatin (LIPITOR) 40 MG tablet Take 2 tablets (80 mg total) by mouth at bedtime. 03/16/23  Yes Delma Freeze, FNP  carvedilol (COREG) 6.25 MG tablet Take 1 tablet (6.25 mg total) by mouth 2 (two) times daily with a meal. 09/20/22  Yes Clarisa Kindred A, FNP  empagliflozin (JARDIANCE) 10 MG TABS tablet Take 1 tablet (10 mg total) by mouth daily before breakfast. 09/21/22  Yes Delma Freeze, FNP  rivaroxaban (XARELTO) 20 MG TABS tablet Take 1 tablet (20 mg total) by mouth daily with supper. 09/20/22  Yes Hackney, Tina A, FNP  sacubitril-valsartan (ENTRESTO) 49-51 MG Take 1 tablet by mouth 2 (two) times daily. 03/07/23  Yes Clarisa Kindred A, FNP  spironolactone (ALDACTONE) 25 MG tablet Take 1 tablet (25 mg total) by mouth once daily. 09/20/22  Yes Clarisa Kindred A,  FNP  torsemide (DEMADEX) 20 MG tablet Take 2 tablets (40 mg total) by mouth daily. 03/07/23  Yes Clarisa Kindred A, FNP  empagliflozin (JARDIANCE) 10 MG TABS tablet Take 1 tablet (10 mg total) by mouth daily before breakfast. Patient not taking: Reported on 03/20/2023 03/07/23   Clarisa Kindred A, FNP  OXYGEN Inhale 4 L into the lungs at bedtime.    [provider]  Scheduled Meds:  atorvastatin  80 mg Oral QHS   azithromycin  500 mg Oral Daily   carvedilol  6.25 mg Oral BID WC   Chlorhexidine Gluconate Cloth  6 each Topical Daily   docusate  100 mg Per Tube BID   furosemide  80 mg Intravenous TID   insulin aspart  0-15 Units Subcutaneous TID WC   insulin aspart  3 Units Subcutaneous TID WC   ipratropium-albuterol  3 mL Nebulization Q6H   mouth rinse  15 mL Mouth Rinse Q2H   polyethylene glycol  17 g Per Tube Daily   predniSONE  40 mg Oral Q breakfast   rivaroxaban  20 mg Oral Q supper   sodium chloride flush  3 mL Intravenous Q12H   spironolactone  25 mg Oral Daily   Continuous Infusions:  sodium chloride     dexmedetomidine (PRECEDEX) IV infusion 0.5 mcg/kg/hr (03/21/23 0322)   famotidine (PEPCID) IV     fentaNYL infusion INTRAVENOUS 100 mcg/hr (03/21/23 0324)   norepinephrine (LEVOPHED) Adult infusion 3 mcg/min (03/21/23 0353)   propofol (DIPRIVAN) infusion     PRN Meds:.sodium chloride, albuterol, fentaNYL, ondansetron **OR** ondansetron (ZOFRAN) IV, mouth rinse, sodium chloride flush  Active Hospital Problem list   See systems below  Assessment & Plan:  #Acute on Chronic Hypoxic Hypercapnic Respiratory Failure #Pulmonary Edema/CHF exacerbation #?Pneumonia Hx heart failure on chronic O2, OSA not on CPAP and hypoventilation syndrome -LTVV -VAP prevention protocol -PRN &Scheduled bronchodilators -daily SAT & SBT -Wean PEEP and FiO2 for sats greater than 90% -Follow chest x-ray, ABG prn.  -Obtain tracheal aspirate, strep pneumo, viral panel,Bcx -Empiric abx -prn  fentanyl, propofol  for RASS -1   #Acute on Chronic Decompensated HF 2/2 NICM #Hypertension #Elevated troponins likely demand ischemia #PAF Hx Non Obstructive CAD  HLD  Hx HFpEF prior Echo 10/20/22: EF 55-60% with severe LVH, normal PA pressure of 23.1 mmHg  -Volume  overload with elevated BNP. -Place central line and check CVP  -Trend troponin until peaked -Hold Xarelto and start Heparin gtt -80mg  Lasix IV  x in the ED. Hold Torsemide for now continue IV Lasix as renal permits -Afterload, Goal MAP <70:Hold Entresto -GDMT: Hold carvedilol,Jardiance, ARB/ARNI/spiro iso AKI -Lipitor 40mg  daily  -Obtain 2D Echo -Cardiology consult,    #Acute Kidney Injury  Cr on admission: 2.19 above baseline -Strict I/O's -Daily BMP, replace electrolytes PRN -Avoid nephrotoxic agents as able, ensure adequate renal perfusion   #Acute Metabolic Encephalopathy due to Hypercapnia -Provide supportive care -treat Hypercapnia as above  #Diabetes Mellitus Type II -Check hemoglobin A1c -CBG's q4h; Target range of 140 to 180 -SSI -Follow ICU Hypo/Hyperglycemia protocol     Best practice:  Diet:  NPO Pain/Anxiety/Delirium protocol (if indicated): Yes (RASS goal -1) VAP protocol (if indicated): Yes DVT prophylaxis: Systemic AC GI prophylaxis: H2B Glucose control:  SSI Yes Central venous access:  N/A Arterial line:  N/A Foley:  Yes, and it is still needed Mobility:  bed rest  PT consulted: N/A Last date of multidisciplinary goals of care discussion []  Code Status:  full code Disposition: ICU   Critical care time: 45 minutes     Webb Silversmith DNP, CCRN, FNP-C, AGACNP-BC Acute Care & Family Nurse Practitioner [facility] PCCM on call pager [phone]

## 2023-03-21 NOTE — Progress Notes (Signed)
Unsuccessful attempt to place a Dubboff Harlon Ditty NP, notified. Plan to get one place with IR.

## 2023-03-21 NOTE — Progress Notes (Signed)
       CROSS COVER NOTE  NAME: Gregory Mckenzie MRN: [id number] DOB : [birth year]    Date of Service   03/21/2023   HPI/Events of Note   CN paged @ 2217 "this pt in ed 16 needs something to chill him out so he stays on the bipap just O2 via cannula isn't cutting it for him we gave him lasix so he keeps getting up to pee and yanks everything off declines Bipap earlier we have it on now and a condom cath but he's just not chilling out please and thank you". This NP responded that he will review the chart and formulate an appropriate intervention. @ 2227 CN nurse paged "he's fighting the bipap I turned to O2 up to 50% from 40%".  This NP arrived at bedside at 2230 and pt was not responsive to verbal or painful stimuli. Nursing reported that pt was awake prior to this. Given his hypercarbia and mentation change, this NP asked for care nurse to ask ED MD to present to bedside for possible RSI.   Interventions   Plan: RSI completed by ED MD. ICU APP called and report given on pt and PCCM assumed care at this time.  Sedation and transfer orders placed.       Gregory Placide Lamin [name], MSN, APRN, AGACNP-BC [facility] [place] Pager: [phone]. Check Amion for Availability

## 2023-03-21 NOTE — Progress Notes (Signed)
 Initial Nutrition Assessment  DOCUMENTATION CODES:   Morbid obesity  INTERVENTION:   If enteral access is gained:  Vital HP @60ml /hr- Initiate at 37ml/hr and increase by 10ml/hr q 8 hours until goal rate is reached.   ProSource TF 20- Give 60ml daily via tube, each supplement provides 80kcal and 20g of protein.   Free water  flushes 30ml q4 hours to maintain tube patency   Regimen provides 1520kcal/day, 146g/day protein and 1338ml/day of free water .   Pt at refeed risk; recommend monitor potassium, magnesium  and phosphorus labs daily until stable  Daily weights   NUTRITION DIAGNOSIS:   Inadequate oral intake related to inability to eat (pt sedated and ventilated) as evidenced by NPO status.  GOAL:   Provide needs based on ASPEN/SCCM guidelines  MONITOR:   Vent status, Labs, Weight trends, TF tolerance, I & O's, Skin  REASON FOR ASSESSMENT:   Consult Enteral/tube feeding initiation and management  ASSESSMENT:   48 y.o. male with medical history significant of DM, hyperlipidemia, sleep apnea (CPAP broken), HTN, paroxysmal atrial flutter, morbid obesity, polycythemia, CAD, CKD III, marijuana use, possible obesity hypoventilation syndrome, previous tobacco use and chronic HFpEF who is admitted with acute respiratory failure with hypoxia, acute on chronic HFpEF, NSTEMI and AKI.  Pt sedated and ventilated. OGT unable to be placed by nursing. Plan is to attempt dobhoff tube placement today. Will initiate tube feeds if enteral access is able to be gained. Per chart, pt with ~40lb weight gain over the past 5 months; pt is noted to have anasarca.   Medications reviewed and include: colace, lasix , insulin , protonix , miralax , azithromycin , heparin , levophed , KCl, propofol    Labs reviewed: K 4.2 wnl, BUN 42(H), creat 1.73(H), Mg 2.2 wnl Wbc- 3.9(L) Cbgs- 111, 67, 115, 126 x 24 hrs  AIC 7.0(H)- 09/2022  Patient is currently intubated on ventilator support MV: 12.9 L/min Temp  (24hrs), Avg:98 F (36.7 C), Min:97.5 F (36.4 C), Max:98.4 F (36.9 C)  Propofol : 18.67 ml/hr- provides 493kcal/day   MAP >80mmHg   UOP-   NUTRITION - FOCUSED PHYSICAL EXAM:  Flowsheet Row Most Recent Value  Orbital Region No depletion  Upper Arm Region No depletion  Thoracic and Lumbar Region No depletion  Buccal Region No depletion  Temple Region No depletion  Clavicle Bone Region No depletion  Clavicle and Acromion Bone Region No depletion  Scapular Bone Region No depletion  Dorsal Hand No depletion  Patellar Region No depletion  Anterior Thigh Region No depletion  Posterior Calf Region No depletion  Edema (RD Assessment) Severe  Hair Reviewed  Eyes Reviewed  Mouth Reviewed  Skin Reviewed  Nails Reviewed   Diet Order:   Diet Order     None      EDUCATION NEEDS:   Not appropriate for education at this time  Skin:  Skin Assessment: Reviewed RN Assessment  Last BM:  PTA  Height:   Ht Readings from Last 1 Encounters:  03/13/23 5' 6 (1.676 m)    Weight:   Wt Readings from Last 1 Encounters:  03/21/23 (!) 155.6 kg    Ideal Body Weight:  64.5 kg  BMI:  Body mass index is 55.37 kg/m.  Estimated Nutritional Needs:   Kcal:  1420-1613kcal/day  Protein:  140-160g/day  Fluid:  1.7-1.9L/day  Augustin Shams MS, RD, LDN If unable to be reached, please send secure chat to RD inpatient available from 8:00a-4:00p daily

## 2023-03-21 NOTE — Consult Note (Addendum)
 Advanced Heart Failure Team Consult Note   Primary Physician: Pcp, No Cardiologist:  Lonni Hanson, MD  Reason for Consultation: A/C HFpEF  HPI:    Gregory Mckenzie is seen today for evaluation of A/C HFpEF at the request of Dr  Darron.   Patient is intubated. Therefore history has been obtained from chart review.   Gregory Mckenzie is 65 uyear old with history HFpEF, DMII, HLD, OSA, HTN, PAF, chronic hypoxic respiratory failure on 2-4 liters, and polycythemia. 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 Family history of HTN Dad/Brother. Brother also had PE.   10/20/2022 Echo LVEF 55-60% RV not well visualized.   Followed in HF clinic at [facility].  He was seen 03/07/23. He had been out of entresto , jardiance , and torsemide . He was prescribed Furoscix  x2 days. Poor response. On 1/23 returned to HF clinic. Remained volume overloaded. Given 80 mg IV lasix  x1 then placed torsemide  40 mg daily.   Yesterday he had follow up in the HF clinic. Hypoxic on arrival with marked volume overload. O2 sats in 60s. Of note he ran out of his oxygen prior to the visit. Sent to the ED with marked volume overload complicated by acute hypoxic respiratory failure. CXR with vascular congestion. Intubated in the ED. Unable to place OG due to bleeding. Started on IV lasix  40 mg  given 2 doses. Pertinent labs: lactic acid 1.4, BNP 1596, WBC 4.2, Hgb 18.9. HS Trop 122>152, and HIV NR. Hypotensive, started on norpepi.    Brisk diuresis noted. Negative 4.4 liters.   Home Medications Prior to Admission medications   Medication Sig Start Date End Date Taking? Authorizing Provider  atorvastatin  (LIPITOR ) 40 MG tablet Take 2 tablets (80 mg total) by mouth at bedtime. 03/16/23  Yes Donette Ellouise LABOR, FNP  carvedilol  (COREG ) 6.25 MG tablet Take 1 tablet (6.25 mg total) by mouth 2 (two) times daily with a meal. 09/20/22  Yes Donette Ellouise A, FNP  empagliflozin  (JARDIANCE ) 10 MG TABS tablet Take 1 tablet (10 mg total) by mouth daily  before breakfast. 09/21/22  Yes Donette Ellouise A, FNP  rivaroxaban  (XARELTO ) 20 MG TABS tablet Take 1 tablet (20 mg total) by mouth daily with supper. 09/20/22  Yes Hackney, Ellouise A, FNP  sacubitril -valsartan  (ENTRESTO ) 49-51 MG Take 1 tablet by mouth 2 (two) times daily. 03/07/23  Yes Hackney, Ellouise A, FNP  spironolactone  (ALDACTONE ) 25 MG tablet Take 1 tablet (25 mg total) by mouth once daily. 09/20/22  Yes Hackney, Ellouise A, FNP  torsemide  (DEMADEX ) 20 MG tablet Take 2 tablets (40 mg total) by mouth daily. 03/07/23  Yes Donette Ellouise A, FNP  empagliflozin  (JARDIANCE ) 10 MG TABS tablet Take 1 tablet (10 mg total) by mouth daily before breakfast. Patient not taking: Reported on 03/20/2023 03/07/23   Donette Ellouise A, FNP  OXYGEN Inhale 4 L into the lungs at bedtime.    [provider]    Past Medical History: Past Medical History:  Diagnosis Date   Atrial flutter (HCC)    a. CHA2DS2VASc = 4-->xarelto .   CAD (coronary artery disease)    a. 04/2020 Cath: LM nl, LAD 30ost/p, LCX nl, RCA 30p/m.   Chronic combined systolic and diastolic CHF (congestive heart failure) (HCC)    a. 03/2020 Echo: EF 40-45%; b. 05/2021 Echo: EF 40-45%; c. 10/2022 Echo: EF 55-60%, no rwma, sev LVH, RVSP 23.1 mmHg.   Diabetes mellitus without complication (HCC)    Hyperlipidemia    Hypertension    NICM (nonischemic  cardiomyopathy) (HCC)    Nonadherence to medication    Obesity    Sleep apnea, obstructive     Past Surgical History: Past Surgical History:  Procedure Laterality Date   RIGHT/LEFT HEART CATH AND CORONARY ANGIOGRAPHY N/A 05/08/2020   Procedure: RIGHT/LEFT HEART CATH AND CORONARY ANGIOGRAPHY;  Surgeon: Rolan Ezra RAMAN, MD;  Location: [facility] INVASIVE CV LAB;  Service: Cardiovascular;  Laterality: N/A;    Family History: Family History  Problem Relation Age of Onset   Hypertension Father    Hypertension Brother    Pulmonary embolism Brother     Social History: Social History   Socioeconomic History    Marital status: Single    Spouse name: Not on file   Number of children: Not on file   Years of education: Not on file   Highest education level: Not on file  Occupational History   Not on file  Tobacco Use   Smoking status: Former    Current packs/day: 0.00    Average packs/day: 1 pack/day for 27.0 years (27.0 ttl pk-yrs)    Types: Cigarettes    Start date: 04/06/1993    Quit date: 04/06/2020    Years since quitting: 2.9   Smokeless tobacco: Never  Vaping Use   Vaping status: Never Used  Substance and Sexual Activity   Alcohol  use: Not Currently   Drug use: Yes    Frequency: 7.0 times per week    Types: Marijuana    Comment: daily   Sexual activity: Yes    Partners: Female    Birth control/protection: None  Other Topics Concern   Not on file  Social History Narrative   Not on file   Social Drivers of Health   Financial Resource Strain: Low Risk  (04/09/2020)   Overall Financial Resource Strain (CARDIA)    Difficulty of Paying Living Expenses: Not very hard  Food Insecurity: Patient Unable To Answer (03/21/2023)   Hunger Vital Sign    Worried About Running Out of Food in the Last Year: Patient unable to answer    Ran Out of Food in the Last Year: Patient unable to answer  Transportation Needs: Patient Unable To Answer (03/21/2023)   PRAPARE - Transportation    Lack of Transportation (Medical): Patient unable to answer    Lack of Transportation (Non-Medical): Patient unable to answer  Physical Activity: Inactive (04/09/2020)   Exercise Vital Sign    Days of Exercise per Week: 0 days    Minutes of Exercise per Session: 0 min  Stress: Not on file  Social Connections: Not on file    Allergies:  No Known Allergies  Objective:    Vital Signs:   Temp:  [97.5 F (36.4 C)-98.8 F (37.1 C)] 97.6 F (36.4 C) (02/04 0730) Pulse Rate:  [60-98] 66 (02/04 0745) Resp:  [9-31] 24 (02/04 0745) BP: (87-153)/(55-130) 87/67 (02/04 0745) SpO2:  [76 %-97 %] 90 % (02/04 0745) FiO2  (%):  [28 %-100 %] 100 % (02/04 0730) Weight:  [155.6 kg] 155.6 kg (02/04 0227)    Weight change: Filed Weights   03/21/23 0227  Weight: (!) 155.6 kg    Intake/Output:   Intake/Output Summary (Last 24 hours) at 03/21/2023 0838 Last data filed at 03/21/2023 0749 Gross per 24 hour  Intake 370.72 ml  Output 4750 ml  Net -4379.28 ml      Physical Exam    General:  Intubated. Neck: supple. JVP difficult to assess. Carotids 2+ bilat; no bruits. No lymphadenopathy or thyromegaly  appreciated. Cor: PMI nondisplaced. Regular rate & rhythm. No rubs, gallops or murmurs. Lungs: clear Abdomen: soft, nontender, nondistended.  Extremities: no cyanosis, clubbing, rash, R and LLE 3+ edema extending to thighs.    Telemetry   SR with PVCs 60-80s   EKG    SR with PVCs   Labs   Basic Metabolic Panel: Recent Labs  Lab 03/20/23 1046 03/21/23 0246 03/21/23 0251  NA 140 142  --   K 4.8 4.2  --   CL 96* 98  --   CO2 32 26  --   GLUCOSE 147* 125*  --   BUN 44* 42*  --   CREATININE 2.19* 1.73*  --   CALCIUM  8.9 8.9  --   MG  --   --  2.2    Liver Function Tests: Recent Labs  Lab 03/21/23 0246  AST 20  ALT 30  ALKPHOS 165*  BILITOT 2.5*  PROT 6.6  ALBUMIN 3.3*   No results for input(s): LIPASE, AMYLASE in the last 168 hours. No results for input(s): AMMONIA in the last 168 hours.  CBC: Recent Labs  Lab 03/20/23 1046 03/21/23 0246  WBC 4.2 3.9*  HGB 18.9* 19.1*  HCT 61.2* 59.9*  MCV 93.7 91.7  PLT 175 193    Cardiac Enzymes: No results for input(s): CKTOTAL, CKMB, CKMBINDEX, TROPONINI in the last 168 hours.  BNP: BNP (last 3 results) Recent Labs    03/07/23 1207 03/09/23 0957 03/20/23 1046  BNP 2,259.3* 1,857.9* 1,596.6*    ProBNP (last 3 results) No results for input(s): PROBNP in the last 8760 hours.   CBG: Recent Labs  Lab 03/20/23 1615 03/20/23 2326 03/21/23 0335 03/21/23 0716  GLUCAP 107* 131* 126* 115*    Coagulation  Studies: Recent Labs    03/20/23 1046  LABPROT 23.3*  INR 2.0*     Imaging   DG Chest Portable 1 View Result Date: 03/21/2023 CLINICAL DATA:  Intubation EXAM: PORTABLE CHEST 1 VIEW COMPARISON:  03/20/2023 FINDINGS: Endotracheal tube tip is just below the level of the clavicular heads. Unchanged cardiomegaly and pulmonary vascular congestion. IMPRESSION: Endotracheal tube tip just below the level of the clavicular heads. Electronically Signed   By: Franky Stanford M.D.   On: 03/21/2023 01:22   DG Chest 2 View Result Date: 03/20/2023 CLINICAL DATA:  Low O2 sats, weight gain. EXAM: CHEST - 2 VIEW COMPARISON:  05/25/2021 FINDINGS: Cardiomegaly, vascular congestion. No overt edema, confluent opacities or effusions. No acute bony abnormality. IMPRESSION: Cardiomegaly, vascular congestion. Electronically Signed   By: Franky Crease M.D.   On: 03/20/2023 11:21     Medications:     Current Medications:  atorvastatin   80 mg Oral QHS   Chlorhexidine  Gluconate Cloth  6 each Topical Daily   docusate  100 mg Oral BID   furosemide   40 mg Intravenous BID   insulin  aspart  0-20 Units Subcutaneous Q4H   ipratropium-albuterol   3 mL Nebulization Q6H   methylPREDNISolone  (SOLU-MEDROL ) injection  40 mg Intravenous Daily   mouth rinse  15 mL Mouth Rinse Q2H   polyethylene glycol  17 g Per Tube Daily    Infusions:  azithromycin      famotidine  (PEPCID ) IV     fentaNYL  infusion INTRAVENOUS 125 mcg/hr (03/21/23 0749)   heparin      norepinephrine  (LEVOPHED ) Adult infusion 3 mcg/min (03/21/23 0749)   propofol  (DIPRIVAN ) infusion 20 mcg/kg/min (03/21/23 0749)      Patient Profile    Gregory Mckenzie is 10 uyear old  with history HFpEF, DMII, HLD, OSA, HTN, PAF, and polycythemia.   Admitted with A/C HFpEF & Acute Hypoxic Respiratory Failure.   Assessment/Plan  1. A/C HFpEF/ --Shock Echo 10/2022 preserved EF. Repeat ECHO today.  Failed outpatient [place] and IV lasix . Admitted with marked volume overload. BNP  elevated.  - Remains volume overloaded.  Increase lasix  80 mg twice a day . Give 2 K runs. Repeat BMET this afternoon.  - Continue norepi . Unable to place central line. Place PICC. Check CVP/CO-OX  - He doesn't have OG due to bleeding so no oral meds.  2. Acute Hypoxic Respiratory Failure  -Failed Bipap, requiring intubation. Remains intubated.  -Respiratory panel negative.  -On azithromycin .   3. CKD Stage IIIa -Creatinine baseline 1.4-1.6 -Follow BMET with diuresis.   4. Polycythemia -Hgb 18.9 .  -Brother had PE.   5. DMII  6.  Obesity   Discussed with Dr Isadora Dayhoff of Stay: 1  Amy Clegg, NP  03/21/2023, 8:38 AM  Advanced Heart Failure Team Pager [phone] (M-F; 7a - 5p)  Please contact [facility] for night-coverage after hours (4p -7a ) and weekends on [email or web address]  Patient seen with NP, I formulated the plan and agree with the above note.   At baseline, he has OHS/OSA. He uses 3-4 L home oxygen but has not had a CPAP machine for about 6 months.  He has HFpEF with difficult to manage volume overload.  Last echo in 9/24 showed EF 55-60% with severe LVH, RV poorly visualized.    He presented to the ER 2/3 with severe dyspnea and was found to be hypoxemic/hypercarbic.  He was ultimately intubated.  After sedation, he required norepinephrine  initiation and remains on NE at 4 currently.  He now has a PICC, CVP not yet set up.  He has been started on Lasix  80 mg IV bid with excellent response so far, I/Os net negative 4458 overnight. Creatinine stable at 1.66 (near his baseline).   Lactate was 1.4.   General: Sedated on vent.  Neck: Thick, JVP 16+, no thyromegaly or thyroid nodule.  Lungs: Clear to auscultation bilaterally with normal respiratory effort. CV: Nondisplaced PMI.  Heart regular S1/S2, no S3/S4, no murmur.  2+ edema to thighs.  No carotid bruit.  Difficult to palpate pedal pulses.  Abdomen: Soft, nontender, no hepatosplenomegaly, no distention.  Skin: Intact  without lesions or rashes.  Neurologic: Sedated on vent.  Extremities: No clubbing or cyanosis.  HEENT: Normal.   1. Acute on chronic HFpEF: Suspect significant RV dysfunction and likely significant pulmonary hypertension in the setting of OHS/OSA.  Last echo in 9/24 showed EF 55-60% with severe LVH, RV poorly visualized.  He is markedly volume overloaded by exam but diuresing well so far.  - Continue Lasix  80 mg IV bid, replace K as needed.  - Repeat echo.  - Follow CVP on PICC line.  2. Hypotension/shock: Suspect this is due to combination of RV failure and sedation with intubation.  Currently on NE 4.  - Wean NE for MAP 65.  Hopefully can stop this soon.  3. Acute hypoxemic/hypercarbic respiratory failure: In setting of OHS/OSA (on home oxygen 3-4 L, has not had CPAP for 6 months) and CHF/volume overload.  Now intubated, respiratory mechanics improving with diuresis.  - Continue Lasix  80 mg IV bid.  - Vent per CCM.  - Will need to get back on CPAP at home.  4. CKD stage 3: Creatinine 1.66, this is near his baseline (1.4-1.6).  Follow with diuresis.  5. Polycythemia: Likely due to chronic hypoxemia.  6. Elevated troponin: Mild elevation, no trend.  Had cath in 2022 with minimal nonobstructive CAD.  - Continue atorvastatin .  7. Atrial fibrillation: Paroxysmal.  He is in NSR today.  On Xarelto  at home.  - Continue heparin  gtt while NPO.  8. DM2: SSI.   CRITICAL CARE Performed by: Ezra Shuck  Total critical care time: 70 minutes  Critical care time was exclusive of separately billable procedures and treating other patients.  Critical care was necessary to treat or prevent imminent or life-threatening deterioration.  Critical care was time spent personally by me on the following activities: development of treatment plan with patient and/or surrogate as well as nursing, discussions with consultants, evaluation of patient's response to treatment, examination of patient, obtaining  history from patient or surrogate, ordering and performing treatments and interventions, ordering and review of laboratory studies, ordering and review of radiographic studies, pulse oximetry and re-evaluation of patient's condition.  Ezra Shuck 03/21/2023 3:33 PM

## 2023-03-21 NOTE — Progress Notes (Signed)
Per night shift report unable to place OG tube due to bleeding and also unable to place central line.

## 2023-03-21 NOTE — Progress Notes (Signed)
 PT Cancellation Note  Patient Details Name: Shalin Vonbargen MRN: [id number] DOB: 19-Aug-1975   Cancelled Treatment:    Reason Eval/Treat Not Completed: Other (comment). Pt with change in status, now intubated and sedated. Will complete current order at this time. Please re-order when medically stable.   Vermell Madrid 03/21/2023, 9:01 AM Corean Dade, PT, DPT, GCS [phone]

## 2023-03-21 NOTE — Progress Notes (Signed)
*  PRELIMINARY RESULTS* Echocardiogram 2D Echocardiogram has been performed.  Carolyne Fiscal 03/21/2023, 4:19 PM

## 2023-03-21 NOTE — Progress Notes (Signed)
 eLink Physician-Brief Progress Note Patient Name: Bostyn Bogie DOB: Mar 24, 1975 MRN: [id number]   Date of Service  03/21/2023  HPI/Events of Note  48 year old male with a history of morbid obesity, diabetes, sleep apnea, and known heart failure who is on chronic oxygen therapy at home.  Patient was brought into the emergency department with hypoxemia, transition to BiPAP but the patient intermittently refused BiPAP and eventually became obtunded.  Patient was intubated and referred to ICU for further management.  On examination, patient is tachypneic, saturating 99% on 100% FiO2.  Mechanically ventilated.  On low-dose Precedex  and fentanyl .  Results show severe hypercapnia with acidemia.  Creatinine elevation, elevated BNP, polycythemia, and elevated INR.  Chest radiograph pending.  eICU Interventions  Maintain mechanical ventilation, daily spontaneous awakening and breathing trials.  Consider discontinuation of beta-blockade in the setting of no acute heart failure exacerbation.  Maintain aggressive diuresis  Maintain scheduled SVNs and steroids  DVT prophylaxis with rivaroxaban  Famotidine  for GI prophylaxis famotidine      Intervention Category Evaluation Type: New Patient Evaluation  Detrick Dani 03/21/2023, 12:38 AM

## 2023-03-21 NOTE — Inpatient Diabetes Management (Signed)
Inpatient Diabetes Program Recommendations  AACE/ADA: New Consensus Statement on Inpatient Glycemic Control (2015)  Target Ranges:  Prepandial:   less than 140 mg/dL      Peak postprandial:   less than 180 mg/dL (1-2 hours)      Critically ill patients:  140 - 180 mg/dL   Lab Results  Component Value Date   GLUCAP 111 (H) 03/21/2023   HGBA1C 7.0 (H) 09/28/2022    Latest Reference Range & Units 03/21/23 03:35 03/21/23 07:16 03/21/23 11:08 03/21/23 11:09  Glucose-Capillary 70 - 99 mg/dL 951 (H) Novolog 3 units 115 (H) 67 (L) 111 (H)  (H): Data is abnormally high (L): Data is abnormally low   Diabetes history: DM2 Outpatient Diabetes medications: Jardiance 10 mg daily Current orders for Inpatient glycemic control: Novolog 0-20 units q 4 hrs.  Inpatient Diabetes Program Recommendations:   Patient had hypoglycemia 67 CBG post Novolog correction Please consider: -Decrease Novolog correction to 0-6 units q 4 hrs.  Thank you, Billy Fischer. Avel Ogawa, RN, MSN, CDCES  Diabetes Coordinator Inpatient Glycemic Control Team Team Pager [phone] (8am-5pm) 03/21/2023 1:16 PM

## 2023-03-21 NOTE — Progress Notes (Signed)
 NAME:  Gregory Mckenzie, MRN:  [id number], DOB:  1975-09-09, LOS: 1 ADMISSION DATE:  03/20/2023, CHIEF COMPLAINT:  Respiratory Failure   History of Present Illness:   48 y.o morbidly obese male with significant PMH of chronic combined systolic and diastolic CHF, atrial fibrillation/flutter, T2DM, HLD, HTN, OSA not on CPAP, obesity hypoventilation syndrome, former smoker, and polycythemia who presented to the ED from the heart failure clinic with chief complaints of acute respiratory distress.   Per ED reports patient was seen in heart failure clinic for evaluation of shortness of breath with very little exertion, fatigue, worsening abdominal distention, cough, worsening pedal edema and weight gain.  He ran out of his oxygen last night around 9 PM.  When he was noted with sats in the lower 60s on room air she was placed on 3L which was increased to 5 L with only improvement up to 83%.  Patient was sent to the ED for further evaluation   ED Course: Initial vital signs showed HR of 86 beats/minute, BP 153/130 mm Hg, the RR 20 breaths/minute, and the oxygen saturation 84% on 5L and a temperature of 98.91F (37.1C).  Pertinent Labs/Diagnostics Findings: Na+/ K+: 140/4.8.  Glucose: 147.  BUN/Cr.:  44/2.19  WBC: 4.2 K/L COVID PCR: Negative,  troponin: 122~152~121  BNP:1596.6   VBG: pO2 47; pCO2 102; pH 7.19;  HCO3 39.0, %O2 Sat 58.6.  CXR> Cardiomegaly, vascular congestion  Medication administered in the ED: Disposition: Patient was given IV Lasix , placed on BiPAP and admitted to hospitalist service.  He however continued declined to wear his BiPAP.  He was given sedation with no improvement.  While still holding in the ED he was noted to be more lethargic and and less responsive therefore he was intubated for airway protection.  PCCM consulted  Pertinent  Medical History  chronic combined systolic and diastolic CHF, atrial fibrillation/flutter, T2DM, HLD, HTN, OSA not on CPAP, obesity  hypoventilation syndrome, former smoker, and polycythemia     Significant Hospital Events: Including procedures, antibiotic start and stop dates in addition to other pertinent events   2/3: Admitted to hospitalist service with acute on chronic hypoxic hypercapnic respiratory failure in the setting of CHF exacerbation. Failed BiPAP requiring intubation.  PCCM consulted 2/4: remains intubated, on diuresis   Objective   Blood pressure 113/64, pulse 63, temperature 98 F (36.7 C), temperature source Oral, resp. rate (!) 24, weight (!) 155.6 kg, SpO2 97%. CVP:  [15 mmHg] 15 mmHg  Vent Mode: PRVC FiO2 (%):  [40 %-100 %] 40 % Set Rate:  [24 bmp-27 bmp] 24 bmp Vt Set:  [500 mL-550 mL] 500 mL PEEP:  [8 cmH20-16 cmH20] 14 cmH20 Plateau Pressure:  [31 cmH20-41 cmH20] 41 cmH20   Intake/Output Summary (Last 24 hours) at 03/21/2023 1539 Last data filed at 03/21/2023 1331 Gross per 24 hour  Intake 1026.35 ml  Output 7800 ml  Net -6773.65 ml   Filed Weights   03/21/23 0227  Weight: (!) 155.6 kg    Examination: Physical Exam Constitutional:      Appearance: He is obese. He is ill-appearing.  Cardiovascular:     Rate and Rhythm: Normal rate and regular rhythm.     Pulses: Normal pulses.     Heart sounds: Normal heart sounds.  Pulmonary:     Effort: Pulmonary effort is normal.     Breath sounds: Normal breath sounds. No wheezing.  Musculoskeletal:     Right lower leg: Edema present.     Left  lower leg: Edema present.  Neurological:     Mental Status: He is disoriented.     Assessment & Plan:   #Acute on Chronic Hypoxic and Hypercapnic Respiratory Failure #Acute on Chronic Decompensated HFpEF #Pulmonary Edema #OHS  OSA #AKI on CKD #Toxic Metabolic Encephalopathy  CO2 Narcosis #T2DM  Neuro - encephalopathy secondary to CO2 narcosis. Intubated for respiratory failure and encephalopathy, now on propofol  and fentanyl  for analgosedation, goal RASS -1 CV - decompensated heart  failure with significant volume overload. Initiated on aggressive IV diuresis. PICC line placed, will trend central venous saturation. Appreciate input from cardiology. Pulm - hypoxic and hypercapnic respiratory failure secondary to a combination of decompensated heart failure and OHS. Improved hypercapnia and respiratory acidosis with mechanical ventilation. Optimized PEEP and weaned down FiO2. Improving with diuresis. Was in the process of getting evaluation for complex sleep disorder prior to admission Renal - initiated on furosemide  for diuresis. AKI potentially cardiorenal on top of CKD. Will closely monitor electrolytes Endo - ICU glycemic protocol Hem/Onc - heparin  gtt for afib, on rivaroxaban  at home GI - NPO for now, will initiate tube feeds tomorrow. PPI for SUP ID - monitor fever curve. D/c antibiotics  Best Practice (right click and Reselect all SmartList Selections daily)   Diet/type: NPO DVT prophylaxis systemic heparin  Pressure ulcer(s): N/A GI prophylaxis: PPI Lines: Central line and yes and it is still needed Foley:  Yes, and it is still needed Code Status:  full code Last date of multidisciplinary goals of care discussion [03/21/2023]  Labs   CBC: Recent Labs  Lab 03/20/23 1046 03/21/23 0246  WBC 4.2 3.9*  HGB 18.9* 19.1*  HCT 61.2* 59.9*  MCV 93.7 91.7  PLT 175 193    Basic Metabolic Panel: Recent Labs  Lab 03/20/23 1046 03/21/23 0246 03/21/23 0251 03/21/23 1351  NA 140 142  --  142  K 4.8 4.2  --  4.4  CL 96* 98  --  97*  CO2 32 26  --  30  GLUCOSE 147* 125*  --  130*  BUN 44* 42*  --  37*  CREATININE 2.19* 1.73*  --  1.66*  CALCIUM  8.9 8.9  --  8.8*  MG  --   --  2.2  --    GFR: Estimated Creatinine Clearance: 76.5 mL/min (A) (by C-G formula based on SCr of 1.66 mg/dL (H)). Recent Labs  Lab 03/20/23 1046 03/21/23 0246 03/21/23 0636  WBC 4.2 3.9*  --   LATICACIDVEN  --  1.4 1.4    Liver Function Tests: Recent Labs  Lab 03/21/23 0246   AST 20  ALT 30  ALKPHOS 165*  BILITOT 2.5*  PROT 6.6  ALBUMIN 3.3*   No results for input(s): LIPASE, AMYLASE in the last 168 hours. No results for input(s): AMMONIA in the last 168 hours.  ABG    Component Value Date/Time   PHART 7.33 (L) 05/24/2021 1516   PCO2ART 62 (H) 05/24/2021 1516   PO2ART 71 (L) 05/24/2021 1516   HCO3 34.3 (H) 03/21/2023 0246   TCO2 31 05/08/2020 1324   TCO2 32 05/08/2020 1324   O2SAT 85.1 03/21/2023 0246     Coagulation Profile: Recent Labs  Lab 03/20/23 1046  INR 2.0*    Cardiac Enzymes: No results for input(s): CKTOTAL, CKMB, CKMBINDEX, TROPONINI in the last 168 hours.  HbA1C: Hgb A1c MFr Bld  Date/Time Value Ref Range Status  09/28/2022 11:34 AM 7.0 (H) 4.8 - 5.6 % Final    Comment:  Prediabetes: 5.7 - 6.4          Diabetes: >6.4          Glycemic control for adults with diabetes: <7.0   12/21/2021 11:08 AM 6.6 (H) 4.8 - 5.6 % Final    Comment:    (NOTE) Pre diabetes:          5.7%-6.4%  Diabetes:              >6.4%  Glycemic control for   <7.0% adults with diabetes     CBG: Recent Labs  Lab 03/20/23 2326 03/21/23 0335 03/21/23 0716 03/21/23 1108 03/21/23 1109  GLUCAP 131* 126* 115* 67* 111*    Review of Systems:   N/A  Past Medical History:  He,  has a past medical history of Atrial flutter (HCC), CAD (coronary artery disease), Chronic combined systolic and diastolic CHF (congestive heart failure) (HCC), Diabetes mellitus without complication (HCC), Hyperlipidemia, Hypertension, NICM (nonischemic cardiomyopathy) (HCC), Nonadherence to medication, Obesity, and Sleep apnea, obstructive.   Surgical History:   Past Surgical History:  Procedure Laterality Date   RIGHT/LEFT HEART CATH AND CORONARY ANGIOGRAPHY N/A 05/08/2020   Procedure: RIGHT/LEFT HEART CATH AND CORONARY ANGIOGRAPHY;  Surgeon: Rolan Ezra RAMAN, MD;  Location: [facility] INVASIVE CV LAB;  Service: Cardiovascular;  Laterality: N/A;      Social History:   reports that he quit smoking about 2 years ago. His smoking use included cigarettes. He started smoking about 29 years ago. He has a 27 pack-year smoking history. He has never used smokeless tobacco. He reports that he does not currently use alcohol . He reports current drug use. Frequency: 7.00 times per week. Drug: Marijuana.   Family History:  His family history includes Hypertension in his brother and father; Pulmonary embolism in his brother.   Allergies No Known Allergies   Home Medications  Prior to Admission medications   Medication Sig Start Date End Date Taking? Authorizing Provider  atorvastatin  (LIPITOR ) 40 MG tablet Take 2 tablets (80 mg total) by mouth at bedtime. 03/16/23  Yes Donette Ellouise LABOR, FNP  carvedilol  (COREG ) 6.25 MG tablet Take 1 tablet (6.25 mg total) by mouth 2 (two) times daily with a meal. 09/20/22  Yes Donette Ellouise LABOR, FNP  empagliflozin  (JARDIANCE ) 10 MG TABS tablet Take 1 tablet (10 mg total) by mouth daily before breakfast. 09/21/22  Yes Donette Ellouise A, FNP  rivaroxaban  (XARELTO ) 20 MG TABS tablet Take 1 tablet (20 mg total) by mouth daily with supper. 09/20/22  Yes Hackney, Ellouise A, FNP  sacubitril -valsartan  (ENTRESTO ) 49-51 MG Take 1 tablet by mouth 2 (two) times daily. 03/07/23  Yes Hackney, Ellouise A, FNP  spironolactone  (ALDACTONE ) 25 MG tablet Take 1 tablet (25 mg total) by mouth once daily. 09/20/22  Yes Hackney, Ellouise A, FNP  torsemide  (DEMADEX ) 20 MG tablet Take 2 tablets (40 mg total) by mouth daily. 03/07/23  Yes Donette Ellouise A, FNP  empagliflozin  (JARDIANCE ) 10 MG TABS tablet Take 1 tablet (10 mg total) by mouth daily before breakfast. Patient not taking: Reported on 03/20/2023 03/07/23   Donette Ellouise A, FNP  OXYGEN Inhale 4 L into the lungs at bedtime.    [provider]     Critical care time: 43 minutes    Belva November, MD  Pulmonary Critical Care 03/21/2023 3:57 PM

## 2023-03-21 NOTE — Progress Notes (Signed)
 ANTICOAGULATION CONSULT NOTE  Pharmacy Consult for heparin  infusion Indication: atrial fibrillation  No Known Allergies  Patient Measurements: Weight: (!) 155.6 kg (343 lb 0.6 oz) Heparin  Dosing Weight: 101.6 kg  Vital Signs: Temp: 98.6 F (37 C) (02/04 2000) Temp Source: Axillary (02/04 2000) BP: 94/56 (02/04 2100) Pulse Rate: 65 (02/04 2100)  Labs: Recent Labs    03/20/23 1046 03/20/23 1513 03/20/23 1844 03/21/23 0246 03/21/23 0636 03/21/23 1351 03/21/23 2123  HGB 18.9*  --   --  19.1*  --   --   --   HCT 61.2*  --   --  59.9*  --   --   --   PLT 175  --   --  193  --   --   --   APTT  --   --   --   --  36  --  52*  LABPROT 23.3*  --   --   --   --   --   --   INR 2.0*  --   --   --   --   --   --   HEPARINUNFRC  --   --   --   --  >1.10*  --   --   CREATININE 2.19*  --   --  1.73*  --  1.66*  --   TROPONINIHS 122* 152* 121*  --   --   --   --     Estimated Creatinine Clearance: 76.5 mL/min (A) (by C-G formula based on SCr of 1.66 mg/dL (H)).   Medical History: Past Medical History:  Diagnosis Date   Atrial flutter (HCC)    a. CHA2DS2VASc = 4-->xarelto .   CAD (coronary artery disease)    a. 04/2020 Cath: LM nl, LAD 30ost/p, LCX nl, RCA 30p/m.   Chronic combined systolic and diastolic CHF (congestive heart failure) (HCC)    a. 03/2020 Echo: EF 40-45%; b. 05/2021 Echo: EF 40-45%; c. 10/2022 Echo: EF 55-60%, no rwma, sev LVH, RVSP 23.1 mmHg.   Diabetes mellitus without complication (HCC)    Hyperlipidemia    Hypertension    NICM (nonischemic cardiomyopathy) (HCC)    Nonadherence to medication    Obesity    Sleep apnea, obstructive     Medications:  PTA Meds: Rivaroxaban  20 mg qpm, last dose 03/20/23 @ 1637  Assessment: Pt is a 48 yo male with h/o A. Fib, on Xarelto , admitted for CHF exacerbation, now found with elevated troponins likely demand ischemia, being transitioned to heparin  infusion. Cardiology consulted.  Goal of Therapy:  Heparin  level 0.3-0.7  units/ml aPTT 66-102 seconds Monitor platelets by anticoagulation protocol: Yes  02/04 2123 aPTT 52, subtherapeutic   Plan:  Bolus 3000 units x 1 Increase heparin  infusion to 1800 units/hr Will follow aPTT until correlation w/ HL confirmed Will check aPTT w/ AM labs after rate change HL & CBC daily while on heparin   Rankin CANDIE Dills, PharmD, [facility] 03/21/2023 9:54 PM

## 2023-03-21 NOTE — Telephone Encounter (Signed)
Pt currently admitted to the hospital and is under PCCM service

## 2023-03-22 ENCOUNTER — Inpatient Hospital Stay: Payer: Medicaid Other

## 2023-03-22 ENCOUNTER — Other Ambulatory Visit: Payer: Self-pay

## 2023-03-22 DIAGNOSIS — J9621 Acute and chronic respiratory failure with hypoxia: Secondary | ICD-10-CM | POA: Diagnosis not present

## 2023-03-22 DIAGNOSIS — E118 Type 2 diabetes mellitus with unspecified complications: Secondary | ICD-10-CM

## 2023-03-22 DIAGNOSIS — J9622 Acute and chronic respiratory failure with hypercapnia: Secondary | ICD-10-CM | POA: Diagnosis not present

## 2023-03-22 DIAGNOSIS — I5043 Acute on chronic combined systolic (congestive) and diastolic (congestive) heart failure: Secondary | ICD-10-CM

## 2023-03-22 DIAGNOSIS — J811 Chronic pulmonary edema: Secondary | ICD-10-CM | POA: Diagnosis not present

## 2023-03-22 DIAGNOSIS — I5033 Acute on chronic diastolic (congestive) heart failure: Secondary | ICD-10-CM | POA: Diagnosis not present

## 2023-03-22 DIAGNOSIS — G4733 Obstructive sleep apnea (adult) (pediatric): Secondary | ICD-10-CM

## 2023-03-22 LAB — POTASSIUM
Potassium: 3.1 mmol/L — ABNORMAL LOW (ref 3.5–5.1)
Potassium: 3.6 mmol/L (ref 3.5–5.1)
Potassium: 3.7 mmol/L (ref 3.5–5.1)
Potassium: 4 mmol/L (ref 3.5–5.1)
Potassium: 6.4 mmol/L (ref 3.5–5.1)

## 2023-03-22 LAB — RENAL FUNCTION PANEL
Albumin: 2.6 g/dL — ABNORMAL LOW (ref 3.5–5.0)
Anion gap: 11 (ref 5–15)
BUN: 32 mg/dL — ABNORMAL HIGH (ref 6–20)
CO2: 33 mmol/L — ABNORMAL HIGH (ref 22–32)
Calcium: 8.9 mg/dL (ref 8.9–10.3)
Chloride: 98 mmol/L (ref 98–111)
Creatinine, Ser: 1.66 mg/dL — ABNORMAL HIGH (ref 0.61–1.24)
GFR, Estimated: 50 mL/min — ABNORMAL LOW (ref >60)
Glucose, Bld: 113 mg/dL — ABNORMAL HIGH (ref 70–99)
Phosphorus: 2.8 mg/dL (ref 2.5–4.6)
Potassium: 3.9 mmol/L (ref 3.5–5.1)
Sodium: 142 mmol/L (ref 135–145)

## 2023-03-22 LAB — GLUCOSE, CAPILLARY
Glucose-Capillary: 119 mg/dL — ABNORMAL HIGH (ref 70–99)
Glucose-Capillary: 117 mg/dL — ABNORMAL HIGH (ref 70–99)
Glucose-Capillary: 123 mg/dL — ABNORMAL HIGH (ref 70–99)
Glucose-Capillary: 133 mg/dL — ABNORMAL HIGH (ref 70–99)
Glucose-Capillary: 115 mg/dL — ABNORMAL HIGH (ref 70–99)
Glucose-Capillary: 100 mg/dL — ABNORMAL HIGH (ref 70–99)

## 2023-03-22 LAB — TRIGLYCERIDES: Triglycerides: 63 mg/dL (ref <150)

## 2023-03-22 LAB — CBC
HCT: 55.6 % — ABNORMAL HIGH (ref 39.0–52.0)
Hemoglobin: 18.1 g/dL — ABNORMAL HIGH (ref 13.0–17.0)
MCH: 28.8 pg (ref 26.0–34.0)
MCHC: 32.6 g/dL (ref 30.0–36.0)
MCV: 88.4 fL (ref 80.0–100.0)
Platelets: 187 10*3/uL (ref 150–400)
RBC: 6.29 MIL/uL — ABNORMAL HIGH (ref 4.22–5.81)
RDW: 20.7 % — ABNORMAL HIGH (ref 11.5–15.5)
WBC: 7.1 10*3/uL (ref 4.0–10.5)
nRBC: 1.8 % — ABNORMAL HIGH (ref 0.0–0.2)

## 2023-03-22 LAB — APTT
aPTT: 80 s — ABNORMAL HIGH (ref 24–36)
aPTT: 78 s — ABNORMAL HIGH (ref 24–36)

## 2023-03-22 LAB — COOXEMETRY PANEL
Carboxyhemoglobin: 2.3 % — ABNORMAL HIGH (ref 0.5–1.5)
Methemoglobin: 1 % (ref 0.0–1.5)
O2 Saturation: 89.5 %
Total hemoglobin: 18.4 g/dL — ABNORMAL HIGH (ref 12.0–16.0)
Total oxygen content: 86.6 %

## 2023-03-22 LAB — HEPARIN LEVEL (UNFRACTIONATED): Heparin Unfractionated: >1.1 [IU]/mL — ABNORMAL HIGH (ref 0.30–0.70)

## 2023-03-22 LAB — MAGNESIUM
Magnesium: 2 mg/dL (ref 1.7–2.4)
Magnesium: 2.1 mg/dL (ref 1.7–2.4)

## 2023-03-22 LAB — LACTIC ACID, PLASMA: Lactic Acid, Venous: 1.7 mmol/L (ref 0.5–1.9)

## 2023-03-22 LAB — PHOSPHORUS: Phosphorus: 2.8 mg/dL (ref 2.5–4.6)

## 2023-03-22 MED ORDER — POLYETHYLENE GLYCOL 3350 17 G PO PACK
17.0000 g | PACK | Freq: Every day | ORAL | Status: DC
  Administered 2023-03-23 – 2023-03-26 (×4): 17 g via ORAL
  Filled 2023-03-22 (×4): qty 1

## 2023-03-22 MED ORDER — PROSOURCE TF20 ENFIT COMPATIBL EN LIQD
60.0000 mL | Freq: Every day | ENTERAL | Status: DC
  Administered 2023-03-22 – 2023-03-27 (×6): 60 mL
  Filled 2023-03-22 (×7): qty 60

## 2023-03-22 MED ORDER — FREE WATER
30.0000 mL | Status: DC
Start: 2023-03-22 — End: 2023-03-28
  Administered 2023-03-22 – 2023-03-28 (×34): 30 mL

## 2023-03-22 MED ORDER — CEFTRIAXONE SODIUM 2 G IJ SOLR
2.0000 g | INTRAMUSCULAR | Status: AC
  Administered 2023-03-22 – 2023-03-26 (×5): 2 g via INTRAVENOUS
  Filled 2023-03-22 (×5): qty 20

## 2023-03-22 MED ORDER — SODIUM ZIRCONIUM CYCLOSILICATE 5 G PO PACK
10.0000 g | PACK | ORAL | Status: AC
  Administered 2023-03-22: 10 g
  Filled 2023-03-22: qty 2

## 2023-03-22 MED ORDER — MIDAZOLAM HCL 2 MG/2ML IJ SOLN: 2.0000 mg | Freq: Once | INTRAMUSCULAR | Status: AC

## 2023-03-22 MED ORDER — INSULIN ASPART 100 UNIT/ML IV SOLN
10.0000 [IU] | Freq: Once | INTRAVENOUS | Status: AC
  Administered 2023-03-22: 10 [IU] via INTRAVENOUS
  Filled 2023-03-22: qty 0.1

## 2023-03-22 MED ORDER — POTASSIUM CHLORIDE 10 MEQ/50ML IV SOLN
10.0000 meq | INTRAVENOUS | Status: AC
  Administered 2023-03-22 (×3): 10 meq via INTRAVENOUS
  Filled 2023-03-22 (×3): qty 50

## 2023-03-22 MED ORDER — VITAL HIGH PROTEIN PO LIQD
1000.0000 mL | ORAL | Status: DC
  Administered 2023-03-22 – 2023-03-23 (×4): 1000 mL

## 2023-03-22 MED ORDER — MIDAZOLAM HCL 2 MG/2ML IJ SOLN
INTRAMUSCULAR | Status: AC
  Administered 2023-03-22: 2 mg via INTRAVENOUS
  Filled 2023-03-22: qty 2

## 2023-03-22 MED ORDER — DEXTROSE 50 % IV SOLN
1.0000 | Freq: Once | INTRAVENOUS | Status: AC
  Administered 2023-03-22: 50 mL via INTRAVENOUS
  Filled 2023-03-22: qty 50

## 2023-03-22 NOTE — Progress Notes (Signed)
 Heart Failure Navigator Progress Note  Assessed for Heart & Vascular TOC clinic readiness.  Patient does not meet criteria due to current Advanced Heart Failure patient of Dr. Ezra Shuck, MD.   Navigator will sign off at this time.  Charmaine Pines, RN, BSN [facility] Heart Failure Navigator Secure Chat Only

## 2023-03-22 NOTE — Progress Notes (Addendum)
 Versed  2mg  IV given for OG placement.  08:50 18 Fr orogastric tube placed per policy and procedure without complication. KUB pending for placement verification.

## 2023-03-22 NOTE — Progress Notes (Signed)
 Pt HR sustaining in the 140's. MD notified. Waiting on repeat electrolyte results. Pt asymptomatic.

## 2023-03-22 NOTE — Progress Notes (Signed)
 ANTICOAGULATION CONSULT NOTE  Pharmacy Consult for heparin  infusion Indication: atrial fibrillation  No Known Allergies  Patient Measurements: Weight: (!) 181 kg (399 lb) Heparin  Dosing Weight: 101.6 kg  Vital Signs: Temp: 98.9 F (37.2 C) (02/05 0400) Temp Source: Axillary (02/05 0000) BP: 103/68 (02/05 0600) Pulse Rate: 71 (02/05 0600)  Labs: Recent Labs    03/20/23 1046 03/20/23 1513 03/20/23 1844 03/21/23 0246 03/21/23 0636 03/21/23 1351 03/21/23 2123 03/22/23 0419  HGB 18.9*  --   --  19.1*  --   --   --  18.1*  HCT 61.2*  --   --  59.9*  --   --   --  55.6*  PLT 175  --   --  193  --   --   --  187  APTT  --   --   --   --  36  --  52* 80*  LABPROT 23.3*  --   --   --   --   --   --   --   INR 2.0*  --   --   --   --   --   --   --   HEPARINUNFRC  --   --   --   --  >1.10*  --   --  >1.10*  CREATININE 2.19*  --   --  1.73*  --  1.66*  --  1.66*  TROPONINIHS 122* 152* 121*  --   --   --   --   --     Estimated Creatinine Clearance: 84.3 mL/min (A) (by C-G formula based on SCr of 1.66 mg/dL (H)).   Medical History: Past Medical History:  Diagnosis Date   Atrial flutter (HCC)    a. CHA2DS2VASc = 4-->xarelto .   CAD (coronary artery disease)    a. 04/2020 Cath: LM nl, LAD 30ost/p, LCX nl, RCA 30p/m.   Chronic combined systolic and diastolic CHF (congestive heart failure) (HCC)    a. 03/2020 Echo: EF 40-45%; b. 05/2021 Echo: EF 40-45%; c. 10/2022 Echo: EF 55-60%, no rwma, sev LVH, RVSP 23.1 mmHg.   Diabetes mellitus without complication (HCC)    Hyperlipidemia    Hypertension    NICM (nonischemic cardiomyopathy) (HCC)    Nonadherence to medication    Obesity    Sleep apnea, obstructive     Medications:  PTA Meds: Rivaroxaban  20 mg qpm, last dose 03/20/23 @ 1637  Assessment: Pt is a 48 yo male with h/o A. Fib, on Xarelto , admitted for CHF exacerbation, now found with elevated troponins likely demand ischemia, being transitioned to heparin  infusion.  Cardiology consulted.  Goal of Therapy:  Heparin  level 0.3-0.7 units/ml aPTT 66-102 seconds Monitor platelets by anticoagulation protocol: Yes  02/04 2123 aPTT 52, subtherapeutic 02/05 0419 aPTT 80, therapeutic x 1 / HL >1.1, not correlating   Plan:  Continue heparin  infusion at 1800 units/hr Will follow aPTT until correlation w/ HL confirmed Will recheck aPTT in 6 hrs to confrim HL & CBC daily while on heparin   Rankin CANDIE Dills, PharmD, [facility] 03/22/2023 6:25 AM

## 2023-03-22 NOTE — Progress Notes (Signed)
 Levophed  drip turned off due to hypertension

## 2023-03-22 NOTE — Plan of Care (Signed)
  Problem: Fluid Volume: Goal: Ability to maintain a balanced intake and output will improve Outcome: Progressing   Problem: Metabolic: Goal: Ability to maintain appropriate glucose levels will improve Outcome: Progressing   Problem: Nutritional: Goal: Maintenance of adequate nutrition will improve Outcome: Not Progressing   Problem: Skin Integrity: Goal: Risk for impaired skin integrity will decrease Outcome: Progressing   Problem: Clinical Measurements: Goal: Ability to maintain clinical measurements within normal limits will improve Outcome: Progressing Goal: Will remain free from infection Outcome: Progressing Goal: Diagnostic test results will improve Outcome: Progressing

## 2023-03-22 NOTE — Progress Notes (Signed)
 ANTICOAGULATION CONSULT NOTE  Pharmacy Consult for Heparin  Infusion Indication: atrial fibrillation  Patient Measurements: Weight: (!) 181 kg (399 lb) Heparin  Dosing Weight: 101.6 kg  Labs: Recent Labs    03/20/23 1046 03/20/23 1513 03/20/23 1844 03/21/23 0246 03/21/23 0636 03/21/23 0636 03/21/23 1351 03/21/23 2123 03/22/23 0419 03/22/23 1227  HGB 18.9*  --   --  19.1*  --   --   --   --  18.1*  --   HCT 61.2*  --   --  59.9*  --   --   --   --  55.6*  --   PLT 175  --   --  193  --   --   --   --  187  --   APTT  --   --   --   --  36   < >  --  52* 80* 78*  LABPROT 23.3*  --   --   --   --   --   --   --   --   --   INR 2.0*  --   --   --   --   --   --   --   --   --   HEPARINUNFRC  --   --   --   --  >1.10*  --   --   --  >1.10*  --   CREATININE 2.19*  --   --  1.73*  --   --  1.66*  --  1.66*  --   TROPONINIHS 122* 152* 121*  --   --   --   --   --   --   --    < > = values in this interval not displayed.    Estimated Creatinine Clearance: 84.3 mL/min (A) (by C-G formula based on SCr of 1.66 mg/dL (H)).   Medical History: Past Medical History:  Diagnosis Date   Atrial flutter (HCC)    a. CHA2DS2VASc = 4-->xarelto .   CAD (coronary artery disease)    a. 04/2020 Cath: LM nl, LAD 30ost/p, LCX nl, RCA 30p/m.   Chronic combined systolic and diastolic CHF (congestive heart failure) (HCC)    a. 03/2020 Echo: EF 40-45%; b. 05/2021 Echo: EF 40-45%; c. 10/2022 Echo: EF 55-60%, no rwma, sev LVH, RVSP 23.1 mmHg.   Diabetes mellitus without complication (HCC)    Hyperlipidemia    Hypertension    NICM (nonischemic cardiomyopathy) (HCC)    Nonadherence to medication    Obesity    Sleep apnea, obstructive     Medications:  PTA Meds: Rivaroxaban  20 mg qpm, last dose 03/20/23 @ 1637  Assessment: Pt is a 48 yo male with h/o A. Fib, on Xarelto , admitted for CHF exacerbation, now found with elevated troponins likely demand ischemia, being transitioned to heparin  infusion.  Cardiology consulted.  Goal of Therapy:  Heparin  level 0.3-0.7 units/ml aPTT 66-102 seconds Monitor platelets by anticoagulation protocol: Yes  0204 2123 aPTT 52, subtherapeutic 0205 0419 aPTT 80, therapeutic x 1 / HL >1.1, not correlating 0205 1227 aPTT 78, therapeutic x 2   Plan:  --Continue heparin  infusion at 1800 units/hr --aPTT, HL, CBC tomorrow AM --Will follow aPTT until correlation w/ HL confirmed  Gregory Mckenzie 03/22/2023 1:20 PM

## 2023-03-22 NOTE — Progress Notes (Signed)
 Nutrition Follow-up  DOCUMENTATION CODES:   Morbid obesity  INTERVENTION:   -TF via OGT:   Initiate Vital High Protein @ 20 ml/hr and increase by 10 ml every 8 hours to goal rate of 60 ml/hr.   60 ml Prosource TF daily  30 ml free water  flush every 4 hours   Tube feeding regimen provides 1520 kcal (100% of needs), 146 grams of protein, and 1203 ml of H2O. Total free water : 1384 ml daily  NUTRITION DIAGNOSIS:   Inadequate oral intake related to inability to eat (pt sedated and ventilated) as evidenced by NPO status.  Ongoing  GOAL:   Provide needs based on ASPEN/SCCM guidelines  Progressing   MONITOR:   Vent status, Labs, Weight trends, TF tolerance, I & O's, Skin  REASON FOR ASSESSMENT:   Consult Enteral/tube feeding initiation and management  ASSESSMENT:   48 y.o. male with medical history significant of DM, hyperlipidemia, sleep apnea (CPAP broken), HTN, paroxysmal atrial flutter, morbid obesity, polycythemia, CAD, CKD III, marijuana use, possible obesity hypoventilation syndrome, previous tobacco use and chronic HFpEF who is admitted with acute respiratory failure with hypoxia, acute on chronic HFpEF, NSTEMI and AKI.  Patient is currently intubated on ventilator support. Pt with OGT; KUB today reveals tip of tube in stomach.  MV: 11.9 L/min Temp (24hrs), Avg:98.8 F (37.1 C), Min:98 F (36.7 C), Max:100.1 F (37.8 C)  Propofol : 32.7 ml/hr (provided 863 kcals daily)  Reviewed I/O's: -6.1 L x 24 hours and 10.5 L since admission  UOP: 8 L x 24 hours  OGT placed today by RN. RD consulted to start feeds. Oxygenation has improved secondary to aggressive diuresis.   Medications reviewed and include colace, lasix , protonix , miralax , heparin , levophed , and propofol .   Labs reviewed: CBGS: 117-123 (inpatient orders for glycemic control are 2-6 units insulin  aspart every 4 hours).    Diet Order:   Diet Order     None       EDUCATION NEEDS:   Not  appropriate for education at this time  Skin:  Skin Assessment: Reviewed RN Assessment  Last BM:  PTA  Height:   Ht Readings from Last 1 Encounters:  03/13/23 5' 6 (1.676 m)    Weight:   Wt Readings from Last 1 Encounters:  03/22/23 (!) 181 kg    Ideal Body Weight:  64.5 kg  BMI:  Body mass index is 64.4 kg/m.  Estimated Nutritional Needs:   Kcal:  1420-1613kcal/day  Protein:  140-160g/day  Fluid:  1.7-1.9L/day    Margery ORN, RD, LDN, CDCES Registered Dietitian III Certified Diabetes Care and Education Specialist If unable to reach this RD, please use RD Inpatient group chat on secure chat between hours of 8am-4 pm daily

## 2023-03-22 NOTE — Progress Notes (Signed)
 NAME:  Gregory Mckenzie, MRN:  [id number], DOB:  [birth year], LOS: 2 ADMISSION DATE:  03/20/2023, CHIEF COMPLAINT:  Respiratory Failure   History of Present Illness:   48 y.o morbidly obese male with significant PMH of chronic combined systolic and diastolic CHF, atrial fibrillation/flutter, T2DM, HLD, HTN, OSA not on CPAP, obesity hypoventilation syndrome, former smoker, and polycythemia who presented to the ED from the heart failure clinic with chief complaints of acute respiratory distress.   Per ED reports patient was seen in heart failure clinic for evaluation of shortness of breath with very little exertion, fatigue, worsening abdominal distention, cough, worsening pedal edema and weight gain.  He ran out of his oxygen last night around 9 PM.  When he was noted with sats in the lower 60s on room air she was placed on 3L which was increased to 5 L with only improvement up to 83%.  Patient was sent to the ED for further evaluation   ED Course: Initial vital signs showed HR of 86 beats/minute, BP 153/130 mm Hg, the RR 20 breaths/minute, and the oxygen saturation 84% on 5L and a temperature of 98.71F (37.1C).  Pertinent Labs/Diagnostics Findings: Na+/ K+: 140/4.8.  Glucose: 147.  BUN/Cr.:  44/2.19  WBC: 4.2 K/L COVID PCR: Negative,  troponin: 122~152~121  BNP:1596.6   VBG: pO2 47; pCO2 102; pH 7.19;  HCO3 39.0, %O2 Sat 58.6.  CXR> Cardiomegaly, vascular congestion  Medication administered in the ED: Disposition: Patient was given IV Lasix , placed on BiPAP and admitted to hospitalist service.  He however continued declined to wear his BiPAP.  He was given sedation with no improvement.  While still holding in the ED he was noted to be more lethargic and and less responsive therefore he was intubated for airway protection.  PCCM consulted  Pertinent  Medical History  chronic combined systolic and diastolic CHF, atrial fibrillation/flutter, T2DM, HLD, HTN, OSA not on CPAP, obesity  hypoventilation syndrome, former smoker, and polycythemia     Significant Hospital Events: Including procedures, antibiotic start and stop dates in addition to other pertinent events   2/3: Admitted to hospitalist service with acute on chronic hypoxic hypercapnic respiratory failure in the setting of CHF exacerbation. Failed BiPAP requiring intubation.  PCCM consulted 2/4: remains intubated, on diuresis, PICC line placed 2/5: aggressively diuresed, improved oxygen requirements  Objective   Blood pressure 103/68, pulse 71, temperature 98.9 F (37.2 C), resp. rate 17, weight (!) 181 kg, SpO2 92%. CVP:  [10 mmHg-18 mmHg] 18 mmHg  Vent Mode: PRVC FiO2 (%):  [40 %-60 %] 40 % Set Rate:  [24 bmp] 24 bmp Vt Set:  [500 mL] 500 mL PEEP:  [14 cmH20-16 cmH20] 14 cmH20 Plateau Pressure:  [27 cmH20-30 cmH20] 28 cmH20   Intake/Output Summary (Last 24 hours) at 03/22/2023 0750 Last data filed at 03/22/2023 0600 Gross per 24 hour  Intake 1768.26 ml  Output 7925 ml  Net -6156.74 ml   Filed Weights   03/21/23 0227 03/22/23 0431  Weight: (!) 155.6 kg (!) 181 kg    Examination: Physical Exam Constitutional:      Appearance: He is obese. He is ill-appearing.  Cardiovascular:     Rate and Rhythm: Normal rate and regular rhythm.     Pulses: Normal pulses.     Heart sounds: Normal heart sounds.  Pulmonary:     Effort: Pulmonary effort is normal.     Breath sounds: Normal breath sounds. No wheezing.  Musculoskeletal:     Right lower  leg: Edema present.     Left lower leg: Edema present.  Neurological:     Mental Status: He is disoriented.     Assessment & Plan:   #Acute on Chronic Hypoxic and Hypercapnic Respiratory Failure #Acute on Chronic Decompensated HFpEF #Pulmonary Edema #OHS  OSA #AKI on CKD #Toxic Metabolic Encephalopathy  CO2 Narcosis #T2DM  Neuro - encephalopathy secondary to CO2 narcosis. Intubated for respiratory failure and encephalopathy, now on propofol  and fentanyl   for analgosedation, goal RASS -1. Follows commands, respiratory failure barrier to extubation. CV - decompensated heart failure with significant volume overload. Initiated on aggressive IV diuresis with brisk response, now -10 L. PICC line placed, will trend central venous saturation via co-ox today. CVP remains elevated. Highly appreciate input from cardiology. Pulm - hypoxic and hypercapnic respiratory failure secondary to a combination of decompensated heart failure and OHS. Improved hypercapnia and respiratory acidosis with mechanical ventilation. Optimized PEEP and weaned down FiO2, now at 12 & 40%. Overall respiratory failure improving with aggressive diuresis. He was in the process of getting evaluation for complex sleep disorder prior to admission and will need NIPPV moving forward. Renal - initiated on furosemide  for diuresis. AKI potentially cardiorenal on top of CKD especially that is improving with diuresis. Monitoring electrolytes twice daily. Endo - ICU glycemic protocol Hem/Onc - heparin  gtt for afib, on rivaroxaban  at home GI - NG tube placed, will initiate tube feeds. PPI for SUP. ID - afebrile but respiratory culture gram stain with gram positive cocci. Will re-initiate abx with CTX.  Best Practice (right click and Reselect all SmartList Selections daily)   Diet/type: NPO DVT prophylaxis systemic heparin  Pressure ulcer(s): N/A GI prophylaxis: PPI Lines: Central line and yes and it is still needed Foley:  Yes, and it is still needed Code Status:  full code Last date of multidisciplinary goals of care discussion [03/22/2023]  Labs   CBC: Recent Labs  Lab 03/20/23 1046 03/21/23 0246 03/22/23 0419  WBC 4.2 3.9* 7.1  HGB 18.9* 19.1* 18.1*  HCT 61.2* 59.9* 55.6*  MCV 93.7 91.7 88.4  PLT 175 193 187    Basic Metabolic Panel: Recent Labs  Lab 03/20/23 1046 03/21/23 0246 03/21/23 0251 03/21/23 1351 03/22/23 0012 03/22/23 0419  NA 140 142  --  142  --  142  K 4.8  4.2  --  4.4 4.0 3.9  CL 96* 98  --  97*  --  98  CO2 32 26  --  30  --  33*  GLUCOSE 147* 125*  --  130*  --  113*  BUN 44* 42*  --  37*  --  32*  CREATININE 2.19* 1.73*  --  1.66*  --  1.66*  CALCIUM  8.9 8.9  --  8.8*  --  8.9  MG  --   --  2.2  --   --  2.0  PHOS  --   --   --   --   --  2.8   GFR: Estimated Creatinine Clearance: 84.3 mL/min (A) (by C-G formula based on SCr of 1.66 mg/dL (H)). Recent Labs  Lab 03/20/23 1046 03/21/23 0246 03/21/23 0636 03/22/23 0419  WBC 4.2 3.9*  --  7.1  LATICACIDVEN  --  1.4 1.4  --     Liver Function Tests: Recent Labs  Lab 03/21/23 0246 03/22/23 0419  AST 20  --   ALT 30  --   ALKPHOS 165*  --   BILITOT 2.5*  --   PROT  6.6  --   ALBUMIN 3.3* 2.6*   No results for input(s): LIPASE, AMYLASE in the last 168 hours. No results for input(s): AMMONIA in the last 168 hours.  ABG    Component Value Date/Time   PHART 7.33 (L) 05/24/2021 1516   PCO2ART 62 (H) 05/24/2021 1516   PO2ART 71 (L) 05/24/2021 1516   HCO3 34.3 (H) 03/21/2023 0246   TCO2 31 05/08/2020 1324   TCO2 32 05/08/2020 1324   O2SAT 85.1 03/21/2023 0246     Coagulation Profile: Recent Labs  Lab 03/20/23 1046  INR 2.0*    Cardiac Enzymes: No results for input(s): CKTOTAL, CKMB, CKMBINDEX, TROPONINI in the last 168 hours.  HbA1C: Hgb A1c MFr Bld  Date/Time Value Ref Range Status  09/28/2022 11:34 AM 7.0 (H) 4.8 - 5.6 % Final    Comment:             Prediabetes: 5.7 - 6.4          Diabetes: >6.4          Glycemic control for adults with diabetes: <7.0   12/21/2021 11:08 AM 6.6 (H) 4.8 - 5.6 % Final    Comment:    (NOTE) Pre diabetes:          5.7%-6.4%  Diabetes:              >6.4%  Glycemic control for   <7.0% adults with diabetes     CBG: Recent Labs  Lab 03/21/23 1602 03/21/23 1959 03/21/23 2336 03/22/23 0406 03/22/23 0733  GLUCAP 151* 144* 123* 119* 117*    Review of Systems:   N/A  Past Medical History:  He,  has  a past medical history of Atrial flutter (HCC), CAD (coronary artery disease), Chronic combined systolic and diastolic CHF (congestive heart failure) (HCC), Diabetes mellitus without complication (HCC), Hyperlipidemia, Hypertension, NICM (nonischemic cardiomyopathy) (HCC), Nonadherence to medication, Obesity, and Sleep apnea, obstructive.   Surgical History:   Past Surgical History:  Procedure Laterality Date   RIGHT/LEFT HEART CATH AND CORONARY ANGIOGRAPHY N/A 05/08/2020   Procedure: RIGHT/LEFT HEART CATH AND CORONARY ANGIOGRAPHY;  Surgeon: Rolan Ezra RAMAN, MD;  Location: [facility] INVASIVE CV LAB;  Service: Cardiovascular;  Laterality: N/A;     Social History:   reports that he quit smoking about 2 years ago. His smoking use included cigarettes. He started smoking about 29 years ago. He has a 27 pack-year smoking history. He has never used smokeless tobacco. He reports that he does not currently use alcohol . He reports current drug use. Frequency: 7.00 times per week. Drug: Marijuana.   Family History:  His family history includes Hypertension in his brother and father; Pulmonary embolism in his brother.   Allergies No Known Allergies   Home Medications  Prior to Admission medications   Medication Sig Start Date End Date Taking? Authorizing Provider  atorvastatin  (LIPITOR ) 40 MG tablet Take 2 tablets (80 mg total) by mouth at bedtime. 03/16/23  Yes Donette Ellouise LABOR, FNP  carvedilol  (COREG ) 6.25 MG tablet Take 1 tablet (6.25 mg total) by mouth 2 (two) times daily with a meal. 09/20/22  Yes Donette Ellouise A, FNP  empagliflozin  (JARDIANCE ) 10 MG TABS tablet Take 1 tablet (10 mg total) by mouth daily before breakfast. 09/21/22  Yes Hackney, Tina A, FNP  rivaroxaban  (XARELTO ) 20 MG TABS tablet Take 1 tablet (20 mg total) by mouth daily with supper. 09/20/22  Yes Hackney, Ellouise A, FNP  sacubitril -valsartan  (ENTRESTO ) 49-51 MG Take 1 tablet by  mouth 2 (two) times daily. 03/07/23  Yes Donette [place] A, FNP   spironolactone  (ALDACTONE ) 25 MG tablet Take 1 tablet (25 mg total) by mouth once daily. 09/20/22  Yes Hackney, City A, FNP  torsemide  (DEMADEX ) 20 MG tablet Take 2 tablets (40 mg total) by mouth daily. 03/07/23  Yes Donette [place] A, FNP  empagliflozin  (JARDIANCE ) 10 MG TABS tablet Take 1 tablet (10 mg total) by mouth daily before breakfast. Patient not taking: Reported on 03/20/2023 03/07/23   Donette [place] A, FNP  OXYGEN Inhale 4 L into the lungs at bedtime.    [provider]     Critical care time: 52 minutes    Belva November, MD [facility] 03/22/2023 12:03 PM

## 2023-03-22 NOTE — Progress Notes (Signed)
Patient with heart rate 130-140's.  Curlene Dolphin notified.  STAT EKG ordered.

## 2023-03-22 NOTE — Plan of Care (Signed)

## 2023-03-22 NOTE — Progress Notes (Signed)
 Abnormal potassium 6.4. Janey Meek, NP notified.  Orders placed by NP.

## 2023-03-22 NOTE — Progress Notes (Addendum)
 Advanced Heart Failure Rounding Note  Cardiologist: Lonni Hanson, MD  Chief Complaint: Heart Failure  Subjective:   Brisk diuresis with IV lasix .   Remains intubated.   Objective:   Weight Range: (!) 181 kg Body mass index is 64.4 kg/m.   Vital Signs:   Temp:  [98 F (36.7 C)-98.9 F (37.2 C)] 98.9 F (37.2 C) (02/05 0400) Pulse Rate:  [61-71] 71 (02/05 0600) Resp:  [0-25] 17 (02/05 0600) BP: (85-113)/(51-86) 103/68 (02/05 0600) SpO2:  [89 %-97 %] 92 % (02/05 0600) FiO2 (%):  [40 %-60 %] 40 % (02/05 0326) Weight:  [181 kg] 181 kg (02/05 0431)    Weight change: Filed Weights   03/21/23 0227 03/22/23 0431  Weight: (!) 155.6 kg (!) 181 kg    Intake/Output:   Intake/Output Summary (Last 24 hours) at 03/22/2023 0748 Last data filed at 03/22/2023 0600 Gross per 24 hour  Intake 1846.65 ml  Output 7925 ml  Net -6078.35 ml     CVP 14-15 Physical Exam    General: Intubated.  Neck: Supple. JVP difficult to assess due to body habitus.  Cor: PMI nondisplaced. Regular rate & rhythm. No rubs, gallops or murmurs. Lungs: Coarse throughout Abdomen: obese, distended.  Extremities: No cyanosis, clubbing, rash, R and LLE 2-3+ edema. RUE PICC  Neuro:Intubated. Opens eyes. MAE x4    Telemetry  SR with PVCs.  EKG    N/A   Labs    CBC Recent Labs    03/21/23 0246 03/22/23 0419  WBC 3.9* 7.1  HGB 19.1* 18.1*  HCT 59.9* 55.6*  MCV 91.7 88.4  PLT 193 187   Basic Metabolic Panel Recent Labs    97/95/74 0251 03/21/23 1351 03/22/23 0012 03/22/23 0419  NA  --  142  --  142  K  --  4.4 4.0 3.9  CL  --  97*  --  98  CO2  --  30  --  33*  GLUCOSE  --  130*  --  113*  BUN  --  37*  --  32*  CREATININE  --  1.66*  --  1.66*  CALCIUM   --  8.8*  --  8.9  MG 2.2  --   --  2.0  PHOS  --   --   --  2.8   Liver Function Tests Recent Labs    03/21/23 0246 03/22/23 0419  AST 20  --   ALT 30  --   ALKPHOS 165*  --   BILITOT 2.5*  --   PROT 6.6  --    ALBUMIN 3.3* 2.6*   No results for input(s): LIPASE, AMYLASE in the last 72 hours. Cardiac Enzymes No results for input(s): CKTOTAL, CKMB, CKMBINDEX, TROPONINI in the last 72 hours.  BNP: BNP (last 3 results) Recent Labs    03/07/23 1207 03/09/23 0957 03/20/23 1046  BNP 2,259.3* 1,857.9* 1,596.6*    ProBNP (last 3 results) No results for input(s): PROBNP in the last 8760 hours.   D-Dimer No results for input(s): DDIMER in the last 72 hours. Hemoglobin A1C No results for input(s): HGBA1C in the last 72 hours. Fasting Lipid Panel Recent Labs    03/22/23 0419  TRIG 63   Thyroid Function Tests No results for input(s): TSH, T4TOTAL, T3FREE, THYROIDAB in the last 72 hours.  Invalid input(s): FREET3  Other results:   Imaging    DG Chest Port 1 View Result Date: 03/22/2023 CLINICAL DATA:  Acute respiratory failure with hypoxia.  EXAM: PORTABLE CHEST 1 VIEW COMPARISON:  03/21/2023 FINDINGS: Infected tube tip is 5.2 cm above the base of the carina. Low lung volumes with vascular congestion. Bibasilar collapse/consolidation is similar to prior. The cardio pericardial silhouette is enlarged. Telemetry leads overlie the chest. IMPRESSION: Low lung volumes with vascular congestion and bibasilar collapse/consolidation. Electronically Signed   By: Camellia Candle M.D.   On: 03/22/2023 07:33   ECHOCARDIOGRAM COMPLETE Result Date: 03/21/2023    ECHOCARDIOGRAM REPORT   Patient Name:   Gregory Mckenzie Date of Exam: 03/21/2023 Medical Rec #:  [id number]            Height:       66.0 in Accession #:    [id number]           Weight:       343.0 lb Date of Birth:  02-21-1975            BSA:          2.515 m Patient Age:    47 years             BP:           113/70 mmHg Patient Gender: M                    HR:           66 bpm. Exam Location:  [facility] Procedure: 2D Echo, Cardiac Doppler, Color Doppler and Intracardiac            Opacification Agent Indications:     CHF   History:         Patient has prior history of Echocardiogram examinations, most                  recent 10/20/2022. CHF and Cardiomyopathy, Acute MI,                  Arrythmias:Atrial Flutter, Signs/Symptoms:Edema and Dyspnea;                  Risk Factors:Hypertension and Diabetes. CKD.  Sonographer:     Naomie Reef Referring Phys:  [id number] ELIZABETH ACHIENG OUMA Diagnosing Phys: Cara JONETTA Lovelace MD  Sonographer Comments: Technically difficult study due to poor echo windows, no subcostal window, patient is obese and echo performed with patient supine and on artificial respirator. IMPRESSIONS  1. Left ventricular ejection fraction, by estimation, is 45 to 50%. The left ventricle has mildly decreased function. The left ventricle demonstrates global hypokinesis. There is severe concentric left ventricular hypertrophy. Left ventricular diastolic  parameters are consistent with Grade I diastolic dysfunction (impaired relaxation).  2. Right ventricular systolic function is moderately reduced. The right ventricular size is moderately enlarged. Mildly increased right ventricular wall thickness. There is mildly elevated pulmonary artery systolic pressure.  3. Left atrial size was mildly dilated.  4. Right atrial size was mildly dilated.  5. The mitral valve is normal in structure. No evidence of mitral valve regurgitation.  6. The aortic valve is normal in structure. Aortic valve regurgitation is not visualized. FINDINGS  Left Ventricle: Left ventricular ejection fraction, by estimation, is 45 to 50%. The left ventricle has mildly decreased function. The left ventricle demonstrates global hypokinesis. Definity  contrast agent was given IV to delineate the left ventricular  endocardial borders. The left ventricular internal cavity size was normal in size. There is severe concentric left ventricular hypertrophy. Left ventricular diastolic parameters are consistent with Grade I diastolic dysfunction (impaired relaxation).  Right Ventricle: The right ventricular size is moderately enlarged. Mildly increased right ventricular wall thickness. Right ventricular systolic function is moderately reduced. There is mildly elevated pulmonary artery systolic pressure. The tricuspid regurgitant velocity is 2.67 m/s, and with an assumed right atrial pressure of 15 mmHg, the estimated right ventricular systolic pressure is 43.5 mmHg. Left Atrium: Left atrial size was mildly dilated. Right Atrium: Right atrial size was mildly dilated. Pericardium: There is no evidence of pericardial effusion. Mitral Valve: The mitral valve is normal in structure. No evidence of mitral valve regurgitation. MV peak gradient, 1.8 mmHg. The mean mitral valve gradient is 1.0 mmHg. Tricuspid Valve: The tricuspid valve is normal in structure. Tricuspid valve regurgitation is trivial. Aortic Valve: The aortic valve is normal in structure. Aortic valve regurgitation is not visualized. Aortic valve mean gradient measures 3.0 mmHg. Aortic valve peak gradient measures 6.8 mmHg. Aortic valve area, by VTI measures 2.82 cm. Pulmonic Valve: The pulmonic valve was grossly normal. Pulmonic valve regurgitation is not visualized. Aorta: The ascending aorta was not well visualized. IAS/Shunts: No atrial level shunt detected by color flow Doppler.  LEFT VENTRICLE PLAX 2D LVIDd:         4.40 cm LVIDs:         3.30 cm LV PW:         1.70 cm LV IVS:        2.00 cm LVOT diam:     2.10 cm LV SV:         65 LV SV Index:   26 LVOT Area:     3.46 cm  RIGHT VENTRICLE RV Basal diam:  5.60 cm RV Mid diam:    4.10 cm RV S prime:     13.20 cm/s TAPSE (M-mode): 2.3 cm LEFT ATRIUM         Index       RIGHT ATRIUM           Index LA diam:    4.80 cm 1.91 cm/m  RA Area:     31.50 cm                                 RA Volume:   131.00 ml 52.08 ml/m  AORTIC VALVE                    PULMONIC VALVE AV Area (Vmax):    3.14 cm     PV Vmax:       0.98 m/s AV Area (Vmean):   2.94 cm     PV Peak grad:  3.8  mmHg AV Area (VTI):     2.82 cm AV Vmax:           130.00 cm/s AV Vmean:          81.000 cm/s AV VTI:            0.231 m AV Peak Grad:      6.8 mmHg AV Mean Grad:      3.0 mmHg LVOT Vmax:         118.00 cm/s LVOT Vmean:        68.800 cm/s LVOT VTI:          0.188 m LVOT/AV VTI ratio: 0.81  AORTA Ao Root diam: 3.40 cm MITRAL VALVE               TRICUSPID VALVE MV Area (PHT): 2.53 cm  TR Peak grad:   28.5 mmHg MV Area VTI:   2.79 cm    TR Vmax:        267.00 cm/s MV Peak grad:  1.8 mmHg MV Mean grad:  1.0 mmHg    SHUNTS MV Vmax:       0.67 m/s    Systemic VTI:  0.19 m MV Vmean:      41.7 cm/s   Systemic Diam: 2.10 cm MV Decel Time: 300 msec MV E velocity: 56.60 cm/s MV A velocity: 57.80 cm/s MV E/A ratio:  0.98 Dwayne JONETTA Lovelace MD Electronically signed by Cara JONETTA Lovelace MD Signature Date/Time: 03/21/2023/4:48:17 PM    Final    DG Chest Port 1 View Addendum Date: 03/21/2023 ADDENDUM REPORT: 03/21/2023 11:59 ADDENDUM: Study discussed by telephone with Dr. Isadora on 03/21/2023 at 1156 hours. Electronically Signed   By: VEAR Hurst M.D.   On: 03/21/2023 11:59   Result Date: 03/21/2023 CLINICAL DATA:  48 year old male with respiratory failure. Intubated. Enteric tube placement. EXAM: PORTABLE CHEST 1 VIEW COMPARISON:  Portable abdomen today. Portable chest 0039 hours today. FINDINGS: Portable AP upright view at 1048 hours. The enteric feeding tube loops at the level of the hypopharynx and does not continue distally. Stable endotracheal tube, tip in good position between the level the clavicles and carina. Stable enlarged cardiac silhouette and bibasilar pulmonary hypo ventilation. No superimposed pneumothorax or pulmonary edema. Paucity of bowel gas. Stable visualized osseous structures. IMPRESSION: 1. Enteric feeding tube loops at the hypopharynx and does not continue distally. This should be removed and re-attempted. 2. Satisfactory ET tube. Stable bibasilar hypo ventilation, evidence of cardiomegaly. Electronically  Signed: By: VEAR Hurst M.D. On: 03/21/2023 11:46   DG Abd 1 View Result Date: 03/21/2023 CLINICAL DATA:  48 year old male NG tube. EXAM: ABDOMEN - 1 VIEW COMPARISON:  05/21/2021. FINDINGS: 2 AP supine views at 1044 hours with no enteric tube identified in the lower chest or visible abdomen. Paucity of bowel gas. EKG leads and wires. IMPRESSION: No enteric tube visible at the diaphragm or in the upper abdomen. See portable chest x-ray 1048 hours reported separately. Electronically Signed   By: VEAR Hurst M.D.   On: 03/21/2023 11:44   US  EKG SITE RITE Result Date: 03/21/2023 If Site Rite image not attached, placement could not be confirmed due to current cardiac rhythm.    Medications:     Scheduled Medications:  atorvastatin   80 mg Oral QHS   Chlorhexidine  Gluconate Cloth  6 each Topical Daily   docusate  100 mg Oral BID   furosemide   80 mg Intravenous BID   insulin  aspart  2-6 Units Subcutaneous Q4H   ipratropium-albuterol   3 mL Nebulization Q6H   mouth rinse  15 mL Mouth Rinse Q2H   pantoprazole  (PROTONIX ) IV  40 mg Intravenous Q24H   polyethylene glycol  17 g Per Tube Daily   sodium chloride  flush  10-40 mL Intracatheter Q12H    Infusions:  fentaNYL  infusion INTRAVENOUS 150 mcg/hr (03/22/23 0600)   heparin  1,800 Units/hr (03/22/23 0600)   norepinephrine  (LEVOPHED ) Adult infusion 3 mcg/min (03/22/23 0600)   propofol  (DIPRIVAN ) infusion 20 mcg/kg/min (03/22/23 0600)    PRN Medications: albuterol , fentaNYL , ondansetron  **OR** ondansetron  (ZOFRAN ) IV, mouth rinse, sodium chloride  flush    Patient Profile  Gregory Mckenzie is 38 uyear old with history HFpEF, DMII, HLD, OSA, HTN, PAF, chronic hypoxic respiratory failure on 2-4 liters, and polycythemia. 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 Family history of HTN Dad/Brother.  Brother also had PE.   Admitted with A/C Hypoxic Respiratory Failure and A/C HFpEF Assessment/Plan   1. A/C HFpEF/ --Shock Echo 10/2022 preserved EF. Echo this admit EF  down to 45-50% RV moderately reduced.  Grade I DDRepeat ECHO today.  Failed outpatient Govan and IV lasix . Admitted with marked volume overload. BNP elevated.  - Brisk diuresis with IV lasix . CVP coming down. Needs additional diuresis. Continue 80 mg IV lasix  twice a day. Supp K - Pressure remains soft. On Norepi 3 mcg.  - Check CO-OX    2. Acute Hypoxic Respiratory Failure  -Failed Bipap, requiring intubation. Remains intubated. Per CCM - No OG due to bleedign.   -Respiratory panel negative.  -Off antibiotics.     3. CKD Stage IIIa -Creatinine baseline 1.4-1.6 -Stable.  -Follow BMET with diuresis.    4. Polycythemia -Hgb 18.1 -Brother had PE.    5. DMII   6.  Obesity   CRITICAL CARE Performed by: Greig Mosses Total critical care time: 20 minutes Critical care was time spent personally by me on the following activities: development of treatment plan with patient and/or surrogate as well as nursing, discussions with consultants, evaluation of patient's response to treatment, examination of patient, obtaining history from patient or surrogate, ordering and performing treatments and interventions, ordering and review of laboratory studies, ordering and review of radiographic studies, pulse oximetry and re-evaluation of patient's condition.    Length of Stay: 2  Greig Mosses, NP  03/22/2023, 7:48 AM  Advanced Heart Failure Team Pager [phone] (M-F; 7a - 5p)  Please contact [facility] for night-coverage after hours (5p -7a ) and weekends on [email or web address]  Patient seen with NP.  I formulated the plan and agree with the above note.   Patient remains intubated, on NE 3 and propofol .  Unable to place OG tube.  CVP 16-17 on my read. Good diuresis yesterday, I/Os net negative 6078.   Echo yesterday showed EF 45-50%, severe LVH, moderate RV enlargement and moderate RV dysfunction.    General: Intubated/sedated.  Neck: Thick, JVP 16 cm, no thyromegaly or thyroid nodule.  Lungs: Decreased BS  at bases.  CV: Nondisplaced PMI.  Heart regular S1/S2, no S3/S4, no murmur.  1+ edema to thighs.  Abdomen: Soft, nontender, no hepatosplenomegaly, no distention.  Skin: Intact without lesions or rashes.  Neurologic: Sedated on vent.  Extremities: No clubbing or cyanosis.  HEENT: Normal.   1. Acute on chronic HF with mid-range EF: Significant RV dysfunction and likely significant pulmonary hypertension in the setting of OHS/OSA. Echo this admission showed EF 45-50%, severe LVH, moderate RV enlargement and moderate RV dysfunction, PASP 44 mmHg. He is still volume overloaded by exam but diuresing well so far.  CVP 16-17 on my read today.  - Continue Lasix  80 mg IV bid, replace K as needed.   - Follow CVP on PICC line.  - After extubation and further diuresis, would arrange for RHC to assess filling pressures/PA pressure.  2. Hypotension/shock: Suspect this is due to combination of RV failure and sedation with intubation.  Currently on NE 3.  - Wean NE for MAP 65.  Hopefully can stop this soon as sedation is weaned.  3. Acute hypoxemic/hypercarbic respiratory failure: In setting of OHS/OSA (on home oxygen 3-4 L, has not had CPAP for 6 months) and CHF/volume overload.  Now intubated, respiratory mechanics improving with diuresis.  - Continue Lasix  80 mg IV bid.  - Vent per CCM, hopefully extubate soon.  - Will need  to get back on CPAP at home.  4. CKD stage 3: Creatinine 1.66 (stable), this is near his baseline (1.4-1.6).  Follow with diuresis.  5. Polycythemia: Likely due to chronic hypoxemia.  6. Elevated troponin: Mild elevation, no trend.  Had cath in 2022 with minimal nonobstructive CAD.  - Continue atorvastatin .  7. Atrial fibrillation: Paroxysmal.  He is in NSR today.  On Xarelto  at home.  - Continue heparin  gtt while NPO.  8. DM2: SSI.   CRITICAL CARE Performed by: Ezra Shuck  Total critical care time: 35 minutes  Critical care time was exclusive of separately billable procedures  and treating other patients.  Critical care was necessary to treat or prevent imminent or life-threatening deterioration.  Critical care was time spent personally by me on the following activities: development of treatment plan with patient and/or surrogate as well as nursing, discussions with consultants, evaluation of patient's response to treatment, examination of patient, obtaining history from patient or surrogate, ordering and performing treatments and interventions, ordering and review of laboratory studies, ordering and review of radiographic studies, pulse oximetry and re-evaluation of patient's condition.  Ezra Shuck 03/22/2023 8:49 AM

## 2023-03-23 ENCOUNTER — Telehealth: Payer: Self-pay

## 2023-03-23 DIAGNOSIS — J9621 Acute and chronic respiratory failure with hypoxia: Secondary | ICD-10-CM | POA: Diagnosis not present

## 2023-03-23 DIAGNOSIS — G928 Other toxic encephalopathy: Secondary | ICD-10-CM | POA: Diagnosis not present

## 2023-03-23 DIAGNOSIS — I5033 Acute on chronic diastolic (congestive) heart failure: Secondary | ICD-10-CM | POA: Diagnosis not present

## 2023-03-23 DIAGNOSIS — I4892 Unspecified atrial flutter: Secondary | ICD-10-CM | POA: Diagnosis not present

## 2023-03-23 DIAGNOSIS — J810 Acute pulmonary edema: Secondary | ICD-10-CM

## 2023-03-23 LAB — COOXEMETRY PANEL
Carboxyhemoglobin: 1.7 % — ABNORMAL HIGH (ref 0.5–1.5)
Methemoglobin: <0.7 % (ref 0.0–1.5)
O2 Saturation: 67 %
Total hemoglobin: 18.6 g/dL — ABNORMAL HIGH (ref 12.0–16.0)
Total oxygen content: 65.5 %

## 2023-03-23 LAB — BLOOD GAS, ARTERIAL
Acid-Base Excess: 15.6 mmol/L — ABNORMAL HIGH (ref 0.0–2.0)
Bicarbonate: 39.4 mmol/L — ABNORMAL HIGH (ref 20.0–28.0)
FIO2: 50 %
MECHVT: 500 mL
O2 Saturation: 88.8 %
PEEP: 10 cmH2O
Patient temperature: 37
RATE: 24 {breaths}/min
pCO2 arterial: 43 mm[Hg] (ref 32–48)
pH, Arterial: 7.57 — ABNORMAL HIGH (ref 7.35–7.45)
pO2, Arterial: 55 mm[Hg] — ABNORMAL LOW (ref 83–108)

## 2023-03-23 LAB — CBC
HCT: 56.3 % — ABNORMAL HIGH (ref 39.0–52.0)
Hemoglobin: 18.6 g/dL — ABNORMAL HIGH (ref 13.0–17.0)
MCH: 29 pg (ref 26.0–34.0)
MCHC: 33 g/dL (ref 30.0–36.0)
MCV: 87.7 fL (ref 80.0–100.0)
Platelets: 175 10*3/uL (ref 150–400)
RBC: 6.42 MIL/uL — ABNORMAL HIGH (ref 4.22–5.81)
RDW: 20.7 % — ABNORMAL HIGH (ref 11.5–15.5)
WBC: 4 10*3/uL (ref 4.0–10.5)
nRBC: 0.8 % — ABNORMAL HIGH (ref 0.0–0.2)

## 2023-03-23 LAB — PHOSPHORUS
Phosphorus: 3.3 mg/dL (ref 2.5–4.6)
Phosphorus: 4.8 mg/dL — ABNORMAL HIGH (ref 2.5–4.6)

## 2023-03-23 LAB — GLUCOSE, CAPILLARY
Glucose-Capillary: 112 mg/dL — ABNORMAL HIGH (ref 70–99)
Glucose-Capillary: 116 mg/dL — ABNORMAL HIGH (ref 70–99)
Glucose-Capillary: 122 mg/dL — ABNORMAL HIGH (ref 70–99)
Glucose-Capillary: 131 mg/dL — ABNORMAL HIGH (ref 70–99)
Glucose-Capillary: 134 mg/dL — ABNORMAL HIGH (ref 70–99)
Glucose-Capillary: 81 mg/dL (ref 70–99)

## 2023-03-23 LAB — MAGNESIUM
Magnesium: 2 mg/dL (ref 1.7–2.4)
Magnesium: 2.2 mg/dL (ref 1.7–2.4)

## 2023-03-23 LAB — RENAL FUNCTION PANEL
Albumin: 2.6 g/dL — ABNORMAL LOW (ref 3.5–5.0)
Anion gap: 12 (ref 5–15)
BUN: 25 mg/dL — ABNORMAL HIGH (ref 6–20)
CO2: 33 mmol/L — ABNORMAL HIGH (ref 22–32)
Calcium: 8.8 mg/dL — ABNORMAL LOW (ref 8.9–10.3)
Chloride: 97 mmol/L — ABNORMAL LOW (ref 98–111)
Creatinine, Ser: 1.5 mg/dL — ABNORMAL HIGH (ref 0.61–1.24)
GFR, Estimated: 57 mL/min — ABNORMAL LOW (ref >60)
Glucose, Bld: 112 mg/dL — ABNORMAL HIGH (ref 70–99)
Phosphorus: 3.3 mg/dL (ref 2.5–4.6)
Potassium: 3.7 mmol/L (ref 3.5–5.1)
Sodium: 142 mmol/L (ref 135–145)

## 2023-03-23 LAB — HEPARIN LEVEL (UNFRACTIONATED): Heparin Unfractionated: 0.66 [IU]/mL (ref 0.30–0.70)

## 2023-03-23 LAB — APTT: aPTT: 79 s — ABNORMAL HIGH (ref 24–36)

## 2023-03-23 LAB — POTASSIUM: Potassium: 4.3 mmol/L (ref 3.5–5.1)

## 2023-03-23 MED ORDER — POTASSIUM CHLORIDE 10 MEQ/50ML IV SOLN
10.0000 meq | INTRAVENOUS | Status: AC
  Administered 2023-03-23 (×2): 10 meq via INTRAVENOUS
  Filled 2023-03-23 (×2): qty 50

## 2023-03-23 MED ORDER — POTASSIUM CHLORIDE 20 MEQ PO PACK
40.0000 meq | PACK | Freq: Once | ORAL | Status: AC
  Administered 2023-03-23: 40 meq
  Filled 2023-03-23: qty 2

## 2023-03-23 MED ORDER — POTASSIUM CHLORIDE 10 MEQ/50ML IV SOLN
10.0000 meq | INTRAVENOUS | Status: DC
Start: 2023-03-23 — End: 2023-03-23
  Filled 2023-03-23 (×3): qty 50

## 2023-03-23 MED ORDER — SPIRONOLACTONE 12.5 MG HALF TABLET
12.5000 mg | ORAL_TABLET | Freq: Every day | ORAL | Status: DC
  Administered 2023-03-23 – 2023-03-26 (×4): 12.5 mg via ORAL
  Filled 2023-03-23 (×5): qty 1

## 2023-03-23 NOTE — Progress Notes (Signed)
 ANTICOAGULATION CONSULT NOTE  Pharmacy Consult for Heparin  Infusion Indication: atrial fibrillation  Patient Measurements: Weight: (!) 181 kg (399 lb) Heparin  Dosing Weight: 101.6 kg  Labs: Recent Labs    03/20/23 1046 03/20/23 1513 03/20/23 1844 03/21/23 0246 03/21/23 0636 03/21/23 1351 03/21/23 2123 03/22/23 0419 03/22/23 1227 03/23/23 0516  HGB 18.9*  --   --  19.1*  --   --   --  18.1*  --  18.6*  HCT 61.2*  --   --  59.9*  --   --   --  55.6*  --  56.3*  PLT 175  --   --  193  --   --   --  187  --  175  APTT  --   --   --   --  36  --    < > 80* 78* 79*  LABPROT 23.3*  --   --   --   --   --   --   --   --   --   INR 2.0*  --   --   --   --   --   --   --   --   --   HEPARINUNFRC  --   --   --   --  >1.10*  --   --  >1.10*  --  0.66  CREATININE 2.19*  --   --  1.73*  --  1.66*  --  1.66*  --  1.50*  TROPONINIHS 122* 152* 121*  --   --   --   --   --   --   --    < > = values in this interval not displayed.    Estimated Creatinine Clearance: 93.3 mL/min (A) (by C-G formula based on SCr of 1.5 mg/dL (H)).   Medical History: Past Medical History:  Diagnosis Date   Atrial flutter (HCC)    a. CHA2DS2VASc = 4-->xarelto .   CAD (coronary artery disease)    a. 04/2020 Cath: LM nl, LAD 30ost/p, LCX nl, RCA 30p/m.   Chronic combined systolic and diastolic CHF (congestive heart failure) (HCC)    a. 03/2020 Echo: EF 40-45%; b. 05/2021 Echo: EF 40-45%; c. 10/2022 Echo: EF 55-60%, no rwma, sev LVH, RVSP 23.1 mmHg.   Diabetes mellitus without complication (HCC)    Hyperlipidemia    Hypertension    NICM (nonischemic cardiomyopathy) (HCC)    Nonadherence to medication    Obesity    Sleep apnea, obstructive     Medications:  PTA Meds: Rivaroxaban  20 mg qpm, last dose 03/20/23 @ 1637  Assessment: Pt is a 48 yo male with h/o A. Fib, on Xarelto , admitted for CHF exacerbation, now found with elevated troponins likely demand ischemia, being transitioned to heparin  infusion.  Cardiology consulted.  Goal of Therapy:  Heparin  level 0.3-0.7 units/ml aPTT 66-102 seconds Monitor platelets by anticoagulation protocol: Yes  0204 2123 aPTT 52, subtherapeutic 0205 0419 aPTT 80, therapeutic x 1 / HL >1.1, not correlating 0205 1227 aPTT 78, therapeutic x 2 0206 0516 aPTT 66, therapeutic x 3 / HL .66, correlating x 1   Plan:  --Continue heparin  infusion at 1800 units/hr --aPTT, HL, CBC tomorrow AM --Will follow aPTT until correlation w/ HL confirmed  Rankin CANDIE Dills, PharmD, [facility] 03/23/2023 6:06 AM

## 2023-03-23 NOTE — Progress Notes (Signed)
 NAME:  Gregory Mckenzie, MRN:  [id number], DOB:  18-Nov-1975, LOS: 3 ADMISSION DATE:  03/20/2023, CHIEF COMPLAINT:  Respiratory Failure   History of Present Illness:   48 y.o morbidly obese male with significant PMH of chronic combined systolic and diastolic CHF, atrial fibrillation/flutter, T2DM, HLD, HTN, OSA not on CPAP, obesity hypoventilation syndrome, former smoker, and polycythemia who presented to the ED from the heart failure clinic with chief complaints of acute respiratory distress.   Per ED reports patient was seen in heart failure clinic for evaluation of shortness of breath with very little exertion, fatigue, worsening abdominal distention, cough, worsening pedal edema and weight gain.  He ran out of his oxygen last night around 9 PM.  When he was noted with sats in the lower 60s on room air she was placed on 3L which was increased to 5 L with only improvement up to 83%.  Patient was sent to the ED for further evaluation   ED Course: Initial vital signs showed HR of 86 beats/minute, BP 153/130 mm Hg, the RR 20 breaths/minute, and the oxygen saturation 84% on 5L and a temperature of 98.61F (37.1C).  Pertinent Labs/Diagnostics Findings: Na+/ K+: 140/4.8.  Glucose: 147.  BUN/Cr.:  44/2.19  WBC: 4.2 K/L COVID PCR: Negative,  troponin: 122~152~121  BNP:1596.6   VBG: pO2 47; pCO2 102; pH 7.19;  HCO3 39.0, %O2 Sat 58.6.  CXR> Cardiomegaly, vascular congestion  Medication administered in the ED: Disposition: Patient was given IV Lasix , placed on BiPAP and admitted to hospitalist service.  He however continued declined to wear his BiPAP.  He was given sedation with no improvement.  While still holding in the ED he was noted to be more lethargic and and less responsive therefore he was intubated for airway protection.  PCCM consulted  Pertinent  Medical History  chronic combined systolic and diastolic CHF, atrial fibrillation/flutter, T2DM, HLD, HTN, OSA not on CPAP, obesity  hypoventilation syndrome, former smoker, and polycythemia     Significant Hospital Events: Including procedures, antibiotic start and stop dates in addition to other pertinent events   2/3: Admitted to hospitalist service with acute on chronic hypoxic hypercapnic respiratory failure in the setting of CHF exacerbation. Failed BiPAP requiring intubation.  PCCM consulted 2/4: remains intubated, on diuresis, PICC line placed 2/5: aggressively diuresed, improved oxygen requirements 2/6: remains intubated, Co-ox 67% this AM. Kidney function improved  Objective   Blood pressure 96/62, pulse 63, temperature 98 F (36.7 C), temperature source Axillary, resp. rate (!) 24, weight 127.5 kg, SpO2 91%. CVP:  [7 mmHg-17 mmHg] 13 mmHg  Vent Mode: PRVC FiO2 (%):  [40 %-50 %] 50 % Set Rate:  [24 bmp] 24 bmp Vt Set:  [500 mL] 500 mL PEEP:  [10 cmH20-12 cmH20] 10 cmH20 Plateau Pressure:  [28 cmH20] 28 cmH20   Intake/Output Summary (Last 24 hours) at 03/23/2023 0804 Last data filed at 03/23/2023 0600 Gross per 24 hour  Intake 2259.53 ml  Output 9380 ml  Net -7120.47 ml   Filed Weights   03/21/23 0227 03/22/23 0431 03/23/23 0500  Weight: (!) 155.6 kg (!) 181 kg 127.5 kg    Examination: Physical Exam Constitutional:      Appearance: He is obese. He is ill-appearing.  Cardiovascular:     Rate and Rhythm: Normal rate and regular rhythm.     Pulses: Normal pulses.     Heart sounds: Normal heart sounds.  Pulmonary:     Effort: Pulmonary effort is normal.  Breath sounds: Normal breath sounds. No wheezing.  Musculoskeletal:     Right lower leg: Edema present.     Left lower leg: Edema present.  Neurological:     Mental Status: He is disoriented.     Assessment & Plan:   #Acute on Chronic Hypoxic and Hypercapnic Respiratory Failure #Acute on Chronic Decompensated HFpEF #Pulmonary Edema #OHS  OSA #AKI on CKD #Toxic Metabolic Encephalopathy  CO2 Narcosis #T2DM  Neuro - encephalopathy  secondary to CO2 narcosis. Intubated for respiratory failure and encephalopathy, now on propofol  and fentanyl  for analgosedation, goal RASS -1. Follows commands, respiratory failure barrier to extubation. CV - decompensated heart failure with significant volume overload. Initiated on aggressive IV diuresis with brisk response, now -17 L. PICC line placed, will trend central venous saturation, 67% today. Highly appreciate input from cardiology. Pulm - hypoxic and hypercapnic respiratory failure secondary to a combination of decompensated heart failure and OHS. Improved hypercapnia and respiratory acidosis with mechanical ventilation. Optimized PEEP and weaned down FiO2, now at 10 of PEEP. Overall respiratory failure improving with aggressive diuresis. He was in the process of getting evaluation for complex sleep disorder prior to admission and will need NIPPV moving forward. Renal - initiated on furosemide  for diuresis, at 80 mg IV bid. AKI potentially cardiorenal on top of CKD especially that is continues to improve with aggressive diuresis. Did have hyperkalemia yesterday but this was treated with K binders. Monitoring electrolytes twice daily. Endo - ICU glycemic protocol Hem/Onc - heparin  gtt for afib, on rivaroxaban  at home GI - NG tube placed, will initiate tube feeds. PPI for SUP. ID - afebrile but respiratory culture gram stain with gram positive cocci. Will re-initiate abx with CTX.  Best Practice (right click and Reselect all SmartList Selections daily)   Diet/type: tubefeeds DVT prophylaxis systemic heparin  Pressure ulcer(s): N/A GI prophylaxis: PPI Lines: Central line and yes and it is still needed Foley:  Yes, and it is still needed Code Status:  full code Last date of multidisciplinary goals of care discussion [03/23/2023]  Labs   CBC: Recent Labs  Lab 03/20/23 1046 03/21/23 0246 03/22/23 0419 03/23/23 0516  WBC 4.2 3.9* 7.1 4.0  HGB 18.9* 19.1* 18.1* 18.6*  HCT 61.2* 59.9*  55.6* 56.3*  MCV 93.7 91.7 88.4 87.7  PLT 175 193 187 175    Basic Metabolic Panel: Recent Labs  Lab 03/20/23 1046 03/21/23 0246 03/21/23 0251 03/21/23 1351 03/22/23 0012 03/22/23 0419 03/22/23 1227 03/22/23 1312 03/22/23 1709 03/22/23 2326 03/23/23 0516  NA 140 142  --  142  --  142  --   --   --   --  142  K 4.8 4.2  --  4.4   < > 3.9 6.4* 3.7 3.6 3.1* 3.7  CL 96* 98  --  97*  --  98  --   --   --   --  97*  CO2 32 26  --  30  --  33*  --   --   --   --  33*  GLUCOSE 147* 125*  --  130*  --  113*  --   --   --   --  112*  BUN 44* 42*  --  37*  --  32*  --   --   --   --  25*  CREATININE 2.19* 1.73*  --  1.66*  --  1.66*  --   --   --   --  1.50*  CALCIUM   8.9 8.9  --  8.8*  --  8.9  --   --   --   --  8.8*  MG  --   --  2.2  --   --  2.0  --   --  2.1  --  2.0  PHOS  --   --   --   --   --  2.8  --   --  2.8  --  3.3  3.3   < > = values in this interval not displayed.   GFR: Estimated Creatinine Clearance: 75.2 mL/min (A) (by C-G formula based on SCr of 1.5 mg/dL (H)). Recent Labs  Lab 03/20/23 1046 03/21/23 0246 03/21/23 0636 03/22/23 0419 03/22/23 1252 03/23/23 0516  WBC 4.2 3.9*  --  7.1  --  4.0  LATICACIDVEN  --  1.4 1.4  --  1.7  --     Liver Function Tests: Recent Labs  Lab 03/21/23 0246 03/22/23 0419 03/23/23 0516  AST 20  --   --   ALT 30  --   --   ALKPHOS 165*  --   --   BILITOT 2.5*  --   --   PROT 6.6  --   --   ALBUMIN 3.3* 2.6* 2.6*   No results for input(s): LIPASE, AMYLASE in the last 168 hours. No results for input(s): AMMONIA in the last 168 hours.  ABG    Component Value Date/Time   PHART 7.57 (H) 03/23/2023 0303   PCO2ART 43 03/23/2023 0303   PO2ART 55 (L) 03/23/2023 0303   HCO3 39.4 (H) 03/23/2023 0303   TCO2 31 05/08/2020 1324   TCO2 32 05/08/2020 1324   O2SAT 67 03/23/2023 0517     Coagulation Profile: Recent Labs  Lab 03/20/23 1046  INR 2.0*    Cardiac Enzymes: No results for input(s): CKTOTAL, CKMB,  CKMBINDEX, TROPONINI in the last 168 hours.  HbA1C: Hgb A1c MFr Bld  Date/Time Value Ref Range Status  09/28/2022 11:34 AM 7.0 (H) 4.8 - 5.6 % Final    Comment:             Prediabetes: 5.7 - 6.4          Diabetes: >6.4          Glycemic control for adults with diabetes: <7.0   12/21/2021 11:08 AM 6.6 (H) 4.8 - 5.6 % Final    Comment:    (NOTE) Pre diabetes:          5.7%-6.4%  Diabetes:              >6.4%  Glycemic control for   <7.0% adults with diabetes     CBG: Recent Labs  Lab 03/22/23 1549 03/22/23 1940 03/22/23 2322 03/23/23 0359 03/23/23 0736  GLUCAP 133* 115* 100* 81 131*    Review of Systems:   N/A  Past Medical History:  He,  has a past medical history of Atrial flutter (HCC), CAD (coronary artery disease), Chronic combined systolic and diastolic CHF (congestive heart failure) (HCC), Diabetes mellitus without complication (HCC), Hyperlipidemia, Hypertension, NICM (nonischemic cardiomyopathy) (HCC), Nonadherence to medication, Obesity, and Sleep apnea, obstructive.   Surgical History:   Past Surgical History:  Procedure Laterality Date   RIGHT/LEFT HEART CATH AND CORONARY ANGIOGRAPHY N/A 05/08/2020   Procedure: RIGHT/LEFT HEART CATH AND CORONARY ANGIOGRAPHY;  Surgeon: Rolan Ezra RAMAN, MD;  Location: [facility] INVASIVE CV LAB;  Service: Cardiovascular;  Laterality: N/A;     Social History:   reports  that he quit smoking about 2 years ago. His smoking use included cigarettes. He started smoking about 29 years ago. He has a 27 pack-year smoking history. He has never used smokeless tobacco. He reports that he does not currently use alcohol . He reports current drug use. Frequency: 7.00 times per week. Drug: Marijuana.   Family History:  His family history includes Hypertension in his brother and father; Pulmonary embolism in his brother.   Allergies No Known Allergies   Home Medications  Prior to Admission medications   Medication Sig Start Date End Date  Taking? Authorizing Provider  atorvastatin  (LIPITOR ) 40 MG tablet Take 2 tablets (80 mg total) by mouth at bedtime. 03/16/23  Yes Donette Ellouise LABOR, FNP  carvedilol  (COREG ) 6.25 MG tablet Take 1 tablet (6.25 mg total) by mouth 2 (two) times daily with a meal. 09/20/22  Yes Donette Ellouise A, FNP  empagliflozin  (JARDIANCE ) 10 MG TABS tablet Take 1 tablet (10 mg total) by mouth daily before breakfast. 09/21/22  Yes Hackney, Tina A, FNP  rivaroxaban  (XARELTO ) 20 MG TABS tablet Take 1 tablet (20 mg total) by mouth daily with supper. 09/20/22  Yes Hackney, Ellouise A, FNP  sacubitril -valsartan  (ENTRESTO ) 49-51 MG Take 1 tablet by mouth 2 (two) times daily. 03/07/23  Yes Hackney, Ellouise A, FNP  spironolactone  (ALDACTONE ) 25 MG tablet Take 1 tablet (25 mg total) by mouth once daily. 09/20/22  Yes Hackney, Ellouise A, FNP  torsemide  (DEMADEX ) 20 MG tablet Take 2 tablets (40 mg total) by mouth daily. 03/07/23  Yes Donette Ellouise LABOR, FNP  empagliflozin  (JARDIANCE ) 10 MG TABS tablet Take 1 tablet (10 mg total) by mouth daily before breakfast. Patient not taking: Reported on 03/20/2023 03/07/23   Donette Ellouise A, FNP  OXYGEN Inhale 4 L into the lungs at bedtime.    [provider]     Critical care time: 47 minutes   Belva November, MD [facility] 03/23/2023 8:55 AM

## 2023-03-23 NOTE — Progress Notes (Signed)
 Patient ID: Gregory Mckenzie, male   DOB: 1975/04/16, 48 y.o.   MRN: [id number]     Advanced Heart Failure Rounding Note  Cardiologist: Lonni Hanson, MD  Chief Complaint: Heart Failure  Subjective:    Brisk diuresis again with IV lasix , I/Os net negative 7120 cc. He is off NE currently, SBP 100s.  CVP remains 14 with co-ox 67%.   Remains intubated, FiO2 0.5. Creatinine 1.66 => 1.5.    Objective:   Weight Range: 127.5 kg Body mass index is 45.37 kg/m.   Vital Signs:   Temp:  [97.5 F (36.4 C)-100.1 F (37.8 C)] 98 F (36.7 C) (02/06 0000) Pulse Rate:  [38-139] 63 (02/06 0615) Resp:  [21-25] 24 (02/06 0615) BP: (80-151)/(48-113) 96/62 (02/06 0615) SpO2:  [88 %-97 %] 91 % (02/06 0615) FiO2 (%):  [40 %-50 %] 50 % (02/06 0300) Weight:  [127.5 kg] 127.5 kg (02/06 0500) Last BM Date :  (PTA)  Weight change: Filed Weights   03/21/23 0227 03/22/23 0431 03/23/23 0500  Weight: (!) 155.6 kg (!) 181 kg 127.5 kg    Intake/Output:   Intake/Output Summary (Last 24 hours) at 03/23/2023 0751 Last data filed at 03/23/2023 0600 Gross per 24 hour  Intake 2259.53 ml  Output 9380 ml  Net -7120.47 ml     CVP 14  Physical Exam   General: intubated Neck: Thick, JVP 12-14 cm, no thyromegaly or thyroid nodule.  Lungs: Clear to auscultation bilaterally with normal respiratory effort. CV: Nondisplaced PMI.  Heart regular S1/S2, no S3/S4, no murmur.  1+ edema to knees.  Abdomen: Soft, nontender, no hepatosplenomegaly, no distention.  Skin: Intact without lesions or rashes.  Neurologic: Sedated but will awaken.  Extremities: No clubbing or cyanosis.  HEENT: Normal.    Telemetry   NSR with occasional PVCs (personally reviewed).   EKG    N/A   Labs    CBC Recent Labs    03/22/23 0419 03/23/23 0516  WBC 7.1 4.0  HGB 18.1* 18.6*  HCT 55.6* 56.3*  MCV 88.4 87.7  PLT 187 175   Basic Metabolic Panel Recent Labs    97/94/74 0419 03/22/23 1227 03/22/23 1709  03/22/23 2326 03/23/23 0516  NA 142  --   --   --  142  K 3.9   < > 3.6 3.1* 3.7  CL 98  --   --   --  97*  CO2 33*  --   --   --  33*  GLUCOSE 113*  --   --   --  112*  BUN 32*  --   --   --  25*  CREATININE 1.66*  --   --   --  1.50*  CALCIUM  8.9  --   --   --  8.8*  MG 2.0  --  2.1  --  2.0  PHOS 2.8  --  2.8  --  3.3  3.3   < > = values in this interval not displayed.   Liver Function Tests Recent Labs    03/21/23 0246 03/22/23 0419 03/23/23 0516  AST 20  --   --   ALT 30  --   --   ALKPHOS 165*  --   --   BILITOT 2.5*  --   --   PROT 6.6  --   --   ALBUMIN 3.3* 2.6* 2.6*   No results for input(s): LIPASE, AMYLASE in the last 72 hours. Cardiac Enzymes No results for input(s): CKTOTAL, CKMB,  CKMBINDEX, TROPONINI in the last 72 hours.  BNP: BNP (last 3 results) Recent Labs    03/07/23 1207 03/09/23 0957 03/20/23 1046  BNP 2,259.3* 1,857.9* 1,596.6*    ProBNP (last 3 results) No results for input(s): PROBNP in the last 8760 hours.   D-Dimer No results for input(s): DDIMER in the last 72 hours. Hemoglobin A1C No results for input(s): HGBA1C in the last 72 hours. Fasting Lipid Panel Recent Labs    03/22/23 0419  TRIG 63   Thyroid Function Tests No results for input(s): TSH, T4TOTAL, T3FREE, THYROIDAB in the last 72 hours.  Invalid input(s): FREET3  Other results:   Imaging    DG Abd 1 View Result Date: 03/22/2023 CLINICAL DATA:  48 year old male enteric tube placement. EXAM: ABDOMEN - 1 VIEW COMPARISON:  03/21/2023 and earlier. FINDINGS: Portable AP semi upright view at 0859 hours. Enteric tube placed into the stomach and terminates across midline to the right at the level of the gastric antrum. Compared to portable chest x-ray 0308 hours today there is bilateral dense lung base opacity. Nonobstructed visible bowel gas pattern. IMPRESSION: 1. Satisfactory enteric tube placement into the distal stomach. 2. Confluent  bilateral lung base opacity. Electronically Signed   By: VEAR Hurst M.D.   On: 03/22/2023 10:05     Medications:     Scheduled Medications:  atorvastatin   80 mg Oral QHS   Chlorhexidine  Gluconate Cloth  6 each Topical Daily   docusate  100 mg Oral BID   feeding supplement (PROSource TF20)  60 mL Per Tube Daily   feeding supplement (VITAL HIGH PROTEIN)  1,000 mL Per Tube Q24H   free water   30 mL Per Tube Q4H   furosemide   80 mg Intravenous BID   insulin  aspart  2-6 Units Subcutaneous Q4H   ipratropium-albuterol   3 mL Nebulization Q6H   mouth rinse  15 mL Mouth Rinse Q2H   pantoprazole  (PROTONIX ) IV  40 mg Intravenous Q24H   polyethylene glycol  17 g Oral Daily   sodium chloride  flush  10-40 mL Intracatheter Q12H   spironolactone   12.5 mg Oral Daily    Infusions:  cefTRIAXone  (ROCEPHIN )  IV Stopped (03/22/23 1117)   fentaNYL  infusion INTRAVENOUS 150 mcg/hr (03/23/23 0600)   heparin  1,800 Units/hr (03/23/23 0600)   norepinephrine  (LEVOPHED ) Adult infusion Stopped (03/23/23 0046)   propofol  (DIPRIVAN ) infusion 35 mcg/kg/min (03/23/23 0600)    PRN Medications: albuterol , fentaNYL , ondansetron  **OR** ondansetron  (ZOFRAN ) IV, mouth rinse, sodium chloride  flush    Patient Profile  Gregory Mckenzie is 54 uyear old with history HFpEF, DMII, HLD, OSA, HTN, PAF, chronic hypoxic respiratory failure on 2-4 liters, and polycythemia. 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 Family history of HTN Dad/Brother. Brother also had PE.   Admitted with A/C Hypoxic Respiratory Failure and A/C HFpEF Assessment/Plan   1. Acute on chronic HF with mid-range EF: Significant RV dysfunction and likely significant pulmonary hypertension in the setting of OHS/OSA. Echo this admission showed EF 45-50%, severe LVH, moderate RV enlargement and moderate RV dysfunction, PASP 44 mmHg. He remains volume overloaded by exam but diuresing well still.  CVP 14 on my read today.  - Continue Lasix  80 mg IV bid today, replace K.  -  Add spironolactone  12.5 daily.    - Follow CVP on PICC line.  - After extubation and further diuresis, would arrange for RHC to assess filling pressures/PA pressure, ?tomorrow.  2. Hypotension/shock: Suspect this is due to combination of RV failure and sedation with intubation.  He has been weaned off NE this morning.  3. Acute hypoxemic/hypercarbic respiratory failure: In setting of OHS/OSA (on home oxygen 3-4 L, has not had CPAP for 6 months) and CHF/volume overload.  Now intubated, respiratory mechanics improving with diuresis. FiO2 0.5.  - Continue Lasix  80 mg IV bid, needs more fluid off.   - Vent per CCM, hopefully extubate soon.  - Will need to get back on CPAP at home.  4. CKD stage 3: Creatinine 1.66 => 1.5. this is near his baseline (1.4-1.6).  Follow with diuresis.  5. Polycythemia: Likely due to chronic hypoxemia.  6. Elevated troponin: Mild elevation, no trend.  Had cath in 2022 with minimal nonobstructive CAD.  - Continue atorvastatin .  7. Atrial fibrillation: Paroxysmal.  He is in NSR today.  On Xarelto  at home.  - Continue heparin  gtt while NPO.  8. DM2: SSI.   CRITICAL CARE Performed by: Ezra Shuck  Total critical care time: 35 minutes  Critical care time was exclusive of separately billable procedures and treating other patients.  Critical care was necessary to treat or prevent imminent or life-threatening deterioration.  Critical care was time spent personally by me on the following activities: development of treatment plan with patient and/or surrogate as well as nursing, discussions with consultants, evaluation of patient's response to treatment, examination of patient, obtaining history from patient or surrogate, ordering and performing treatments and interventions, ordering and review of laboratory studies, ordering and review of radiographic studies, pulse oximetry and re-evaluation of patient's condition.  Ezra Shuck 03/23/2023 7:51 AM

## 2023-03-23 NOTE — Plan of Care (Signed)

## 2023-03-23 NOTE — Progress Notes (Signed)
 Patient on ventilator, with stable pressures. Following commands with purposeful movement with all extremities.   Updated given to family on phone.     Pressure support restarted, NP aware. Increase in BP and HR.

## 2023-03-24 DIAGNOSIS — J9601 Acute respiratory failure with hypoxia: Secondary | ICD-10-CM

## 2023-03-24 DIAGNOSIS — I272 Pulmonary hypertension, unspecified: Secondary | ICD-10-CM

## 2023-03-24 DIAGNOSIS — I5033 Acute on chronic diastolic (congestive) heart failure: Secondary | ICD-10-CM | POA: Diagnosis not present

## 2023-03-24 DIAGNOSIS — J9602 Acute respiratory failure with hypercapnia: Secondary | ICD-10-CM | POA: Diagnosis not present

## 2023-03-24 DIAGNOSIS — N179 Acute kidney failure, unspecified: Secondary | ICD-10-CM | POA: Diagnosis not present

## 2023-03-24 LAB — BLOOD GAS, ARTERIAL
Acid-Base Excess: 12.7 mmol/L — ABNORMAL HIGH (ref 0.0–2.0)
Bicarbonate: 38.6 mmol/L — ABNORMAL HIGH (ref 20.0–28.0)
FIO2: 40 %
MECHVT: 500 mL
Mechanical Rate: 20
O2 Saturation: 92.7 %
PEEP: 10 cmH2O
Patient temperature: 37
pCO2 arterial: 53 mm[Hg] — ABNORMAL HIGH (ref 32–48)
pH, Arterial: 7.47 — ABNORMAL HIGH (ref 7.35–7.45)
pO2, Arterial: 64 mm[Hg] — ABNORMAL LOW (ref 83–108)

## 2023-03-24 LAB — CBC
HCT: 57 % — ABNORMAL HIGH (ref 39.0–52.0)
Hemoglobin: 18.5 g/dL — ABNORMAL HIGH (ref 13.0–17.0)
MCH: 29 pg (ref 26.0–34.0)
MCHC: 32.5 g/dL (ref 30.0–36.0)
MCV: 89.2 fL (ref 80.0–100.0)
Platelets: 181 10*3/uL (ref 150–400)
RBC: 6.39 MIL/uL — ABNORMAL HIGH (ref 4.22–5.81)
RDW: 20.5 % — ABNORMAL HIGH (ref 11.5–15.5)
WBC: 4.3 10*3/uL (ref 4.0–10.5)
nRBC: 0.5 % — ABNORMAL HIGH (ref 0.0–0.2)

## 2023-03-24 LAB — RENAL FUNCTION PANEL
Albumin: 2.8 g/dL — ABNORMAL LOW (ref 3.5–5.0)
Anion gap: 14 (ref 5–15)
BUN: 26 mg/dL — ABNORMAL HIGH (ref 6–20)
CO2: 32 mmol/L (ref 22–32)
Calcium: 8.6 mg/dL — ABNORMAL LOW (ref 8.9–10.3)
Chloride: 97 mmol/L — ABNORMAL LOW (ref 98–111)
Creatinine, Ser: 1.34 mg/dL — ABNORMAL HIGH (ref 0.61–1.24)
GFR, Estimated: >60 mL/min (ref >60)
Glucose, Bld: 142 mg/dL — ABNORMAL HIGH (ref 70–99)
Phosphorus: 4.6 mg/dL (ref 2.5–4.6)
Potassium: 4.1 mmol/L (ref 3.5–5.1)
Sodium: 143 mmol/L (ref 135–145)

## 2023-03-24 LAB — BASIC METABOLIC PANEL
Anion gap: 13 (ref 5–15)
BUN: 28 mg/dL — ABNORMAL HIGH (ref 6–20)
CO2: 30 mmol/L (ref 22–32)
Calcium: 8 mg/dL — ABNORMAL LOW (ref 8.9–10.3)
Chloride: 101 mmol/L (ref 98–111)
Creatinine, Ser: 1.36 mg/dL — ABNORMAL HIGH (ref 0.61–1.24)
GFR, Estimated: >60 mL/min (ref >60)
Glucose, Bld: 103 mg/dL — ABNORMAL HIGH (ref 70–99)
Potassium: 4 mmol/L (ref 3.5–5.1)
Sodium: 144 mmol/L (ref 135–145)

## 2023-03-24 LAB — COOXEMETRY PANEL
Carboxyhemoglobin: 1.8 % — ABNORMAL HIGH (ref 0.5–1.5)
Methemoglobin: 0.7 % (ref 0.0–1.5)
O2 Saturation: 78.5 %
Total hemoglobin: 18.4 g/dL — ABNORMAL HIGH (ref 12.0–16.0)
Total oxygen content: 76.5 %

## 2023-03-24 LAB — MAGNESIUM: Magnesium: 2.2 mg/dL (ref 1.7–2.4)

## 2023-03-24 LAB — GLUCOSE, CAPILLARY
Glucose-Capillary: 178 mg/dL — ABNORMAL HIGH (ref 70–99)
Glucose-Capillary: 124 mg/dL — ABNORMAL HIGH (ref 70–99)
Glucose-Capillary: 153 mg/dL — ABNORMAL HIGH (ref 70–99)
Glucose-Capillary: 128 mg/dL — ABNORMAL HIGH (ref 70–99)
Glucose-Capillary: 128 mg/dL — ABNORMAL HIGH (ref 70–99)
Glucose-Capillary: 131 mg/dL — ABNORMAL HIGH (ref 70–99)

## 2023-03-24 LAB — CULTURE, RESPIRATORY W GRAM STAIN: Culture: NORMAL

## 2023-03-24 LAB — APTT: aPTT: 74 s — ABNORMAL HIGH (ref 24–36)

## 2023-03-24 LAB — HEPARIN LEVEL (UNFRACTIONATED): Heparin Unfractionated: 0.49 [IU]/mL (ref 0.30–0.70)

## 2023-03-24 MED ORDER — ACETAZOLAMIDE 250 MG PO TABS
250.0000 mg | ORAL_TABLET | Freq: Once | ORAL | Status: AC
  Administered 2023-03-24: 250 mg
  Filled 2023-03-24: qty 1

## 2023-03-24 MED ORDER — VITAL HIGH PROTEIN PO LIQD
1000.0000 mL | ORAL | Status: DC
  Administered 2023-03-24 – 2023-03-25 (×2): 1000 mL

## 2023-03-24 NOTE — Progress Notes (Signed)
 ANTICOAGULATION CONSULT NOTE  Pharmacy Consult for Heparin  Infusion Indication: atrial fibrillation  Patient Measurements: Weight: 122 kg (268 lb 15.4 oz) Heparin  Dosing Weight: 101.6 kg  Labs: Recent Labs    03/22/23 0419 03/22/23 1227 03/23/23 0516 03/24/23 0421  HGB 18.1*  --  18.6* 18.5*  HCT 55.6*  --  56.3* 57.0*  PLT 187  --  175 181  APTT 80* 78* 79* 74*  HEPARINUNFRC >1.10*  --  0.66 0.49  CREATININE 1.66*  --  1.50* 1.34*    Estimated Creatinine Clearance: 82.2 mL/min (A) (by C-G formula based on SCr of 1.34 mg/dL (H)).   Medical History: Past Medical History:  Diagnosis Date   Atrial flutter (HCC)    a. CHA2DS2VASc = 4-->xarelto .   CAD (coronary artery disease)    a. 04/2020 Cath: LM nl, LAD 30ost/p, LCX nl, RCA 30p/m.   Chronic combined systolic and diastolic CHF (congestive heart failure) (HCC)    a. 03/2020 Echo: EF 40-45%; b. 05/2021 Echo: EF 40-45%; c. 10/2022 Echo: EF 55-60%, no rwma, sev LVH, RVSP 23.1 mmHg.   Diabetes mellitus without complication (HCC)    Hyperlipidemia    Hypertension    NICM (nonischemic cardiomyopathy) (HCC)    Nonadherence to medication    Obesity    Sleep apnea, obstructive     Medications:  PTA Meds: Rivaroxaban  20 mg qpm, last dose 03/20/23 @ 1637  Assessment: Pt is a 48 yo male with h/o A. Fib, on Xarelto , admitted for CHF exacerbation, now found with elevated troponins likely demand ischemia, being transitioned to heparin  infusion. Cardiology consulted.  Goal of Therapy:  Heparin  level 0.3-0.7 units/ml aPTT 66-102 seconds Monitor platelets by anticoagulation protocol: Yes  0204 2123 aPTT 52, subtherapeutic 0205 0419 aPTT 80, therapeutic x 1 / HL >1.1, not correlating 0205 1227 aPTT 78, therapeutic x 2 0206 0516 aPTT 79, therapeutic x 3 / HL 0.66, correlating x 1 0207 0421 aPTT 74, therapeutic x 4 / HL 0.49, correlating x 2   Plan:  --Continue heparin  infusion at 1800 units/hr --Correlation confirmed, will  continue to follow via heparin  levels alone --HL, CBC tomorrow AM  Rankin CANDIE Dills, PharmD, [facility] 03/24/2023 5:47 AM

## 2023-03-24 NOTE — Progress Notes (Signed)
 Patient ID: Gregory Mckenzie, male   DOB: [birth year], 48 y.o.   MRN: [id number]     Advanced Heart Failure Rounding Note  Cardiologist: Lonni Hanson, MD  Chief Complaint: Heart Failure  Subjective:    Brisk diuresis again with IV lasix , I/Os net negative 5130 cc. He is back on NE 2, SBP 110s-120s.  CVP remains 13-14.   Remains intubated, FiO2 0.4. Creatinine 1.66 => 1.5 => 1.34.  He was not ready for extubation yesterday.    Objective:   Weight Range: 122 kg Body mass index is 43.41 kg/m.   Vital Signs:   Temp:  [97.9 F (36.6 C)-98.7 F (37.1 C)] 98.3 F (36.8 C) (02/07 0400) Pulse Rate:  [29-122] 39 (02/07 0600) Resp:  [6-24] 20 (02/07 0600) BP: (69-150)/(27-104) 89/52 (02/07 0600) SpO2:  [91 %-96 %] 91 % (02/07 0600) FiO2 (%):  [40 %-50 %] 40 % (02/07 0400) Weight:  [122 kg] 122 kg (02/07 0445) Last BM Date :  (PTA)  Weight change: Filed Weights   03/22/23 0431 03/23/23 0500 03/24/23 0445  Weight: (!) 181 kg 127.5 kg 122 kg    Intake/Output:   Intake/Output Summary (Last 24 hours) at 03/24/2023 0747 Last data filed at 03/24/2023 0400 Gross per 24 hour  Intake 1820.22 ml  Output 6700 ml  Net -4879.78 ml     CVP 13-14  Physical Exam   General: Intubated Neck: JVP 12-14 cm, no thyromegaly or thyroid nodule.  Lungs: Clear to auscultation bilaterally with normal respiratory effort. CV: Nondisplaced PMI.  Heart regular S1/S2, no S3/S4, no murmur.  1+ ankle edema.   Abdomen: Soft, nontender, no hepatosplenomegaly, no distention.  Skin: Intact without lesions or rashes.  Neurologic: Awake, moving extremities. . Extremities: No clubbing or cyanosis.  HEENT: Normal.   Telemetry   NSR with occasional PVCs (personally reviewed).   EKG    N/A   Labs    CBC Recent Labs    03/23/23 0516 03/24/23 0421  WBC 4.0 4.3  HGB 18.6* 18.5*  HCT 56.3* 57.0*  MCV 87.7 89.2  PLT 175 181   Basic Metabolic Panel Recent Labs    97/93/74 0516 03/23/23 1638  03/23/23 1921 03/24/23 0421  NA 142  --   --  143  K 3.7  --  4.3 4.1  CL 97*  --   --  97*  CO2 33*  --   --  32  GLUCOSE 112*  --   --  142*  BUN 25*  --   --  26*  CREATININE 1.50*  --   --  1.34*  CALCIUM  8.8*  --   --  8.6*  MG 2.0 2.2  --  2.2  PHOS 3.3  3.3 4.8*  --  4.6   Liver Function Tests Recent Labs    03/23/23 0516 03/24/23 0421  ALBUMIN 2.6* 2.8*   No results for input(s): LIPASE, AMYLASE in the last 72 hours. Cardiac Enzymes No results for input(s): CKTOTAL, CKMB, CKMBINDEX, TROPONINI in the last 72 hours.  BNP: BNP (last 3 results) Recent Labs    03/07/23 1207 03/09/23 0957 03/20/23 1046  BNP 2,259.3* 1,857.9* 1,596.6*    ProBNP (last 3 results) No results for input(s): PROBNP in the last 8760 hours.   D-Dimer No results for input(s): DDIMER in the last 72 hours. Hemoglobin A1C No results for input(s): HGBA1C in the last 72 hours. Fasting Lipid Panel Recent Labs    03/22/23 0419  TRIG 63  Thyroid Function Tests No results for input(s): TSH, T4TOTAL, T3FREE, THYROIDAB in the last 72 hours.  Invalid input(s): FREET3  Other results:   Imaging    No results found.    Medications:     Scheduled Medications:  acetaZOLAMIDE   250 mg Per Tube Once   atorvastatin   80 mg Oral QHS   Chlorhexidine  Gluconate Cloth  6 each Topical Daily   docusate  100 mg Oral BID   feeding supplement (PROSource TF20)  60 mL Per Tube Daily   feeding supplement (VITAL HIGH PROTEIN)  1,000 mL Per Tube Q24H   free water   30 mL Per Tube Q4H   furosemide   80 mg Intravenous BID   insulin  aspart  2-6 Units Subcutaneous Q4H   ipratropium-albuterol   3 mL Nebulization Q6H   mouth rinse  15 mL Mouth Rinse Q2H   pantoprazole  (PROTONIX ) IV  40 mg Intravenous Q24H   polyethylene glycol  17 g Oral Daily   sodium chloride  flush  10-40 mL Intracatheter Q12H   spironolactone   12.5 mg Oral Daily    Infusions:  cefTRIAXone  (ROCEPHIN )   IV Stopped (03/23/23 9062)   fentaNYL  infusion INTRAVENOUS 75 mcg/hr (03/24/23 0005)   heparin  1,800 Units/hr (03/24/23 0049)   norepinephrine  (LEVOPHED ) Adult infusion 2 mcg/min (03/24/23 0005)   propofol  (DIPRIVAN ) infusion 25 mcg/kg/min (03/24/23 0611)    PRN Medications: albuterol , fentaNYL , ondansetron  **OR** ondansetron  (ZOFRAN ) IV, mouth rinse, sodium chloride  flush    Patient Profile  [name] is 46 uyear old with history HFpEF, DMII, HLD, OSA, HTN, PAF, chronic hypoxic respiratory failure on 2-4 liters, and polycythemia. 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 Family history of HTN Dad/Brother. Brother also had PE.   Admitted with A/C Hypoxic Respiratory Failure and A/C HFpEF Assessment/Plan   1. Acute on chronic HF with mid-range EF: Significant RV dysfunction and likely significant pulmonary hypertension in the setting of OHS/OSA. Echo this admission showed EF 45-50%, severe LVH, moderate RV enlargement and moderate RV dysfunction, PASP 44 mmHg. Still volume overloaded with CVP 13-14, good UOP again with IV Lasix .  - Continue Lasix  80 mg IV bid today, replace K.  - Can continue spironolactone .  - Give dose of acetazolamide  250 x 1.    - Follow CVP on PICC line.  - After extubation and further diuresis, consider RHC to assess filling pressures/PA pressure.  2. Hypotension/shock: Suspect this is due to combination of RV failure and sedation with intubation.  He is on NE 2 currently, think we should be able to wean off.  3. Acute hypoxemic/hypercarbic respiratory failure: In setting of OHS/OSA (on home oxygen 3-4 L, has not had CPAP for 6 months) and CHF/volume overload.  Now intubated, respiratory mechanics improving with diuresis. FiO2 0.4.  - Continue Lasix  80 mg IV bid with acetazolamide , needs more fluid off.   - Vent per CCM, hopefully extubate soon.  - Will need to get back on CPAP at home.  4. CKD stage 3: Creatinine 1.66 => 1.5 => 1.34. this is near his baseline  (1.4-1.6).  Follow with diuresis.  5. Polycythemia: Likely due to chronic hypoxemia.  6. Elevated troponin: Mild elevation, no trend.  Had cath in 2022 with minimal nonobstructive CAD.  - Continue atorvastatin .  7. Atrial fibrillation: Paroxysmal.  He is in NSR today.  On Xarelto  at home.  - Continue heparin  gtt while intubated.  8. DM2: SSI.   CRITICAL CARE Performed by: Ezra Shuck  Total critical care time: 35 minutes  Critical  care time was exclusive of separately billable procedures and treating other patients.  Critical care was necessary to treat or prevent imminent or life-threatening deterioration.  Critical care was time spent personally by me on the following activities: development of treatment plan with patient and/or surrogate as well as nursing, discussions with consultants, evaluation of patient's response to treatment, examination of patient, obtaining history from patient or surrogate, ordering and performing treatments and interventions, ordering and review of laboratory studies, ordering and review of radiographic studies, pulse oximetry and re-evaluation of patient's condition.  Ezra Shuck 03/24/2023 7:47 AM

## 2023-03-24 NOTE — Progress Notes (Signed)
   03/24/23 1030  Spiritual Encounters  Type of Visit Initial  Care provided to: Patient  Referral source Chaplain assessment  Reason for visit Routine spiritual support  OnCall Visit No  Spiritual Framework  Presenting Themes Other (comment) (Pt unable to respond; however, I prayed silently for him)  Interventions  Spiritual Care Interventions Made Prayer  Intervention Outcomes  Outcomes Other (comment) (Compassionate presence)

## 2023-03-24 NOTE — Plan of Care (Signed)
  Problem: Education: Goal: Ability to describe self-care measures that may prevent or decrease complications (Diabetes Survival Skills Education) will improve Outcome: Not Progressing   Problem: Coping: Goal: Ability to adjust to condition or change in health will improve Outcome: Progressing   Problem: Fluid Volume: Goal: Ability to maintain a balanced intake and output will improve Outcome: Progressing   Problem: Metabolic: Goal: Ability to maintain appropriate glucose levels will improve Outcome: Progressing   Problem: Nutritional: Goal: Maintenance of adequate nutrition will improve Outcome: Progressing   Problem: Skin Integrity: Goal: Risk for impaired skin integrity will decrease Outcome: Progressing   Problem: Tissue Perfusion: Goal: Adequacy of tissue perfusion will improve Outcome: Progressing   Problem: Clinical Measurements: Goal: Ability to maintain clinical measurements within normal limits will improve Outcome: Progressing

## 2023-03-24 NOTE — Discharge Instructions (Signed)
 Some PCP options in [place] area- not a comprehensive list  [facility]- [phone] [facility]- [phone] [facility]- [phone] [facility]- [phone] [facility]- [phone] Nichole Molly- [phone]  or [facility] Physician Referral Line [phone]

## 2023-03-24 NOTE — TOC CM/SW Note (Signed)
 Transition of Care [facility]) - Inpatient Brief Assessment   Patient Details  Name: Gregory Mckenzie MRN: [id number] Date of Birth: 12/03/1975  [facility]) CM/SW Contact:    Lauraine JAYSON Carpen, LCSW Phone Number: 03/24/2023, 3:07 PM   Clinical Narrative: CSW reviewed chart. Patient is currently intubated. Will complete readmission prevention screen when appropriate. Per chart review, patient's bipap/cpap has not been working for the past 6 months. It appears the device was ordered through [employer]. CSW notified their liaison so he can coordinate repair or replacement.   Transition of Care Asessment: Insurance and Status: Insurance coverage has been reviewed Patient has primary care physician: No Home environment has been reviewed: Single family home Prior level of function:: Single family home Prior/Current Home Services: No current home services Social Drivers of Health Review: SDOH reviewed no interventions necessary Readmission risk has been reviewed: Yes Transition of care needs: transition of care needs identified, TOC will continue to follow

## 2023-03-24 NOTE — Plan of Care (Signed)

## 2023-03-24 NOTE — Progress Notes (Signed)
 NAME:  Gregory Mckenzie, MRN:  [id number], DOB:  02/11/1976, LOS: 4 ADMISSION DATE:  03/20/2023, CHIEF COMPLAINT:  Respiratory Failure   History of Present Illness:   48 y.o morbidly obese male with significant PMH of chronic combined systolic and diastolic CHF, atrial fibrillation/flutter, T2DM, HLD, HTN, OSA not on CPAP, obesity hypoventilation syndrome, former smoker, and polycythemia who presented to the ED from the heart failure clinic with chief complaints of acute respiratory distress.   Per ED reports patient was seen in heart failure clinic for evaluation of shortness of breath with very little exertion, fatigue, worsening abdominal distention, cough, worsening pedal edema and weight gain.  He ran out of his oxygen last night around 9 PM.  When he was noted with sats in the lower 60s on room air she was placed on 3L which was increased to 5 L with only improvement up to 83%.  Patient was sent to the ED for further evaluation   ED Course: Initial vital signs showed HR of 86 beats/minute, BP 153/130 mm Hg, the RR 20 breaths/minute, and the oxygen saturation 84% on 5L and a temperature of 98.84F (37.1C).  Pertinent Labs/Diagnostics Findings: Na+/ K+: 140/4.8.  Glucose: 147.  BUN/Cr.:  44/2.19  WBC: 4.2 K/L COVID PCR: Negative,  troponin: 122~152~121  BNP:1596.6   VBG: pO2 47; pCO2 102; pH 7.19;  HCO3 39.0, %O2 Sat 58.6.  CXR> Cardiomegaly, vascular congestion  Medication administered in the ED: Disposition: Patient was given IV Lasix , placed on BiPAP and admitted to hospitalist service.  He however continued declined to wear his BiPAP.  He was given sedation with no improvement.  While still holding in the ED he was noted to be more lethargic and and less responsive therefore he was intubated for airway protection.  PCCM consulted  Pertinent  Medical History  chronic combined systolic and diastolic CHF, atrial fibrillation/flutter, T2DM, HLD, HTN, OSA not on CPAP, obesity  hypoventilation syndrome, former smoker, and polycythemia     Significant Hospital Events: Including procedures, antibiotic start and stop dates in addition to other pertinent events   2/3: Admitted to hospitalist service with acute on chronic hypoxic hypercapnic respiratory failure in the setting of CHF exacerbation. Failed BiPAP requiring intubation.  PCCM consulted 2/4: remains intubated, on diuresis, PICC line placed 2/5: aggressively diuresed, improved oxygen requirements 2/6: remains intubated, Co-ox 67% this AM. Kidney function improved 2/7: intubated, follows commands, I/O -22L, kidney function improved  Objective   Blood pressure 100/67, pulse 81, temperature 98.3 F (36.8 C), temperature source Oral, resp. rate 20, weight 122 kg, SpO2 92%. CVP:  [7 mmHg-17 mmHg] 14 mmHg  Vent Mode: PRVC FiO2 (%):  [40 %-50 %] 40 % Set Rate:  [20 bmp-24 bmp] 20 bmp Vt Set:  [500 mL] 500 mL PEEP:  [10 cmH20] 10 cmH20 Plateau Pressure:  [26 cmH20-29 cmH20] 26 cmH20   Intake/Output Summary (Last 24 hours) at 03/24/2023 0616 Last data filed at 03/24/2023 0400 Gross per 24 hour  Intake 1820.22 ml  Output 6950 ml  Net -5129.78 ml   Filed Weights   03/22/23 0431 03/23/23 0500 03/24/23 0445  Weight: (!) 181 kg 127.5 kg 122 kg    Examination: Physical Exam Constitutional:      Appearance: He is obese. He is ill-appearing.  Cardiovascular:     Rate and Rhythm: Normal rate and regular rhythm.     Pulses: Normal pulses.     Heart sounds: Normal heart sounds.  Pulmonary:  Effort: Pulmonary effort is normal.     Breath sounds: Normal breath sounds. No wheezing.  Musculoskeletal:     Right lower leg: Edema present.     Left lower leg: Edema present.  Neurological:     Mental Status: He is disoriented.     Assessment & Plan:   #Acute on Chronic Hypoxic and Hypercapnic Respiratory Failure #Acute on Chronic Decompensated HFpEF #Pulmonary Hypertension #RV Dysfunction #Pulmonary  Edema #OHS  OSA #AKI on CKD #Toxic Metabolic Encephalopathy  CO2 Narcosis #T2DM  Neuro - encephalopathy secondary to CO2 narcosis. Intubated for respiratory failure and encephalopathy, now on propofol  and fentanyl  for analgosedation, goal RASS -1. Respiratory failure continues to be barrier for extubation. CV - decompensated heart failure with significant volume overload. Echo with biventricular failure with signs of pulmonary hypertension and RV dysfunction. On IV diuresis with 80 of furosemide  IV bid, now -22 L and continues to be volume overloaded. Will check repeat co-ox this AM. Advanced heart failure following. Afib rate controlled. Pulm - hypoxic and hypercapnic respiratory failure secondary to a combination of decompensated heart failure, pulmonary hypertension (groups two and three) and OHS. Improved hypercapnia and respiratory acidosis with mechanical ventilation. Optimized PEEP and weaned down FiO2, now at 10 of PEEP. Overall respiratory failure improving with aggressive diuresis. He was in the process of getting evaluation for complex sleep disorder prior to admission and will need NIPPV moving forward. Renal - initiated on furosemide  for diuresis, at 80 mg IV bid. AKI potentially cardiorenal on top of CKD especially and continues to improve with aggressive diuresis. Electrolytes stable after previous episode of hyperkalemia.  Endo - ICU glycemic protocol Hem/Onc - heparin  gtt for afib, on rivaroxaban  at home GI - NG tube placed, will initiate tube feeds. PPI for SUP. ID - afebrile but respiratory culture gram stain with gram positive cocci. Continue antibiotics with Ceftriaxone .  Best Practice (right click and Reselect all SmartList Selections daily)   Diet/type: tubefeeds DVT prophylaxis systemic heparin  Pressure ulcer(s): N/A GI prophylaxis: PPI Lines: Central line and yes and it is still needed Foley:  Yes, and it is still needed Code Status:  full code Last date of  multidisciplinary goals of care discussion [03/24/2023]  Labs   CBC: Recent Labs  Lab 03/20/23 1046 03/21/23 0246 03/22/23 0419 03/23/23 0516 03/24/23 0421  WBC 4.2 3.9* 7.1 4.0 4.3  HGB 18.9* 19.1* 18.1* 18.6* 18.5*  HCT 61.2* 59.9* 55.6* 56.3* 57.0*  MCV 93.7 91.7 88.4 87.7 89.2  PLT 175 193 187 175 181    Basic Metabolic Panel: Recent Labs  Lab 03/21/23 0246 03/21/23 0251 03/21/23 1351 03/22/23 0012 03/22/23 0419 03/22/23 1227 03/22/23 1709 03/22/23 2326 03/23/23 0516 03/23/23 1638 03/23/23 1921 03/24/23 0421  NA 142  --  142  --  142  --   --   --  142  --   --  143  K 4.2  --  4.4   < > 3.9   < > 3.6 3.1* 3.7  --  4.3 4.1  CL 98  --  97*  --  98  --   --   --  97*  --   --  97*  CO2 26  --  30  --  33*  --   --   --  33*  --   --  32  GLUCOSE 125*  --  130*  --  113*  --   --   --  112*  --   --  142*  BUN 42*  --  37*  --  32*  --   --   --  25*  --   --  26*  CREATININE 1.73*  --  1.66*  --  1.66*  --   --   --  1.50*  --   --  1.34*  CALCIUM  8.9  --  8.8*  --  8.9  --   --   --  8.8*  --   --  8.6*  MG  --    < >  --   --  2.0  --  2.1  --  2.0 2.2  --  2.2  PHOS  --   --   --   --  2.8  --  2.8  --  3.3  3.3 4.8*  --  4.6   < > = values in this interval not displayed.   GFR: Estimated Creatinine Clearance: 82.2 mL/min (A) (by C-G formula based on SCr of 1.34 mg/dL (H)). Recent Labs  Lab 03/21/23 0246 03/21/23 0636 03/22/23 0419 03/22/23 1252 03/23/23 0516 03/24/23 0421  WBC 3.9*  --  7.1  --  4.0 4.3  LATICACIDVEN 1.4 1.4  --  1.7  --   --     Liver Function Tests: Recent Labs  Lab 03/21/23 0246 03/22/23 0419 03/23/23 0516 03/24/23 0421  AST 20  --   --   --   ALT 30  --   --   --   ALKPHOS 165*  --   --   --   BILITOT 2.5*  --   --   --   PROT 6.6  --   --   --   ALBUMIN 3.3* 2.6* 2.6* 2.8*   No results for input(s): LIPASE, AMYLASE in the last 168 hours. No results for input(s): AMMONIA in the last 168 hours.  ABG     Component Value Date/Time   PHART 7.47 (H) 03/24/2023 0500   PCO2ART 53 (H) 03/24/2023 0500   PO2ART 64 (L) 03/24/2023 0500   HCO3 38.6 (H) 03/24/2023 0500   TCO2 31 05/08/2020 1324   TCO2 32 05/08/2020 1324   O2SAT 92.7 03/24/2023 0500     Coagulation Profile: Recent Labs  Lab 03/20/23 1046  INR 2.0*    Cardiac Enzymes: No results for input(s): CKTOTAL, CKMB, CKMBINDEX, TROPONINI in the last 168 hours.  HbA1C: Hgb A1c MFr Bld  Date/Time Value Ref Range Status  09/28/2022 11:34 AM 7.0 (H) 4.8 - 5.6 % Final    Comment:             Prediabetes: 5.7 - 6.4          Diabetes: >6.4          Glycemic control for adults with diabetes: <7.0   12/21/2021 11:08 AM 6.6 (H) 4.8 - 5.6 % Final    Comment:    (NOTE) Pre diabetes:          5.7%-6.4%  Diabetes:              >6.4%  Glycemic control for   <7.0% adults with diabetes     CBG: Recent Labs  Lab 03/23/23 1129 03/23/23 1536 03/23/23 1943 03/23/23 2354 03/24/23 0425  GLUCAP 116* 122* 134* 112* 178*    Review of Systems:   N/A  Past Medical History:  He,  has a past medical history of Atrial flutter (HCC), CAD (coronary artery disease), Chronic combined systolic and diastolic CHF (  congestive heart failure) (HCC), Diabetes mellitus without complication (HCC), Hyperlipidemia, Hypertension, NICM (nonischemic cardiomyopathy) (HCC), Nonadherence to medication, Obesity, and Sleep apnea, obstructive.   Surgical History:   Past Surgical History:  Procedure Laterality Date   RIGHT/LEFT HEART CATH AND CORONARY ANGIOGRAPHY N/A 05/08/2020   Procedure: RIGHT/LEFT HEART CATH AND CORONARY ANGIOGRAPHY;  Surgeon: Rolan Ezra RAMAN, MD;  Location: [facility] INVASIVE CV LAB;  Service: Cardiovascular;  Laterality: N/A;     Social History:   reports that he quit smoking about 2 years ago. His smoking use included cigarettes. He started smoking about 29 years ago. He has a 27 pack-year smoking history. He has never used smokeless  tobacco. He reports that he does not currently use alcohol . He reports current drug use. Frequency: 7.00 times per week. Drug: Marijuana.   Family History:  His family history includes Hypertension in his brother and father; Pulmonary embolism in his brother.   Allergies No Known Allergies   Home Medications  Prior to Admission medications   Medication Sig Start Date End Date Taking? Authorizing Provider  atorvastatin  (LIPITOR ) 40 MG tablet Take 2 tablets (80 mg total) by mouth at bedtime. 03/16/23  Yes Donette Ellouise LABOR, FNP  carvedilol  (COREG ) 6.25 MG tablet Take 1 tablet (6.25 mg total) by mouth 2 (two) times daily with a meal. 09/20/22  Yes Donette Ellouise A, FNP  empagliflozin  (JARDIANCE ) 10 MG TABS tablet Take 1 tablet (10 mg total) by mouth daily before breakfast. 09/21/22  Yes Donette Ellouise A, FNP  rivaroxaban  (XARELTO ) 20 MG TABS tablet Take 1 tablet (20 mg total) by mouth daily with supper. 09/20/22  Yes Hackney, Ellouise A, FNP  sacubitril -valsartan  (ENTRESTO ) 49-51 MG Take 1 tablet by mouth 2 (two) times daily. 03/07/23  Yes Hackney, Ellouise A, FNP  spironolactone  (ALDACTONE ) 25 MG tablet Take 1 tablet (25 mg total) by mouth once daily. 09/20/22  Yes Hackney, Tina A, FNP  torsemide  (DEMADEX ) 20 MG tablet Take 2 tablets (40 mg total) by mouth daily. 03/07/23  Yes Donette Ellouise LABOR, FNP  empagliflozin  (JARDIANCE ) 10 MG TABS tablet Take 1 tablet (10 mg total) by mouth daily before breakfast. Patient not taking: Reported on 03/20/2023 03/07/23   Donette Ellouise A, FNP  OXYGEN Inhale 4 L into the lungs at bedtime.    [provider]     Critical care time: 36 minutes    Belva November, MD [facility] 03/24/2023 4:05 PM

## 2023-03-25 ENCOUNTER — Other Ambulatory Visit: Payer: Self-pay

## 2023-03-25 DIAGNOSIS — I272 Pulmonary hypertension, unspecified: Secondary | ICD-10-CM | POA: Diagnosis not present

## 2023-03-25 DIAGNOSIS — R570 Cardiogenic shock: Secondary | ICD-10-CM

## 2023-03-25 DIAGNOSIS — I480 Paroxysmal atrial fibrillation: Secondary | ICD-10-CM | POA: Diagnosis not present

## 2023-03-25 DIAGNOSIS — I509 Heart failure, unspecified: Secondary | ICD-10-CM

## 2023-03-25 DIAGNOSIS — J9601 Acute respiratory failure with hypoxia: Secondary | ICD-10-CM | POA: Diagnosis not present

## 2023-03-25 DIAGNOSIS — I5031 Acute diastolic (congestive) heart failure: Secondary | ICD-10-CM | POA: Diagnosis not present

## 2023-03-25 DIAGNOSIS — J9602 Acute respiratory failure with hypercapnia: Secondary | ICD-10-CM | POA: Diagnosis not present

## 2023-03-25 DIAGNOSIS — N179 Acute kidney failure, unspecified: Secondary | ICD-10-CM | POA: Diagnosis not present

## 2023-03-25 LAB — GLUCOSE, CAPILLARY
Glucose-Capillary: 108 mg/dL — ABNORMAL HIGH (ref 70–99)
Glucose-Capillary: 114 mg/dL — ABNORMAL HIGH (ref 70–99)
Glucose-Capillary: 137 mg/dL — ABNORMAL HIGH (ref 70–99)
Glucose-Capillary: 141 mg/dL — ABNORMAL HIGH (ref 70–99)
Glucose-Capillary: 152 mg/dL — ABNORMAL HIGH (ref 70–99)
Glucose-Capillary: 163 mg/dL — ABNORMAL HIGH (ref 70–99)

## 2023-03-25 LAB — BASIC METABOLIC PANEL
Anion gap: 12 (ref 5–15)
Anion gap: 13 (ref 5–15)
BUN: 30 mg/dL — ABNORMAL HIGH (ref 6–20)
BUN: 34 mg/dL — ABNORMAL HIGH (ref 6–20)
CO2: 28 mmol/L (ref 22–32)
CO2: 30 mmol/L (ref 22–32)
Calcium: 8.4 mg/dL — ABNORMAL LOW (ref 8.9–10.3)
Calcium: 9.1 mg/dL (ref 8.9–10.3)
Chloride: 97 mmol/L — ABNORMAL LOW (ref 98–111)
Chloride: 99 mmol/L (ref 98–111)
Creatinine, Ser: 1.65 mg/dL — ABNORMAL HIGH (ref 0.61–1.24)
Creatinine, Ser: 1.63 mg/dL — ABNORMAL HIGH (ref 0.61–1.24)
GFR, Estimated: 51 mL/min — ABNORMAL LOW (ref >60)
GFR, Estimated: 51 mL/min — ABNORMAL LOW (ref >60)
Glucose, Bld: 105 mg/dL — ABNORMAL HIGH (ref 70–99)
Glucose, Bld: 120 mg/dL — ABNORMAL HIGH (ref 70–99)
Potassium: 4.2 mmol/L (ref 3.5–5.1)
Potassium: 4.6 mmol/L (ref 3.5–5.1)
Sodium: 137 mmol/L (ref 135–145)
Sodium: 142 mmol/L (ref 135–145)

## 2023-03-25 LAB — CBC
HCT: 56.7 % — ABNORMAL HIGH (ref 39.0–52.0)
Hemoglobin: 18.2 g/dL — ABNORMAL HIGH (ref 13.0–17.0)
MCH: 29.6 pg (ref 26.0–34.0)
MCHC: 32.1 g/dL (ref 30.0–36.0)
MCV: 92.3 fL (ref 80.0–100.0)
Platelets: 168 10*3/uL (ref 150–400)
RBC: 6.14 MIL/uL — ABNORMAL HIGH (ref 4.22–5.81)
RDW: 20.3 % — ABNORMAL HIGH (ref 11.5–15.5)
WBC: 4.3 10*3/uL (ref 4.0–10.5)
nRBC: 0 % (ref 0.0–0.2)

## 2023-03-25 LAB — COOXEMETRY PANEL
Carboxyhemoglobin: 2.1 % — ABNORMAL HIGH (ref 0.5–1.5)
Methemoglobin: 1.1 % (ref 0.0–1.5)
O2 Saturation: 79.4 %
Total hemoglobin: 18.7 g/dL — ABNORMAL HIGH (ref 12.0–16.0)
Total oxygen content: 76.9 %

## 2023-03-25 LAB — TRIGLYCERIDES: Triglycerides: 589 mg/dL — ABNORMAL HIGH (ref <150)

## 2023-03-25 LAB — HEPARIN LEVEL (UNFRACTIONATED): Heparin Unfractionated: 0.34 [IU]/mL (ref 0.30–0.70)

## 2023-03-25 MED ORDER — MIDAZOLAM HCL 2 MG/2ML IJ SOLN
INTRAMUSCULAR | Status: AC
Start: 2023-03-25 — End: 2023-03-26
  Filled 2023-03-25: qty 2

## 2023-03-25 MED ORDER — DEXMEDETOMIDINE HCL IN NACL 400 MCG/100ML IV SOLN
0.0000 ug/kg/h | INTRAVENOUS | Status: DC
  Administered 2023-03-25 (×2): 1 ug/kg/h via INTRAVENOUS
  Administered 2023-03-25: 0.4 ug/kg/h via INTRAVENOUS
  Administered 2023-03-25: 1.1 ug/kg/h via INTRAVENOUS
  Administered 2023-03-25: 1.2 ug/kg/h via INTRAVENOUS
  Administered 2023-03-25: 1 ug/kg/h via INTRAVENOUS
  Administered 2023-03-26 – 2023-03-28 (×19): 1.2 ug/kg/h via INTRAVENOUS
  Filled 2023-03-25 (×24): qty 100

## 2023-03-25 MED ORDER — FUROSEMIDE 10 MG/ML IJ SOLN
80.0000 mg | Freq: Two times a day (BID) | INTRAMUSCULAR | Status: DC
  Administered 2023-03-25 – 2023-03-27 (×6): 80 mg via INTRAVENOUS
  Filled 2023-03-25 (×6): qty 8

## 2023-03-25 MED ORDER — ACETAMINOPHEN 325 MG PO TABS
975.0000 mg | ORAL_TABLET | Freq: Four times a day (QID) | ORAL | Status: DC | PRN
  Administered 2023-03-25 – 2023-03-27 (×6): 975 mg via ORAL
  Filled 2023-03-25 (×6): qty 3

## 2023-03-25 MED ORDER — BISACODYL 10 MG RE SUPP
10.0000 mg | Freq: Every day | RECTAL | Status: DC | PRN
  Administered 2023-03-25 – 2023-03-27 (×2): 10 mg via RECTAL
  Filled 2023-03-25: qty 1

## 2023-03-25 MED ORDER — MIDAZOLAM HCL 2 MG/2ML IJ SOLN
2.0000 mg | INTRAMUSCULAR | Status: DC | PRN
  Administered 2023-03-25 – 2023-03-28 (×12): 2 mg via INTRAVENOUS
  Filled 2023-03-25 (×11): qty 2

## 2023-03-25 NOTE — Progress Notes (Addendum)
 Patient ID: Gregory Mckenzie, male   DOB: [birth year], 48 y.o.   MRN: [id number]    Rounding Note  Cardiologist: Lonni Hanson, MD  Chief Complaint: Heart Failure  Subjective:    Good diuresis overnight.  He put out 3.8 L yesterday and is net -23.4 L since admission.  Remains intubated on FiO2 40%.  Co. ox 76.9 CVP 12 Creatinine 1.66 => 1.5 => 1.34=> 1.65.    Objective:   Weight Range: 122 kg Body mass index is 43.41 kg/m.   Vital Signs:   Temp:  [98.8 F (37.1 C)-99.8 F (37.7 C)] 99.8 F (37.7 C) (02/08 0800) Pulse Rate:  [87-111] 88 (02/08 0800) Resp:  [19-20] 20 (02/08 0800) BP: (76-103)/(52-73) 90/55 (02/08 0800) SpO2:  [89 %-94 %] 94 % (02/08 0834) FiO2 (%):  [40 %] 40 % (02/08 0834) Weight:  [122 kg] 122 kg (02/08 0451) Last BM Date :  (pta)  Weight change: Filed Weights   03/23/23 0500 03/24/23 0445 03/25/23 0451  Weight: 127.5 kg 122 kg 122 kg    Intake/Output:   Intake/Output Summary (Last 24 hours) at 03/25/2023 0942 Last data filed at 03/25/2023 0747 Gross per 24 hour  Intake 3433.46 ml  Output 3575 ml  Net -141.54 ml     CVP 12  Physical Exam   GEN: Intubated  HEENT: Normal NECK: No JVD; No carotid bruits LYMPHATICS: No lymphadenopathy CARDIAC:RRR, no murmurs, rubs, gallops RESPIRATORY:  Clear to auscultation without rales, wheezing or rhonchi  ABDOMEN: Soft, non-tender, non-distended MUSCULOSKELETAL: Trace bilateral lower extremity edema; No deformity  SKIN: Warm and dry NEUROLOGIC: Cannot assess PSYCHIATRIC: Cannot assess Telemetry   Sinus rhythm with PVCs>> telemetry personally reviewed   EKG    N/A   Labs    CBC Recent Labs    03/24/23 0421 03/25/23 0426  WBC 4.3 4.3  HGB 18.5* 18.2*  HCT 57.0* 56.7*  MCV 89.2 92.3  PLT 181 168   Basic Metabolic Panel Recent Labs    97/93/74 1638 03/23/23 1921 03/24/23 0421 03/24/23 1739 03/25/23 0426  NA  --   --  143 144 137  K  --    < > 4.1 4.0 4.2  CL  --   --  97*  101 97*  CO2  --   --  32 30 28  GLUCOSE  --   --  142* 103* 105*  BUN  --   --  26* 28* 30*  CREATININE  --   --  1.34* 1.36* 1.65*  CALCIUM   --   --  8.6* 8.0* 8.4*  MG 2.2  --  2.2  --   --   PHOS 4.8*  --  4.6  --   --    < > = values in this interval not displayed.   Liver Function Tests Recent Labs    03/23/23 0516 03/24/23 0421  ALBUMIN 2.6* 2.8*   No results for input(s): LIPASE, AMYLASE in the last 72 hours. Cardiac Enzymes No results for input(s): CKTOTAL, CKMB, CKMBINDEX, TROPONINI in the last 72 hours.  BNP: BNP (last 3 results) Recent Labs    03/07/23 1207 03/09/23 0957 03/20/23 1046  BNP 2,259.3* 1,857.9* 1,596.6*    ProBNP (last 3 results) No results for input(s): PROBNP in the last 8760 hours.   D-Dimer No results for input(s): DDIMER in the last 72 hours. Hemoglobin A1C No results for input(s): HGBA1C in the last 72 hours. Fasting Lipid Panel Recent Labs    03/25/23  0426  TRIG 589*   Thyroid Function Tests No results for input(s): TSH, T4TOTAL, T3FREE, THYROIDAB in the last 72 hours.  Invalid input(s): FREET3  Other results:   Imaging    No results found.    Medications:     Scheduled Medications:  atorvastatin   80 mg Oral QHS   Chlorhexidine  Gluconate Cloth  6 each Topical Daily   docusate  100 mg Oral BID   feeding supplement (PROSource TF20)  60 mL Per Tube Daily   free water   30 mL Per Tube Q4H   insulin  aspart  2-6 Units Subcutaneous Q4H   ipratropium-albuterol   3 mL Nebulization Q6H   mouth rinse  15 mL Mouth Rinse Q2H   pantoprazole  (PROTONIX ) IV  40 mg Intravenous Q24H   polyethylene glycol  17 g Oral Daily   sodium chloride  flush  10-40 mL Intracatheter Q12H   spironolactone   12.5 mg Oral Daily    Infusions:  cefTRIAXone  (ROCEPHIN )  IV Stopped (03/24/23 0956)   feeding supplement (VITAL HIGH PROTEIN) 60 mL/hr at 03/25/23 0453   fentaNYL  infusion INTRAVENOUS 100 mcg/hr (03/25/23 0453)    heparin  1,800 Units/hr (03/25/23 0547)   norepinephrine  (LEVOPHED ) Adult infusion Stopped (03/24/23 0909)   propofol  (DIPRIVAN ) infusion 15 mcg/kg/min (03/25/23 0453)    PRN Medications: albuterol , fentaNYL , ondansetron  **OR** ondansetron  (ZOFRAN ) IV, mouth rinse, sodium chloride  flush    Patient Profile  Gregory Mckenzie is 89 uyear old with history HFpEF, DMII, HLD, OSA, HTN, PAF, chronic hypoxic respiratory failure on 2-4 liters, and polycythemia. 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 Family history of HTN Dad/Brother. Brother also had PE.   Admitted with A/C Hypoxic Respiratory Failure and A/C HFpEF Assessment/Plan   1. Acute on chronic HF with mid-range EF: Significant RV dysfunction and likely significant pulmonary hypertension in the setting of OHS/OSA. Echo this admission showed EF 45-50%, severe LVH, moderate RV enlargement and moderate RV dysfunction, PASP 44 mmHg.  - Remains volume overloaded.  CVP 12 - I's and O's -3.8 L yesterday, net -23.4 L since admission - SCr bumped from 1.34->>1.65 today (near baseline 1.4-1.6)>> will not change diuretic dose at this time since he is still within his normal baseline range - Received a dose of acetazolamide  yesterday - CCM stopped diuresis this am due to bump in SCr despite it still in normal range for patient - discussed with Dr. Rolan with AHF and recommends continuing IV Lasix  (without acetazolamide )until CVP < 10 - restart Lasix  80mg  IV BID and continue Spiro 12.5mg  daily - Follow CVP on PICC line.  - After extubation and further diuresis, consider RHC to assess filling pressures/PA pressure.   2. Hypotension/shock: Suspect this is due to combination of RV failure and sedation with intubation.   - He is on NE 2 currently, BPs remain soft in the 80s to 90s  3. Acute hypoxemic/hypercarbic respiratory failure: In setting of OHS/OSA (on home oxygen 3-4 L, has not had CPAP for 6 months) and CHF/volume overload.  Now intubated, respiratory  mechanics improving with diuresis. FiO2 0.4.  - CCM has stopped diuretics due to bump in SCr>>restarting Lasix  until CVP<10 - Vent per CCM, hopefully extubate soon.  - Will need to get back on CPAP at home.   4. CKD stage 3: Creatinine 1.66 => 1.5 => 1.34 => 1.65 - Serum creatinine bumped to 1.65 today but still near his baseline (1.4-1.6).   - follow with diuresis  5. Polycythemia: Likely due to chronic hypoxemia.   6. Elevated troponin:  Mild elevation, no trend.  - Had cath in 2022 with minimal nonobstructive CAD.  - Continue atorvastatin .   7. Atrial fibrillation:  -this is paroxysmal and he has been maintaining sinus rhythm on telemetry  -On Xarelto  at home.  -Continue heparin  gtt while intubated.   8. DM2: SSI.   CRITICAL CARE Performed by: Wilbert Bihari  Total critical care time: 30 minutes  Critical care time was exclusive of separately billable procedures and treating other patients.  Critical care was necessary to treat or prevent imminent or life-threatening deterioration.  Critical care was time spent personally by me on the following activities: development of treatment plan with patient and/or surrogate as well as nursing, discussions with consultants, evaluation of patient's response to treatment, examination of patient, obtaining history from patient or surrogate, ordering and performing treatments and interventions, ordering and review of laboratory studies, ordering and review of radiographic studies, pulse oximetry and re-evaluation of patient's condition.  Wilbert Bihari 03/25/2023 9:42 AM

## 2023-03-25 NOTE — Progress Notes (Signed)
 NAME:  Gregory Mckenzie, MRN:  [id number], DOB:  May 13, 1975, LOS: 5 ADMISSION DATE:  03/20/2023, CHIEF COMPLAINT:  Respiratory Failure   History of Present Illness:   48 y.o morbidly obese male with significant PMH of chronic combined systolic and diastolic CHF, atrial fibrillation/flutter, T2DM, HLD, HTN, OSA not on CPAP, obesity hypoventilation syndrome, former smoker, and polycythemia who presented to the ED from the heart failure clinic with chief complaints of acute respiratory distress.   Per ED reports patient was seen in heart failure clinic for evaluation of shortness of breath with very little exertion, fatigue, worsening abdominal distention, cough, worsening pedal edema and weight gain.  He ran out of his oxygen last night around 9 PM.  When he was noted with sats in the lower 60s on room air she was placed on 3L which was increased to 5 L with only improvement up to 83%.  Patient was sent to the ED for further evaluation   ED Course: Initial vital signs showed HR of 86 beats/minute, BP 153/130 mm Hg, the RR 20 breaths/minute, and the oxygen saturation 84% on 5L and a temperature of 98.58F (37.1C).  Pertinent Labs/Diagnostics Findings: Na+/ K+: 140/4.8.  Glucose: 147.  BUN/Cr.:  44/2.19  WBC: 4.2 K/L COVID PCR: Negative,  troponin: 122~152~121  BNP:1596.6   VBG: pO2 47; pCO2 102; pH 7.19;  HCO3 39.0, %O2 Sat 58.6.  CXR> Cardiomegaly, vascular congestion  Medication administered in the ED: Disposition: Patient was given IV Lasix , placed on BiPAP and admitted to hospitalist service.  He however continued declined to wear his BiPAP.  He was given sedation with no improvement.  While still holding in the ED he was noted to be more lethargic and and less responsive therefore he was intubated for airway protection.  PCCM consulted  Pertinent  Medical History  chronic combined systolic and diastolic CHF, atrial fibrillation/flutter, T2DM, HLD, HTN, OSA not on CPAP, obesity  hypoventilation syndrome, former smoker, and polycythemia     Significant Hospital Events: Including procedures, antibiotic start and stop dates in addition to other pertinent events   2/3: Admitted to hospitalist service with acute on chronic hypoxic hypercapnic respiratory failure in the setting of CHF exacerbation. Failed BiPAP requiring intubation.  PCCM consulted 2/4: remains intubated, on diuresis, PICC line placed 2/5: aggressively diuresed, improved oxygen requirements 2/6: remains intubated, Co-ox 67% this AM. Kidney function improved 2/7: intubated, follows commands, I/O -22L, kidney function improved 2/8: creatinine bumped, follows commands off sedation, I/O -23L  Objective   Blood pressure (!) 90/55, pulse 88, temperature 99.8 F (37.7 C), temperature source Axillary, resp. rate 20, weight 122 kg, SpO2 92%. CVP:  [8 mmHg-16 mmHg] 13 mmHg  Vent Mode: PRVC FiO2 (%):  [40 %] 40 % Set Rate:  [20 bmp] 20 bmp Vt Set:  [500 mL] 500 mL PEEP:  [10 cmH20] 10 cmH20 Plateau Pressure:  [23 cmH20] 23 cmH20   Intake/Output Summary (Last 24 hours) at 03/25/2023 0824 Last data filed at 03/25/2023 0747 Gross per 24 hour  Intake 3433.46 ml  Output 3575 ml  Net -141.54 ml   Filed Weights   03/23/23 0500 03/24/23 0445 03/25/23 0451  Weight: 127.5 kg 122 kg 122 kg    Examination: Physical Exam Constitutional:      Appearance: He is obese. He is ill-appearing.  Cardiovascular:     Rate and Rhythm: Normal rate and regular rhythm.     Pulses: Normal pulses.     Heart sounds: Normal heart sounds.  Pulmonary:     Effort: Pulmonary effort is normal.     Breath sounds: Normal breath sounds. No wheezing.  Musculoskeletal:     Right lower leg: Edema present.     Left lower leg: Edema present.  Neurological:     Mental Status: He is disoriented.     Assessment & Plan:   #Acute on Chronic Hypoxic and Hypercapnic Respiratory Failure #Acute on Chronic Decompensated HFpEF #Pulmonary  Hypertension #RV Dysfunction #Pulmonary Edema #OHS  OSA #AKI on CKD #Toxic Metabolic Encephalopathy  CO2 Narcosis #T2DM  Neuro - encephalopathy secondary to CO2 narcosis. Intubated for respiratory failure and encephalopathy, now on propofol  and fentanyl  for analgosedation, goal RASS -1. Respiratory failure continues to be barrier for extubation. WUA again today and plan for extubation if passes SBT. CV - decompensated heart failure with significant volume overload. Echo with biventricular failure with signs of pulmonary hypertension and RV dysfunction. On IV diuresis with 80 of furosemide  IV bid, now -23 L with significant improvement in volume status. Improved central venous sat and CVP suggesting improvement in overall heart failure. Will hold furosemide  today. Pulm - hypoxic and hypercapnic respiratory failure secondary to a combination of decompensated heart failure, pulmonary hypertension (groups two and three) and OHS. Improved hypercapnia and respiratory acidosis with mechanical ventilation. Ventilatory requirements are much improved, will SBT today with WUA and consider extubation to BIPAP if successful. Patient will need NIPPV on discharge; he pends an evaluation for complex sleep disorder. Renal - initiated on furosemide  for diuresis, at 80 mg IV bid. AKI initially cardiorenal on top of CKD but with diuresis this improved. Creatinine is now bumping that I suspect is due to optimal diuresis. Will hold furosemide  this AM. Electrolytes stable. Endo - ICU glycemic protocol Hem/Onc - heparin  gtt for afib, on rivaroxaban  at home GI - NG tube placed, will initiate tube feeds. PPI for SUP. ID - afebrile but respiratory culture gram stain with gram positive cocci. Continue antibiotics with Ceftriaxone .  Best Practice (right click and Reselect all SmartList Selections daily)   Diet/type: tubefeeds DVT prophylaxis systemic heparin  Pressure ulcer(s): N/A GI prophylaxis: PPI Lines: Central  line and yes and it is still needed Foley:  Yes, and it is still needed Code Status:  full code Last date of multidisciplinary goals of care discussion [03/25/2023]  Labs   CBC: Recent Labs  Lab 03/21/23 0246 03/22/23 0419 03/23/23 0516 03/24/23 0421 03/25/23 0426  WBC 3.9* 7.1 4.0 4.3 4.3  HGB 19.1* 18.1* 18.6* 18.5* 18.2*  HCT 59.9* 55.6* 56.3* 57.0* 56.7*  MCV 91.7 88.4 87.7 89.2 92.3  PLT 193 187 175 181 168    Basic Metabolic Panel: Recent Labs  Lab 03/22/23 0419 03/22/23 1227 03/22/23 1709 03/22/23 2326 03/23/23 0516 03/23/23 1638 03/23/23 1921 03/24/23 0421 03/24/23 1739 03/25/23 0426  NA 142  --   --   --  142  --   --  143 144 137  K 3.9   < > 3.6   < > 3.7  --  4.3 4.1 4.0 4.2  CL 98  --   --   --  97*  --   --  97* 101 97*  CO2 33*  --   --   --  33*  --   --  32 30 28  GLUCOSE 113*  --   --   --  112*  --   --  142* 103* 105*  BUN 32*  --   --   --  25*  --   --  26* 28* 30*  CREATININE 1.66*  --   --   --  1.50*  --   --  1.34* 1.36* 1.65*  CALCIUM  8.9  --   --   --  8.8*  --   --  8.6* 8.0* 8.4*  MG 2.0  --  2.1  --  2.0 2.2  --  2.2  --   --   PHOS 2.8  --  2.8  --  3.3  3.3 4.8*  --  4.6  --   --    < > = values in this interval not displayed.   GFR: Estimated Creatinine Clearance: 66.7 mL/min (A) (by C-G formula based on SCr of 1.65 mg/dL (H)). Recent Labs  Lab 03/21/23 0246 03/21/23 0636 03/22/23 0419 03/22/23 1252 03/23/23 0516 03/24/23 0421 03/25/23 0426  WBC 3.9*  --  7.1  --  4.0 4.3 4.3  LATICACIDVEN 1.4 1.4  --  1.7  --   --   --     Liver Function Tests: Recent Labs  Lab 03/21/23 0246 03/22/23 0419 03/23/23 0516 03/24/23 0421  AST 20  --   --   --   ALT 30  --   --   --   ALKPHOS 165*  --   --   --   BILITOT 2.5*  --   --   --   PROT 6.6  --   --   --   ALBUMIN 3.3* 2.6* 2.6* 2.8*   No results for input(s): LIPASE, AMYLASE in the last 168 hours. No results for input(s): AMMONIA in the last 168 hours.  ABG     Component Value Date/Time   PHART 7.47 (H) 03/24/2023 0500   PCO2ART 53 (H) 03/24/2023 0500   PO2ART 64 (L) 03/24/2023 0500   HCO3 38.6 (H) 03/24/2023 0500   TCO2 31 05/08/2020 1324   TCO2 32 05/08/2020 1324   O2SAT 79.4 03/25/2023 0426     Coagulation Profile: Recent Labs  Lab 03/20/23 1046  INR 2.0*    Cardiac Enzymes: No results for input(s): CKTOTAL, CKMB, CKMBINDEX, TROPONINI in the last 168 hours.  HbA1C: Hgb A1c MFr Bld  Date/Time Value Ref Range Status  09/28/2022 11:34 AM 7.0 (H) 4.8 - 5.6 % Final    Comment:             Prediabetes: 5.7 - 6.4          Diabetes: >6.4          Glycemic control for adults with diabetes: <7.0   12/21/2021 11:08 AM 6.6 (H) 4.8 - 5.6 % Final    Comment:    (NOTE) Pre diabetes:          5.7%-6.4%  Diabetes:              >6.4%  Glycemic control for   <7.0% adults with diabetes     CBG: Recent Labs  Lab 03/24/23 1541 03/24/23 1938 03/24/23 2321 03/25/23 0358 03/25/23 0735  GLUCAP 128* 128* 131* 114* 108*    Review of Systems:   N/A  Past Medical History:  He,  has a past medical history of Atrial flutter (HCC), CAD (coronary artery disease), Chronic combined systolic and diastolic CHF (congestive heart failure) (HCC), Diabetes mellitus without complication (HCC), Hyperlipidemia, Hypertension, NICM (nonischemic cardiomyopathy) (HCC), Nonadherence to medication, Obesity, and Sleep apnea, obstructive.   Surgical History:   Past Surgical History:  Procedure Laterality Date  RIGHT/LEFT HEART CATH AND CORONARY ANGIOGRAPHY N/A 05/08/2020   Procedure: RIGHT/LEFT HEART CATH AND CORONARY ANGIOGRAPHY;  Surgeon: Rolan Ezra RAMAN, MD;  Location: [facility] INVASIVE CV LAB;  Service: Cardiovascular;  Laterality: N/A;     Social History:   reports that he quit smoking about 2 years ago. His smoking use included cigarettes. He started smoking about 29 years ago. He has a 27 pack-year smoking history. He has never used smokeless  tobacco. He reports that he does not currently use alcohol . He reports current drug use. Frequency: 7.00 times per week. Drug: Marijuana.   Family History:  His family history includes Hypertension in his brother and father; Pulmonary embolism in his brother.   Allergies No Known Allergies   Home Medications  Prior to Admission medications   Medication Sig Start Date End Date Taking? Authorizing Provider  atorvastatin  (LIPITOR ) 40 MG tablet Take 2 tablets (80 mg total) by mouth at bedtime. 03/16/23  Yes Donette Ellouise LABOR, FNP  carvedilol  (COREG ) 6.25 MG tablet Take 1 tablet (6.25 mg total) by mouth 2 (two) times daily with a meal. 09/20/22  Yes Donette Ellouise A, FNP  empagliflozin  (JARDIANCE ) 10 MG TABS tablet Take 1 tablet (10 mg total) by mouth daily before breakfast. 09/21/22  Yes Donette Ellouise A, FNP  rivaroxaban  (XARELTO ) 20 MG TABS tablet Take 1 tablet (20 mg total) by mouth daily with supper. 09/20/22  Yes Hackney, Ellouise A, FNP  sacubitril -valsartan  (ENTRESTO ) 49-51 MG Take 1 tablet by mouth 2 (two) times daily. 03/07/23  Yes Hackney, Tina A, FNP  spironolactone  (ALDACTONE ) 25 MG tablet Take 1 tablet (25 mg total) by mouth once daily. 09/20/22  Yes Hackney, Ellouise A, FNP  torsemide  (DEMADEX ) 20 MG tablet Take 2 tablets (40 mg total) by mouth daily. 03/07/23  Yes Donette Ellouise A, FNP  empagliflozin  (JARDIANCE ) 10 MG TABS tablet Take 1 tablet (10 mg total) by mouth daily before breakfast. Patient not taking: Reported on 03/20/2023 03/07/23   Donette Ellouise A, FNP  OXYGEN Inhale 4 L into the lungs at bedtime.    [provider]     Critical care time: 40 minutes    Belva November, MD [facility] 03/25/2023 9:39 AM

## 2023-03-25 NOTE — Progress Notes (Signed)
 ANTICOAGULATION CONSULT NOTE  Pharmacy Consult for Heparin  Infusion Indication: atrial fibrillation  Patient Measurements: Weight: 122 kg (268 lb 15.4 oz) (Patient bed reads 178kg. unable to zero) Heparin  Dosing Weight: 101.6 kg  Labs: Recent Labs    03/22/23 1227 03/23/23 0516 03/23/23 0516 03/24/23 0421 03/24/23 1739 03/25/23 0426  HGB  --  18.6*   < > 18.5*  --  18.2*  HCT  --  56.3*  --  57.0*  --  56.7*  PLT  --  175  --  181  --  168  APTT 78* 79*  --  74*  --   --   HEPARINUNFRC  --  0.66  --  0.49  --  0.34  CREATININE  --  1.50*   < > 1.34* 1.36* 1.65*   < > = values in this interval not displayed.    Estimated Creatinine Clearance: 66.7 mL/min (A) (by C-G formula based on SCr of 1.65 mg/dL (H)).   Medical History: Past Medical History:  Diagnosis Date   Atrial flutter (HCC)    a. CHA2DS2VASc = 4-->xarelto .   CAD (coronary artery disease)    a. 04/2020 Cath: LM nl, LAD 30ost/p, LCX nl, RCA 30p/m.   Chronic combined systolic and diastolic CHF (congestive heart failure) (HCC)    a. 03/2020 Echo: EF 40-45%; b. 05/2021 Echo: EF 40-45%; c. 10/2022 Echo: EF 55-60%, no rwma, sev LVH, RVSP 23.1 mmHg.   Diabetes mellitus without complication (HCC)    Hyperlipidemia    Hypertension    NICM (nonischemic cardiomyopathy) (HCC)    Nonadherence to medication    Obesity    Sleep apnea, obstructive     Medications:  PTA Meds: Rivaroxaban  20 mg qpm, last dose 03/20/23 @ 1637  Assessment: Pt is a 48 yo male with h/o A. Fib, on Xarelto , admitted for CHF exacerbation, now found with elevated troponins likely demand ischemia, being transitioned to heparin  infusion. Cardiology consulted.  Goal of Therapy:  Heparin  level 0.3-0.7 units/ml aPTT 66-102 seconds Monitor platelets by anticoagulation protocol: Yes  0204 2123 aPTT 52, subtherapeutic 0205 0419 aPTT 80, therapeutic x 1 / HL >1.1, not correlating 0205 1227 aPTT 78, therapeutic x 2 0206 0516 aPTT 79, therapeutic x 3 /  HL 0.66, correlating x 1 0207 0421 aPTT 74, therapeutic x 4 / HL 0.49, correlating x 2 0208 0426 HL 0.34, therapeutic x 5   Plan:  --Continue heparin  infusion at 1800 units/hr --HL, CBC tomorrow AM  Rankin CANDIE Dills, PharmD, [facility] 03/25/2023 6:14 AM

## 2023-03-25 NOTE — Plan of Care (Signed)

## 2023-03-26 DIAGNOSIS — R570 Cardiogenic shock: Secondary | ICD-10-CM | POA: Diagnosis not present

## 2023-03-26 DIAGNOSIS — I50810 Right heart failure, unspecified: Secondary | ICD-10-CM | POA: Diagnosis not present

## 2023-03-26 DIAGNOSIS — I5023 Acute on chronic systolic (congestive) heart failure: Secondary | ICD-10-CM | POA: Diagnosis not present

## 2023-03-26 DIAGNOSIS — I5031 Acute diastolic (congestive) heart failure: Secondary | ICD-10-CM | POA: Diagnosis not present

## 2023-03-26 DIAGNOSIS — J9601 Acute respiratory failure with hypoxia: Secondary | ICD-10-CM | POA: Diagnosis not present

## 2023-03-26 DIAGNOSIS — J9602 Acute respiratory failure with hypercapnia: Secondary | ICD-10-CM | POA: Diagnosis not present

## 2023-03-26 DIAGNOSIS — I480 Paroxysmal atrial fibrillation: Secondary | ICD-10-CM

## 2023-03-26 DIAGNOSIS — G9341 Metabolic encephalopathy: Secondary | ICD-10-CM | POA: Diagnosis not present

## 2023-03-26 LAB — BASIC METABOLIC PANEL
Anion gap: 11 (ref 5–15)
Anion gap: 12 (ref 5–15)
BUN: 37 mg/dL — ABNORMAL HIGH (ref 6–20)
BUN: 46 mg/dL — ABNORMAL HIGH (ref 6–20)
CO2: 27 mmol/L (ref 22–32)
CO2: 27 mmol/L (ref 22–32)
Calcium: 8.1 mg/dL — ABNORMAL LOW (ref 8.9–10.3)
Calcium: 8.5 mg/dL — ABNORMAL LOW (ref 8.9–10.3)
Chloride: 103 mmol/L (ref 98–111)
Chloride: 102 mmol/L (ref 98–111)
Creatinine, Ser: 1.64 mg/dL — ABNORMAL HIGH (ref 0.61–1.24)
Creatinine, Ser: 1.76 mg/dL — ABNORMAL HIGH (ref 0.61–1.24)
GFR, Estimated: 51 mL/min — ABNORMAL LOW (ref >60)
GFR, Estimated: 47 mL/min — ABNORMAL LOW (ref >60)
Glucose, Bld: 127 mg/dL — ABNORMAL HIGH (ref 70–99)
Glucose, Bld: 161 mg/dL — ABNORMAL HIGH (ref 70–99)
Potassium: 4.1 mmol/L (ref 3.5–5.1)
Potassium: 4.3 mmol/L (ref 3.5–5.1)
Sodium: 141 mmol/L (ref 135–145)
Sodium: 141 mmol/L (ref 135–145)

## 2023-03-26 LAB — HEPARIN LEVEL (UNFRACTIONATED): Heparin Unfractionated: 0.42 [IU]/mL (ref 0.30–0.70)

## 2023-03-26 LAB — GLUCOSE, CAPILLARY
Glucose-Capillary: 121 mg/dL — ABNORMAL HIGH (ref 70–99)
Glucose-Capillary: 148 mg/dL — ABNORMAL HIGH (ref 70–99)
Glucose-Capillary: 162 mg/dL — ABNORMAL HIGH (ref 70–99)
Glucose-Capillary: 165 mg/dL — ABNORMAL HIGH (ref 70–99)
Glucose-Capillary: 184 mg/dL — ABNORMAL HIGH (ref 70–99)
Glucose-Capillary: 186 mg/dL — ABNORMAL HIGH (ref 70–99)

## 2023-03-26 LAB — COOXEMETRY PANEL
Carboxyhemoglobin: 2 % — ABNORMAL HIGH (ref 0.5–1.5)
Methemoglobin: <0.7 % (ref 0.0–1.5)
O2 Saturation: 80.2 %
Total hemoglobin: 18.7 g/dL — ABNORMAL HIGH (ref 12.0–16.0)
Total oxygen content: 78.3 %

## 2023-03-26 LAB — CBC
HCT: 53.9 % — ABNORMAL HIGH (ref 39.0–52.0)
Hemoglobin: 16.8 g/dL (ref 13.0–17.0)
MCH: 28.8 pg (ref 26.0–34.0)
MCHC: 31.2 g/dL (ref 30.0–36.0)
MCV: 92.5 fL (ref 80.0–100.0)
Platelets: 157 10*3/uL (ref 150–400)
RBC: 5.83 MIL/uL — ABNORMAL HIGH (ref 4.22–5.81)
RDW: 19.9 % — ABNORMAL HIGH (ref 11.5–15.5)
WBC: 6.1 10*3/uL (ref 4.0–10.5)
nRBC: 0 % (ref 0.0–0.2)

## 2023-03-26 LAB — APTT: aPTT: 92 s — ABNORMAL HIGH (ref 24–36)

## 2023-03-26 LAB — MAGNESIUM: Magnesium: 2.4 mg/dL (ref 1.7–2.4)

## 2023-03-26 MED ORDER — VANCOMYCIN HCL 2000 MG/400ML IV SOLN
2000.0000 mg | Freq: Once | INTRAVENOUS | Status: AC
  Administered 2023-03-26: 2000 mg via INTRAVENOUS
  Filled 2023-03-26: qty 400

## 2023-03-26 MED ORDER — SODIUM CHLORIDE 0.9 % IV SOLN
2.0000 g | Freq: Three times a day (TID) | INTRAVENOUS | Status: DC
  Administered 2023-03-26 – 2023-03-28 (×6): 2 g via INTRAVENOUS
  Filled 2023-03-26 (×8): qty 12.5

## 2023-03-26 MED ORDER — IBUPROFEN 400 MG PO TABS
800.0000 mg | ORAL_TABLET | Freq: Four times a day (QID) | ORAL | Status: DC | PRN
  Administered 2023-03-26 – 2023-03-27 (×3): 800 mg via ORAL
  Filled 2023-03-26 (×3): qty 2

## 2023-03-26 MED ORDER — VANCOMYCIN HCL 1500 MG/300ML IV SOLN
1500.0000 mg | INTRAVENOUS | Status: DC
Start: 2023-03-27 — End: 2023-03-28
  Administered 2023-03-27 – 2023-03-28 (×2): 1500 mg via INTRAVENOUS
  Filled 2023-03-26 (×2): qty 300

## 2023-03-26 NOTE — Progress Notes (Signed)
 NAME:  Gregory Mckenzie, MRN:  [id number], DOB:  [birth year], LOS: 6 ADMISSION DATE:  03/20/2023, CHIEF COMPLAINT:  Respiratory Failure   History of Present Illness:   48 y.o morbidly obese male with significant PMH of chronic combined systolic and diastolic CHF, atrial fibrillation/flutter, T2DM, HLD, HTN, OSA not on CPAP, obesity hypoventilation syndrome, former smoker, and polycythemia who presented to the ED from the heart failure clinic with chief complaints of acute respiratory distress.   Per ED reports patient was seen in heart failure clinic for evaluation of shortness of breath with very little exertion, fatigue, worsening abdominal distention, cough, worsening pedal edema and weight gain.  He ran out of his oxygen last night around 9 PM.  When he was noted with sats in the lower 60s on room air she was placed on 3L which was increased to 5 L with only improvement up to 83%.  Patient was sent to the ED for further evaluation   ED Course: Initial vital signs showed HR of 86 beats/minute, BP 153/130 mm Hg, the RR 20 breaths/minute, and the oxygen saturation 84% on 5L and a temperature of 98.47F (37.1C).  Pertinent Labs/Diagnostics Findings: Na+/ K+: 140/4.8.  Glucose: 147.  BUN/Cr.:  44/2.19  WBC: 4.2 K/L COVID PCR: Negative,  troponin: 122~152~121  BNP:1596.6   VBG: pO2 47; pCO2 102; pH 7.19;  HCO3 39.0, %O2 Sat 58.6.  CXR> Cardiomegaly, vascular congestion  Medication administered in the ED: Disposition: Patient was given IV Lasix , placed on BiPAP and admitted to hospitalist service.  He however continued declined to wear his BiPAP.  He was given sedation with no improvement.  While still holding in the ED he was noted to be more lethargic and and less responsive therefore he was intubated for airway protection.  PCCM consulted  Pertinent  Medical History  chronic combined systolic and diastolic CHF, atrial fibrillation/flutter, T2DM, HLD, HTN, OSA not on CPAP, obesity  hypoventilation syndrome, former smoker, and polycythemia     Significant Hospital Events: Including procedures, antibiotic start and stop dates in addition to other pertinent events   2/3: Admitted to hospitalist service with acute on chronic hypoxic hypercapnic respiratory failure in the setting of CHF exacerbation. Failed BiPAP requiring intubation.  PCCM consulted 2/4: remains intubated, on diuresis, PICC line placed 2/5: aggressively diuresed, improved oxygen requirements 2/6: remains intubated, Co-ox 67% this AM. Kidney function improved 2/7: intubated, follows commands, I/O -22L, kidney function improved 2/8: creatinine bumped, follows commands off sedation, I/O -23L 2/9 remains on vent, I/O -26L   EVENTS OVERNIGHT Remains intubated PEEP at 10 Severe decompensated CHF(HFpEF) Light sedation Morbidly obese  Vent Mode: PRVC FiO2 (%):  [40 %] 40 % Set Rate:  [20 bmp] 20 bmp Vt Set:  [500 mL] 500 mL PEEP:  [10 cmH20] 10 cmH20 Plateau Pressure:  [24 cmH20] 24 cmH20   Intake/Output Summary (Last 24 hours) at 03/26/2023 0740 Last data filed at 03/26/2023 9272 Gross per 24 hour  Intake 3346.39 ml  Output 5725 ml  Net -2378.61 ml            Objective   Blood pressure 110/71, pulse 93, temperature (!) 102.8 F (39.3 C), temperature source Axillary, resp. rate (!) 25, weight 122 kg, SpO2 93%. CVP:  [10 mmHg-29 mmHg] 11 mmHg  Vent Mode: PRVC FiO2 (%):  [40 %] 40 % Set Rate:  [20 bmp] 20 bmp Vt Set:  [500 mL] 500 mL PEEP:  [10 cmH20] 10 cmH20 Plateau Pressure:  [24 cmH20]  24 cmH20   Intake/Output Summary (Last 24 hours) at 03/26/2023 0739 Last data filed at 03/26/2023 9272 Gross per 24 hour  Intake 3346.39 ml  Output 5725 ml  Net -2378.61 ml   Filed Weights   03/24/23 0445 03/25/23 0451 03/26/23 0426  Weight: 122 kg 122 kg 122 kg     REVIEW OF SYSTEMS  PATIENT IS UNABLE TO PROVIDE COMPLETE REVIEW OF SYSTEMS DUE TO SEVERE CRITICAL ILLNESS   PHYSICAL  EXAMINATION:  GENERAL:critically ill appearing, EYES: Pupils equal, round, reactive to light.  No scleral icterus.  MOUTH: Moist mucosal membrane. INTUBATED NECK: Supple.  PULMONARY: Lungs clear to auscultation, +rhonchi, CARDIOVASCULAR: S1 and S2.  Regular rate and rhythm GASTROINTESTINAL: Soft, nontender, -distended. Positive bowel sounds.  MUSCULOSKELETAL: No swelling, clubbing, or edema.  NEUROLOGIC: obtunded,sedated SKIN:normal, warm to touch, Capillary refill delayed  Pulses present bilaterally   Assessment & Plan:  48 yo morbidly obese AAM patient with  severe decompensated HFpEF with PULM HTN/RV dysfunction with underlying OSA/OHS severe acute hypoxic resp failure with metabolic encephalopathy  Severe ACUTE Hypoxic and Hypercapnic Respiratory Failure -continue Mechanical Ventilator support -Wean Fio2 and PEEP as tolerated -VAP/VENT bundle implementation - Wean PEEP & FiO2 as tolerated, maintain SpO2 > 88% - Head of bed elevated 30 degrees, VAP protocol in place - Plateau pressures less than 30 cm H20  - Intermittent chest x-ray & ABG PRN - Ensure adequate pulmonary hygiene  -will perform SAT/SBT when respiratory parameters are met Wean PEEP to 5 and assess for SBT Increased ETT secretions-Cultures to be sent  NEUROLOGY ACUTE METABOLIC ENCEPHALOPATHY -need for sedation -Goal RASS -2 to -3  ACUTE  CARDIAC FAILURE- HFpEF Echo with biventricular failure with signs of pulmonary hypertension and RV dysfunction. On IV diuresis with 80 of furosemide  IV bid -follow up cardiology recs CVP target goal 10  ACUTE KIDNEY INJURY/Renal Failure -continue Foley Catheter-assess need -Avoid nephrotoxic agents -Follow urine output, BMP -Ensure adequate renal perfusion, optimize oxygenation -Renal dose medications Lasix  as prescribed   Intake/Output Summary (Last 24 hours) at 03/26/2023 0745 Last data filed at 03/26/2023 0727 Gross per 24 hour  Intake 3346.39 ml  Output 5725 ml   Net -2378.61 ml      Latest Ref Rng & Units 03/26/2023    5:34 AM 03/25/2023    6:07 PM 03/25/2023    4:26 AM  BMP  Glucose 70 - 99 mg/dL 872  879  894   BUN 6 - 20 mg/dL 37  34  30   Creatinine 0.61 - 1.24 mg/dL 8.35  8.36  8.34   Sodium 135 - 145 mmol/L 141  142  137   Potassium 3.5 - 5.1 mmol/L 4.1  4.6  4.2   Chloride 98 - 111 mmol/L 103  99  97   CO2 22 - 32 mmol/L 27  30  28    Calcium  8.9 - 10.3 mg/dL 8.1  9.1  8.4    INFECTIOUS DISEASE -continue antibiotics as prescribed -follow up cultures afebrile but respiratory culture gram stain with gram positive cocci. Continue antibiotics with Ceftriaxone    ENDO - ICU hypoglycemic\Hyperglycemia protocol -check FSBS per protocol   GI GI PROPHYLAXIS as indicated NUTRITIONAL STATUS DIET-->TF's as tolerated Constipation protocol as indicated   ELECTROLYTES -follow labs as needed -replace as needed -pharmacy consultation and following  RESTRICTIVE TRANSFUSION PROTOCOL TRANSFUSION  IF HGB<7  or ACTIVE BLEEDING OR DX of ACUTE CORONARY SYNDROMES    Best Practice (right click and Reselect all SmartList Selections daily)  Diet/type: tubefeeds DVT prophylaxis systemic heparin  Pressure ulcer(s): N/A GI prophylaxis: PPI Lines: Central line and yes and it is still needed Foley:  Yes, and it is still needed Code Status:  full code Last date of multidisciplinary goals of care discussion [03/25/2023]  Labs   CBC: Recent Labs  Lab 03/22/23 0419 03/23/23 0516 03/24/23 0421 03/25/23 0426 03/26/23 0422  WBC 7.1 4.0 4.3 4.3 6.1  HGB 18.1* 18.6* 18.5* 18.2* 16.8  HCT 55.6* 56.3* 57.0* 56.7* 53.9*  MCV 88.4 87.7 89.2 92.3 92.5  PLT 187 175 181 168 157    Basic Metabolic Panel: Recent Labs  Lab 03/22/23 0419 03/22/23 1227 03/22/23 1709 03/22/23 2326 03/23/23 0516 03/23/23 1638 03/23/23 1921 03/24/23 0421 03/24/23 1739 03/25/23 0426 03/25/23 1807 03/26/23 0534  NA 142  --   --   --  142  --   --  143 144 137  142 141  K 3.9   < > 3.6   < > 3.7  --    < > 4.1 4.0 4.2 4.6 4.1  CL 98  --   --   --  97*  --   --  97* 101 97* 99 103  CO2 33*  --   --   --  33*  --   --  32 30 28 30 27   GLUCOSE 113*  --   --   --  112*  --   --  142* 103* 105* 120* 127*  BUN 32*  --   --   --  25*  --   --  26* 28* 30* 34* 37*  CREATININE 1.66*  --   --   --  1.50*  --   --  1.34* 1.36* 1.65* 1.63* 1.64*  CALCIUM  8.9  --   --   --  8.8*  --   --  8.6* 8.0* 8.4* 9.1 8.1*  MG 2.0  --  2.1  --  2.0 2.2  --  2.2  --   --   --  2.4  PHOS 2.8  --  2.8  --  3.3  3.3 4.8*  --  4.6  --   --   --   --    < > = values in this interval not displayed.   GFR: Estimated Creatinine Clearance: 67.1 mL/min (A) (by C-G formula based on SCr of 1.64 mg/dL (H)). Recent Labs  Lab 03/21/23 0246 03/21/23 0636 03/22/23 0419 03/22/23 1252 03/23/23 0516 03/24/23 0421 03/25/23 0426 03/26/23 0422  WBC 3.9*  --    < >  --  4.0 4.3 4.3 6.1  LATICACIDVEN 1.4 1.4  --  1.7  --   --   --   --    < > = values in this interval not displayed.    Liver Function Tests: Recent Labs  Lab 03/21/23 0246 03/22/23 0419 03/23/23 0516 03/24/23 0421  AST 20  --   --   --   ALT 30  --   --   --   ALKPHOS 165*  --   --   --   BILITOT 2.5*  --   --   --   PROT 6.6  --   --   --   ALBUMIN 3.3* 2.6* 2.6* 2.8*   No results for input(s): LIPASE, AMYLASE in the last 168 hours. No results for input(s): AMMONIA in the last 168 hours.  ABG    Component Value Date/Time   PHART 7.47 (H)  03/24/2023 0500   PCO2ART 53 (H) 03/24/2023 0500   PO2ART 64 (L) 03/24/2023 0500   HCO3 38.6 (H) 03/24/2023 0500   TCO2 31 05/08/2020 1324   TCO2 32 05/08/2020 1324   O2SAT 80.2 03/26/2023 0422     Coagulation Profile: Recent Labs  Lab 03/20/23 1046  INR 2.0*    Cardiac Enzymes: No results for input(s): CKTOTAL, CKMB, CKMBINDEX, TROPONINI in the last 168 hours.  HbA1C: Hgb A1c MFr Bld  Date/Time Value Ref Range Status  09/28/2022 11:34 AM  7.0 (H) 4.8 - 5.6 % Final    Comment:             Prediabetes: 5.7 - 6.4          Diabetes: >6.4          Glycemic control for adults with diabetes: <7.0   12/21/2021 11:08 AM 6.6 (H) 4.8 - 5.6 % Final    Comment:    (NOTE) Pre diabetes:          5.7%-6.4%  Diabetes:              >6.4%  Glycemic control for   <7.0% adults with diabetes     CBG: Recent Labs  Lab 03/25/23 1118 03/25/23 1521 03/25/23 1934 03/25/23 2345 03/26/23 0408  GLUCAP 141* 152* 163* 137* 165*    Review of Systems:   N/A  Past Medical History:  He,  has a past medical history of Atrial flutter (HCC), CAD (coronary artery disease), Chronic combined systolic and diastolic CHF (congestive heart failure) (HCC), Diabetes mellitus without complication (HCC), Hyperlipidemia, Hypertension, NICM (nonischemic cardiomyopathy) (HCC), Nonadherence to medication, Obesity, and Sleep apnea, obstructive.   Surgical History:   Past Surgical History:  Procedure Laterality Date   RIGHT/LEFT HEART CATH AND CORONARY ANGIOGRAPHY N/A 05/08/2020   Procedure: RIGHT/LEFT HEART CATH AND CORONARY ANGIOGRAPHY;  Surgeon: Rolan Ezra RAMAN, MD;  Location: [facility] INVASIVE CV LAB;  Service: Cardiovascular;  Laterality: N/A;     Social History:   reports that he quit smoking about 2 years ago. His smoking use included cigarettes. He started smoking about 29 years ago. He has a 27 pack-year smoking history. He has never used smokeless tobacco. He reports that he does not currently use alcohol . He reports current drug use. Frequency: 7.00 times per week. Drug: Marijuana.   Family History:  His family history includes Hypertension in his brother and father; Pulmonary embolism in his brother.   Allergies No Known Allergies   Home Medications  Prior to Admission medications   Medication Sig Start Date End Date Taking? Authorizing Provider  atorvastatin  (LIPITOR ) 40 MG tablet Take 2 tablets (80 mg total) by mouth at bedtime. 03/16/23  Yes  Donette Ellouise LABOR, FNP  carvedilol  (COREG ) 6.25 MG tablet Take 1 tablet (6.25 mg total) by mouth 2 (two) times daily with a meal. 09/20/22  Yes Donette Ellouise LABOR, FNP  empagliflozin  (JARDIANCE ) 10 MG TABS tablet Take 1 tablet (10 mg total) by mouth daily before breakfast. 09/21/22  Yes Donette Ellouise A, FNP  rivaroxaban  (XARELTO ) 20 MG TABS tablet Take 1 tablet (20 mg total) by mouth daily with supper. 09/20/22  Yes Hackney, Ellouise A, FNP  sacubitril -valsartan  (ENTRESTO ) 49-51 MG Take 1 tablet by mouth 2 (two) times daily. 03/07/23  Yes Donette Ellouise A, FNP  spironolactone  (ALDACTONE ) 25 MG tablet Take 1 tablet (25 mg total) by mouth once daily. 09/20/22  Yes Donette Ellouise A, FNP  torsemide  (DEMADEX ) 20 MG tablet Take 2  tablets (40 mg total) by mouth daily. 03/07/23  Yes Donette Ellouise LABOR, FNP  empagliflozin  (JARDIANCE ) 10 MG TABS tablet Take 1 tablet (10 mg total) by mouth daily before breakfast. Patient not taking: Reported on 03/20/2023 03/07/23   Donette Ellouise A, FNP  OXYGEN Inhale 4 L into the lungs at bedtime.    [provider]     Overall Prognosis is poor due to cardiorenal syndrome with morbid obesity   Critical Care Time devoted to patient care services described in this note is 55 minutes.  Critical care was necessary to treat /prevent imminent and life-threatening deterioration. Overall, patient is critically ill, prognosis is guarded.  Patient with Multiorgan failure and at high risk for cardiac arrest and death.    Nickolas Alm Cellar, M.D.  [facility]  Medical Director [facility] Medical Director [facility] Cardio-Pulmonary Department

## 2023-03-26 NOTE — Progress Notes (Addendum)
 Patient ID: Gregory Mckenzie, male   DOB: 07/22/1975, 48 y.o.   MRN: [id number]      Rounding Note  Cardiologist: Lonni Hanson, MD  Chief Complaint: Heart Failure  Subjective:    Excellent diuresis yesterday.  He put out 5.4 L and is net -26 L since admission.   Remains intubated on FiO2 40%.  Co. ox 78.3 CVP 10 Creatinine 1.66 => 1.5 => 1.34=> 1.65=> 1.64.    Objective:   Weight Range: 122 kg Body mass index is 43.41 kg/m.   Vital Signs:   Temp:  [98 F (36.7 C)-102.8 F (39.3 C)] 102.8 F (39.3 C) (02/09 0845) Pulse Rate:  [82-110] 93 (02/09 0726) Resp:  [12-25] 20 (02/09 0900) BP: (89-130)/(50-92) 101/68 (02/09 0900) SpO2:  [91 %-100 %] 92 % (02/09 0845) FiO2 (%):  [40 %] 40 % (02/09 0400) Weight:  [122 kg] 122 kg (02/09 0426) Last BM Date :  (pta)  Weight change: Filed Weights   03/24/23 0445 03/25/23 0451 03/26/23 0426  Weight: 122 kg 122 kg 122 kg    Intake/Output:   Intake/Output Summary (Last 24 hours) at 03/26/2023 0932 Last data filed at 03/26/2023 0915 Gross per 24 hour  Intake 2966.39 ml  Output 5550 ml  Net -2583.61 ml     CVP 10  Physical Exam   GEN: Remains intubated HEENT: Normal NECK: No JVD; No carotid bruits LYMPHATICS: No lymphadenopathy CARDIAC:RRR, no murmurs, rubs, gallops RESPIRATORY:  Clear to auscultation without rales, wheezing or rhonchi  ABDOMEN: Soft, non-tender, non-distended MUSCULOSKELETAL:  No edema; No deformity  SKIN: Warm and dry NEUROLOGIC: Cannot assess PSYCHIATRIC: Cannot assess  Normal sinus rhythm with PVCs>> telemetry personally reviewed   EKG    N/A   Labs    CBC Recent Labs    03/25/23 0426 03/26/23 0422  WBC 4.3 6.1  HGB 18.2* 16.8  HCT 56.7* 53.9*  MCV 92.3 92.5  PLT 168 157   Basic Metabolic Panel Recent Labs    97/93/74 1638 03/23/23 1921 03/24/23 0421 03/24/23 1739 03/25/23 1807 03/26/23 0534  NA  --   --  143   < > 142 141  K  --    < > 4.1   < > 4.6 4.1  CL  --   --   97*   < > 99 103  CO2  --   --  32   < > 30 27  GLUCOSE  --   --  142*   < > 120* 127*  BUN  --   --  26*   < > 34* 37*  CREATININE  --   --  1.34*   < > 1.63* 1.64*  CALCIUM   --   --  8.6*   < > 9.1 8.1*  MG 2.2  --  2.2  --   --  2.4  PHOS 4.8*  --  4.6  --   --   --    < > = values in this interval not displayed.   Liver Function Tests Recent Labs    03/24/23 0421  ALBUMIN 2.8*   No results for input(s): LIPASE, AMYLASE in the last 72 hours. Cardiac Enzymes No results for input(s): CKTOTAL, CKMB, CKMBINDEX, TROPONINI in the last 72 hours.  BNP: BNP (last 3 results) Recent Labs    03/07/23 1207 03/09/23 0957 03/20/23 1046  BNP 2,259.3* 1,857.9* 1,596.6*    ProBNP (last 3 results) No results for input(s): PROBNP in the last 8760  hours.   D-Dimer No results for input(s): DDIMER in the last 72 hours. Hemoglobin A1C No results for input(s): HGBA1C in the last 72 hours. Fasting Lipid Panel Recent Labs    03/25/23 0426  TRIG 589*   Thyroid Function Tests No results for input(s): TSH, T4TOTAL, T3FREE, THYROIDAB in the last 72 hours.  Invalid input(s): FREET3  Other results:   Imaging    No results found.    Medications:     Scheduled Medications:  atorvastatin   80 mg Oral QHS   Chlorhexidine  Gluconate Cloth  6 each Topical Daily   docusate  100 mg Oral BID   feeding supplement (PROSource TF20)  60 mL Per Tube Daily   free water   30 mL Per Tube Q4H   furosemide   80 mg Intravenous BID   insulin  aspart  2-6 Units Subcutaneous Q4H   ipratropium-albuterol   3 mL Nebulization Q6H   mouth rinse  15 mL Mouth Rinse Q2H   pantoprazole  (PROTONIX ) IV  40 mg Intravenous Q24H   polyethylene glycol  17 g Oral Daily   sodium chloride  flush  10-40 mL Intracatheter Q12H   spironolactone   12.5 mg Oral Daily    Infusions:  cefTRIAXone  (ROCEPHIN )  IV 2 g (03/26/23 0925)   dexmedetomidine  (PRECEDEX ) IV infusion 1.2 mcg/kg/hr (03/26/23  0742)   feeding supplement (VITAL HIGH PROTEIN) 60 mL/hr at 03/26/23 0449   fentaNYL  infusion INTRAVENOUS 150 mcg/hr (03/26/23 0449)   heparin  1,800 Units/hr (03/26/23 0449)   norepinephrine  (LEVOPHED ) Adult infusion Stopped (03/24/23 0909)   propofol  (DIPRIVAN ) infusion Stopped (03/25/23 1044)    PRN Medications: acetaminophen , albuterol , bisacodyl , fentaNYL , ibuprofen , midazolam , ondansetron  **OR** ondansetron  (ZOFRAN ) IV, mouth rinse, sodium chloride  flush    Patient Profile  Gregory Mckenzie is 48 uyear old with history HFpEF, DMII, HLD, OSA, HTN, PAF, chronic hypoxic respiratory failure on 2-4 liters, and polycythemia. 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 Family history of HTN Dad/Brother. Brother also had PE.   Admitted with A/C Hypoxic Respiratory Failure and A/C HFpEF Assessment/Plan   1. Acute on chronic HF with mid-range EF: Significant RV dysfunction and likely significant pulmonary hypertension in the setting of OHS/OSA. Echo this admission showed EF 45-50%, severe LVH, moderate RV enlargement and moderate RV dysfunction, PASP 44 mmHg.  -Excellent diuresis yesterday putting out over 5 L and is net -26 L since admission - SCr stable around his baseline 1.36>>1.65>>1.63>>1.64 today  ( baseline 1.4-1.6)>> will not change diuretic dose at this time since he is still within his normal baseline range - CVP now down to 10 - Continue another day of Lasix  80 mg IV twice daily without acetazolamide  and likely will be able to transition to oral diuretics tomorrow -Continue spironolactone  12.5 mg daily - After extubation and further diuresis, consider RHC to assess filling pressures/PA pressure.  - Continue follow daily weights, strict I's and O's and renal function while diuresing  2. Hypotension/shock: Suspect this is due to combination of RV failure and sedation with intubation.   - He was on NEObut this has been stopped and blood pressure stable this morning at 111/73 mmHg  3. Acute  hypoxemic/hypercarbic respiratory failure: In setting of OHS/OSA (on home oxygen 3-4 L, has not had CPAP for 6 months) and CHF/volume overload.  Now intubated, respiratory mechanics improving with diuresis. FiO2 0.4.  - Continue diuresis - Vent per CCM, hopefully extubate soon.  - Will need to get back on CPAP at home.   4. CKD stage 3a: Creatinine 1.66 =>  1.5 => 1.34 => 1.65=> 1.63=> 1.64 today - Serum creatinine still near his baseline (1.4-1.6).   - follow with diuresis  5. Polycythemia: Likely due to chronic hypoxemia.   6. Elevated troponin: Mild elevation, no trend.  - Had cath in 2022 with minimal nonobstructive CAD.  - Continue atorvastatin  80 mg daily  7. Atrial fibrillation:  -this is paroxysmal  -Currently maintaining normal sinus rhythm on telemetry -On Xarelto  at home but this is currently -Continue heparin  gtt while intubated.   8. DM2: SSI.   CRITICAL CARE Performed by: Wilbert Bihari  Total critical care time: 30 minutes  Critical care time was exclusive of separately billable procedures and treating other patients.  Critical care was necessary to treat or prevent imminent or life-threatening deterioration.  Critical care was time spent personally by me on the following activities: development of treatment plan with patient and/or surrogate as well as nursing, discussions with consultants, evaluation of patient's response to treatment, examination of patient, obtaining history from patient or surrogate, ordering and performing treatments and interventions, ordering and review of laboratory studies, ordering and review of radiographic studies, pulse oximetry and re-evaluation of patient's condition.  Wilbert Bihari 03/26/2023 9:32 AM

## 2023-03-26 NOTE — Progress Notes (Signed)
 Pharmacy Antibiotic Note  Gregory Mckenzie is a 48 y.o. male w/ w/ PMH of DM, hyperlipidemia, sleep apnea (CPAP broken), HTN, paroxysmal atrial flutter, DM2, obesity, polycythemia, possible obesity hypoventilation syndrome, previous tobacco use and chronic HFpEF admitted on 03/20/2023 with pneumonia, CHFe.  Pharmacy has been consulted for vancomycin  and cefepime  dosing.  Plan:  1) start cefepime  2 grams IV every 8 hours  2) start vancomycin  2000 mg IV x 1 then 1500 mg IV every 24 hours  Goal AUC 400-550. Expected AUC: 543.9 SCr used: 1.64 mg/dL Ke 9.954y-8, U8/7 84.6y   Weight: 122 kg (268 lb 15.4 oz) (patient bed scale says 177kg, unable to rezero)  Temp (24hrs), Avg:100.8 F (38.2 C), Min:98 F (36.7 C), Max:102.8 F (39.3 C)  Recent Labs  Lab 03/21/23 0246 03/21/23 0636 03/21/23 1351 03/22/23 0419 03/22/23 1252 03/23/23 0516 03/24/23 0421 03/24/23 1739 03/25/23 0426 03/25/23 1807 03/26/23 0422 03/26/23 0534  WBC 3.9*  --   --  7.1  --  4.0 4.3  --  4.3  --  6.1  --   CREATININE 1.73*  --    < > 1.66*  --  1.50* 1.34* 1.36* 1.65* 1.63*  --  1.64*  LATICACIDVEN 1.4 1.4  --   --  1.7  --   --   --   --   --   --   --    < > = values in this interval not displayed.    Estimated Creatinine Clearance: 67.1 mL/min (A) (by C-G formula based on SCr of 1.64 mg/dL (H)).    No Known Allergies  Antimicrobials this admission: 02/03 azithromycin  >> 02/04 02/05 ceftriaxone  >> 02/09 02/09 vancomycin  >>  02/09 cefepime  >>  Microbiology results: 02/04 Sputum: NRF  02/09 RCx pending 02/04 MRSA PCR: negative  Thank you for allowing pharmacy to be a part of this patient's care.  Gregory Mckenzie 03/26/2023 10:02 AM

## 2023-03-26 NOTE — Progress Notes (Signed)
 ANTICOAGULATION CONSULT NOTE  Pharmacy Consult for Heparin  Infusion Indication: atrial fibrillation  Patient Measurements: Weight: 122 kg (268 lb 15.4 oz) (patient bed scale says 177kg, unable to rezero) Heparin  Dosing Weight: 101.6 kg  Labs: Recent Labs    03/24/23 0421 03/24/23 1739 03/25/23 0426 03/25/23 1807 03/26/23 0422  HGB 18.5*  --  18.2*  --  16.8  HCT 57.0*  --  56.7*  --  53.9*  PLT 181  --  168  --  157  APTT 74*  --   --   --  92*  HEPARINUNFRC 0.49  --  0.34  --  0.42  CREATININE 1.34* 1.36* 1.65* 1.63*  --     Estimated Creatinine Clearance: 67.5 mL/min (A) (by C-G formula based on SCr of 1.63 mg/dL (H)).   Medical History: Past Medical History:  Diagnosis Date   Atrial flutter (HCC)    a. CHA2DS2VASc = 4-->xarelto .   CAD (coronary artery disease)    a. 04/2020 Cath: LM nl, LAD 30ost/p, LCX nl, RCA 30p/m.   Chronic combined systolic and diastolic CHF (congestive heart failure) (HCC)    a. 03/2020 Echo: EF 40-45%; b. 05/2021 Echo: EF 40-45%; c. 10/2022 Echo: EF 55-60%, no rwma, sev LVH, RVSP 23.1 mmHg.   Diabetes mellitus without complication (HCC)    Hyperlipidemia    Hypertension    NICM (nonischemic cardiomyopathy) (HCC)    Nonadherence to medication    Obesity    Sleep apnea, obstructive     Medications:  PTA Meds: Rivaroxaban  20 mg qpm, last dose 03/20/23 @ 1637  Assessment: Pt is a 48 yo male with h/o A. Fib, on Xarelto , admitted for CHF exacerbation, now found with elevated troponins likely demand ischemia, being transitioned to heparin  infusion. Cardiology consulted.  Goal of Therapy:  Heparin  level 0.3-0.7 units/ml aPTT 66-102 seconds Monitor platelets by anticoagulation protocol: Yes  0204 2123 aPTT 52, subtherapeutic 0205 0419 aPTT 80, therapeutic x 1 / HL >1.1, not correlating 0205 1227 aPTT 78, therapeutic x 2 0206 0516 aPTT 79, therapeutic x 3 / HL 0.66, correlating x 1 0207 0421 aPTT 74, therapeutic x 4 / HL 0.49, correlating x  2 0208 0426 HL 0.34, therapeutic x 5 0209 0422 HL 0.42, therapeutic x 6   Plan:  --Continue heparin  infusion at 1800 units/hr --HL, CBC tomorrow AM  Gregory Mckenzie, PharmD, [facility] 03/26/2023 5:40 AM

## 2023-03-27 ENCOUNTER — Inpatient Hospital Stay: Payer: Medicaid Other

## 2023-03-27 DIAGNOSIS — R509 Fever, unspecified: Secondary | ICD-10-CM

## 2023-03-27 DIAGNOSIS — I5020 Unspecified systolic (congestive) heart failure: Secondary | ICD-10-CM

## 2023-03-27 DIAGNOSIS — N1831 Chronic kidney disease, stage 3a: Secondary | ICD-10-CM

## 2023-03-27 DIAGNOSIS — J9601 Acute respiratory failure with hypoxia: Secondary | ICD-10-CM

## 2023-03-27 DIAGNOSIS — J9622 Acute and chronic respiratory failure with hypercapnia: Secondary | ICD-10-CM | POA: Diagnosis not present

## 2023-03-27 DIAGNOSIS — I5033 Acute on chronic diastolic (congestive) heart failure: Secondary | ICD-10-CM | POA: Diagnosis not present

## 2023-03-27 DIAGNOSIS — J9621 Acute and chronic respiratory failure with hypoxia: Secondary | ICD-10-CM | POA: Diagnosis not present

## 2023-03-27 DIAGNOSIS — I480 Paroxysmal atrial fibrillation: Secondary | ICD-10-CM

## 2023-03-27 LAB — BASIC METABOLIC PANEL
Anion gap: 11 (ref 5–15)
Anion gap: 11 (ref 5–15)
BUN: 43 mg/dL — ABNORMAL HIGH (ref 6–20)
BUN: 38 mg/dL — ABNORMAL HIGH (ref 6–20)
CO2: 30 mmol/L (ref 22–32)
CO2: 29 mmol/L (ref 22–32)
Calcium: 8.9 mg/dL (ref 8.9–10.3)
Calcium: 8.5 mg/dL — ABNORMAL LOW (ref 8.9–10.3)
Chloride: 102 mmol/L (ref 98–111)
Chloride: 106 mmol/L (ref 98–111)
Creatinine, Ser: 1.59 mg/dL — ABNORMAL HIGH (ref 0.61–1.24)
Creatinine, Ser: 1.46 mg/dL — ABNORMAL HIGH (ref 0.61–1.24)
GFR, Estimated: 53 mL/min — ABNORMAL LOW (ref >60)
GFR, Estimated: 59 mL/min — ABNORMAL LOW (ref >60)
Glucose, Bld: 151 mg/dL — ABNORMAL HIGH (ref 70–99)
Glucose, Bld: 141 mg/dL — ABNORMAL HIGH (ref 70–99)
Potassium: 4.3 mmol/L (ref 3.5–5.1)
Potassium: 4 mmol/L (ref 3.5–5.1)
Sodium: 143 mmol/L (ref 135–145)
Sodium: 146 mmol/L — ABNORMAL HIGH (ref 135–145)

## 2023-03-27 LAB — CBC
HCT: 58.4 % — ABNORMAL HIGH (ref 39.0–52.0)
Hemoglobin: 17.9 g/dL — ABNORMAL HIGH (ref 13.0–17.0)
MCH: 29 pg (ref 26.0–34.0)
MCHC: 30.7 g/dL (ref 30.0–36.0)
MCV: 94.7 fL (ref 80.0–100.0)
Platelets: 145 10*3/uL — ABNORMAL LOW (ref 150–400)
RBC: 6.17 MIL/uL — ABNORMAL HIGH (ref 4.22–5.81)
RDW: 20.1 % — ABNORMAL HIGH (ref 11.5–15.5)
WBC: 6.7 10*3/uL (ref 4.0–10.5)
nRBC: 0 % (ref 0.0–0.2)

## 2023-03-27 LAB — EXPECTORATED SPUTUM ASSESSMENT W GRAM STAIN, RFLX TO RESP C

## 2023-03-27 LAB — GLUCOSE, CAPILLARY
Glucose-Capillary: 153 mg/dL — ABNORMAL HIGH (ref 70–99)
Glucose-Capillary: 179 mg/dL — ABNORMAL HIGH (ref 70–99)
Glucose-Capillary: 195 mg/dL — ABNORMAL HIGH (ref 70–99)
Glucose-Capillary: 144 mg/dL — ABNORMAL HIGH (ref 70–99)
Glucose-Capillary: 194 mg/dL — ABNORMAL HIGH (ref 70–99)
Glucose-Capillary: 171 mg/dL — ABNORMAL HIGH (ref 70–99)

## 2023-03-27 LAB — APTT: aPTT: 88 s — ABNORMAL HIGH (ref 24–36)

## 2023-03-27 LAB — PROTIME-INR
INR: 1.1 (ref 0.8–1.2)
Prothrombin Time: 13.9 s (ref 11.4–15.2)

## 2023-03-27 LAB — MAGNESIUM
Magnesium: 2.8 mg/dL — ABNORMAL HIGH (ref 1.7–2.4)
Magnesium: 2.5 mg/dL — ABNORMAL HIGH (ref 1.7–2.4)

## 2023-03-27 LAB — COOXEMETRY PANEL
Carboxyhemoglobin: 1.5 % (ref 0.5–1.5)
Methemoglobin: 0.8 % (ref 0.0–1.5)
O2 Saturation: 84.9 %
Total hemoglobin: 18.2 g/dL — ABNORMAL HIGH (ref 12.0–16.0)
Total oxygen content: 82.9 %

## 2023-03-27 LAB — HEPARIN LEVEL (UNFRACTIONATED): Heparin Unfractionated: 0.44 [IU]/mL (ref 0.30–0.70)

## 2023-03-27 MED ORDER — INSULIN ASPART 100 UNIT/ML IJ SOLN: 2.0000 [IU] | INTRAMUSCULAR | Status: DC

## 2023-03-27 MED ORDER — FENTANYL CITRATE PF 50 MCG/ML IJ SOSY
50.0000 ug | PREFILLED_SYRINGE | INTRAMUSCULAR | Status: DC | PRN
Start: 2023-03-27 — End: 2023-03-28
  Administered 2023-03-28: 100 ug via INTRAVENOUS
  Filled 2023-03-27: qty 2

## 2023-03-27 MED ORDER — INSULIN ASPART 100 UNIT/ML IJ SOLN
2.0000 [IU] | INTRAMUSCULAR | Status: DC
  Administered 2023-03-27: 2 [IU] via SUBCUTANEOUS
  Administered 2023-03-27 – 2023-03-28 (×4): 4 [IU] via SUBCUTANEOUS
  Administered 2023-03-28: 2 [IU] via SUBCUTANEOUS
  Administered 2023-03-28: 4 [IU] via SUBCUTANEOUS
  Administered 2023-03-28: 2 [IU] via SUBCUTANEOUS
  Administered 2023-03-28: 4 [IU] via SUBCUTANEOUS
  Administered 2023-03-28 – 2023-03-29 (×2): 2 [IU] via SUBCUTANEOUS
  Administered 2023-03-29 (×3): 4 [IU] via SUBCUTANEOUS
  Administered 2023-03-29: 6 [IU] via SUBCUTANEOUS
  Administered 2023-03-30 (×2): 2 [IU] via SUBCUTANEOUS
  Administered 2023-03-30: 4 [IU] via SUBCUTANEOUS
  Administered 2023-03-30: 6 [IU] via SUBCUTANEOUS
  Administered 2023-03-30: 4 [IU] via SUBCUTANEOUS
  Administered 2023-03-30 – 2023-03-31 (×3): 2 [IU] via SUBCUTANEOUS
  Administered 2023-04-01: 6 [IU] via SUBCUTANEOUS
  Administered 2023-04-02 (×4): 2 [IU] via SUBCUTANEOUS
  Filled 2023-03-27 (×26): qty 1

## 2023-03-27 MED ORDER — SENNOSIDES 8.8 MG/5ML PO SYRP
10.0000 mL | ORAL_SOLUTION | Freq: Every day | ORAL | Status: DC
  Administered 2023-03-27: 10 mL
  Filled 2023-03-27: qty 10

## 2023-03-27 MED ORDER — IBUPROFEN 400 MG PO TABS: 800.0000 mg | ORAL_TABLET | Freq: Four times a day (QID) | ORAL | Status: DC | PRN

## 2023-03-27 MED ORDER — FENTANYL CITRATE PF 50 MCG/ML IJ SOSY
50.0000 ug | PREFILLED_SYRINGE | INTRAMUSCULAR | Status: DC | PRN
  Administered 2023-03-28 (×2): 50 ug via INTRAVENOUS
  Filled 2023-03-27 (×2): qty 1

## 2023-03-27 MED ORDER — ATORVASTATIN CALCIUM 80 MG PO TABS
80.0000 mg | ORAL_TABLET | Freq: Every day | ORAL | Status: DC
Start: 2023-03-27 — End: 2023-03-28
  Administered 2023-03-27: 80 mg
  Filled 2023-03-27: qty 1

## 2023-03-27 MED ORDER — ACETAMINOPHEN 325 MG PO TABS
975.0000 mg | ORAL_TABLET | Freq: Four times a day (QID) | ORAL | Status: DC | PRN
  Administered 2023-03-27 (×2): 975 mg
  Filled 2023-03-27 (×2): qty 3

## 2023-03-27 MED ORDER — ACETAZOLAMIDE 250 MG PO TABS
250.0000 mg | ORAL_TABLET | Freq: Once | ORAL | Status: AC
  Administered 2023-03-27: 250 mg
  Filled 2023-03-27: qty 1

## 2023-03-27 MED ORDER — DOCUSATE SODIUM 50 MG/5ML PO LIQD
100.0000 mg | Freq: Two times a day (BID) | ORAL | Status: DC
  Administered 2023-03-27 (×2): 100 mg
  Filled 2023-03-27 (×2): qty 10

## 2023-03-27 MED ORDER — POLYETHYLENE GLYCOL 3350 17 G PO PACK
17.0000 g | PACK | Freq: Every day | ORAL | Status: DC
  Administered 2023-03-27: 17 g
  Filled 2023-03-27: qty 1

## 2023-03-27 MED ORDER — BISACODYL 10 MG RE SUPP
10.0000 mg | Freq: Once | RECTAL | Status: AC
  Administered 2023-03-27: 10 mg via RECTAL
  Filled 2023-03-27: qty 1

## 2023-03-27 MED ORDER — SPIRONOLACTONE 12.5 MG HALF TABLET
12.5000 mg | ORAL_TABLET | Freq: Every day | ORAL | Status: DC
  Administered 2023-03-27: 12.5 mg
  Filled 2023-03-27 (×2): qty 1

## 2023-03-27 MED ORDER — SENNOSIDES 8.8 MG/5ML PO SYRP: 10.0000 mL | ORAL_SOLUTION | Freq: Every day | ORAL | Status: DC

## 2023-03-27 NOTE — Progress Notes (Signed)
 NAME:  Gregory Mckenzie, MRN:  [id number], DOB:  07-25-1975, LOS: 7 ADMISSION DATE:  03/20/2023 History of Present Illness:  48 y.o morbidly obese male with significant PMH of chronic combined systolic and diastolic CHF, atrial fibrillation/flutter, T2DM, HLD, HTN, OSA not on CPAP, obesity hypoventilation syndrome, former smoker, and polycythemia who presented to the ED from the heart failure clinic with chief complaints of acute respiratory distress.   Per ED reports patient was seen in heart failure clinic for evaluation of shortness of breath with very little exertion, fatigue, worsening abdominal distention, cough, worsening pedal edema and weight gain.  He ran out of his oxygen last night around 9 PM.  When he was noted with sats in the lower 60s on room air she was placed on 3L which was increased to 5 L with only improvement up to 83%.  Patient was sent to the ED for further evaluation   ED Course: Initial vital signs showed HR of 86 beats/minute, BP 153/130 mm Hg, the RR 20 breaths/minute, and the oxygen saturation 84% on 5L and a temperature of 98.24F (37.1C).  Pertinent Labs/Diagnostics Findings: Na+/ K+: 140/4.8.  Glucose: 147.  BUN/Cr.:  44/2.19  WBC: 4.2 K/L COVID PCR: Negative,  troponin: 122~152~121  BNP:1596.6   VBG: pO2 47; pCO2 102; pH 7.19;  HCO3 39.0, %O2 Sat 58.6.  CXR> Cardiomegaly, vascular congestion  Medication administered in the ED: Disposition: Patient was given IV Lasix , placed on BiPAP and admitted to hospitalist service.  He however continued declined to wear his BiPAP.  He was given sedation with no improvement.  While still holding in the ED he was noted to be more lethargic and and less responsive therefore he was intubated for airway protection.  PCCM consulted  Pertinent  Medical History  chronic combined systolic and diastolic CHF, atrial fibrillation/flutter, T2DM, HLD, HTN, OSA not on CPAP, obesity hypoventilation syndrome, former smoker, and  polycythemia   Significant Hospital Events: Including procedures, antibiotic start and stop dates in addition to other pertinent events   2/3: Admitted to hospitalist service with acute on chronic hypoxic hypercapnic respiratory failure in the setting of CHF exacerbation. Failed BiPAP requiring intubation.  PCCM consulted 2/4: remains intubated, on diuresis, PICC line placed 2/5: aggressively diuresed, improved oxygen requirements 2/6: remains intubated, Co-ox 67% this AM. Kidney function improved 2/7: intubated, follows commands, I/O -22L, kidney function improved 2/8: creatinine bumped, follows commands off sedation, I/O -23L 2/9 remains on vent, I/O -26L.   Interim History / Subjective:  Remains intubated this AM.  Remains on minimal vent settings.  Following commands this AM.  CVP appears elevated   Objective   Blood pressure 123/78, pulse 80, temperature (!) 101.1 F (38.4 C), resp. rate 20, weight (!) 168.5 kg, SpO2 95%. CVP:  [11 mmHg-34 mmHg] 13 mmHg  Vent Mode: PRVC FiO2 (%):  [40 %-85 %] 60 % Set Rate:  [20 bmp] 20 bmp Vt Set:  [450 mL-500 mL] 450 mL PEEP:  [10 cmH20] 10 cmH20 Plateau Pressure:  [22 cmH20-25 cmH20] 25 cmH20   Intake/Output Summary (Last 24 hours) at 03/27/2023 0752 Last data filed at 03/27/2023 0656 Gross per 24 hour  Intake 4481.64 ml  Output 3025 ml  Net 1456.64 ml   Filed Weights   03/25/23 0451 03/26/23 0426 03/27/23 0500  Weight: 122 kg 122 kg (!) 168.5 kg    Examination: General: Awake and following commands. Remains intubated.  HENT: Supple neck, reactive pupils Lungs: Coarse breath sounds bilaterally  Cardiovascular:  Normal S1, Normal S2, irregularly irregular rhythm  Abdomen: Soft, non tender, non distended Extremities: trace lower extremities edema.   Wbc 6.7  Hgb 17.9  Hct58 Cr 1.59  BUN 43   CXR 02/05 - Low lung volume   Is/Os + 1.1L in 24 hours. -24L in stay.   Assessment & Plan:  Case of a 48 year old male patient with  a past medical history of morbid obesity, chronic combined systolic and diastolic congestive heart failure EF 45 to 50% in 2025, moderately reduced RV systolic function with mildly elevated pulmonary artery systolic pressures, OSA not on CPAP, hypertension, hyperlipidemia and type 2 diabetes mellitus, paroxysmal A-fib on Xarelto  who presented to [facility] on 03/20/2023 with acute on chronic hypoxic hypercapnic respiratory failure requiring intubation and mechanical ventilation on 03/20/2023.  Thought to be secondary to decompensated heart failure with significant fluid overload.  Aggressively diurese with current overall state -24 L.  #Acute on chronic hypoxic hypercapneic respiratory failure secondary to...  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  #Paroxysmal A.fib on eliquis  #Febrile overnight to 101 and this morning. Abx broadened overnight.   GEN: Precedex  and fentanyl  for analgosedation RASS 0 to -1 and CPOT < 2. Versed  2mg  IV prn for agitation. Daily SAT.  CVS: Continue with Furosemide  80mg  IV BID for -1 to 1.5L in 24hrs. C/w Spirinolactone 12.5 mg daily. Heparin  drip.  Lungs: Minimal Vent settings. SBT daily.  GI: Lax PRN, Last bowel movement?. Protonix  for prophylaxis.  ID: CXR, Sputum cultures. Line holidaty today. (Foley exchange? DC Picc line?)  Renal: Diuresis as above. Replete Electrolytes as needed.  Heme: On heparin  drip.   Best Practice (right click and "Reselect all SmartList Selections" daily)   Diet/type: tubefeeds DVT prophylaxis systemic heparin  Pressure ulcer(s): N/A GI prophylaxis: PPI Lines: Central line Foley:  Yes, and it is still needed Code Status:  full code  Last date of multidisciplinary goals of care discussion [03/27/2023]  I spent 60 minutes caring for this patient today, including preparing to see the patient, obtaining a medical history ,  reviewing a separately obtained history, performing a medically appropriate examination and/or evaluation, counseling and educating the patient/family/caregiver, ordering medications, tests, or procedures, documenting clinical information in the electronic health record, and independently interpreting results (not separately reported/billed) and communicating results to the patient/family/caregiver  Annitta Kindler, MD [facility] 03/27/2023 8:24 AM

## 2023-03-27 NOTE — Progress Notes (Signed)
 ANTICOAGULATION CONSULT NOTE  Pharmacy Consult for Heparin  Infusion Indication: atrial fibrillation  Patient Measurements: Weight: 122 kg (268 lb 15.4 oz) (patient bed scale says 177kg, unable to rezero) Heparin  Dosing Weight: 101.6 kg  Labs: Recent Labs    03/25/23 0426 03/25/23 1807 03/26/23 0422 03/26/23 0534 03/26/23 1726 03/27/23 0358  HGB 18.2*  --  16.8  --   --  17.9*  HCT 56.7*  --  53.9*  --   --  58.4*  PLT 168  --  157  --   --  145*  APTT  --   --  92*  --   --   --   HEPARINUNFRC 0.34  --  0.42  --   --  0.44  CREATININE 1.65*   < >  --  1.64* 1.76* 1.59*   < > = values in this interval not displayed.    Estimated Creatinine Clearance: 69.2 mL/min (A) (by C-G formula based on SCr of 1.59 mg/dL (H)).   Medical History: Past Medical History:  Diagnosis Date   Atrial flutter (HCC)    a. CHA2DS2VASc = 4-->xarelto .   CAD (coronary artery disease)    a. 04/2020 Cath: LM nl, LAD 30ost/p, LCX nl, RCA 30p/m.   Chronic combined systolic and diastolic CHF (congestive heart failure) (HCC)    a. 03/2020 Echo: EF 40-45%; b. 05/2021 Echo: EF 40-45%; c. 10/2022 Echo: EF 55-60%, no rwma, sev LVH, RVSP 23.1 mmHg.   Diabetes mellitus without complication (HCC)    Hyperlipidemia    Hypertension    NICM (nonischemic cardiomyopathy) (HCC)    Nonadherence to medication    Obesity    Sleep apnea, obstructive     Medications:  PTA Meds: Rivaroxaban  20 mg qpm, last dose 03/20/23 @ 1637  Assessment: Pt is a 48 yo male with h/o A. Fib, on Xarelto , admitted for CHF exacerbation, now found with "elevated troponins likely demand ischemia," being transitioned to heparin  infusion. Cardiology consulted.  Goal of Therapy:  Heparin  level 0.3-0.7 units/ml aPTT 66-102 seconds Monitor platelets by anticoagulation protocol: Yes  0204 2123 aPTT 52, subtherapeutic 0205 0419 aPTT 80, therapeutic x 1 / HL >1.1, not correlating 0205 1227 aPTT 78, therapeutic x 2 0206 0516 aPTT 79,  therapeutic x 3 / HL 0.66, correlating x 1 0207 0421 aPTT 74, therapeutic x 4 / HL 0.49, correlating x 2 0208 0426 HL 0.34, therapeutic x 5 0209 0422 HL 0.42, therapeutic x 6 0210 0358 HL 0.44, therapeutic x 7   Plan:  --Continue heparin  infusion at 1800 units/hr --HL, CBC tomorrow AM  Coretta Dexter, PharmD, [facility] 03/27/2023 5:04 AM

## 2023-03-27 NOTE — Plan of Care (Signed)
  Problem: Fluid Volume: Goal: Ability to maintain a balanced intake and output will improve Outcome: Progressing   Problem: Metabolic: Goal: Ability to maintain appropriate glucose levels will improve Outcome: Progressing   Problem: Clinical Measurements: Goal: Diagnostic test results will improve Outcome: Progressing Goal: Respiratory complications will improve Outcome: Progressing   Problem: Elimination: Goal: Will not experience complications related to bowel motility Outcome: Progressing

## 2023-03-27 NOTE — Progress Notes (Addendum)
 Nutrition Follow Up Note   DOCUMENTATION CODES:   Morbid obesity  INTERVENTION:   Continue Vital HP @60ml /hr continuous +  ProSource TF 20- Give 60ml daily via tube  Free water  flushes 30ml q4 hours to maintain tube patency   Regimen provides 1520kcal/day, 146g/day protein and 1334ml/day of free water .   Daily weights   NUTRITION DIAGNOSIS:   Inadequate oral intake related to inability to eat (pt sedated and ventilated) as evidenced by NPO status. -ongoing   GOAL:   Provide needs based on ASPEN/SCCM guidelines -met with tube feeds   MONITOR:   Vent status, Labs, Weight trends, TF tolerance, I & O's, Skin  ASSESSMENT:   48 y.o. male with medical history significant of DM, hyperlipidemia, sleep apnea (CPAP broken), HTN, paroxysmal atrial flutter, morbid obesity, polycythemia, CAD, CKD III, marijuana use, possible obesity hypoventilation syndrome, previous tobacco use and chronic HFpEF who is admitted with acute respiratory failure with hypoxia, acute on chronic HFpEF, NSTEMI and AKI.  Pt remains sedated and ventilated. OGT placed 2/5. Pt is tolerating tube feeds at goal rate. No BM since admission; plan is for suppository today. Per chart, pt is down ~75lbs since admission. Pt -24.8L on his I & Os. Edema improved significantly. RD unsure of pt's UBW. Pt undergoing SBTs today. Pt noted to have tremors.   Medications reviewed and include: colace, lasix , protonix , miralax , senokot, aldactone , cefepime , heparin , vancomycin    Labs reviewed: K 4.3 wnl, BUN 43(H), creat 1.59(H) Cbgs- 195, 179, 153 x 24 hrs   Patient is currently intubated on ventilator support MV: 8.6 L/min Temp (24hrs), Avg:101.1 F (38.4 C), Min:98.4 F (36.9 C), Max:102.3 F (39.1 C)  Propofol : stopped   MAP >54mmHg   UOP-   NUTRITION - FOCUSED PHYSICAL EXAM:  Flowsheet Row Most Recent Value  Orbital Region No depletion  Upper Arm Region No depletion  Thoracic and Lumbar Region No  depletion  Buccal Region No depletion  Temple Region No depletion  Clavicle Bone Region No depletion  Clavicle and Acromion Bone Region No depletion  Scapular Bone Region No depletion  Dorsal Hand No depletion  Patellar Region No depletion  Anterior Thigh Region No depletion  Posterior Calf Region No depletion  Edema (RD Assessment) Mild  Hair Reviewed  Eyes Reviewed  Mouth Reviewed  Skin Reviewed  Nails Reviewed   Diet Order:   Diet Order     None      EDUCATION NEEDS:   Not appropriate for education at this time  Skin:  Skin Assessment: Reviewed RN Assessment  Last BM:  PTA  Height:   Ht Readings from Last 1 Encounters:  03/13/23 5\' 6"  (1.676 m)    Weight:   Wt Readings from Last 1 Encounters:  03/27/23 (!) 168.5 kg    Ideal Body Weight:  64.5 kg  BMI:  Body mass index is 59.96 kg/m.  Estimated Nutritional Needs:   Kcal:  1420-1613kcal/day  Protein:  140-160g/day  Fluid:  1.7-1.9L/day  Torrance Freestone MS, RD, LDN If unable to be reached, please send secure chat to "RD inpatient" available from 8:00a-4:00p daily

## 2023-03-27 NOTE — Progress Notes (Addendum)
 Patient ID: Gregory Mckenzie, male   DOB: 1975-10-01, 48 y.o.   MRN: [id number]     ADVANCED HF ROUNDINGNOTE   Cardiologist: Sammy Crisp, MD  Chief Complaint: Heart Failure  Subjective:    Continues to diurese well. -3.3L UOP but net +1.1L. net -24 L since admission.   Remains intubated on FiO2 60%.  No co-ox. CVP 14 Creatinine 1.66 => 1.5 => 1.34=> 1.65=> 1.64 >1.59  Objective:   Weight Range: (!) 168.5 kg Body mass index is 59.96 kg/m.   Vital Signs:   Temp:  [98.4 F (36.9 C)-102.8 F (39.3 C)] 101.1 F (38.4 C) (02/10 0700) Pulse Rate:  [76-107] 80 (02/10 0700) Resp:  [16-29] 20 (02/10 0700) BP: (88-155)/(57-125) 123/78 (02/10 0700) SpO2:  [82 %-100 %] 95 % (02/10 0700) FiO2 (%):  [40 %-85 %] 60 % (02/10 0739) Weight:  [168.5 kg] 168.5 kg (02/10 0500) Last BM Date :  (PTA)  Weight change: Filed Weights   03/25/23 0451 03/26/23 0426 03/27/23 0500  Weight: 122 kg 122 kg (!) 168.5 kg    Intake/Output:   Intake/Output Summary (Last 24 hours) at 03/27/2023 0751 Last data filed at 03/27/2023 0656 Gross per 24 hour  Intake 4481.64 ml  Output 3025 ml  Net 1456.64 ml     CVP 14  Physical Exam   General:  ill appearing.   HEENT: +ETT, +OG Neck: supple. JVD difficult to see. Carotids 2+ bilat; no bruits. No lymphadenopathy or thyromegaly appreciated. Cor: PMI nondisplaced. Regular rate & rhythm. No rubs, gallops or murmurs. Lungs: coarse Abdomen: soft, nontender, nondistended. No hepatosplenomegaly. No bruits or masses. Good bowel sounds. Extremities: no cyanosis, clubbing, rash, edema. PICC RUE Neuro: intubated and sedated   Tele    NSR 70s + rare PVC (Personally reviewed)    Labs    CBC Recent Labs    03/26/23 0422 03/27/23 0358  WBC 6.1 6.7  HGB 16.8 17.9*  HCT 53.9* 58.4*  MCV 92.5 94.7  PLT 157 145*   Basic Metabolic Panel Recent Labs    40/98/11 0534 03/26/23 1726 03/27/23 0358  NA 141 141 143  K 4.1 4.3 4.3  CL 103 102 102   CO2 27 27 30   GLUCOSE 127* 161* 151*  BUN 37* 46* 43*  CREATININE 1.64* 1.76* 1.59*  CALCIUM  8.1* 8.5* 8.9  MG 2.4  --  2.8*   Liver Function Tests No results for input(s): "AST", "ALT", "ALKPHOS", "BILITOT", "PROT", "ALBUMIN" in the last 72 hours.  No results for input(s): "LIPASE", "AMYLASE" in the last 72 hours. Cardiac Enzymes No results for input(s): "CKTOTAL", "CKMB", "CKMBINDEX", "TROPONINI" in the last 72 hours.  BNP: BNP (last 3 results) Recent Labs    03/07/23 1207 03/09/23 0957 03/20/23 1046  BNP 2,259.3* 1,857.9* 1,596.6*    ProBNP (last 3 results) No results for input(s): "PROBNP" in the last 8760 hours.   D-Dimer No results for input(s): "DDIMER" in the last 72 hours. Hemoglobin A1C No results for input(s): "HGBA1C" in the last 72 hours. Fasting Lipid Panel Recent Labs    03/25/23 0426  TRIG 589*   Thyroid Function Tests No results for input(s): "TSH", "T4TOTAL", "T3FREE", "THYROIDAB" in the last 72 hours.  Invalid input(s): "FREET3"  Other results:   Imaging    No results found.    Medications:     Scheduled Medications:  atorvastatin   80 mg Oral QHS   Chlorhexidine  Gluconate Cloth  6 each Topical Daily   docusate  100 mg Oral  BID   feeding supplement (PROSource TF20)  60 mL Per Tube Daily   free water   30 mL Per Tube Q4H   furosemide   80 mg Intravenous BID   ipratropium-albuterol   3 mL Nebulization Q6H   mouth rinse  15 mL Mouth Rinse Q2H   pantoprazole  (PROTONIX ) IV  40 mg Intravenous Q24H   polyethylene glycol  17 g Oral Daily   sodium chloride  flush  10-40 mL Intracatheter Q12H   spironolactone   12.5 mg Oral Daily    Infusions:  ceFEPime  (MAXIPIME ) IV Stopped (03/27/23 0426)   dexmedetomidine  (PRECEDEX ) IV infusion 1.2 mcg/kg/hr (03/27/23 0656)   feeding supplement (VITAL HIGH PROTEIN) 60 mL/hr at 03/27/23 0656   fentaNYL  infusion INTRAVENOUS 150 mcg/hr (03/27/23 0656)   heparin  1,800 Units/hr (03/27/23 0656)   propofol   (DIPRIVAN ) infusion Stopped (03/25/23 1044)   vancomycin       PRN Medications: acetaminophen , albuterol , bisacodyl , fentaNYL , ibuprofen , midazolam , ondansetron  **OR** ondansetron  (ZOFRAN ) IV, sodium chloride  flush    Patient Profile  Gregory Mckenzie is 53 uyear old with history HFpEF, DMII, HLD, OSA, HTN, PAF, chronic hypoxic respiratory failure on 2-4 liters, and polycythemia. 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 Family history of HTN Dad/Brother. Brother also had PE.   Admitted with A/C Hypoxic Respiratory Failure and A/C HFpEF Assessment/Plan  1. Acute on chronic HF with mid-range EF: Significant RV dysfunction and likely significant pulmonary hypertension in the setting of OHS/OSA. Echo this admission showed EF 45-50%, severe LVH, moderate RV enlargement and moderate RV dysfunction, PASP 44 mmHg.  -Net +yesterday. -24 L since admission. CVP 14 - check co-ox - SCr stable around his baseline 1.36>>1.65>>1.63>>1.64>1.59 today  ( baseline 1.4-1.6) - Continue 80 IV lasix  BID + diamox  250 mg  - Continue spironolactone  12.5 mg daily - After extubation and further diuresis, consider RHC to assess filling pressures/PA pressure.  - Continue follow daily weights, strict I's and O's and renal function while diuresing  2. Hypotension/shock: Suspect this is due to combination of RV failure and sedation with intubation.   - stable off levo.   3. Acute hypoxemic/hypercarbic respiratory failure: In setting of OHS/OSA (on home oxygen 3-4 L, has not had CPAP for 6 months) and CHF/volume overload.  Now intubated, respiratory mechanics improving with diuresis. FiO2 0.6.  - Continue diuresis - Vent per CCM, hopefully extubate soon.  - Will need to get back on CPAP at home.   4. CKD stage 3a: Creatinine 1.66 => 1.5 => 1.34 => 1.65=> 1.63=> 1.64> 1.59 today - SCr baseline (1.4-1.6).   - follow with diuresis  5. Polycythemia: Likely due to chronic hypoxemia.   6. Elevated troponin: Mild elevation, no  trend.  - Had cath in 2022 with minimal nonobstructive CAD.  - Continue atorvastatin  80 mg daily  7. Atrial fibrillation:  -this is paroxysmal  -Currently maintaining normal sinus rhythm on telemetry -On Xarelto  at home but this is currently held -Continue heparin  gtt while intubated.   8. DM2: SSI.    Sheryl Donna AGACNP-BC  03/27/2023 7:51 AM  Agree with above.   Awake on vent at 60%. Follows commands. CVP remains high No co-ox drawn   Diuresing well but still net positive overnight.   Having fevers  Remains in NSR. On heparin  gtt  General:  Ill appearing Awake on vent following commands HEENT: +ETT  bloody secretions Neck: thick supple Cor:  Regular rate & rhythm. 2/6 TR Lungs: coarse Abdomen: obese soft, nontender, nondistended. No hepatosplenomegaly. No bruits or masses.  Good bowel sounds. Extremities: no cyanosis, clubbing, rash, 1+ edema cool  Neuro: awake on vent follows commands  He remains critically ill. Volume status improving But O2 requirements going.   CVP 15. Co-ox and CXR pending  He has R>L HF in setting of cor pulmonale. At this point doubt CHF is major barrier to extubation. Will continue diuresis. Abx per primary team. If co-ox < 55% add milrinone. Continue heparin  for AF.   RHC when extubated. Sooner if unable to extubate.   CRITICAL CARE Performed by: Jules Oar  Total critical care time: 45 minutes  Critical care time was exclusive of separately billable procedures and treating other patients.  Critical care was necessary to treat or prevent imminent or life-threatening deterioration.  Critical care was time spent personally by me (independent of midlevel providers or residents) on the following activities: development of treatment plan with patient and/or surrogate as well as nursing, discussions with consultants, evaluation of patient's response to treatment, examination of patient, obtaining history from patient or surrogate,  ordering and performing treatments and interventions, ordering and review of laboratory studies, ordering and review of radiographic studies, pulse oximetry and re-evaluation of patient's condition.  You received the medication sugammadex (Bridion) while in the operating room. This medication can interact with hormonal forms of birth control.  Jules Oar, MD  8:18 AM

## 2023-03-27 NOTE — Progress Notes (Signed)
 Patient has had bloody oral secretions all day. MD aware. Awake and interactive all day even on sedation. Had small BM after Duccolox suppository given. Output good.

## 2023-03-28 ENCOUNTER — Inpatient Hospital Stay: Payer: Medicaid Other

## 2023-03-28 DIAGNOSIS — J9601 Acute respiratory failure with hypoxia: Secondary | ICD-10-CM | POA: Diagnosis not present

## 2023-03-28 DIAGNOSIS — I5023 Acute on chronic systolic (congestive) heart failure: Secondary | ICD-10-CM | POA: Diagnosis not present

## 2023-03-28 DIAGNOSIS — R509 Fever, unspecified: Secondary | ICD-10-CM | POA: Diagnosis not present

## 2023-03-28 DIAGNOSIS — N1831 Chronic kidney disease, stage 3a: Secondary | ICD-10-CM | POA: Diagnosis not present

## 2023-03-28 LAB — CBC
HCT: 62.7 % — ABNORMAL HIGH (ref 39.0–52.0)
Hemoglobin: 19.3 g/dL — ABNORMAL HIGH (ref 13.0–17.0)
MCH: 29 pg (ref 26.0–34.0)
MCHC: 30.8 g/dL (ref 30.0–36.0)
MCV: 94.3 fL (ref 80.0–100.0)
Platelets: 153 10*3/uL (ref 150–400)
RBC: 6.65 MIL/uL — ABNORMAL HIGH (ref 4.22–5.81)
RDW: 20.1 % — ABNORMAL HIGH (ref 11.5–15.5)
WBC: 9.4 10*3/uL (ref 4.0–10.5)
nRBC: 0 % (ref 0.0–0.2)

## 2023-03-28 LAB — GLUCOSE, CAPILLARY
Glucose-Capillary: 123 mg/dL — ABNORMAL HIGH (ref 70–99)
Glucose-Capillary: 136 mg/dL — ABNORMAL HIGH (ref 70–99)
Glucose-Capillary: 138 mg/dL — ABNORMAL HIGH (ref 70–99)
Glucose-Capillary: 160 mg/dL — ABNORMAL HIGH (ref 70–99)
Glucose-Capillary: 187 mg/dL — ABNORMAL HIGH (ref 70–99)
Glucose-Capillary: 196 mg/dL — ABNORMAL HIGH (ref 70–99)

## 2023-03-28 LAB — COOXEMETRY PANEL
Carboxyhemoglobin: 1.3 % (ref 0.5–1.5)
Methemoglobin: <0.7 % (ref 0.0–1.5)
O2 Saturation: 83.4 %
Total hemoglobin: 15.3 g/dL (ref 12.0–16.0)
Total oxygen content: 81.8 %

## 2023-03-28 LAB — HEPARIN LEVEL (UNFRACTIONATED): Heparin Unfractionated: 0.35 [IU]/mL (ref 0.30–0.70)

## 2023-03-28 LAB — BASIC METABOLIC PANEL
Anion gap: 13 (ref 5–15)
BUN: 41 mg/dL — ABNORMAL HIGH (ref 6–20)
CO2: 28 mmol/L (ref 22–32)
Calcium: 9.1 mg/dL (ref 8.9–10.3)
Chloride: 102 mmol/L (ref 98–111)
Creatinine, Ser: 1.53 mg/dL — ABNORMAL HIGH (ref 0.61–1.24)
GFR, Estimated: 55 mL/min — ABNORMAL LOW (ref >60)
Glucose, Bld: 151 mg/dL — ABNORMAL HIGH (ref 70–99)
Potassium: 4.1 mmol/L (ref 3.5–5.1)
Sodium: 143 mmol/L (ref 135–145)

## 2023-03-28 LAB — PHOSPHORUS: Phosphorus: 2.8 mg/dL (ref 2.5–4.6)

## 2023-03-28 LAB — MAGNESIUM: Magnesium: 2.8 mg/dL — ABNORMAL HIGH (ref 1.7–2.4)

## 2023-03-28 MED ORDER — CARVEDILOL 3.125 MG PO TABS
3.1250 mg | ORAL_TABLET | Freq: Two times a day (BID) | ORAL | Status: DC
  Administered 2023-03-28 – 2023-03-29 (×2): 3.125 mg via ORAL
  Filled 2023-03-28 (×2): qty 1

## 2023-03-28 MED ORDER — ADULT MULTIVITAMIN W/MINERALS CH
1.0000 | ORAL_TABLET | Freq: Every day | ORAL | Status: DC
  Administered 2023-03-29 – 2023-04-03 (×5): 1 via ORAL
  Filled 2023-03-28 (×5): qty 1

## 2023-03-28 MED ORDER — FUROSEMIDE 10 MG/ML IJ SOLN
80.0000 mg | Freq: Once | INTRAMUSCULAR | Status: AC
  Administered 2023-03-28: 80 mg via INTRAVENOUS
  Filled 2023-03-28: qty 8

## 2023-03-28 MED ORDER — SENNOSIDES 8.8 MG/5ML PO SYRP
10.0000 mL | ORAL_SOLUTION | Freq: Every day | ORAL | Status: DC
  Administered 2023-03-28 – 2023-04-02 (×4): 10 mL via ORAL
  Filled 2023-03-28 (×6): qty 10

## 2023-03-28 MED ORDER — ATORVASTATIN CALCIUM 80 MG PO TABS
80.0000 mg | ORAL_TABLET | Freq: Every day | ORAL | Status: DC
  Administered 2023-03-28 – 2023-04-02 (×6): 80 mg via ORAL
  Filled 2023-03-28 (×6): qty 1

## 2023-03-28 MED ORDER — DOCUSATE SODIUM 100 MG PO CAPS
100.0000 mg | ORAL_CAPSULE | Freq: Two times a day (BID) | ORAL | Status: DC
Start: 2023-03-28 — End: 2023-04-03
  Administered 2023-03-28 – 2023-04-02 (×4): 100 mg via ORAL
  Filled 2023-03-28 (×8): qty 1

## 2023-03-28 MED ORDER — ENSURE ENLIVE PO LIQD
237.0000 mL | Freq: Three times a day (TID) | ORAL | Status: DC
  Administered 2023-03-28 – 2023-04-03 (×14): 237 mL via ORAL

## 2023-03-28 MED ORDER — TORSEMIDE 20 MG PO TABS: 40.0000 mg | ORAL_TABLET | Freq: Every day | ORAL | Status: DC

## 2023-03-28 MED ORDER — ACETAMINOPHEN 325 MG PO TABS: 975.0000 mg | ORAL_TABLET | Freq: Four times a day (QID) | ORAL | Status: DC | PRN

## 2023-03-28 MED ORDER — POLYETHYLENE GLYCOL 3350 17 G PO PACK
17.0000 g | PACK | Freq: Every day | ORAL | Status: DC
  Administered 2023-03-29: 17 g via ORAL
  Filled 2023-03-28 (×5): qty 1

## 2023-03-28 MED ORDER — SPIRONOLACTONE 12.5 MG HALF TABLET
12.5000 mg | ORAL_TABLET | Freq: Every day | ORAL | Status: DC
  Administered 2023-03-29 – 2023-04-03 (×6): 12.5 mg via ORAL
  Filled 2023-03-28 (×6): qty 1

## 2023-03-28 NOTE — Plan of Care (Signed)
Problem: Metabolic: Goal: Ability to maintain appropriate glucose levels will improve Outcome: Progressing   Problem: Nutritional: Goal: Maintenance of adequate nutrition will improve Outcome: Progressing Goal: Progress toward achieving an optimal weight will improve Outcome: Progressing   Problem: Skin Integrity: Goal: Risk for impaired skin integrity will decrease Outcome: Progressing   Problem: Clinical Measurements: Goal: Diagnostic test results will improve Outcome: Progressing Goal: Respiratory complications will improve Outcome: Progressing

## 2023-03-28 NOTE — Progress Notes (Signed)
Inpatient Rehab Admissions Coordinator:   Per therapy recommendations pt was screened for CIR by Estill Dooms, PT, DPT.  Note pt on 40L HHFNC, which is well outside of the parameters CIR can manage.  He is min assist/CGA with mobility and ADLs, though note evaluations limited some by O2 tubing/needs.  I will not place a consult at this time but will follow for reduced O2 needs and mobility progress.    Estill Dooms, PT, DPT Admissions Coordinator [phone] 03/28/23  4:19 PM

## 2023-03-28 NOTE — Plan of Care (Signed)
  Problem: Health Behavior/Discharge Planning: Goal: Ability to manage health-related needs will improve Outcome: Progressing   Problem: Metabolic: Goal: Ability to maintain appropriate glucose levels will improve Outcome: Progressing   Problem: Skin Integrity: Goal: Risk for impaired skin integrity will decrease Outcome: Progressing   Problem: Clinical Measurements: Goal: Respiratory complications will improve Outcome: Progressing   Problem: Nutrition: Goal: Adequate nutrition will be maintained Outcome: Progressing

## 2023-03-28 NOTE — Progress Notes (Signed)
NP has been notified that the patient has a air leak around his cuff. NP has seen the patient with Dr. West Bali. Pt to be extubated this morning.

## 2023-03-28 NOTE — Progress Notes (Addendum)
Patient ID: Gregory Mckenzie, male   DOB: March 22, 1975, 48 y.o.   MRN: [id number]     ADVANCED HF ROUNDINGNOTE   Cardiologist: Yvonne Kendall, MD  Chief Complaint: Heart Failure  Subjective:   Diuresed well with IV lasix and diamox. -4.6L UOP net -1.4L. net -25 L since admission.    Now extubated. CVP ~8  Extubated this morning. Denies CP/SOB.   Objective:   Weight Range: (!) 164 kg Body mass index is 58.36 kg/m.   Vital Signs:   Temp:  [98.6 F (37 C)-103 F (39.4 C)] 101.9 F (38.8 C) (02/11 0406) Pulse Rate:  [75-115] 99 (02/11 0745) Resp:  [14-33] 20 (02/11 0715) BP: (89-171)/(49-153) 130/102 (02/11 0745) SpO2:  [90 %-100 %] 99 % (02/11 0745) FiO2 (%):  [50 %-80 %] 80 % (02/11 0745) Weight:  [164 kg] 164 kg (02/11 0408) Last BM Date : 03/27/23  Weight change: Filed Weights   03/26/23 0426 03/27/23 0500 03/28/23 0408  Weight: 122 kg (!) 168.5 kg (!) 164 kg    Intake/Output:   Intake/Output Summary (Last 24 hours) at 03/28/2023 0824 Last data filed at 03/28/2023 0802 Gross per 24 hour  Intake 3323.46 ml  Output 4325 ml  Net -1001.54 ml     CVP 8  Physical Exam  General:  weak appearing.   HEENT: +Bipap Neck: supple. JVD difficult to see. Carotids 2+ bilat; no bruits. No lymphadenopathy or thyromegaly appreciated. Cor: PMI nondisplaced. Regular rate & rhythm. No rubs, gallops or murmurs. Lungs: coarse at bases Abdomen: soft, nontender, nondistended. No hepatosplenomegaly. No bruits or masses. Good bowel sounds. Extremities: no cyanosis, clubbing, rash, edema. PICC RUE Neuro: alert & oriented x 3, cranial nerves grossly intact. moves all 4 extremities w/o difficulty. Affect pleasant.  Tele    NSR 90s (Personally reviewed)    Labs    CBC Recent Labs    03/27/23 0358 03/28/23 0340  WBC 6.7 9.4  HGB 17.9* 19.3*  HCT 58.4* 62.7*  MCV 94.7 94.3  PLT 145* 153   Basic Metabolic Panel Recent Labs    95/28/41 1707 03/28/23 0504  NA 146* 143   K 4.0 4.1  CL 106 102  CO2 29 28  GLUCOSE 141* 151*  BUN 38* 41*  CREATININE 1.46* 1.53*  CALCIUM 8.5* 9.1  MG 2.5* 2.8*  PHOS  --  2.8   Liver Function Tests No results for input(s): "AST", "ALT", "ALKPHOS", "BILITOT", "PROT", "ALBUMIN" in the last 72 hours.  No results for input(s): "LIPASE", "AMYLASE" in the last 72 hours. Cardiac Enzymes No results for input(s): "CKTOTAL", "CKMB", "CKMBINDEX", "TROPONINI" in the last 72 hours.  BNP: BNP (last 3 results) Recent Labs    03/07/23 1207 03/09/23 0957 03/20/23 1046  BNP 2,259.3* 1,857.9* 1,596.6*    ProBNP (last 3 results) No results for input(s): "PROBNP" in the last 8760 hours.   D-Dimer No results for input(s): "DDIMER" in the last 72 hours. Hemoglobin A1C No results for input(s): "HGBA1C" in the last 72 hours. Fasting Lipid Panel No results for input(s): "CHOL", "HDL", "LDLCALC", "TRIG", "CHOLHDL", "LDLDIRECT" in the last 72 hours.  Thyroid Function Tests No results for input(s): "TSH", "T4TOTAL", "T3FREE", "THYROIDAB" in the last 72 hours.  Invalid input(s): "FREET3"  Other results:   Imaging    No results found.    Medications:     Scheduled Medications:  atorvastatin  80 mg Per Tube QHS   Chlorhexidine Gluconate Cloth  6 each Topical Daily   docusate  100  mg Per Tube BID   feeding supplement (PROSource TF20)  60 mL Per Tube Daily   free water  30 mL Per Tube Q4H   insulin aspart  2-6 Units Subcutaneous Q4H   ipratropium-albuterol  3 mL Nebulization Q6H   mouth rinse  15 mL Mouth Rinse Q2H   pantoprazole (PROTONIX) IV  40 mg Intravenous Q24H   polyethylene glycol  17 g Per Tube Daily   sennosides  10 mL Per Tube QHS   sodium chloride flush  10-40 mL Intracatheter Q12H   spironolactone  12.5 mg Per Tube Daily   torsemide  40 mg Oral Daily    Infusions:  ceFEPime (MAXIPIME) IV Stopped (03/28/23 0409)   feeding supplement (VITAL HIGH PROTEIN) 60 mL/hr at 03/28/23 0802   heparin 1,800  Units/hr (03/28/23 0802)   vancomycin Stopped (03/27/23 1245)    PRN Medications: acetaminophen, albuterol, bisacodyl, fentaNYL (SUBLIMAZE) injection, fentaNYL (SUBLIMAZE) injection, midazolam, ondansetron **OR** ondansetron (ZOFRAN) IV, sodium chloride flush    Patient Profile  Gregory Mckenzie is 24 uyear old with history HFpEF, DMII, HLD, OSA, HTN, PAF, chronic hypoxic respiratory failure on 2-4 liters, and polycythemia. 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 Family history of HTN Dad/Brother. Brother also had PE.   Admitted with A/C Hypoxic Respiratory Failure and A/C HFpEF Assessment/Plan  1. Acute on chronic HF with mid-range EF: Significant RV dysfunction and likely significant pulmonary hypertension in the setting of OHS/OSA. Echo this admission showed EF 45-50%, severe LVH, moderate RV enlargement and moderate RV dysfunction, PASP 44 mmHg.  - CVP ~8 today. Stop IV diuretics with bad RV. Start Torsemide 40 mg daily, may need to increase but don't want to over diurese.  - Co-ox 83% - SCr stable around his baseline - Continue spironolactone 12.5 mg daily - will up titrate/add further GDMT when tolerating PO - Consider RHC to assess filling pressures/PA pressure.  - Continue follow daily weights, strict I's and O's and renal function while diuresing  2. Hypotension/shock: Suspect this is due to combination of RV failure and sedation with intubation.   - stable off levo.   3. Acute hypoxemic/hypercarbic respiratory failure: In setting of OHS/OSA (on home oxygen 3-4 L, has not had CPAP for 6 months) and CHF/volume overload.  Required intubation.  - Continue diuresis as above - Will need to get back on CPAP at home. - Extubated this morning, placed on bipap   4. CKD stage 3a: Creatinine at baseline - SCr baseline (1.4-1.6).   - follow with diuresis  5. Polycythemia: Likely due to chronic hypoxemia.   6. Elevated troponin: Mild elevation, no trend.  - Had cath in 2022 with minimal  nonobstructive CAD.  - Continue atorvastatin 80 mg daily  7. Atrial fibrillation:  -this is paroxysmal  -Currently maintaining normal sinus rhythm on telemetry -On Xarelto at home but this is currently held -Continue heparin gtt, can likely switch to xarelto once he's swallowing  8. DM2: SSI.   9. ID - blood cultures 2/10 NGTD - TMax 103 - consider sputum culture - with elevated co-ox suspect sepsis - Continue cefepime and vanc, may need to broaden more  Consult PT/OT. Consult speech for swallow eval post extubation.   Addendum 03/28/23 2:13 PM On afternoon rounds patient still has not been seen by speech. Will cancel PO Torsemide and order 80 IV lasix x1. Plan to reevaluate diuretics tomorrow.   Alen Bleacher AGACNP-BC  03/28/2023 8:24 AM

## 2023-03-28 NOTE — Evaluation (Addendum)
Physical Therapy Evaluation Patient Details Name: Gregory Mckenzie MRN: [id number] DOB: 06-25-1975 Today's Date: 03/28/2023  History of Present Illness  Pt is a 48 y/o M admitted on 03/20/23 after presenting from the heart failure clinic with c/c of acute respiratory distress. Pt required intubation & mechanical ventilation, extubated 03/28/23. Pt is being treated for acute on chronic hypoxic hypercapneic respiratory failure 2/2 CHF exacerbation in the setting of HFmrEF. PMH: chronic combined systolic & diastolic CHF, a-fib/flutter, DM2, HLD, HTN, OSA not on CPAP, obesity hypoventilation syndrome, former smoker, polycythemia  Clinical Impression  Pt seen for PT evaluation with pt agreeable to tx. 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 On this date, pt presents with somewhat decreased safety awareness, reporting he can mobilize on his own. Pt requires CGA for bed mobility, min assist +2 HHA for STS but pt is able to transfer to standing with RW & CGA. Pt tolerated standing ~2 minutes, gait limited by HHFNC tubing. Pt would benefit from ongoing PT services to address strengthening, balance, & gait to facilitate return to independent PLOF.    (SPO2 as low as 87% on 40L HHFNC, pt without c/o symptoms)    If plan is discharge home, recommend the following: A little help with walking and/or transfers;A little help with bathing/dressing/bathroom;Assistance with cooking/housework;Assist for transportation;Help with stairs or ramp for entrance;Supervision due to cognitive status   Can travel by private vehicle        Equipment Recommendations Other (comment) (defer to next venue)  Recommendations for Other Services       Functional Status Assessment Patient has had a recent decline in their functional status and demonstrates the ability to make significant improvements in function in a reasonable and predictable amount of time.     Precautions  / Restrictions Precautions Precautions: Fall Recall of Precautions/Restrictions: Impaired Restrictions Weight Bearing Restrictions Per Provider Order: No      Mobility  Bed Mobility Overal bed mobility: Needs Assistance Bed Mobility: Supine to Sit     Supine to sit: Contact guard, Used rails, HOB elevated          Transfers Overall transfer level: Needs assistance Equipment used: 1 person hand held assist, Rolling walker (2 wheels) Transfers: Sit to/from Stand Sit to Stand: Min assist, +2 physical assistance           General transfer comment: STS +2 from EOB with HHA +2, STS from recliner with RW & CGA, cuing re: hand placement    Ambulation/Gait                  Stairs            Wheelchair Mobility     Tilt Bed    Modified Rankin (Stroke Patients Only)       Balance Overall balance assessment: Needs assistance Sitting-balance support: No upper extremity supported, Feet supported Sitting balance-Leahy Scale: Fair     Standing balance support: Bilateral upper extremity supported, Reliant on assistive device for balance, During functional activity Standing balance-Leahy Scale: Fair                               Pertinent Vitals/Pain Pain Assessment Pain Assessment: No/denies pain    Home Living Family/patient expects to be discharged to:: Private residence Living Arrangements: Spouse/significant other Available Help at Discharge: Family Type of Home: Group Home Home Access: Level entry  Home Layout: One level   Additional Comments: 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    Prior Function Prior Level of Function : Independent/Modified Independent;Working/employed                     Extremity/Trunk Assessment   Upper Extremity Assessment Upper Extremity Assessment: Generalized weakness    Lower Extremity Assessment Lower Extremity Assessment:  Generalized weakness       Communication   Communication Communication: Impaired Factors Affecting Communication: Reduced clarity of speech    Cognition Arousal: Alert Behavior During Therapy: Impulsive   PT - Cognitive impairments: Awareness, Safety/Judgement                                 Cueing Cueing Techniques: Verbal cues     General Comments General comments (skin integrity, edema, etc.): SpO2 87% on 40L HHFNC    Exercises Other Exercises Other Exercises: Pt standing to perform marching, standing in place for increased BLE strengthening & increased activity tolerance with close supervision<>CGA.   Assessment/Plan    PT Assessment Patient needs continued PT services  PT Problem List Decreased strength;Cardiopulmonary status limiting activity;Decreased activity tolerance;Decreased balance;Decreased mobility;Decreased safety awareness;Decreased knowledge of use of DME;Decreased range of motion;Decreased cognition       PT Treatment Interventions Balance training;DME instruction;Modalities;Neuromuscular re-education;Stair training;Gait training;Functional mobility training;Therapeutic activities;Therapeutic exercise;Patient/family education;Manual techniques;Cognitive remediation    PT Goals (Current goals can be found in the Care Plan section)  Acute Rehab PT Goals Patient Stated Goal: get better, go home PT Goal Formulation: With patient Time For Goal Achievement: 04/11/23 Potential to Achieve Goals: Good    Frequency Min 1X/week     Co-evaluation PT/OT/SLP Co-Evaluation/Treatment: Yes Reason for Co-Treatment: For patient/therapist safety;Necessary to address cognition/behavior during functional activity PT goals addressed during session: Mobility/safety with mobility OT goals addressed during session: ADL's and self-care       AM-PAC PT "6 Clicks" Mobility  Outcome Measure Help needed turning from your back to your side while in a flat bed  without using bedrails?: A Little Help needed moving from lying on your back to sitting on the side of a flat bed without using bedrails?: A Little Help needed moving to and from a bed to a chair (including a wheelchair)?: A Little Help needed standing up from a chair using your arms (e.g., wheelchair or bedside chair)?: A Little Help needed to walk in hospital room?: A Lot Help needed climbing 3-5 steps with a railing? : A Lot 6 Click Score: 16    End of Session Equipment Utilized During Treatment: Oxygen Activity Tolerance: Patient tolerated treatment well Patient left: in chair;with call bell/phone within reach (in care of NT) Nurse Communication: Mobility status PT Visit Diagnosis: Muscle weakness (generalized) (M62.81);Other abnormalities of gait and mobility (R26.89);Difficulty in walking, not elsewhere classified (R26.2);Unsteadiness on feet (R26.81)    Time: 1610-9604 PT Time Calculation (min) (ACUTE ONLY): 17 min   Charges:   PT Evaluation $PT Eval Moderate Complexity: 1 Mod   PT General Charges $$ ACUTE PT VISIT: 1 Visit         Aleda Grana, PT, DPT 03/28/23, 3:52 PM   Sandi Mariscal 03/28/2023, 3:50 PM

## 2023-03-28 NOTE — Progress Notes (Signed)
Bladder scanned patient 455 ML. Orders per Dr. Larinda Buttery for in and out cath- 400 ml removed. Continue to assess.

## 2023-03-28 NOTE — Evaluation (Signed)
Occupational Therapy Evaluation Patient Details Name: Gregory Mckenzie MRN: [id number] DOB: [birth year] Today's Date: 03/28/2023   History of Present Illness   History of Present Illness: 48 y.o morbidly obese male presented to the ED from the heart failure clinic with chief complaints of acute respiratory distress and requiring intubation on 03/20/23. Now extubated on 03/28/23. PMH of chronic combined systolic and diastolic CHF, atrial fibrillation/flutter, T2DM, HLD, HTN, OSA not on CPAP, obesity hypoventilation syndrome, former smoker, and polycythemia.     Clinical Impressions Gregory Mckenzie was seen for OT evaluation this date. Prior to hospital admission, pt was IND. 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 Pt presents to acute OT demonstrating impaired ADL performance and functional mobility 2/2 decreased activity tolerance and functional strength/ROM deficits. Pt currently requires MIN A for oral care in sitting. CGA for bed mobility and sit<>stand. MIN A + HHA for bed>chair t/f. Cues for RW and safe t/f technique. SpO2 87% on 40L HFNC. Pt would benefit from skilled OT to address noted impairments and functional limitations (see below for any additional details). Upon hospital discharge, recommend OT follow up and initial SUPERVISION upon d/c as pt remains impulsive with poor insight into deficits.    If plan is discharge home, recommend the following:   A little help with walking and/or transfers;Help with stairs or ramp for entrance;A little help with bathing/dressing/bathroom     Functional Status Assessment   Patient has had a recent decline in their functional status and demonstrates the ability to make significant improvements in function in a reasonable and predictable amount of time.     Equipment Recommendations   BSC/3in1;Other (comment) (RW)     Recommendations for Other Services         Precautions/Restrictions   Precautions Precautions:  Fall Recall of Precautions/Restrictions: Impaired Restrictions Weight Bearing Restrictions Per Provider Order: No     Mobility Bed Mobility Overal bed mobility: Needs Assistance Bed Mobility: Supine to Sit     Supine to sit: Contact guard          Transfers Overall transfer level: Needs assistance Equipment used: Rolling walker (2 wheels) Transfers: Sit to/from Stand Sit to Stand: Contact guard assist                  Balance Overall balance assessment: Needs assistance Sitting-balance support: No upper extremity supported, Feet supported Sitting balance-Leahy Scale: Fair     Standing balance support: Bilateral upper extremity supported Standing balance-Leahy Scale: Fair                             ADL either performed or assessed with clinical judgement   ADL Overall ADL's : Needs assistance/impaired                                       General ADL Comments: MIN A for oral care in sitting. CGA for simulated BSC t/f.      Pertinent Vitals/Pain Pain Assessment Pain Assessment: No/denies pain     Extremity/Trunk Assessment Upper Extremity Assessment Upper Extremity Assessment: Generalized weakness   Lower Extremity Assessment Lower Extremity Assessment: Generalized weakness       Communication Communication Communication: Impaired Factors Affecting Communication: Reduced clarity of speech   Cognition Arousal: Alert Behavior During Therapy: Impulsive Cognition: Difficult to assess Difficult to assess due to: Impaired communication  OT - Cognition Comments: garbled speech (recent extubation)                 Following commands: Intact       Cueing  General Comments   Cueing Techniques: Verbal cues      Exercises     Shoulder Instructions      Home Living Family/patient expects to be discharged to:: Private residence Living Arrangements: Spouse/significant other Available Help at  Discharge: Family Type of Home: Group Home Home Access: Level entry     Home Layout: One level                   Additional Comments: 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[facility]"      Prior Functioning/Environment Prior Level of Function : Independent/Modified Independent;Working/employed                    OT Problem List: Decreased range of motion;Decreased strength;Decreased activity tolerance;Impaired balance (sitting and/or standing);Decreased safety awareness   OT Treatment/Interventions: Self-care/ADL training;Therapeutic exercise;Energy conservation;DME and/or AE instruction;Therapeutic activities;Balance training;Patient/family education      OT Goals(Current goals can be found in the care plan section)   Acute Rehab OT Goals Patient Stated Goal: to go home OT Goal Formulation: With patient/family Time For Goal Achievement: 04/11/23 Potential to Achieve Goals: Good ADL Goals Pt Will Perform Grooming: standing;Independently Pt Will Perform Lower Body Dressing: sit to/from stand;Independently Pt Will Transfer to Toilet: with modified independence;ambulating;regular height toilet   OT Frequency:  Min 1X/week    Co-evaluation PT/OT/SLP Co-Evaluation/Treatment: Yes Reason for Co-Treatment: For patient/therapist safety;Necessary to address cognition/behavior during functional activity PT goals addressed during session: Mobility/safety with mobility OT goals addressed during session: ADL's and self-care      AM-PAC OT "6 Clicks" Daily Activity     Outcome Measure Help from another person eating meals?: None Help from another person taking care of personal grooming?: A Little Help from another person toileting, which includes using toliet, bedpan, or urinal?: A Little Help from another person bathing (including washing, rinsing, drying)?: A Lot Help from another person to put on and taking off regular upper body clothing?: A Little Help  from another person to put on and taking off regular lower body clothing?: A Lot 6 Click Score: 17   End of Session Equipment Utilized During Treatment: Rolling walker (2 wheels) Nurse Communication: Mobility status  Activity Tolerance: Patient tolerated treatment well Patient left: in chair;with call bell/phone within reach;with family/visitor present;with nursing/sitter in room  OT Visit Diagnosis: Other abnormalities of gait and mobility (R26.89);Muscle weakness (generalized) (M62.81)                Time: 1610-9604 OT Time Calculation (min): 21 min Charges:  OT General Charges $OT Visit: 1 Visit OT Evaluation $OT Eval Moderate Complexity: 1 Mod  Kathie Dike, M.S. OTR/L  03/28/23, 2:51 PM  ascom [phone]

## 2023-03-28 NOTE — Progress Notes (Addendum)
NAME:  Gregory Mckenzie, MRN:  [id number], DOB:  22-Mar-1975, LOS: 8 ADMISSION DATE:  03/20/2023 History of Present Illness:  48 y.o morbidly obese male with significant PMH of chronic combined systolic and diastolic CHF, atrial fibrillation/flutter, T2DM, HLD, HTN, OSA not on CPAP, obesity hypoventilation syndrome, former smoker, and polycythemia who presented to the ED from the heart failure clinic with chief complaints of acute respiratory distress.   Per ED reports patient was seen in heart failure clinic for evaluation of shortness of breath with very little exertion, fatigue, worsening abdominal distention, cough, worsening pedal edema and weight gain.  He ran out of his oxygen last night around 9 PM.  When he was noted with sats in the lower 60s on room air she was placed on 3L which was increased to 5 L with only improvement up to 83%.  Patient was sent to the ED for further evaluation   ED Course: Initial vital signs showed HR of 86 beats/minute, BP 153/130 mm Hg, the RR 20 breaths/minute, and the oxygen saturation 84% on 5L and a temperature of 98.32F (37.1C).  Pertinent Labs/Diagnostics Findings: Na+/ K+: 140/4.8.  Glucose: 147.  BUN/Cr.:  44/2.19  WBC: 4.2 K/L COVID PCR: Negative,  troponin: 122~152~121  BNP:1596.6   VBG: pO2 47; pCO2 102; pH 7.19;  HCO3 39.0, %O2 Sat 58.6.  CXR> Cardiomegaly, vascular congestion  Medication administered in the ED: Disposition: Patient was given IV Lasix, placed on BiPAP and admitted to hospitalist service.  He however continued declined to wear his BiPAP.  He was given sedation with no improvement.  While still holding in the ED he was noted to be more lethargic and and less responsive therefore he was intubated for airway protection.  PCCM consulted  Pertinent  Medical History  chronic combined systolic and diastolic CHF, atrial fibrillation/flutter, T2DM, HLD, HTN, OSA not on CPAP, obesity hypoventilation syndrome, former smoker, and  polycythemia   Significant Hospital Events: Including procedures, antibiotic start and stop dates in addition to other pertinent events   2/3: Admitted to hospitalist service with acute on chronic hypoxic hypercapnic respiratory failure in the setting of CHF exacerbation. Failed BiPAP requiring intubation.  PCCM consulted 2/4: remains intubated, on diuresis, PICC line placed 2/5: aggressively diuresed, improved oxygen requirements 2/6: remains intubated, Co-ox 67% this AM. Kidney function improved 2/7: intubated, follows commands, I/O -22L, kidney function improved 2/8: creatinine bumped, follows commands off sedation, I/O -23L 2/9 remains on vent, I/O -26L.   Interim History / Subjective:  -- This AM with cuff leak, appears adeqaute for extubation, following commands, tolerated SBT.  -- CVP 8 - -1.4L in 24hrs.  -- Remains febrile to 101.   Objective   Blood pressure (!) 172/149, pulse 96, temperature 99.5 F (37.5 C), resp. rate 20, weight (!) 164 kg, SpO2 96%. CVP:  [4 mmHg-26 mmHg] 17 mmHg  Vent Mode: PRVC FiO2 (%):  [50 %-80 %] 80 % Set Rate:  [20 bmp] 20 bmp Vt Set:  [450 mL] 450 mL PEEP:  [8 cmH20-10 cmH20] 10 cmH20 Pressure Support:  [5 cmH20] 5 cmH20 Plateau Pressure:  [24 cmH20-26 cmH20] 26 cmH20   Intake/Output Summary (Last 24 hours) at 03/28/2023 0843 Last data filed at 03/28/2023 0826 Gross per 24 hour  Intake 3330.66 ml  Output 4325 ml  Net -994.34 ml   Filed Weights   03/26/23 0426 03/27/23 0500 03/28/23 0408  Weight: 122 kg (!) 168.5 kg (!) 164 kg    Examination: General: Awake and  following commands. Remains intubated.  HENT: Supple neck, reactive pupils Lungs: Coarse breath sounds bilaterally  Cardiovascular: Normal S1, Normal S2, irregularly irregular rhythm  Abdomen: Soft, non tender, non distended Extremities: trace lower extremities edema.   Wbc 6.7  Hgb 17.9  Hct58 Cr 1.59  BUN 43   CXR 02/05 - Low lung volume   Is/Os -1.4L in 24 hours.  -24L in stay.   Assessment & Plan:  Case of a 48 year old male patient with a past medical history of morbid obesity, chronic combined systolic and diastolic congestive heart failure EF 45 to 50% in 2025, moderately reduced RV systolic function with mildly elevated pulmonary artery systolic pressures, OSA not on CPAP, hypertension, hyperlipidemia and type 2 diabetes mellitus, paroxysmal A-fib on Xarelto who presented to [facility] on 03/20/2023 with acute on chronic hypoxic hypercapnic respiratory failure requiring intubation and mechanical ventilation on 03/20/2023.  Thought to be secondary to decompensated heart failure with significant fluid overload.  Aggressively diurese with current overall state -24 L.  #Acute on chronic hypoxic hypercapneic respiratory failure secondary to...  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  #Paroxysmal A.fib on eliquis  #Persistent fevers with differential including new infectious process vs drug fever (Precedex).  CXR 02/10 unremarkable. Blood cultures negative so far. WBC uptrending. DC foley cath.   Neuro: D/c Precedex, Fentanyl. Proceed to extubation.  CVS:Per cardiology switch IV diuresis to Home PO Torsemide 60 daily. C/w Spirinolactone 12.5 mg daily. GDMT per cardiology. Heparin drip.  Lungs: Proceed to extubation. CPAP at bedtime.  GI: Lax PRN, Last bowel movement?. Protonix for prophylaxis.  ID: CXR Unre, Sputum cultures. Line holidaty today. DC foleu cath and DC PICC line. Started on Vanc and Cefepime. Will likely DC tomorrow.  Renal: Diuresis as above. Replete Electrolytes as needed.  Heme: On heparin drip.   Best Practice (right click and "Reselect all SmartList Selections" daily)   Diet/type: tubefeeds DVT prophylaxis systemic heparin Pressure ulcer(s): N/A GI prophylaxis: PPI Lines: Central line Foley:  Yes, and it is still needed Code  Status:  full code  Last date of multidisciplinary goals of care discussion [03/28/2023]  I spent 50 minutes caring for this patient today, including preparing to see the patient, obtaining a medical history , reviewing a separately obtained history, performing a medically appropriate examination and/or evaluation, counseling and educating the patient/family/caregiver, ordering medications, tests, or procedures, documenting clinical information in the electronic health record, and independently interpreting results (not separately reported/billed) and communicating results to the patient/family/caregiver  Janann Colonel, MD [facility] 03/28/2023 8:43 AM

## 2023-03-28 NOTE — Progress Notes (Signed)
Patient extubated this am. Patient refused bi-pap after extubation. Patient agreed to high flow nasal cannula. SLP and PT worked with patient today. Patient is tolerating his diet and sitting up in recliner. Foley removed, patient unable to void. Per Dr. Sula Soda in and out cath patient. Removed patient triple lumen. Continue to assess.

## 2023-03-28 NOTE — Progress Notes (Signed)
ANTICOAGULATION CONSULT NOTE  Pharmacy Consult for Heparin Infusion Indication: atrial fibrillation  Patient Measurements: Weight: (!) 164 kg (361 lb 8.9 oz) Heparin Dosing Weight: 101.6 kg  Labs: Recent Labs    03/26/23 0422 03/26/23 0534 03/26/23 1726 03/27/23 0358 03/27/23 0931 03/27/23 1707 03/28/23 0340  HGB 16.8  --   --  17.9*  --   --   --   HCT 53.9*  --   --  58.4*  --   --   --   PLT 157  --   --  145*  --   --   --   APTT 92*  --   --   --  88*  --   --   LABPROT  --   --   --   --  13.9  --   --   INR  --   --   --   --  1.1  --   --   HEPARINUNFRC 0.42  --   --  0.44  --   --  0.35  CREATININE  --    < > 1.76* 1.59*  --  1.46*  --    < > = values in this interval not displayed.    Estimated Creatinine Clearance: 89.9 mL/min (A) (by C-G formula based on SCr of 1.46 mg/dL (H)).   Medical History: Past Medical History:  Diagnosis Date   Atrial flutter (HCC)    a. CHA2DS2VASc = 4-->xarelto.   CAD (coronary artery disease)    a. 04/2020 Cath: LM nl, LAD 30ost/p, LCX nl, RCA 30p/m.   Chronic combined systolic and diastolic CHF (congestive heart failure) (HCC)    a. 03/2020 Echo: EF 40-45%; b. 05/2021 Echo: EF 40-45%; c. 10/2022 Echo: EF 55-60%, no rwma, sev LVH, RVSP 23.1 mmHg.   Diabetes mellitus without complication (HCC)    Hyperlipidemia    Hypertension    NICM (nonischemic cardiomyopathy) (HCC)    Nonadherence to medication    Obesity    Sleep apnea, obstructive     Medications:  PTA Meds: Rivaroxaban 20 mg qpm, last dose 03/20/23 @ 1637  Assessment: Pt is a 48 yo male with h/o A. Fib, on Xarelto, admitted for CHF exacerbation, now found with "elevated troponins likely demand ischemia," being transitioned to heparin infusion. Cardiology consulted.  Goal of Therapy:  Heparin level 0.3-0.7 units/ml aPTT 66-102 seconds Monitor platelets by anticoagulation protocol: Yes  0204 2123 aPTT 52, subtherapeutic 0205 0419 aPTT 80, therapeutic x 1 / HL >1.1,  not correlating 0205 1227 aPTT 78, therapeutic x 2 0206 0516 aPTT 79, therapeutic x 3 / HL 0.66, correlating x 1 0207 0421 aPTT 74, therapeutic x 4 / HL 0.49, correlating x 2 0208 0426 HL 0.34, therapeutic x 5 0209 0422 HL 0.42, therapeutic x 6 0210 0358 HL 0.44, therapeutic x 7 0211 0340 HL 0.35, therapeutic x 8   Plan:  --Continue heparin infusion at 1800 units/hr --HL, CBC tomorrow AM  Bettey Costa, PharmD Clinical Pharmacist 03/28/2023 5:29 AM

## 2023-03-28 NOTE — Progress Notes (Signed)
Nutrition Follow Up Note   DOCUMENTATION CODES:   Morbid obesity  INTERVENTION:   Ensure Enlive po TID, each supplement provides 350 kcal and 20 grams of protein.  MVI po daily   Daily weights   NUTRITION DIAGNOSIS:   Inadequate oral intake related to inability to eat (pt sedated and ventilated) as evidenced by NPO status. -ongoing   GOAL:   Patient will meet greater than or equal to 90% of their needs -previously met with tube feeds   MONITOR:   PO intake, Supplement acceptance, Labs, Weight trends, I & O's, Skin  ASSESSMENT:   48 y.o. male with medical history significant of DM, hyperlipidemia, sleep apnea (CPAP broken), HTN, paroxysmal atrial flutter, morbid obesity, polycythemia, CAD, CKD III, marijuana use, possible obesity hypoventilation syndrome, previous tobacco use and chronic HFpEF who is admitted with acute respiratory failure with hypoxia, acute on chronic HFpEF, NSTEMI and AKI.  Pt extubated this morning. Pt seen by SLP and was initiated on a diet. Pt is agreeable to drinking Ensure. RD will add supplements and MVI to help pt meet his estimated needs. Per chart, pt is down ~38lbs since admission. Pt -26.2L on his I & Os.   Medications reviewed and include: colace, insulin, miralax, senokot, aldactone, torsemide, heparin  Labs reviewed: K 4.1 wnl, BUN 41(H), creat 1.53(H) Cbgs- 138, 160 x 24 hrs   UOP-   Diet Order:   Diet Order     None      EDUCATION NEEDS:   Not appropriate for education at this time  Skin:  Skin Assessment: Reviewed RN Assessment  Last BM:  2/10- small BM  Height:   Ht Readings from Last 1 Encounters:  03/13/23 5\' 6"  (1.676 m)    Weight:   Wt Readings from Last 1 Encounters:  03/28/23 (!) 164 kg    Ideal Body Weight:  64.5 kg  BMI:  Body mass index is 58.36 kg/m.  Estimated Nutritional Needs:   Kcal:  2700-3000kcal/day  Protein:  >135g/day  Fluid:  1.7-1.9L/day  Betsey Holiday MS, RD, LDN If  unable to be reached, please send secure chat to "RD inpatient" available from 8:00a-4:00p daily

## 2023-03-28 NOTE — Evaluation (Signed)
Clinical/Bedside Swallow Evaluation Patient Details  Name: Gregory Mckenzie MRN: [id number] Date of Birth: Aug 03, 1975  Today's Date: 03/28/2023 Time: SLP Start Time (ACUTE ONLY): 1405 SLP Stop Time (ACUTE ONLY): 1505 SLP Time Calculation (min) (ACUTE ONLY): 60 min  Past Medical History:  Past Medical History:  Diagnosis Date   Atrial flutter (HCC)    a. CHA2DS2VASc = 4-->xarelto.   CAD (coronary artery disease)    a. 04/2020 Cath: LM nl, LAD 30ost/p, LCX nl, RCA 30p/m.   Chronic combined systolic and diastolic CHF (congestive heart failure) (HCC)    a. 03/2020 Echo: EF 40-45%; b. 05/2021 Echo: EF 40-45%; c. 10/2022 Echo: EF 55-60%, no rwma, sev LVH, RVSP 23.1 mmHg.   Diabetes mellitus without complication (HCC)    Hyperlipidemia    Hypertension    NICM (nonischemic cardiomyopathy) (HCC)    Nonadherence to medication    Obesity    Sleep apnea, obstructive    Past Surgical History:  Past Surgical History:  Procedure Laterality Date   RIGHT/LEFT HEART CATH AND CORONARY ANGIOGRAPHY N/A 05/08/2020   Procedure: RIGHT/LEFT HEART CATH AND CORONARY ANGIOGRAPHY;  Surgeon: Gregory Morale, MD;  Location: [facility] INVASIVE CV LAB;  Service: Cardiovascular;  Laterality: N/A;   HPI:  Pt is a 48 y/o M admitted on 03/20/23 after presenting from the heart failure clinic with c/c of acute respiratory distress. Pt required intubation & mechanical ventilation, extubated 03/28/23. Pt is being treated for acute on chronic hypoxic hypercapneic respiratory failure 2/2 CHF exacerbation in the setting of HFmrEF. PMH: Obesity, chronic combined systolic & diastolic CHF, a-fib/flutter, DM2, HLD, HTN, OSA not on CPAP,  medication non-compliance and marijuna use per chart notes, hypoventilation syndrome, former smoker, polycythemia.   CXR at admit: Low lung volumes with vascular congestion and bibasilar  collapse/consolidation. Cardiomegaly, vascular congestion.    Assessment / Plan / Recommendation  Clinical Impression    Pt seen for BSE this PM post ~6 hours given for nonvolitional swallowing and oral care for stimulation of swallowing s/p oral extubation -- pt was orally intubated ~8 days.  Pt awake, verbal and followed all instructions. Speech intelligible but low volume w/ 2-3 word>short phrase length noted d/t Pulmonary decline Baseline. Pt was also recently extubated this AM at ~8am per NSG. He refused bi-pap after extubation but agreed to high flow nasal cannula at that time. Pt sitting in chair; able to help self-feed but noted weak Bilat UEs, suspect impact of lengthy illness.  Pn on HFNC w/ increased O2 support; afebrile. WBC WNL.  Pt appears to present w/ grossly functional pharyngeal phase swallowing; Mild oral phase dysphagia appreciated c/b lengthy mastication effort/Time -- suspect d/t impact of overall weakness from lengthy intubation and Acute illness, Chronic Pulmonary dxs. No pharyngeal phase dysphagia noted. Pt consumed po trials w/ No overt, clinical s/s of aspiration during po trials.  Pt appears at reduced risk for aspiration following general aspiration precautions w/ a modified (foods) diet of Dysphagia level 2 and given feeding support at meals. However, pt does have challenging factors that could impact oropharyngeal swallowing to include lengthy oral intubation, deconditioning/weakness, declined Pulmonary status at baseline, and weak UEs w/ need for support w/ feeding at meals currently. These factors can increase risk for dysphagia as well as decreased oral intake overall.   During po trials, pt consumed all consistencies w/ no overt coughing, decline in vocal quality, or change in respiratory presentation during/post trials. O2 sats remained 92-94% during. Oral phase appeared grossly [facility] w/ timely  bolus management and control of bolus propulsion for A-P transfer for swallowing. Mastication Time and effort increased as bolus texture (to solid foods) increased. Oral clearing achieved w/ all trial  consistencies -- Time, and moistened, softened foods given.  OM Exam appeared [facility] w/ no unilateral lingual weakness noted; min slow OM movement/effort overall -- deconditioning(?) w/ intubation. Speech intelligible though muttered at times(baseline). Pt required full support w/ feeding self but could help to hold Cup when drinking.   Recommend a Dysphagia level 2(MINCED) diet consistency w/ well-moistened foods; Thin liquids -- carefully monitor straw use, and pt should help to Hold Cup when drinking. Recommend general aspiration precautions, reduce distractions/talking during oral intake. Clear mouth fully b/t bites. Eat/drink slowly w/ Rest Breaks b/t bites/sips as needed to maintain calm breathing. Full feeding support currently. Pills CRUSHED vs WHOLE in Puree for safer, easier swallowing -- pt has done this in the past, and it was encourged now and for D/C to the pt.  Education given on Pills in Puree; food consistencies and easy to eat options; general aspiration precautions to pt and Family present. ST services will monitor and provide trials to upgrade diet consistency (foods) when appropriate. NSG updated, agreed. MD updated. Recommend Dietician f/u for support. Precautions placed in chart, room.  SLP Visit Diagnosis: Dysphagia, oral phase (R13.11) (in setting of lengthy illness)    Aspiration Risk  Mild aspiration risk;Risk for inadequate nutrition/hydration (reduced following general aspiration precautions)    Diet Recommendation   Thin;Dysphagia 2 (chopped) =  a Dysphagia level 2(MINCED) diet consistency w/ well-moistened foods; Thin liquids -- carefully monitor straw use, and pt should help to Hold Cup when drinking. Recommend general aspiration precautions, reduce distractions/talking during oral intake. Clear mouth fully b/t bites. Eat/drink slowly w/ Rest Breaks b/t bites/sips as needed to maintain calm breathing. Full feeding support currently.  Medication Administration: Whole meds with  puree (vs CRUSHED in Puree as needed)    Other  Recommendations Recommended Consults:  (Dietician) Oral Care Recommendations: Oral care BID;Oral care before and after PO;Staff/trained caregiver to provide oral care    Recommendations for follow up therapy are one component of a multi-disciplinary discharge planning process, led by the attending physician.  Recommendations may be updated based on patient status, additional functional criteria and insurance authorization.  Follow up Recommendations Follow physician's recommendations for discharge plan and follow up therapies (if indicated at d/c)      Assistance Recommended at Discharge  FULL at meals  Functional Status Assessment Patient has had a recent decline in their functional status and demonstrates the ability to make significant improvements in function in a reasonable and predictable amount of time.  Frequency and Duration min 2x/week  2 weeks       Prognosis Prognosis for improved oropharyngeal function: Fair (-Good) Barriers to Reach Goals: Time post onset;Severity of deficits Barriers/Prognosis Comment: chronic pulmonary illness      Swallow Study   General Date of Onset: 03/20/23 HPI: Pt is a 48 y/o M admitted on 03/20/23 after presenting from the heart failure clinic with c/c of acute respiratory distress. Pt required intubation & mechanical ventilation, extubated 03/28/23. Pt is being treated for acute on chronic hypoxic hypercapneic respiratory failure 2/2 CHF exacerbation in the setting of HFmrEF. PMH: Obesity, chronic combined systolic & diastolic CHF, a-fib/flutter, DM2, HLD, HTN, OSA not on CPAP,  medication non-compliance and marijuna use per chart notes, hypoventilation syndrome, former smoker, polycythemia.   CXR at admit: Low lung volumes with vascular congestion  and bibasilar  collapse/consolidation. Cardiomegaly, vascular congestion. Type of Study: Bedside Swallow Evaluation Previous Swallow Assessment: 05/2021 Diet  Prior to this Study: NPO (regular at home) Temperature Spikes Noted: No (wbc 9.4) Respiratory Status: Nasal cannula (HFNC 40L at 60% FiO2) History of Recent Intubation: Yes Total duration of intubation (days): 8 days Date extubated: 03/28/23 Behavior/Cognition: Alert;Cooperative;Pleasant mood;Distractible;Requires cueing (min) Oral Cavity Assessment: Within Functional Limits Oral Care Completed by SLP: Yes (ongoing) Oral Cavity - Dentition: Missing dentition Vision: Functional for self-feeding Self-Feeding Abilities: Able to feed self;Needs assist;Needs set up;Total assist (weak UEs bilat) Patient Positioning: Upright in chair (head supported foward w/ towel roll) Baseline Vocal Quality: Low vocal intensity (impacted by pulmonary decline) Volitional Cough: Strong;Congested Volitional Swallow: Able to elicit    Oral/Motor/Sensory Function Overall Oral Motor/Sensory Function: Within functional limits   Ice Chips Ice chips: Within functional limits Presentation: Spoon (fed; 5 trials)   Thin Liquid Thin Liquid: Within functional limits Presentation: Self Fed;Straw (SMALL single sips: 15+ trials) Other Comments: pt monitored well    Nectar Thick Nectar Thick Liquid: Not tested   Honey Thick Honey Thick Liquid: Not tested   Puree Puree: Within functional limits Presentation: Spoon;Self Fed (fully supported; 10+ trials)   Solid     Solid: Impaired Presentation: Spoon;Self Fed (fully supported; 8 trials) Oral Phase Impairments: Impaired mastication (reduced effort) Oral Phase Functional Implications: Prolonged oral transit;Impaired mastication Pharyngeal Phase Impairments:  (none)        Jerilynn Som, MS, CCC-SLP Speech Language Pathologist Rehab Services; [facility] [place] [phone] (ascom) Mikel Pyon 03/28/2023,6:13 PM

## 2023-03-29 ENCOUNTER — Inpatient Hospital Stay: Payer: Medicaid Other

## 2023-03-29 DIAGNOSIS — I5043 Acute on chronic combined systolic (congestive) and diastolic (congestive) heart failure: Secondary | ICD-10-CM | POA: Diagnosis not present

## 2023-03-29 LAB — CBC
HCT: 63.5 % — ABNORMAL HIGH (ref 39.0–52.0)
Hemoglobin: 19.4 g/dL — ABNORMAL HIGH (ref 13.0–17.0)
MCH: 29 pg (ref 26.0–34.0)
MCHC: 30.6 g/dL (ref 30.0–36.0)
MCV: 94.8 fL (ref 80.0–100.0)
Platelets: 143 10*3/uL — ABNORMAL LOW (ref 150–400)
RBC: 6.7 MIL/uL — ABNORMAL HIGH (ref 4.22–5.81)
RDW: 20.2 % — ABNORMAL HIGH (ref 11.5–15.5)
WBC: 6.5 10*3/uL (ref 4.0–10.5)
nRBC: 0 % (ref 0.0–0.2)

## 2023-03-29 LAB — GLUCOSE, CAPILLARY
Glucose-Capillary: 174 mg/dL — ABNORMAL HIGH (ref 70–99)
Glucose-Capillary: 143 mg/dL — ABNORMAL HIGH (ref 70–99)
Glucose-Capillary: 168 mg/dL — ABNORMAL HIGH (ref 70–99)
Glucose-Capillary: 228 mg/dL — ABNORMAL HIGH (ref 70–99)
Glucose-Capillary: 160 mg/dL — ABNORMAL HIGH (ref 70–99)
Glucose-Capillary: 136 mg/dL — ABNORMAL HIGH (ref 70–99)

## 2023-03-29 LAB — CULTURE, RESPIRATORY W GRAM STAIN

## 2023-03-29 LAB — BASIC METABOLIC PANEL
Anion gap: 11 (ref 5–15)
BUN: 45 mg/dL — ABNORMAL HIGH (ref 6–20)
CO2: 28 mmol/L (ref 22–32)
Calcium: 9.8 mg/dL (ref 8.9–10.3)
Chloride: 103 mmol/L (ref 98–111)
Creatinine, Ser: 1.43 mg/dL — ABNORMAL HIGH (ref 0.61–1.24)
GFR, Estimated: >60 mL/min (ref >60)
Glucose, Bld: 138 mg/dL — ABNORMAL HIGH (ref 70–99)
Potassium: 4.3 mmol/L (ref 3.5–5.1)
Sodium: 142 mmol/L (ref 135–145)

## 2023-03-29 LAB — HEPARIN LEVEL (UNFRACTIONATED): Heparin Unfractionated: 0.59 [IU]/mL (ref 0.30–0.70)

## 2023-03-29 MED ORDER — ORAL CARE MOUTH RINSE: 15.0000 mL | OROMUCOSAL | Status: DC | PRN

## 2023-03-29 MED ORDER — SACUBITRIL-VALSARTAN 24-26 MG PO TABS
1.0000 | ORAL_TABLET | Freq: Two times a day (BID) | ORAL | Status: DC
  Administered 2023-03-29 – 2023-04-03 (×9): 1 via ORAL
  Filled 2023-03-29 (×10): qty 1

## 2023-03-29 MED ORDER — CARVEDILOL 6.25 MG PO TABS
6.2500 mg | ORAL_TABLET | Freq: Two times a day (BID) | ORAL | Status: DC
  Administered 2023-03-29 – 2023-03-31 (×4): 6.25 mg via ORAL
  Filled 2023-03-29 (×6): qty 1

## 2023-03-29 MED ORDER — RIVAROXABAN 20 MG PO TABS
20.0000 mg | ORAL_TABLET | Freq: Every day | ORAL | Status: DC
Start: 2023-03-29 — End: 2023-04-03
  Administered 2023-03-29 – 2023-04-03 (×6): 20 mg via ORAL
  Filled 2023-03-29 (×6): qty 1

## 2023-03-29 MED ORDER — TORSEMIDE 20 MG PO TABS
60.0000 mg | ORAL_TABLET | Freq: Every day | ORAL | Status: DC
  Administered 2023-03-29 – 2023-04-01 (×4): 60 mg via ORAL
  Filled 2023-03-29 (×4): qty 3

## 2023-03-29 NOTE — Progress Notes (Signed)
NAME:  Gregory Mckenzie, MRN:  [id number], DOB:  November 11, 1975, LOS: 9 ADMISSION DATE:  03/20/2023 History of Present Illness:  48 y.o morbidly obese male with significant PMH of chronic combined systolic and diastolic CHF, atrial fibrillation/flutter, T2DM, HLD, HTN, OSA not on CPAP, obesity hypoventilation syndrome, former smoker, and polycythemia who presented to the ED from the heart failure clinic with chief complaints of acute respiratory distress.   Per ED reports patient was seen in heart failure clinic for evaluation of shortness of breath with very little exertion, fatigue, worsening abdominal distention, cough, worsening pedal edema and weight gain.  He ran out of his oxygen last night around 9 PM.  When he was noted with sats in the lower 60s on room air she was placed on 3L which was increased to 5 L with only improvement up to 83%.  Patient was sent to the ED for further evaluation   ED Course: Initial vital signs showed HR of 86 beats/minute, BP 153/130 mm Hg, the RR 20 breaths/minute, and the oxygen saturation 84% on 5L and a temperature of 98.59F (37.1C).  Pertinent Labs/Diagnostics Findings: Na+/ K+: 140/4.8.  Glucose: 147.  BUN/Cr.:  44/2.19  WBC: 4.2 K/L COVID PCR: Negative,  troponin: 122~152~121  BNP:1596.6   VBG: pO2 47; pCO2 102; pH 7.19;  HCO3 39.0, %O2 Sat 58.6.  CXR> Cardiomegaly, vascular congestion  Medication administered in the ED: Disposition: Patient was given IV Lasix, placed on BiPAP and admitted to hospitalist service.  He however continued declined to wear his BiPAP.  He was given sedation with no improvement.  While still holding in the ED he was noted to be more lethargic and and less responsive therefore he was intubated for airway protection.  PCCM consulted  Pertinent  Medical History  chronic combined systolic and diastolic CHF, atrial fibrillation/flutter, T2DM, HLD, HTN, OSA not on CPAP, obesity hypoventilation syndrome, former smoker, and  polycythemia   Significant Hospital Events: Including procedures, antibiotic start and stop dates in addition to other pertinent events   2/3: Admitted to hospitalist service with acute on chronic hypoxic hypercapnic respiratory failure in the setting of CHF exacerbation. Failed BiPAP requiring intubation.  PCCM consulted 2/4: remains intubated, on diuresis, PICC line placed 2/5: aggressively diuresed, improved oxygen requirements 2/6: remains intubated, Co-ox 67% this AM. Kidney function improved 2/7: intubated, follows commands, I/O -22L, kidney function improved 2/8: creatinine bumped, follows commands off sedation, I/O -23L 2/9 remains on vent, I/O -26L.  02/11 - Extubated to HHFNC. Continues to diurese well.   Interim History / Subjective:  -- This AM patient appears well, sitting in chair and has no major complaints.  Remains on heated high flow at 60%, weaned him down to 50% at bedside.  --  -1.1L in 24hrs.  -- Afebrile today.  Objective   Blood pressure (!) 142/99, pulse (!) 103, temperature 98 F (36.7 C), temperature source Oral, resp. rate (!) 35, weight (!) 163.7 kg, SpO2 94%. CVP:  [9 mmHg] 9 mmHg  FiO2 (%):  [60 %-80 %] 60 %   Intake/Output Summary (Last 24 hours) at 03/29/2023 0750 Last data filed at 03/29/2023 0459 Gross per 24 hour  Intake 884.37 ml  Output 2000 ml  Net -1115.63 ml   Filed Weights   03/27/23 0500 03/28/23 0408 03/29/23 0500  Weight: (!) 168.5 kg (!) 164 kg (!) 163.7 kg    Examination: General: Awake and following commands.   HENT: Supple neck, reactive pupils Lungs: Coarse breath sounds bilaterally  Cardiovascular: Normal S1, Normal S2, irregularly irregular rhythm  Abdomen: Soft, non tender, non distended Extremities: trace lower extremities edema.   Labs and imaging were reviewed. Assessment & Plan:  Case of a 48 year old male patient with a past medical history of morbid obesity, chronic combined systolic and diastolic congestive heart  failure EF 45 to 50% in 2025, moderately reduced RV systolic function with mildly elevated pulmonary artery systolic pressures, OSA not on CPAP, hypertension, hyperlipidemia and type 2 diabetes mellitus, paroxysmal A-fib on Xarelto who presented to [facility] on 03/20/2023 with acute on chronic hypoxic hypercapnic respiratory failure requiring intubation and mechanical ventilation on 03/20/2023.  Thought to be secondary to decompensated heart failure with significant fluid overload.  Aggressively diurese with current overall state -24 L.  #Acute on chronic hypoxic hypercapneic respiratory failure intubated 02/03 and extubated 02/11 secondary to...  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  #Paroxysmal A.fib on eliquis  #Persistent fevers with differential including new infectious process vs drug fever (Precedex).  CXR 02/10 unremarkable. Blood cultures negative so far. WBC uptrending. DC foley cath.   Neuro: No issues CVS: Continue with PO Torsemide 60 daily. C/w Spirinolactone 12.5 mg daily.  Increase carvedilol to 6.25 mg twice daily. GDMT per cardiology. Heparin drip.  Lungs: . CPAP at bedtime.  Incentive spirometer.  Wean down hide to the high flow nasal cannula for SpO2 goal greater than 90%. GI: Lax PRN, Last bowel movement?. Protonix for prophylaxis.  Renal: Diuresis as above. Replete Electrolytes as needed.  Heme: On heparin drip.   PT OT -current recommendations acute rehab.  Pending transfer to hospitalist service.  Best Practice (right click and "Reselect all SmartList Selections" daily)   Diet/type: tubefeeds DVT prophylaxis systemic heparin Pressure ulcer(s): N/A GI prophylaxis: PPI Lines: Central line Foley:  Yes, and it is still needed Code Status:  full code  Last date of multidisciplinary goals of care discussion [03/28/2023]  I spent 40 minutes caring for this  patient today, including preparing to see the patient, obtaining a medical history , reviewing a separately obtained history, performing a medically appropriate examination and/or evaluation, counseling and educating the patient/family/caregiver, ordering medications, tests, or procedures, documenting clinical information in the electronic health record, and independently interpreting results (not separately reported/billed) and communicating results to the patient/family/caregiver  Janann Colonel, MD [facility] 03/29/2023 7:50 AM

## 2023-03-29 NOTE — Progress Notes (Signed)
ANTICOAGULATION CONSULT NOTE  Pharmacy Consult for Heparin Infusion Indication: atrial fibrillation  Patient Measurements: Weight: (!) 163.7 kg (360 lb 14.3 oz) Heparin Dosing Weight: 101.6 kg  Labs: Recent Labs    03/27/23 0358 03/27/23 0931 03/27/23 1707 03/28/23 0340 03/28/23 0504 03/29/23 0521  HGB 17.9*  --   --  19.3*  --  19.4*  HCT 58.4*  --   --  62.7*  --  63.5*  PLT 145*  --   --  153  --  143*  APTT  --  88*  --   --   --   --   LABPROT  --  13.9  --   --   --   --   INR  --  1.1  --   --   --   --   HEPARINUNFRC 0.44  --   --  0.35  --  0.59  CREATININE 1.59*  --  1.46*  --  1.53*  --     Estimated Creatinine Clearance: 85.7 mL/min (A) (by C-G formula based on SCr of 1.53 mg/dL (H)).   Medical History: Past Medical History:  Diagnosis Date   Atrial flutter (HCC)    a. CHA2DS2VASc = 4-->xarelto.   CAD (coronary artery disease)    a. 04/2020 Cath: LM nl, LAD 30ost/p, LCX nl, RCA 30p/m.   Chronic combined systolic and diastolic CHF (congestive heart failure) (HCC)    a. 03/2020 Echo: EF 40-45%; b. 05/2021 Echo: EF 40-45%; c. 10/2022 Echo: EF 55-60%, no rwma, sev LVH, RVSP 23.1 mmHg.   Diabetes mellitus without complication (HCC)    Hyperlipidemia    Hypertension    NICM (nonischemic cardiomyopathy) (HCC)    Nonadherence to medication    Obesity    Sleep apnea, obstructive     Medications:  PTA Meds: Rivaroxaban 20 mg qpm, last dose 03/20/23 @ 1637  Assessment: Pt is a 48 yo male with h/o A. Fib, on Xarelto, admitted for CHF exacerbation, now found with "elevated troponins likely demand ischemia," being transitioned to heparin infusion. Cardiology consulted.  Goal of Therapy:  Heparin level 0.3-0.7 units/ml aPTT 66-102 seconds Monitor platelets by anticoagulation protocol: Yes  0204 2123 aPTT 52, subtherapeutic 0205 0419 aPTT 80, therapeutic x 1 / HL >1.1, not correlating 0205 1227 aPTT 78, therapeutic x 2 0206 0516 aPTT 79, therapeutic x 3 / HL 0.66,  correlating x 1 0207 0421 aPTT 74, therapeutic x 4 / HL 0.49, correlating x 2 0208 0426 HL 0.34, therapeutic x 5 0209 0422 HL 0.42, therapeutic x 6 0210 0358 HL 0.44, therapeutic x 7 0211 0340 HL 0.35, therapeutic x 8 0212 0521 HL 0.59  therapeutic x 9   Plan:  --Continue heparin infusion at 1800 units/hr --HL, CBC tomorrow AM  Bettey Costa, PharmD Clinical Pharmacist 03/29/2023 6:42 AM

## 2023-03-29 NOTE — Progress Notes (Signed)
Patient ID: Gregory Mckenzie, male   DOB: 1975/08/26, 48 y.o.   MRN: [id number]     ADVANCED HF ROUNDINGNOTE   Cardiologist: Yvonne Kendall, MD  Chief Complaint: Heart Failure  Subjective:   Diuresed yesterday with 80 IV lasix x1 as he was not taking POs yet. -2L UOP.  net -27 L since admission.    Now tolerating PO.   Central line removed yesterday. Afebrile today  Sitting up in chair in HHFNC. Feels ok, denies SOB/CP.  Objective:   Weight Range: (!) 163.7 kg Body mass index is 58.25 kg/m.   Vital Signs:   Temp:  [98 F (36.7 C)-99.2 F (37.3 C)] 98 F (36.7 C) (02/12 0400) Pulse Rate:  [51-120] 104 (02/12 0800) Resp:  [15-35] 21 (02/12 0800) BP: (110-155)/(59-105) 145/73 (02/12 0800) SpO2:  [91 %-99 %] 92 % (02/12 0800) FiO2 (%):  [50 %-80 %] 50 % (02/12 0800) Weight:  [163.7 kg] 163.7 kg (02/12 0500) Last BM Date : 03/27/23  Weight change: Filed Weights   03/27/23 0500 03/28/23 0408 03/29/23 0500  Weight: (!) 168.5 kg (!) 164 kg (!) 163.7 kg   Intake/Output:   Intake/Output Summary (Last 24 hours) at 03/29/2023 0820 Last data filed at 03/29/2023 0459 Gross per 24 hour  Intake 823.88 ml  Output 2000 ml  Net -1176.12 ml    Physical Exam  General:  well appearing.  No respiratory difficulty HEENT: +HHFNC Neck: supple. JVD ~7 cm. Carotids 2+ bilat; no bruits. No lymphadenopathy or thyromegaly appreciated. Cor: PMI nondisplaced. Tachy/regular rate & rhythm. No rubs, gallops or murmurs. Lungs: clear, diminished bases Abdomen: soft, nontender, nondistended. No hepatosplenomegaly. No bruits or masses. Good bowel sounds. Extremities: no cyanosis, clubbing, rash, edema  Neuro: alert & oriented x 3, cranial nerves grossly intact. moves all 4 extremities w/o difficulty. Affect pleasant.   Tele    ST low 100s (Personally reviewed)    Labs    CBC Recent Labs    03/28/23 0340 03/29/23 0521  WBC 9.4 6.5  HGB 19.3* 19.4*  HCT 62.7* 63.5*  MCV 94.3 94.8   PLT 153 143*   Basic Metabolic Panel Recent Labs    40/98/11 1707 03/28/23 0504  NA 146* 143  K 4.0 4.1  CL 106 102  CO2 29 28  GLUCOSE 141* 151*  BUN 38* 41*  CREATININE 1.46* 1.53*  CALCIUM 8.5* 9.1  MG 2.5* 2.8*  PHOS  --  2.8   Liver Function Tests No results for input(s): "AST", "ALT", "ALKPHOS", "BILITOT", "PROT", "ALBUMIN" in the last 72 hours.  No results for input(s): "LIPASE", "AMYLASE" in the last 72 hours. Cardiac Enzymes No results for input(s): "CKTOTAL", "CKMB", "CKMBINDEX", "TROPONINI" in the last 72 hours.  BNP: BNP (last 3 results) Recent Labs    03/07/23 1207 03/09/23 0957 03/20/23 1046  BNP 2,259.3* 1,857.9* 1,596.6*    ProBNP (last 3 results) No results for input(s): "PROBNP" in the last 8760 hours.   D-Dimer No results for input(s): "DDIMER" in the last 72 hours. Hemoglobin A1C No results for input(s): "HGBA1C" in the last 72 hours. Fasting Lipid Panel No results for input(s): "CHOL", "HDL", "LDLCALC", "TRIG", "CHOLHDL", "LDLDIRECT" in the last 72 hours.  Thyroid Function Tests No results for input(s): "TSH", "T4TOTAL", "T3FREE", "THYROIDAB" in the last 72 hours.  Invalid input(s): "FREET3"  Other results:   Imaging    No results found.    Medications:     Scheduled Medications:  atorvastatin  80 mg Oral QHS  carvedilol  6.25 mg Oral BID WC   Chlorhexidine Gluconate Cloth  6 each Topical Daily   docusate sodium  100 mg Oral BID   feeding supplement  237 mL Oral TID BM   insulin aspart  2-6 Units Subcutaneous Q4H   ipratropium-albuterol  3 mL Nebulization Q6H   multivitamin with minerals  1 tablet Oral Daily   polyethylene glycol  17 g Oral Daily   sennosides  10 mL Oral QHS   sodium chloride flush  10-40 mL Intracatheter Q12H   spironolactone  12.5 mg Oral Daily    Infusions:  heparin 1,800 Units/hr (03/29/23 0459)    PRN Medications: acetaminophen, albuterol, bisacodyl, ondansetron **OR** ondansetron  (ZOFRAN) IV, mouth rinse, sodium chloride flush    Patient Profile  Gregory Mckenzie is 77 uyear old with history HFpEF, DMII, HLD, OSA, HTN, PAF, chronic hypoxic respiratory failure on 2-4 liters, and polycythemia. 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 Family history of HTN Dad/Brother. Brother also had PE.   Admitted with A/C Hypoxic Respiratory Failure and A/C HFpEF Assessment/Plan  1. Acute on chronic HF with mid-range EF: Significant RV dysfunction and likely significant pulmonary hypertension in the setting of OHS/OSA. Echo this admission showed EF 45-50%, severe LVH, moderate RV enlargement and moderate RV dysfunction, PASP 44 mmHg.  -  Start Torsemide 60 mg daily - SCr stable around his baseline.  - Start Entresto 24-26 mg BID - Continue spironolactone 12.5 mg daily - Consider RHC to assess filling pressures/PA pressure.  - Continue follow daily weights, strict I's and O's and renal function while diuresing  2. Hypotension/shock: Suspect this is due to combination of RV failure and sedation with intubation.   - stable off levo.   3. Acute hypoxemic/hypercarbic respiratory failure: In setting of OHS/OSA (on home oxygen 3-4 L, has not had CPAP for 6 months) and CHF/volume overload.  Required intubation.  - Continue diuresis as above - Will need to get back on CPAP at home. - Stable on HHFNC   4. CKD stage 3a: Creatinine at baseline - SCr baseline (1.4-1.6).   - follow with diuresis - BMET today  5. Polycythemia: Likely due to chronic hypoxemia.   6. Elevated troponin: Mild elevation, no trend.  - Had cath in 2022 with minimal nonobstructive CAD.  - Continue atorvastatin 80 mg daily  7. Atrial fibrillation:  -this is paroxysmal  -Currently maintaining normal sinus rhythm on telemetry -Stop heparin gtt, transition to Xarelto  8. DM2: SSI.   9. ID - blood cultures 2/10 NGTD - TMax 103 yesterday. Afebrile today - central line and foley removed.  - Abx course completed  yesterday   Alen Bleacher AGACNP-BC  03/29/2023 8:20 AM

## 2023-03-29 NOTE — Progress Notes (Signed)
Physical Therapy Treatment Patient Details Name: Gregory Mckenzie MRN: [id number] DOB: May 22, 1975 Today's Date: 03/29/2023   History of Present Illness Pt is a 48 y/o M admitted on 03/20/23 after presenting from the heart failure clinic with c/c of acute respiratory distress. Pt required intubation & mechanical ventilation, extubated 03/28/23. Pt is being treated for acute on chronic hypoxic hypercapneic respiratory failure 2/2 CHF exacerbation in the setting of HFmrEF. PMH: chronic combined systolic & diastolic CHF, a-fib/flutter, DM2, HLD, HTN, OSA not on CPAP, obesity hypoventilation syndrome, former smoker, polycythemia    PT Comments  Patient received in bed, he is agreeable to PT session. Patient required assistance to get bed in position/level appropriate for mobility via controls. Patient is able to perform supine to sit with min A +1-2. Stood with min A and was able to take steps in all directions, march in place with min A +2. Patient should progress well. We will continue to see him for continued strengthening, ambulation and safety.      If plan is discharge home, recommend the following: A little help with walking and/or transfers;A little help with bathing/dressing/bathroom;Assist for transportation;Help with stairs or ramp for entrance;Assistance with cooking/housework;Direct supervision/assist for medications management   Can travel by private vehicle     No  Equipment Recommendations  Rolling walker (2 wheels)    Recommendations for Other Services       Precautions / Restrictions Precautions Precautions: Fall Restrictions Weight Bearing Restrictions Per Provider Order: No     Mobility  Bed Mobility Overal bed mobility: Needs Assistance Bed Mobility: Supine to Sit, Sit to Supine     Supine to sit: Min assist, HOB elevated, Used rails Sit to supine: Used rails, Min assist   General bed mobility comments: Assistance needed for bed controls, safety     Transfers Overall transfer level: Needs assistance Equipment used: Rolling walker (2 wheels) Transfers: Sit to/from Stand Sit to Stand: Min assist, +2 safety/equipment           General transfer comment: Patient able to stand with cga and RW    Ambulation/Gait Ambulation/Gait assistance: Contact guard assist Gait Distance (Feet): 10 Feet Assistive device: Rolling walker (2 wheels)   Gait velocity: decr     General Gait Details: patient able to laterally step at edge of bed and ambulate forward and back with RW and min A.   Stairs             Wheelchair Mobility     Tilt Bed    Modified Rankin (Stroke Patients Only)       Balance Overall balance assessment: Needs assistance Sitting-balance support: Feet supported Sitting balance-Leahy Scale: Fair Sitting balance - Comments: limited by high bed.   Standing balance support: Bilateral upper extremity supported, During functional activity, Reliant on assistive device for balance Standing balance-Leahy Scale: Good Standing balance comment: patient able to stand for extended time, washed face at sink.                            Communication Communication Communication: Impaired Factors Affecting Communication: Reduced clarity of speech  Cognition Arousal: Alert Behavior During Therapy: WFL for tasks assessed/performed   PT - Cognitive impairments: Difficult to assess Difficult to assess due to: Impaired communication                     PT - Cognition Comments: garbled speech Following commands: Intact  Cueing Cueing Techniques: Verbal cues  Exercises      General Comments        Pertinent Vitals/Pain Pain Assessment Pain Assessment: No/denies pain    Home Living                          Prior Function            PT Goals (current goals can now be found in the care plan section) Acute Rehab PT Goals Patient Stated Goal: get better, go home PT Goal  Formulation: With patient Time For Goal Achievement: 04/11/23 Potential to Achieve Goals: Fair Progress towards PT goals: Progressing toward goals    Frequency    Min 1X/week      PT Plan      Co-evaluation   Reason for Co-Treatment: For patient/therapist safety PT goals addressed during session: Mobility/safety with mobility;Balance;Proper use of DME        AM-PAC PT "6 Clicks" Mobility   Outcome Measure  Help needed turning from your back to your side while in a flat bed without using bedrails?: A Little Help needed moving from lying on your back to sitting on the side of a flat bed without using bedrails?: A Little Help needed moving to and from a bed to a chair (including a wheelchair)?: A Little Help needed standing up from a chair using your arms (e.g., wheelchair or bedside chair)?: A Little Help needed to walk in hospital room?: A Lot Help needed climbing 3-5 steps with a railing? : Total 6 Click Score: 15    End of Session Equipment Utilized During Treatment: Oxygen Activity Tolerance: Patient tolerated treatment well Patient left: in bed;with call bell/phone within reach Nurse Communication: Mobility status;Other (comment) (difficulty of moving patient on that bed. Took air out for sitting back down on bed.) PT Visit Diagnosis: Muscle weakness (generalized) (M62.81);Difficulty in walking, not elsewhere classified (R26.2)     Time: 1443-1510 PT Time Calculation (min) (ACUTE ONLY): 27 min  Charges:    $Therapeutic Activity: 23-37 mins PT General Charges $$ ACUTE PT VISIT: 1 Visit                     Jahmir Salo, PT, GCS 03/29/23,3:40 PM

## 2023-03-30 DIAGNOSIS — I2609 Other pulmonary embolism with acute cor pulmonale: Secondary | ICD-10-CM

## 2023-03-30 DIAGNOSIS — I5043 Acute on chronic combined systolic (congestive) and diastolic (congestive) heart failure: Secondary | ICD-10-CM | POA: Diagnosis not present

## 2023-03-30 LAB — BASIC METABOLIC PANEL
Anion gap: 10 (ref 5–15)
BUN: 43 mg/dL — ABNORMAL HIGH (ref 6–20)
CO2: 29 mmol/L (ref 22–32)
Calcium: 9.2 mg/dL (ref 8.9–10.3)
Chloride: 104 mmol/L (ref 98–111)
Creatinine, Ser: 1.42 mg/dL — ABNORMAL HIGH (ref 0.61–1.24)
GFR, Estimated: >60 mL/min (ref >60)
Glucose, Bld: 108 mg/dL — ABNORMAL HIGH (ref 70–99)
Potassium: 4.3 mmol/L (ref 3.5–5.1)
Sodium: 143 mmol/L (ref 135–145)

## 2023-03-30 LAB — GLUCOSE, CAPILLARY
Glucose-Capillary: 124 mg/dL — ABNORMAL HIGH (ref 70–99)
Glucose-Capillary: 129 mg/dL — ABNORMAL HIGH (ref 70–99)
Glucose-Capillary: 135 mg/dL — ABNORMAL HIGH (ref 70–99)
Glucose-Capillary: 172 mg/dL — ABNORMAL HIGH (ref 70–99)
Glucose-Capillary: 180 mg/dL — ABNORMAL HIGH (ref 70–99)
Glucose-Capillary: 215 mg/dL — ABNORMAL HIGH (ref 70–99)

## 2023-03-30 LAB — CULTURE, RESPIRATORY W GRAM STAIN: Culture: NORMAL

## 2023-03-30 MED ORDER — ACETAZOLAMIDE 250 MG PO TABS
250.0000 mg | ORAL_TABLET | Freq: Two times a day (BID) | ORAL | Status: AC
  Administered 2023-03-30 (×2): 250 mg via ORAL
  Filled 2023-03-30 (×2): qty 1

## 2023-03-30 MED ORDER — ACETAZOLAMIDE 250 MG PO TABS
250.0000 mg | ORAL_TABLET | Freq: Two times a day (BID) | ORAL | Status: DC
  Filled 2023-03-30: qty 1

## 2023-03-30 MED ORDER — EMPAGLIFLOZIN 10 MG PO TABS
10.0000 mg | ORAL_TABLET | Freq: Every day | ORAL | Status: DC
  Administered 2023-03-30 – 2023-04-03 (×5): 10 mg via ORAL
  Filled 2023-03-30 (×5): qty 1

## 2023-03-30 NOTE — Progress Notes (Signed)
Physical Therapy Treatment Patient Details Name: Gregory Mckenzie MRN: [id number] DOB: 01-07-1976 Today's Date: 03/30/2023   History of Present Illness Pt is a 48 y/o M admitted on 03/20/23 after presenting from the heart failure clinic with c/c of acute respiratory distress. Pt required intubation & mechanical ventilation, extubated 03/28/23. Pt is being treated for acute on chronic hypoxic hypercapneic respiratory failure 2/2 CHF exacerbation in the setting of HFmrEF. PMH: chronic combined systolic & diastolic CHF, a-fib/flutter, DM2, HLD, HTN, OSA not on CPAP, obesity hypoventilation syndrome, former smoker, polycythemia    PT Comments  Pt was sitting in recliner upon arrival. He is alert and cooperative however does have some impulsivity and poor overall awareness of his deficits. He was able to stand and ambulate 3 lap around room with O2 + use of RW. Pt had one occasion of LOB with intervention to prevent falling towards L. Overall pt tolerated session well but is limited by fatigue. He will continue to benefit from skilled PT at DC to maximize independence and safety with all ADLs.    If plan is discharge home, recommend the following: A little help with walking and/or transfers;A little help with bathing/dressing/bathroom;Assist for transportation;Help with stairs or ramp for entrance;Assistance with cooking/housework;Direct supervision/assist for medications management     Equipment Recommendations  Rolling walker (2 wheels)       Precautions / Restrictions Precautions Precautions: Fall Recall of Precautions/Restrictions: Impaired Restrictions Weight Bearing Restrictions Per Provider Order: No     Mobility  Bed Mobility    General bed mobility comments: Pt was in recliner pre/post session    Transfers Overall transfer level: Needs assistance Equipment used: Rolling walker (2 wheels) Transfers: Sit to/from Stand Sit to Stand: Contact guard assist  General transfer comment:  CGA for safety with vcs for handplacement, fwd wt shift, and to slow down for additional safety    Ambulation/Gait Ambulation/Gait assistance: Contact guard assist Gait Distance (Feet): 60 Feet Assistive device: Rolling walker (2 wheels) Gait Pattern/deviations: Step-through pattern, Staggering left Gait velocity: decr  General Gait Details: pt was able to ambulate 3 lap around his room with RW. does have one occasion of LOB with intervention to prevent falling lateral L    Balance Overall balance assessment: Needs assistance Sitting-balance support: Feet supported Sitting balance-Leahy Scale: Fair     Standing balance support: Bilateral upper extremity supported, During functional activity, Reliant on assistive device for balance Standing balance-Leahy Scale: Fair Standing balance comment: pt is at high risk of falls due to balance deficits and impulsivity          Cognition Arousal: Alert Behavior During Therapy: WFL for tasks assessed/performed   PT - Cognitive impairments: No family/caregiver present to determine baseline      PT - Cognition Comments: Pt presents with slight impulsivity but overall seems to be improving since last PT session. speech was clear and able to be understood throughout. Following commands: Intact                Pertinent Vitals/Pain Pain Assessment Pain Assessment: No/denies pain     PT Goals (current goals can now be found in the care plan section) Acute Rehab PT Goals Patient Stated Goal: get better, go home Progress towards PT goals: Progressing toward goals    Frequency    Min 1X/week           Co-evaluation     PT goals addressed during session: Mobility/safety with mobility;Balance;Proper use of DME  AM-PAC PT "6 Clicks" Mobility   Outcome Measure  Help needed turning from your back to your side while in a flat bed without using bedrails?: A Little Help needed moving from lying on your back to sitting on  the side of a flat bed without using bedrails?: A Little Help needed moving to and from a bed to a chair (including a wheelchair)?: A Little Help needed standing up from a chair using your arms (e.g., wheelchair or bedside chair)?: A Little Help needed to walk in hospital room?: A Little Help needed climbing 3-5 steps with a railing? : A Lot 6 Click Score: 17    End of Session Equipment Utilized During Treatment: Oxygen Activity Tolerance: Patient tolerated treatment well;Patient limited by fatigue Patient left: in bed;with call bell/phone within reach Nurse Communication: Mobility status;Other (comment) PT Visit Diagnosis: Muscle weakness (generalized) (M62.81);Difficulty in walking, not elsewhere classified (R26.2)     Time: 1401-1420 PT Time Calculation (min) (ACUTE ONLY): 19 min  Charges:    $Gait Training: 8-22 mins PT General Charges $$ ACUTE PT VISIT: 1 Visit                    Jetta Lout PTA 03/30/23, 4:50 PM

## 2023-03-30 NOTE — Progress Notes (Signed)
Speech Language Pathology Treatment: Dysphagia  Patient Details Name: Gregory Mckenzie MRN: [id number] DOB: Mar 21, 1975 Today's Date: 03/30/2023 Time: 1205-1300 SLP Time Calculation (min) (ACUTE ONLY): 55 min  Assessment / Plan / Recommendation Clinical Impression  Pt seen for f/u w/ toleration of diet and trials to upgrade if able. Pt alert, talkative and sitting in chair in room.   Pt verbally conversed w/ this SLP; followed all instructions w/ gentile cue. At times he was distracted by easy to redirect attention to task w/ cue. Speech intelligible but low volume w/ short phrase length noted d/t Pulmonary decline Baseline; muttered/mumbled speech also impacting intelligibility(Baseline). Pt is missing MANY Dentition which can impact articulation precision of speech.  Pt was intubated/extubated ~8 days per chart. Pt sitting in chair; able to help self-feed which appeared much imprved from yesterday.   Pn on [place] O2 6L; afebrile. WBC WNL.  Pt appears to present w/ grossly functional oropharyngeal phase swallowing today; min increased oral phase time for mastication of increased textured foods but WFL and at his Baseline. Oral phase awareness and bolus management appeared more appropriate and timely today -- another day post acute illness. Suspect impact of overall weakness from lengthy intubation and Acute illness, Chronic Pulmonary dxs.  Pt consumed po trials w/ No overt, clinical s/s of aspiration during po trials.  Pt appears at reduced risk for aspiration following general aspiration precautions w/ a modified (foods) diet of Dysphagia level 2 and given feeding support at meals. However, pt does have challenging factors that could impact oropharyngeal swallowing to include lengthy oral intubation, deconditioning/weakness, declined Pulmonary status at baseline, and overall generalized weakness including UEs which can impact self-feeding at meals. These factors can increase risk for dysphagia as  well as decreased oral intake overall.    During po trials of Mech Soft foods and thin liquids, pt consumed consistencies w/ no overt coughing, decline in vocal quality, or change in respiratory presentation during/post trials. O2 sats remained 95% during. Oral phase appeared grossly [facility] w/ timely bolus management, mastication, and control of bolus propulsion for A-P transfer for swallowing. Mastication Time and effort appeared min increased w/ soft solid food trials but pt was able to adequately manage the bolus w/ mastication and transfer for swallowing appropriately. Oral clearing achieved w/ all trial consistencies -- Time, and moistened, softened foods given.     Recommend a Dysphagia level 3(Mech Soft) diet consistency w/ well-moistened foods; Thin liquids -- carefully monitor straw use, and pt should Hold Cup when drinking. Recommend general aspiration precautions, reduce distractions/talking/TV during oral intake and meals. Clear mouth fully b/t bites. Eat/drink slowly w/ Rest Breaks b/t bites/sips as needed to maintain calm breathing. Chew foods well. Tray setup and support at meals as needed. Pills WHOLE vs Crushed in Puree for safer, easier swallowing -- pt has done this in the past, and it was encourged now and for D/C to the pt.   Education given on Pills in Puree; food consistencies and easy to eat options; general aspiration precautions to pt. He exhibited much improved presentation w/ oral intake today. ST services will monitor pt's status at another meal for any further needs. NSG updated, agreed. MD updated. Recommend Dietician f/u for support. Precautions placed in chart, room.     HPI HPI: Pt is a 48 y/o M admitted on 03/20/23 after presenting from the heart failure clinic with c/c of acute respiratory distress. Pt required intubation & mechanical ventilation, extubated 03/28/23. Pt is being treated for acute on  chronic hypoxic hypercapneic respiratory failure 2/2 CHF exacerbation in the  setting of HFmrEF. PMH: Obesity, chronic combined systolic & diastolic CHF, a-fib/flutter, DM2, HLD, HTN, OSA not on CPAP,  medication non-compliance and marijuna use per chart notes, hypoventilation syndrome, former smoker, polycythemia.   CXR at admit: Low lung volumes with vascular congestion and bibasilar  collapse/consolidation. Cardiomegaly, vascular congestion.      SLP Plan  Continue with current plan of care (po check then s/o)      Recommendations for follow up therapy are one component of a multi-disciplinary discharge planning process, led by the attending physician.  Recommendations may be updated based on patient status, additional functional criteria and insurance authorization.    Recommendations  Diet recommendations: Dysphagia 3 (mechanical soft);Thin liquid (rec'd diet consistency in setting of missing dentition) Liquids provided via: Cup;Straw Medication Administration: Whole meds with puree (vs need to Crush in puree) Supervision: Patient able to self feed;Intermittent supervision to cue for compensatory strategies Compensations: Minimize environmental distractions;Slow rate;Small sips/bites;Lingual sweep for clearance of pocketing;Follow solids with liquid Postural Changes and/or Swallow Maneuvers: Out of bed for meals;Seated upright 90 degrees;Upright 30-60 min after meal                 (Dietician f/u) Oral care BID;Oral care before and after PO;Staff/trained caregiver to provide oral care   Intermittent Supervision/Assistance Dysphagia, unspecified (R13.10) (missing dentition baseline)     Continue with current plan of care (po check then s/o)        Jerilynn Som, MS, CCC-SLP Speech Language Pathologist Rehab Services; [facility] [phone] (ascom) Gregory Mckenzie  03/30/2023, 4:11 PM

## 2023-03-30 NOTE — Plan of Care (Signed)
  Problem: Coping: Goal: Ability to adjust to condition or change in health will improve Outcome: Progressing   Problem: Skin Integrity: Goal: Risk for impaired skin integrity will decrease Outcome: Progressing   Problem: Activity: Goal: Risk for activity intolerance will decrease Outcome: Progressing   Problem: Nutrition: Goal: Adequate nutrition will be maintained Outcome: Progressing   Problem: Pain Managment: Goal: General experience of comfort will improve and/or be controlled Outcome: Progressing

## 2023-03-30 NOTE — Progress Notes (Signed)
PT Cancellation Note  Patient Details Name: Gregory Mckenzie MRN: [id number] DOB: November 11, 1975   Cancelled Treatment:     PT attempt. Upon arriving to room, pt was seated in standard chair working on breakfast. Pt actively starts vomiting. Emesis bag provided and RN staff made aware. Author will return later this date and continue to follow per current POC.    Rushie Chestnut 03/30/2023, 11:23 AM

## 2023-03-30 NOTE — Progress Notes (Addendum)
Progress Note    Gregory Mckenzie  ZOX:[id number] DOB: 12/20/1975  DOA: 03/20/2023 PCP: Pcp, No      Brief Narrative:    Medical records reviewed and are as summarized below:  Gregory Mckenzie is a 48 y.o. male significant PMH of chronic combined systolic and diastolic CHF, atrial fibrillation/flutter, T2DM, HLD, HTN, OSA not on CPAP, obesity hypoventilation syndrome, former smoker, morbid obesity, polycythemia, who was referred from the heart failure clinic because of acute respiratory distress.  Reportedly, he was seen in the heart failure clinic for exertional shortness of breath, fatigue, worsening abdominal distention, increasing pedal edema weight gain and cough.  He had run out of his oxygen the night prior to admission.  He was noted to have oxygen saturation 67% on room air at the CHF clinic.  He was subsequently referred to the ED for further management.    Significant Hospital Events: Including procedures, antibiotic start and stop dates in addition to other pertinent events   2/3: Admitted to hospitalist service with acute on chronic hypoxic hypercapnic respiratory failure in the setting of CHF exacerbation. Failed BiPAP requiring intubation.  PCCM consulted 2/4: remains intubated, on diuresis, PICC line placed 2/5: aggressively diuresed, improved oxygen requirements 2/6: remains intubated, Co-ox 67% this AM. Kidney function improved 2/7: intubated, follows commands, I/O -22L, kidney function improved 2/8: creatinine bumped, follows commands off sedation, I/O -23L 2/9 remains on vent, I/O -26L.  02/11 - Extubated to HHFNC. Continues to diurese well.    Patient was transferred to the hospitalist service 03/30/2023. Chart reviewed.   Assessment/Plan:   Principal Problem:   CHF exacerbation (HCC) Active Problems:   Acute respiratory failure with hypoxia and hypercapnia (HCC)   NSTEMI (non-ST elevated myocardial infarction) (HCC)   Acute kidney injury  superimposed on chronic kidney disease (HCC)   Type 2 diabetes mellitus with complication, without long-term current use of insulin (HCC)   Class 3 severe obesity due to excess calories with serious comorbidity and body mass index (BMI) of 45.0 to 49.9 in adult [facility])   Paroxysmal atrial flutter (HCC)   Acute on chronic heart failure with mildly reduced ejection fraction (HFmrEF, 41-49%) (HCC)   CKD stage 3a, GFR 45-59 ml/min (HCC)   RVF (right ventricular failure) (HCC)   Cardiogenic shock (HCC)   PAF (paroxysmal atrial fibrillation) (HCC)   Acute hypoxic respiratory failure (HCC)   Nutrition Problem: Inadequate oral intake Etiology: inability to eat (pt sedated and ventilated)  Signs/Symptoms: NPO status   Body mass index is 56.04 kg/m.  (Morbid obesity)   Acute on chronic hypoxic and hypercapnic respiratory failure: Improved.  He is on 6 L/min oxygen via nasal cannula.  He said he is 4 L at home. S/p intubation on 03/20/2023 and extubated on 03/28/2023.   Acute exacerbation of chronic heart failure with midrange ejection fraction, significant right ventricular dysfunction and probable significant pulmonary hypertension: Continue torsemide, Entresto, Coreg and carvedilol.  Diamox and Jardiance have been ordered by cardiologist. 2D echo showed EF estimated at 45 to 50%, severe LVH, moderate RV enlargement and moderate RV dysfunction, PASP 44 mmHg.   Paroxysmal atrial fibrillation: Continue Xarelto and carvedilol. S/p treatment with IV heparin drip   Hypotension/shock: Resolved.  S/p treatment with IV Levophed infusion   Persistent fevers: Resolved.  S/p treatment with IV antibiotics.  No growth on blood cultures.   Obesity hypoventilation syndrome/obstructive sleep apnea: Reportedly, his mother had CPAP for 6 months.  Patient advised to go back  on CPAP nightly at home.   CKD stage IIIa: Creatinine is stable   Elevated troponin: Likely from demand ischemia   Polycythemia:  Likely secondary polycythemia from chronic hypoxic respiratory failure    Diet Order             DIET DYS 3 Room service appropriate? Yes with Assist; Fluid consistency: Thin  Diet effective now                            Consultants: Cardiologist Intensivist  Procedures: Intubation and mechanical ventilation    Medications:    atorvastatin  80 mg Oral QHS   carvedilol  6.25 mg Oral BID WC   Chlorhexidine Gluconate Cloth  6 each Topical Daily   docusate sodium  100 mg Oral BID   empagliflozin  10 mg Oral Daily   feeding supplement  237 mL Oral TID BM   insulin aspart  2-6 Units Subcutaneous Q4H   multivitamin with minerals  1 tablet Oral Daily   polyethylene glycol  17 g Oral Daily   rivaroxaban  20 mg Oral Q breakfast   sacubitril-valsartan  1 tablet Oral BID   sennosides  10 mL Oral QHS   sodium chloride flush  10-40 mL Intracatheter Q12H   spironolactone  12.5 mg Oral Daily   torsemide  60 mg Oral Daily   Continuous Infusions:   Anti-infectives (From admission, onward)    Start     Dose/Rate Route Frequency Ordered Stop   03/27/23 1000  vancomycin (VANCOREADY) IVPB 1500 mg/300 mL  Status:  Discontinued        1,500 mg 150 mL/hr over 120 Minutes Intravenous Every 24 hours 03/26/23 1101 03/28/23 1009   03/26/23 1300  vancomycin (VANCOREADY) IVPB 2000 mg/400 mL        2,000 mg 200 mL/hr over 120 Minutes Intravenous  Once 03/26/23 1101 03/26/23 1604   03/26/23 1200  ceFEPIme (MAXIPIME) 2 g in sodium chloride 0.9 % 100 mL IVPB  Status:  Discontinued        2 g 200 mL/hr over 30 Minutes Intravenous Every 8 hours 03/26/23 1054 03/28/23 1009   03/22/23 1000  cefTRIAXone (ROCEPHIN) 2 g in sodium chloride 0.9 % 100 mL IVPB        2 g 200 mL/hr over 30 Minutes Intravenous Every 24 hours 03/22/23 0750 03/26/23 0956   03/21/23 1430  azithromycin (ZITHROMAX) tablet 500 mg  Status:  Discontinued       Placed in "Followed by" Linked Group   500 mg Oral Daily  03/20/23 1418 03/21/23 0837   03/21/23 1200  azithromycin (ZITHROMAX) 500 mg in sodium chloride 0.9 % 250 mL IVPB  Status:  Discontinued        500 mg 250 mL/hr over 60 Minutes Intravenous Every 24 hours 03/21/23 0837 03/21/23 1600   03/20/23 1430  azithromycin (ZITHROMAX) 500 mg in sodium chloride 0.9 % 250 mL IVPB       Placed in "Followed by" Linked Group   500 mg 250 mL/hr over 60 Minutes Intravenous Every 24 hours 03/20/23 1418 03/20/23 1617              Family Communication/Anticipated D/C date and plan/Code Status   DVT prophylaxis:  rivaroxaban (XARELTO) tablet 20 mg     Code Status: Full Code  Family Communication: None Disposition Plan: Plan to discharge to SNF   Status is: Inpatient Remains inpatient appropriate because: CHF exacerbation  Subjective:   Interval events noted.  No shortness of breath or chest pain.  He feels better.  Objective:    Vitals:   03/30/23 0600 03/30/23 0733 03/30/23 0744 03/30/23 1208  BP:  106/78  103/71  Pulse:  92  86  Resp:      Temp: 98.1 F (36.7 C) 98.2 F (36.8 C)  97.8 F (36.6 C)  TempSrc:      SpO2:  95% 94% 95%  Weight:      Height:       No data found.   Intake/Output Summary (Last 24 hours) at 03/30/2023 1321 Last data filed at 03/29/2023 2300 Gross per 24 hour  Intake --  Output 1075 ml  Net -1075 ml   Filed Weights   03/28/23 0408 03/29/23 0500 03/30/23 0500  Weight: (!) 164 kg (!) 163.7 kg (!) 157.5 kg    Exam:  GEN: NAD SKIN: Warm and dry EYES: Eno pallor or icterus ENT: MMM CV: RRR PULM: CTA B ABD: soft, obese, NT, +BS CNS: AAO x 3, non focal EXT: No edema or tenderness        Data Reviewed:   I have personally reviewed following labs and imaging studies:  Labs: Labs show the following:   Basic Metabolic Panel: Recent Labs  Lab 03/23/23 1638 03/23/23 1921 03/24/23 0421 03/24/23 1739 03/26/23 0534 03/26/23 1726 03/27/23 0358 03/27/23 1707 03/28/23 0504  03/29/23 0828 03/30/23 0631  NA  --   --  143   < > 141   < > 143 146* 143 142 143  K  --    < > 4.1   < > 4.1   < > 4.3 4.0 4.1 4.3 4.3  CL  --   --  97*   < > 103   < > 102 106 102 103 104  CO2  --   --  32   < > 27   < > 30 29 28 28 29   GLUCOSE  --   --  142*   < > 127*   < > 151* 141* 151* 138* 108*  BUN  --   --  26*   < > 37*   < > 43* 38* 41* 45* 43*  CREATININE  --   --  1.34*   < > 1.64*   < > 1.59* 1.46* 1.53* 1.43* 1.42*  CALCIUM  --   --  8.6*   < > 8.1*   < > 8.9 8.5* 9.1 9.8 9.2  MG 2.2  --  2.2  --  2.4  --  2.8* 2.5* 2.8*  --   --   PHOS 4.8*  --  4.6  --   --   --   --   --  2.8  --   --    < > = values in this interval not displayed.   GFR Estimated Creatinine Clearance: 90.2 mL/min (A) (by C-G formula based on SCr of 1.42 mg/dL (H)). Liver Function Tests: Recent Labs  Lab 03/24/23 0421  ALBUMIN 2.8*   No results for input(s): "LIPASE", "AMYLASE" in the last 168 hours. No results for input(s): "AMMONIA" in the last 168 hours. Coagulation profile Recent Labs  Lab 03/27/23 0931  INR 1.1    CBC: Recent Labs  Lab 03/25/23 0426 03/26/23 0422 03/27/23 0358 03/28/23 0340 03/29/23 0521  WBC 4.3 6.1 6.7 9.4 6.5  HGB 18.2* 16.8 17.9* 19.3* 19.4*  HCT 56.7* 53.9* 58.4* 62.7* 63.5*  MCV 92.3 92.5 94.7 94.3 94.8  PLT 168 157 145* 153 143*   Cardiac Enzymes: No results for input(s): "CKTOTAL", "CKMB", "CKMBINDEX", "TROPONINI" in the last 168 hours. BNP (last 3 results) No results for input(s): "PROBNP" in the last 8760 hours. CBG: Recent Labs  Lab 03/29/23 2100 03/29/23 2325 03/30/23 0419 03/30/23 0727 03/30/23 1202  GLUCAP 160* 136* 180* 135* 215*   D-Dimer: No results for input(s): "DDIMER" in the last 72 hours. Hgb A1c: No results for input(s): "HGBA1C" in the last 72 hours. Lipid Profile: No results for input(s): "CHOL", "HDL", "LDLCALC", "TRIG", "CHOLHDL", "LDLDIRECT" in the last 72 hours. Thyroid function studies: No results for input(s):  "TSH", "T4TOTAL", "T3FREE", "THYROIDAB" in the last 72 hours.  Invalid input(s): "FREET3" Anemia work up: No results for input(s): "VITAMINB12", "FOLATE", "FERRITIN", "TIBC", "IRON", "RETICCTPCT" in the last 72 hours. Sepsis Labs: Recent Labs  Lab 03/26/23 0422 03/27/23 0358 03/28/23 0340 03/29/23 0521  WBC 6.1 6.7 9.4 6.5    Microbiology Recent Results (from the past 240 hours)  MRSA Next Gen by PCR, Nasal     Status: None   Collection Time: 03/21/23  1:57 AM   Specimen: Nasal Mucosa; Nasal Swab  Result Value Ref Range Status   MRSA by PCR Next Gen NOT DETECTED NOT DETECTED Final    Comment: (NOTE) The GeneXpert MRSA Assay (FDA approved for NASAL specimens only), is one component of a comprehensive MRSA colonization surveillance program. It is not intended to diagnose MRSA infection nor to guide or monitor treatment for MRSA infections. Test performance is not FDA approved in patients less than 36 years old. Performed at [facility], [address] [place], [place] [address]   Culture, Respiratory w Gram Stain     Status: None   Collection Time: 03/21/23  7:30 PM   Specimen: Tracheal Aspirate; Respiratory  Result Value Ref Range Status   Specimen Description   Final    TRACHEAL ASPIRATE Performed at [facility] [place], [address] [place], [place] [address]    Special Requests   Final    NONE Performed at [facility], [address] [place], [place] [address]    Gram Stain   Final    FEW WBC PRESENT,BOTH PMN AND MONONUCLEAR FEW GRAM POSITIVE COCCI IN PAIRS AND CHAINS    Culture   Final    RARE Normal respiratory flora-no Staph aureus or Pseudomonas seen Performed at [facility], [address], [place], [place] [address]    Report Status 03/24/2023 FINAL  Final  Culture, Respiratory w Gram Stain     Status: None   Collection Time: 03/26/23  7:25 AM   Specimen: Tracheal Aspirate; Respiratory  Result Value Ref Range Status    Specimen Description   Final    TRACHEAL ASPIRATE Performed at [facility], [address], [place], [place] [address]    Special Requests   Final    NONE Performed at [facility], [address] [place], [place] [address]    Gram Stain   Final    ABUNDANT WBC PRESENT, PREDOMINANTLY PMN NO ORGANISMS SEEN    Culture   Final    RARE METHICILLIN RESISTANT STAPHYLOCOCCUS AUREUS FEW CORYNEBACTERIUM STRIATUM Standardized susceptibility testing for this organism is not available. Performed at [facility], [address], [place], [place] [address]    Report Status 03/29/2023 FINAL  Final   Organism ID, Bacteria METHICILLIN RESISTANT STAPHYLOCOCCUS AUREUS  Final  Susceptibility   Methicillin resistant staphylococcus aureus - MIC*    CIPROFLOXACIN >=8 RESISTANT Resistant     ERYTHROMYCIN >=8 RESISTANT Resistant     GENTAMICIN <=0.5 SENSITIVE Sensitive     OXACILLIN >=4 RESISTANT Resistant     TETRACYCLINE <=1 SENSITIVE Sensitive     VANCOMYCIN <=0.5 SENSITIVE Sensitive     TRIMETH/SULFA >=320 RESISTANT Resistant     CLINDAMYCIN <=0.25 SENSITIVE Sensitive     RIFAMPIN <=0.5 SENSITIVE Sensitive     Inducible Clindamycin NEGATIVE Sensitive     LINEZOLID 4 SENSITIVE Sensitive     * RARE METHICILLIN RESISTANT STAPHYLOCOCCUS AUREUS  Expectorated Sputum Assessment w Gram Stain, Rflx to Resp Cult     Status: None   Collection Time: 03/27/23  8:36 AM   Specimen: Sputum  Result Value Ref Range Status   Specimen Description SPUTUM  Final   Special Requests  TASP  Final   Sputum evaluation   Final    THIS SPECIMEN IS ACCEPTABLE FOR SPUTUM CULTURE Performed at [facility], [address] [place], [place] [address]    Report Status 03/27/2023 FINAL  Final  Culture, Respiratory w Gram Stain     Status: None   Collection Time: 03/27/23  8:36 AM   Specimen: SPU  Result Value Ref Range Status   Specimen Description   Final    SPUTUM Performed  at [facility], [address], [place], [place] [address]    Special Requests   Final     TASP Reflexed from [phone] Performed at [facility], [address] [place], [place] [address]    Gram Stain   Final    FEW WBC PRESENT, PREDOMINANTLY PMN NO ORGANISMS SEEN    Culture   Final    Normal respiratory flora-no Staph aureus or Pseudomonas seen Performed at [facility], [address] [place], [place] [address]    Report Status 03/30/2023 FINAL  Final  Culture, blood (Routine X 2) w Reflex to ID Panel     Status: None (Preliminary result)   Collection Time: 03/27/23 12:53 PM   Specimen: BLOOD  Result Value Ref Range Status   Specimen Description BLOOD BLOOD LEFT HAND  Final   Special Requests   Final    BOTTLES DRAWN AEROBIC AND ANAEROBIC Blood Culture adequate volume   Culture   Final    NO GROWTH 3 DAYS Performed at [facility], [address], [place], [place] [address]    Report Status PENDING  Incomplete  Culture, blood (Routine X 2) w Reflex to ID Panel     Status: None (Preliminary result)   Collection Time: 03/27/23 12:59 PM   Specimen: BLOOD  Result Value Ref Range Status   Specimen Description BLOOD BLOOD RIGHT HAND  Final   Special Requests   Final    BOTTLES DRAWN AEROBIC AND ANAEROBIC Blood Culture results may not be optimal due to an inadequate volume of blood received in culture bottles   Culture   Final    NO GROWTH 3 DAYS Performed at [facility], [address] [place], [place] [address]    Report Status PENDING  Incomplete    Procedures and diagnostic studies:  DG Chest Port 1 View Result Date: 03/29/2023 CLINICAL DATA:  10026 Shortness of breath 10026 EXAM: PORTABLE CHEST 1 VIEW COMPARISON:  03/27/2023 FINDINGS: Endotracheal and enteric tubes have been removed. Right PICC line also removed. Stable cardiomegaly. Pulmonary vascular congestion. No focal airspace consolidation, pleural  effusion, or  pneumothorax. IMPRESSION: Cardiomegaly with pulmonary vascular congestion. Electronically Signed   By: Duanne Guess D.O.   On: 03/29/2023 12:08               LOS: 10 days   Tammey Deeg  [facility]   Pager on [email or web address]. If 7PM-7AM, please contact night-coverage at [email or web address]     03/30/2023, 1:21 PM

## 2023-03-30 NOTE — Progress Notes (Addendum)
 Patient ID: Gregory Mckenzie, male   DOB: 02/16/1975, 48 y.o.   MRN: [id number]     ADVANCED HF ROUNDINGNOTE   Cardiologist: Yvonne Kendall, MD  Chief Complaint: Heart Failure  Subjective:   Tolerating PO diuretics.  net -28 L since admission.  Weights unreliable.   Resting in bed. Now off HHFNC and just on [place]. Denies CP/SOB.   Objective:   Weight Range: (!) 157.5 kg Body mass index is 56.04 kg/m.   Vital Signs:   Temp:  [97.3 F (36.3 C)-98.6 F (37 C)] 98.2 F (36.8 C) (02/13 0733) Pulse Rate:  [60-107] 92 (02/13 0733) Resp:  [18-27] 18 (02/13 0427) BP: (102-160)/(68-81) 106/78 (02/13 0733) SpO2:  [93 %-100 %] 94 % (02/13 0744) Weight:  [157.5 kg] 157.5 kg (02/13 0500) Last BM Date : 03/29/22  Weight change: Filed Weights   03/28/23 0408 03/29/23 0500 03/30/23 0500  Weight: (!) 164 kg (!) 163.7 kg (!) 157.5 kg   Intake/Output:   Intake/Output Summary (Last 24 hours) at 03/30/2023 0951 Last data filed at 03/29/2023 2300 Gross per 24 hour  Intake 10 ml  Output 1675 ml  Net -1665 ml    Physical Exam  General:  well appearing.  No respiratory difficulty HEENT: +[place] Neck: supple. JVD difficult to see. Carotids 2+ bilat; no bruits. No lymphadenopathy or thyromegaly appreciated. Cor: PMI nondisplaced. Tachy/regular rate & rhythm. No rubs, gallops or murmurs. Lungs: clear, diminished bases Abdomen: obese, soft, nontender, nondistended. No hepatosplenomegaly. No bruits or masses. Good bowel sounds. Extremities: no cyanosis, clubbing, rash, trace BLE edema  Neuro: alert & oriented x 3, cranial nerves grossly intact. moves all 4 extremities w/o difficulty. Affect pleasant.   Tele    NSR 90s (Personally reviewed)    Labs    CBC Recent Labs    03/28/23 0340 03/29/23 0521  WBC 9.4 6.5  HGB 19.3* 19.4*  HCT 62.7* 63.5*  MCV 94.3 94.8  PLT 153 143*   Basic Metabolic Panel Recent Labs    41/32/44 1707 03/28/23 0504 03/29/23 0828 03/30/23 0631  NA 146*  143 142 143  K 4.0 4.1 4.3 4.3  CL 106 102 103 104  CO2 29 28 28 29   GLUCOSE 141* 151* 138* 108*  BUN 38* 41* 45* 43*  CREATININE 1.46* 1.53* 1.43* 1.42*  CALCIUM 8.5* 9.1 9.8 9.2  MG 2.5* 2.8*  --   --   PHOS  --  2.8  --   --    Liver Function Tests No results for input(s): "AST", "ALT", "ALKPHOS", "BILITOT", "PROT", "ALBUMIN" in the last 72 hours.  No results for input(s): "LIPASE", "AMYLASE" in the last 72 hours. Cardiac Enzymes No results for input(s): "CKTOTAL", "CKMB", "CKMBINDEX", "TROPONINI" in the last 72 hours.  BNP: BNP (last 3 results) Recent Labs    03/07/23 1207 03/09/23 0957 03/20/23 1046  BNP 2,259.3* 1,857.9* 1,596.6*    ProBNP (last 3 results) No results for input(s): "PROBNP" in the last 8760 hours.   D-Dimer No results for input(s): "DDIMER" in the last 72 hours. Hemoglobin A1C No results for input(s): "HGBA1C" in the last 72 hours. Fasting Lipid Panel No results for input(s): "CHOL", "HDL", "LDLCALC", "TRIG", "CHOLHDL", "LDLDIRECT" in the last 72 hours.  Thyroid Function Tests No results for input(s): "TSH", "T4TOTAL", "T3FREE", "THYROIDAB" in the last 72 hours.  Invalid input(s): "FREET3"  Other results:   Imaging    No results found.    Medications:     Scheduled Medications:  atorvastatin  80 mg Oral QHS   carvedilol  6.25 mg Oral BID WC   Chlorhexidine Gluconate Cloth  6 each Topical Daily   docusate sodium  100 mg Oral BID   empagliflozin  10 mg Oral Daily   feeding supplement  237 mL Oral TID BM   insulin aspart  2-6 Units Subcutaneous Q4H   ipratropium-albuterol  3 mL Nebulization Q6H   multivitamin with minerals  1 tablet Oral Daily   polyethylene glycol  17 g Oral Daily   rivaroxaban  20 mg Oral Q breakfast   sacubitril-valsartan  1 tablet Oral BID   sennosides  10 mL Oral QHS   sodium chloride flush  10-40 mL Intracatheter Q12H   spironolactone  12.5 mg Oral Daily   torsemide  60 mg Oral Daily     Infusions:    PRN Medications: acetaminophen, albuterol, bisacodyl, ondansetron **OR** ondansetron (ZOFRAN) IV, mouth rinse, sodium chloride flush    Patient Profile  Gregory Mckenzie is 5 uyear old with history HFpEF, DMII, HLD, OSA, HTN, PAF, chronic hypoxic respiratory failure on 2-4 liters, and polycythemia. 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 Family history of HTN Dad/Brother. Brother also had PE.   Admitted with A/C Hypoxic Respiratory Failure and A/C HFpEF Assessment/Plan  1. Acute on chronic HF with mid-range EF: Significant RV dysfunction and likely significant pulmonary hypertension in the setting of OHS/OSA. Echo this admission showed EF 45-50%, severe LVH, moderate RV enlargement and moderate RV dysfunction, PASP 44 mmHg.  - Continue Torsemide 60 mg daily. Add diamox 250 BID today - SCr stable around his baseline.  - Continue Entresto 24-26 mg BID - Start Jardiance 10 mg daily - Continue coreg 6.25 mg BID, would avoid titration for now with recent shock - Continue spironolactone 12.5 mg daily - RHC tomorrow to assess filling pressures/PA pressure.  - Continue follow daily weights, strict I's and O's and renal function while diuresing  2. Hypotension/shock: Suspect this is due to combination of RV failure and sedation with intubation.   - resolved  3. Acute hypoxemic/hypercarbic respiratory failure: In setting of OHS/OSA (on home oxygen 3-4 L, has not had CPAP for 6 months) and CHF/volume overload.  Required intubation.  - Continue diuresis as above - Will need to get back on CPAP at home. - Stable on [place] now   4. CKD stage 3a: Creatinine at baseline - SCr baseline (1.4-1.6).   - follow with diuresis - 1.4 today  5. Polycythemia: Likely due to chronic hypoxemia.   6. Elevated troponin: Mild elevation, no trend.  - Had cath in 2022 with minimal nonobstructive CAD.  - Continue atorvastatin 80 mg daily  7. Atrial fibrillation:  -this is paroxysmal  -Currently  maintaining normal sinus rhythm on telemetry -Continue Xarelto  8. DM2: SSI.   9. ID - blood cultures 2/10 NGTD - TMax 103 2/11. Remains afebrile now - central line and foley removed.  - Abx course completed 2/11   Alen Bleacher AGACNP-BC  03/30/2023 9:51 AM  Patient seen and examined with the above-signed Advanced Practice Provider and/or Housestaff. I personally reviewed laboratory data, imaging studies and relevant notes. I independently examined the patient and formulated the important aspects of the plan. I have edited the note to reflect any of my changes or salient points. I have personally discussed the plan with the patient and/or family.  Feels ok. Denies CP or SOB at rest. Has diuresed well. BP stable. Renal function stable  Says he has not worn  BIPAP or CPAP in years.   General:  Chronically-ill appearing. No resp difficulty HEENT: normal Neck: supple. JVP hard to see but looks elevated   WGN:FAOZHYQ 2/6 TR Lungs: decreased  Abdomen: obese soft, nontender, nondistended. No bruits or masses. Good bowel sounds. Extremities: no cyanosis, clubbing, rash, 1+ edema Neuro: alert & orientedx3, cranial nerves grossly intact. moves all 4 extremities w/o difficulty. Affect pleasant  He has advanced cor pulmonale due to untreated OSA/OHS. Volume status improved but likely not yet optimized.   Will plan RHC tomorrow. Procedure d/w patient and he agrees to proceed.   Will need close Pulmonary f/u to re-initiate BIPAP. Will also need weight loss  Continue torsemide. Will give diamox - 2 doses today. D/w Pharmd.   Arvilla Meres, MD  12:33 PM

## 2023-03-30 NOTE — [place] FL2 (Signed)
[place] MEDICAID FL2 LEVEL OF CARE FORM     IDENTIFICATION  Patient Name: Gregory Mckenzie Birthdate: 01-Aug-1975 Sex: male Admission Date (Current Location): 03/20/2023  [place] and [place] Number:  Chiropodist and Address:  [facility], [address], [place], [place] [address]      Provider Number: [id number]  Attending Physician Name and Address:  Lurene Shadow, MD  Relative Name and Phone Number:  Ernesto, Zukowski (Sister)  [phone] [facility])    Current Level of Care: Hospital Recommended Level of Care: Skilled Nursing Facility Prior Approval Number:    Date Approved/Denied:   PASRR Number: [id number]  Discharge Plan: Home    Current Diagnoses: Patient Active Problem List   Diagnosis Date Noted   Acute hypoxic respiratory failure (HCC) 03/27/2023   RVF (right ventricular failure) (HCC) 03/26/2023   Cardiogenic shock (HCC) 03/26/2023   PAF (paroxysmal atrial fibrillation) (HCC) 03/26/2023   CHF exacerbation (HCC) 03/20/2023   NSTEMI (non-ST elevated myocardial infarction) (HCC) 03/20/2023   Hypoxia    Acute kidney injury superimposed on chronic kidney disease (HCC) 05/16/2021   Acute on chronic heart failure with mildly reduced ejection fraction (HFmrEF, 41-49%) (HCC) 05/14/2021   CKD stage 3a, GFR 45-59 ml/min (HCC) 05/14/2021   Elevated troponin 05/14/2021   Non compliance w medication regimen 05/14/2021   Paroxysmal atrial flutter (HCC)    Cardiomyopathy (HCC)    Acute combined systolic and diastolic heart failure (HCC)    Bilateral lower extremity edema    Hypertension    Type 2 diabetes mellitus with complication, without long-term current use of insulin (HCC)    Continuous dependence on cigarette smoking    Marijuana use    Class 3 severe obesity due to excess calories with serious comorbidity and body mass index (BMI) of 45.0 to 49.9 in adult [facility])    Acute respiratory failure with hypoxia and hypercapnia (HCC)     Anasarca    Dyspnea 04/07/2020    Orientation RESPIRATION BLADDER Height & Weight     Self, Time, Situation, Place  O2 (026L pernasal cannula) Incontinent Weight: (!) 157.5 kg Height:  5\' 6"  (167.6 cm)  BEHAVIORAL SYMPTOMS/MOOD NEUROLOGICAL BOWEL NUTRITION STATUS  Other (Comment) (n/a)  (n/a) Continent Diet (DYS 3)  AMBULATORY STATUS COMMUNICATION OF NEEDS Skin   Limited Assist Verbally Other (Comment) (Abrasion R ankle)                       Personal Care Assistance Level of Assistance  Bathing, Dressing Bathing Assistance: Limited assistance   Dressing Assistance: Limited assistance     Functional Limitations Info  Sight, Hearing Sight Info: Adequate Hearing Info: Adequate      SPECIAL CARE FACTORS FREQUENCY  PT (By licensed PT), OT (By licensed OT)     PT Frequency: Min 2x weekly OT Frequency: Min 2x weekly            Contractures Contractures Info: Not present    Additional Factors Info  Code Status, Allergies Code Status Info: FULL Allergies Info: No Known Allergies           Current Medications (03/30/2023):  This is the current hospital active medication list Current Facility-Administered Medications  Medication Dose Route Frequency Provider Last Rate Last Admin   acetaminophen (TYLENOL) tablet 975 mg  975 mg Oral Q6H PRN Tressie Ellis, RPH       acetaZOLAMIDE (DIAMOX) tablet 250 mg  250 mg Oral BID Bensimhon, Bevelyn Buckles, MD  albuterol (PROVENTIL) (2.5 MG/3ML) 0.083% nebulizer solution 2.5 mg  2.5 mg Nebulization Q2H PRN Floydene Flock, MD       atorvastatin (LIPITOR) tablet 80 mg  80 mg Oral QHS Effie Shy, RPH   80 mg at 03/29/23 2204   bisacodyl (DULCOLAX) suppository 10 mg  10 mg Rectal Daily PRN Jimmye Norman, NP   10 mg at 03/27/23 1102   carvedilol (COREG) tablet 6.25 mg  6.25 mg Oral BID WC Assaker, [place] Bali, MD   6.25 mg at 03/30/23 1610   Chlorhexidine Gluconate Cloth 2 % PADS 6 each  6 each Topical Daily  Floydene Flock, MD   6 each at 03/30/23 0853   docusate sodium (COLACE) capsule 100 mg  100 mg Oral BID Effie Shy, RPH   100 mg at 03/29/23 2204   empagliflozin (JARDIANCE) tablet 10 mg  10 mg Oral Daily Brynda Peon L, NP   10 mg at 03/30/23 0846   feeding supplement (ENSURE ENLIVE / ENSURE PLUS) liquid 237 mL  237 mL Oral TID BM Assaker, [place], MD   237 mL at 03/30/23 1019   insulin aspart (novoLOG) injection 2-6 Units  2-6 Units Subcutaneous Q4H Ezequiel Essex, NP   6 Units at 03/30/23 1246   multivitamin with minerals tablet 1 tablet  1 tablet Oral Daily Assaker, [place], MD   1 tablet at 03/30/23 0846   ondansetron (ZOFRAN) tablet 4 mg  4 mg Oral Q6H PRN Floydene Flock, MD       Or   ondansetron [facility]) injection 4 mg  4 mg Intravenous Q6H PRN Floydene Flock, MD       Oral care mouth rinse  15 mL Mouth Rinse PRN Assaker, [place] Bali, MD       polyethylene glycol (MIRALAX / GLYCOLAX) packet 17 g  17 g Oral Daily Effie Shy, RPH   17 g at 03/29/23 1028   rivaroxaban (XARELTO) tablet 20 mg  20 mg Oral Q breakfast Brynda Peon L, NP   20 mg at 03/30/23 9604   sacubitril-valsartan (ENTRESTO) 24-26 mg per tablet  1 tablet Oral BID Alen Bleacher, NP   1 tablet at 03/30/23 0846   sennosides (SENOKOT) 8.8 MG/5ML syrup 10 mL  10 mL Oral QHS Effie Shy, RPH   10 mL at 03/29/23 2207   sodium chloride flush (NS) 0.9 % injection 10-40 mL  10-40 mL Intracatheter Q12H Dgayli, Lianne Bushy, MD   10 mL at 03/30/23 5409   sodium chloride flush (NS) 0.9 % injection 10-40 mL  10-40 mL Intracatheter PRN Raechel Chute, MD       spironolactone (ALDACTONE) tablet 12.5 mg  12.5 mg Oral Daily Effie Shy, RPH   12.5 mg at 03/30/23 0846   torsemide (DEMADEX) tablet 60 mg  60 mg Oral Daily Janann Colonel, MD   60 mg at 03/30/23 8119     Discharge Medications: Please see discharge summary for a list of discharge medications.  Relevant Imaging Results:  Relevant Lab  Results:   Additional Information SSN# [id number]  Truddie Hidden, RN

## 2023-03-30 NOTE — TOC Progression Note (Addendum)
Transition of Care [facility]) - Progression Note    Patient Details  Name: Gregory Mckenzie MRN: [id number] Date of Birth: 1976-01-21  [facility]) CM/SW Contact  Truddie Hidden, RN Phone Number: 03/30/2023, 2:30 PM  Clinical Narrative:    Spoke with patient at bedside regarding therapy recommendation for SNF. Patient is agreeable and does not have a choice of facility. He was advised his insurance payor is not widely accepted at most SNF which will likely lead to out of county bed offers. He is agreeable to [place] out of county for SNF.   Bed search started.           Expected Discharge Plan and Services                                               Social Determinants of Health (SDOH) Interventions SDOH Screenings   Food Insecurity: Patient Unable To Answer (03/21/2023)  Housing: Patient Unable To Answer (03/21/2023)  Transportation Needs: Patient Unable To Answer (03/21/2023)  Utilities: Patient Unable To Answer (03/21/2023)  Alcohol Screen: Low Risk  (04/09/2020)  Depression (PHQ2-9): Low Risk  (06/15/2021)  Financial Resource Strain: Low Risk  (04/09/2020)  Physical Activity: Inactive (04/09/2020)  Tobacco Use: Medium Risk (03/20/2023)    Readmission Risk Interventions     No data to display

## 2023-03-30 NOTE — Progress Notes (Signed)
OT Cancellation Note  Patient Details Name: Gregory Mckenzie MRN: [id number] DOB: [birth year]   Cancelled Treatment:    Reason Eval/Treat Not Completed: Medical issues which prohibited therapy. Spoke with PTA. Pt actively vomiting earlier, pending swallow study. Will re-attempt OT tx at later date/time as appropriate.   Arman Filter., MPH, MS, OTR/L ascom [phone] 03/30/23, 1:27 PM

## 2023-03-31 ENCOUNTER — Encounter
Admission: EM | Disposition: A | Discharge status: Home / Self Care | Payer: Self-pay | Source: Ambulatory Visit | Attending: Student in an Organized Health Care Education/Training Program

## 2023-03-31 ENCOUNTER — Encounter (HOSPITAL_BASED_OUTPATIENT_CLINIC_OR_DEPARTMENT_OTHER): Payer: Medicaid Other | Admitting: Pulmonary Disease

## 2023-03-31 DIAGNOSIS — I272 Pulmonary hypertension, unspecified: Secondary | ICD-10-CM | POA: Diagnosis not present

## 2023-03-31 DIAGNOSIS — I5033 Acute on chronic diastolic (congestive) heart failure: Secondary | ICD-10-CM | POA: Diagnosis not present

## 2023-03-31 DIAGNOSIS — I2609 Other pulmonary embolism with acute cor pulmonale: Secondary | ICD-10-CM | POA: Diagnosis not present

## 2023-03-31 DIAGNOSIS — I5043 Acute on chronic combined systolic (congestive) and diastolic (congestive) heart failure: Secondary | ICD-10-CM | POA: Diagnosis not present

## 2023-03-31 HISTORY — PX: RIGHT HEART CATH: CATH118263

## 2023-03-31 LAB — GLUCOSE, CAPILLARY
Glucose-Capillary: 139 mg/dL — ABNORMAL HIGH (ref 70–99)
Glucose-Capillary: 125 mg/dL — ABNORMAL HIGH (ref 70–99)
Glucose-Capillary: 141 mg/dL — ABNORMAL HIGH (ref 70–99)

## 2023-03-31 LAB — BASIC METABOLIC PANEL
Anion gap: 10 (ref 5–15)
BUN: 48 mg/dL — ABNORMAL HIGH (ref 6–20)
CO2: 29 mmol/L (ref 22–32)
Calcium: 9 mg/dL (ref 8.9–10.3)
Chloride: 99 mmol/L (ref 98–111)
Creatinine, Ser: 1.52 mg/dL — ABNORMAL HIGH (ref 0.61–1.24)
GFR, Estimated: 56 mL/min — ABNORMAL LOW (ref >60)
Glucose, Bld: 110 mg/dL — ABNORMAL HIGH (ref 70–99)
Potassium: 3.5 mmol/L (ref 3.5–5.1)
Sodium: 138 mmol/L (ref 135–145)

## 2023-03-31 LAB — POCT I-STAT EG7
Acid-Base Excess: 1 mmol/L (ref 0.0–2.0)
Acid-Base Excess: 2 mmol/L (ref 0.0–2.0)
Bicarbonate: 30 mmol/L — ABNORMAL HIGH (ref 20.0–28.0)
Bicarbonate: 32.9 mmol/L — ABNORMAL HIGH (ref 20.0–28.0)
Calcium, Ion: 0.85 mmol/L — CL (ref 1.15–1.40)
Calcium, Ion: 1.15 mmol/L (ref 1.15–1.40)
HCT: 58 % — ABNORMAL HIGH (ref 39.0–52.0)
HCT: 64 % — ABNORMAL HIGH (ref 39.0–52.0)
Hemoglobin: 19.7 g/dL — ABNORMAL HIGH (ref 13.0–17.0)
Hemoglobin: 21.8 g/dL (ref 13.0–17.0)
O2 Saturation: 67 %
O2 Saturation: 71 %
Potassium: 2.6 mmol/L — CL (ref 3.5–5.1)
Potassium: 3.4 mmol/L — ABNORMAL LOW (ref 3.5–5.1)
Sodium: 142 mmol/L (ref 135–145)
Sodium: 147 mmol/L — ABNORMAL HIGH (ref 135–145)
TCO2: 32 mmol/L (ref 22–32)
TCO2: 35 mmol/L — ABNORMAL HIGH (ref 22–32)
pCO2, Ven: 63.9 mm[Hg] — ABNORMAL HIGH (ref 44–60)
pCO2, Ven: 70.8 mm[Hg] (ref 44–60)
pH, Ven: 7.275 (ref 7.25–7.43)
pH, Ven: 7.28 (ref 7.25–7.43)
pO2, Ven: 41 mm[Hg] (ref 32–45)
pO2, Ven: 43 mm[Hg] (ref 32–45)

## 2023-03-31 SURGERY — RIGHT HEART CATH
Anesthesia: Moderate Sedation

## 2023-03-31 MED ORDER — SODIUM CHLORIDE 0.9 % IV SOLN: INTRAVENOUS | Status: DC

## 2023-03-31 MED ORDER — LIDOCAINE HCL (PF) 1 % IJ SOLN
INTRAMUSCULAR | Status: DC | PRN
  Administered 2023-03-31: 2 mL

## 2023-03-31 MED ORDER — HEPARIN (PORCINE) IN NACL 2000-0.9 UNIT/L-% IV SOLN
INTRAVENOUS | Status: DC | PRN
Start: 2023-03-31 — End: 2023-03-31
  Administered 2023-03-31: 1000 mL

## 2023-03-31 SURGICAL SUPPLY — 9 items
CANNULA 5F STIFF (CANNULA) IMPLANT
CATH SWAN GANZ 7F STRAIGHT (CATHETERS) IMPLANT
DRAPE BRACHIAL (DRAPES) IMPLANT
GLIDESHEATH SLENDER 7FR .021G (SHEATH) IMPLANT
PACK CARDIAC CATH (CUSTOM PROCEDURE TRAY) ×1 IMPLANT
PROTECTION STATION PRESSURIZED (MISCELLANEOUS) ×1 IMPLANT
SET ATX-X65L (MISCELLANEOUS) IMPLANT
SHEATH GLIDE SLENDER 4/5FR (SHEATH) IMPLANT
STATION PROTECTION PRESSURIZED (MISCELLANEOUS) IMPLANT

## 2023-03-31 NOTE — TOC Transition Note (Signed)
Transition of Care [facility]) - Discharge Note   Patient Details  Name: Gregory Mckenzie MRN: [id number] Date of Birth: [birth year]  Transition of Care [facility]) CM/SW Contact:  Hetty Ely, RN Phone Number: 03/31/2023, 11:04 AM   Clinical Narrative:  Spoke with patient at bedside and wife on the phone about discharge plans for SNF. Patient says he is able to return home with wife there 24/7. Patient says he can walk to the restroom and wife can assist if needed. They manage/ live in a [facility], and prefers to discharge there. Wife agrees that she will be there 24/7.           Patient Goals and CMS Choice            Discharge Placement                       Discharge Plan and Services Additional resources added to the After Visit Summary for                                       Social Drivers of Health (SDOH) Interventions SDOH Screenings   Food Insecurity: Patient Unable To Answer (03/21/2023)  Housing: Patient Unable To Answer (03/21/2023)  Transportation Needs: Patient Unable To Answer (03/21/2023)  Utilities: Patient Unable To Answer (03/21/2023)  Alcohol Screen: Low Risk  (04/09/2020)  Depression (PHQ2-9): Low Risk  (06/15/2021)  Financial Resource Strain: Low Risk  (04/09/2020)  Physical Activity: Inactive (04/09/2020)  Tobacco Use: Medium Risk (03/20/2023)     Readmission Risk Interventions     No data to display

## 2023-03-31 NOTE — H&P (View-Only) (Signed)
Patient ID: Gregory Mckenzie, male   DOB: 02-27-1975, 48 y.o.   MRN: [id number]     ADVANCED HF ROUNDINGNOTE   Cardiologist: Yvonne Kendall, MD  Chief Complaint: Heart Failure  Subjective:    Diuresed well with diamox. Feels great. Eager to go home. Denies CP or SOB.   Objective:   Weight Range: (!) 157.5 kg Body mass index is 56.04 kg/m.   Vital Signs:   Temp:  [97.7 F (36.5 C)-98.2 F (36.8 C)] 97.7 F (36.5 C) (02/14 1203) Pulse Rate:  [46-87] 72 (02/14 1203) Resp:  [20-27] 27 (02/14 1203) BP: (101-129)/(57-96) 113/79 (02/14 1203) SpO2:  [91 %-100 %] 92 % (02/14 1203) Last BM Date : 03/31/23  Weight change: Filed Weights   03/28/23 0408 03/29/23 0500 03/30/23 0500  Weight: (!) 164 kg (!) 163.7 kg (!) 157.5 kg   Intake/Output:   Intake/Output Summary (Last 24 hours) at 03/31/2023 1224 Last data filed at 03/30/2023 2246 Gross per 24 hour  Intake 240 ml  Output 350 ml  Net -110 ml    Physical Exam   General:  Ambulating room. No resp difficulty HEENT: normal Neck: supple.JVP hard to see . Carotids 2+ bilat; no bruits. No lymphadenopathy or thryomegaly appreciated. Cor: PMI nondisplaced. Regular rate & rhythm. 2/6 TR Lungs: clear Abdomen: obese soft, nontender, nondistended. No hepatosplenomegaly. No bruits or masses. Good bowel sounds. Extremities: no cyanosis, clubbing, rash, edema Neuro: alert & orientedx3, cranial nerves grossly intact. moves all 4 extremities w/o difficulty. Affect pleasant   Tele    NSR 70-80s Personally reviewed  Labs    CBC Recent Labs    03/29/23 0521  WBC 6.5  HGB 19.4*  HCT 63.5*  MCV 94.8  PLT 143*   Basic Metabolic Panel Recent Labs    40/98/11 0631 03/31/23 0639  NA 143 138  K 4.3 3.5  CL 104 99  CO2 29 29  GLUCOSE 108* 110*  BUN 43* 48*  CREATININE 1.42* 1.52*  CALCIUM 9.2 9.0   Liver Function Tests No results for input(s): "AST", "ALT", "ALKPHOS", "BILITOT", "PROT", "ALBUMIN" in the last 72  hours.  No results for input(s): "LIPASE", "AMYLASE" in the last 72 hours. Cardiac Enzymes No results for input(s): "CKTOTAL", "CKMB", "CKMBINDEX", "TROPONINI" in the last 72 hours.  BNP: BNP (last 3 results) Recent Labs    03/07/23 1207 03/09/23 0957 03/20/23 1046  BNP 2,259.3* 1,857.9* 1,596.6*    ProBNP (last 3 results) No results for input(s): "PROBNP" in the last 8760 hours.   D-Dimer No results for input(s): "DDIMER" in the last 72 hours. Hemoglobin A1C No results for input(s): "HGBA1C" in the last 72 hours. Fasting Lipid Panel No results for input(s): "CHOL", "HDL", "LDLCALC", "TRIG", "CHOLHDL", "LDLDIRECT" in the last 72 hours.  Thyroid Function Tests No results for input(s): "TSH", "T4TOTAL", "T3FREE", "THYROIDAB" in the last 72 hours.  Invalid input(s): "FREET3"  Other results:   Imaging    No results found.    Medications:     Scheduled Medications:  [MAR Hold] atorvastatin  80 mg Oral QHS   [MAR Hold] carvedilol  6.25 mg Oral BID WC   [MAR Hold] Chlorhexidine Gluconate Cloth  6 each Topical Daily   [MAR Hold] docusate sodium  100 mg Oral BID   [MAR Hold] empagliflozin  10 mg Oral Daily   [MAR Hold] feeding supplement  237 mL Oral TID BM   [MAR Hold] insulin aspart  2-6 Units Subcutaneous Q4H   [MAR Hold] multivitamin with minerals  1 tablet Oral Daily   [MAR Hold] polyethylene glycol  17 g Oral Daily   [MAR Hold] rivaroxaban  20 mg Oral Q breakfast   [MAR Hold] sacubitril-valsartan  1 tablet Oral BID   [MAR Hold] sennosides  10 mL Oral QHS   [MAR Hold] sodium chloride flush  10-40 mL Intracatheter Q12H   [MAR Hold] spironolactone  12.5 mg Oral Daily   [MAR Hold] torsemide  60 mg Oral Daily    Infusions:  [START ON 04/01/2023] sodium chloride 10 mL/hr at 03/31/23 1205     PRN Medications: [MAR Hold] acetaminophen, [MAR Hold] albuterol, [MAR Hold] bisacodyl, [MAR Hold] ondansetron **OR** [MAR Hold] ondansetron (ZOFRAN) IV, [MAR Hold] mouth  rinse, [MAR Hold] sodium chloride flush    Patient Profile  [name] is 55 uyear old with history HFpEF, DMII, HLD, OSA, HTN, PAF, chronic hypoxic respiratory failure on 2-4 liters, and polycythemia. 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 Family history of HTN Dad/Brother. Brother also had PE.   Admitted with A/C Hypoxic Respiratory Failure and A/C HFpEF Assessment/Plan  1. Acute on chronic HF with mid-range EF: Significant RV dysfunction and likely significant pulmonary hypertension in the setting of OHS/OSA. Echo this admission showed EF 45-50%, severe LVH, moderate RV enlargement and moderate RV dysfunction, PASP 44 mmHg.  - Volume status appears optimized - Continue Torsemide 60 mg daily.  - SCr stable around his baseline.  - Continue Entresto 24-26 mg BID - Continue Jardiance 10 mg daily - Continue coreg 6.25 mg BID, would avoid titration for now with recent shock - Continue spironolactone 12.5 mg daily - Will plan RHC today to assess volume status and degree of PH  2. Hypotension/shock: Suspect this is due to combination of RV failure and sedation with intubation.   - resolved  3. Acute hypoxemic/hypercarbic respiratory failure: In setting of OHS/OSA (on home oxygen 3-4 L, has not had CPAP for 6 months) and CHF/volume overload.  Required intubation.  - Much improved with diuresis - suspect advanced OSA/OHS - Will need Pulmonary f/u to resume BIPAP ASAP    4. CKD stage 3a: Creatinine at baseline - SCr baseline (1.4-1.6).   - Stable 1.5 today  5. Polycythemia: Likely due to chronic hypoxemia.   6. Elevated troponin: Mild elevation, no trend.  - Had cath in 2022 with minimal nonobstructive CAD.  - Continue atorvastatin 80 mg daily  7. Atrial fibrillation:  -this is paroxysmal  -Currently maintaining normal sinus rhythm on telemetry -Continue Xarelto. No bleeding   8. DM2: SSI.   9. ID - blood cultures 2/10 NGTD - TMax 103 2/11. Remains afebrile now - central line and  foley removed.  - Abx course completed 2/11  Plan RHC today. Likely stable for d/c after RHC from our standpoint with close f/u in HF Clinic and Pulmonary.    Arvilla Meres MD 03/31/2023 12:24 PM

## 2023-03-31 NOTE — Progress Notes (Signed)
Progress Note    Gregory Mckenzie  ZHY:[id number] DOB: 03-Aug-1975  DOA: 03/20/2023 PCP: Pcp, No      Brief Narrative:    Medical records reviewed and are as summarized below:  Gregory Mckenzie is a 48 y.o. male significant PMH of chronic combined systolic and diastolic CHF, atrial fibrillation/flutter, T2DM, HLD, HTN, OSA not on CPAP, obesity hypoventilation syndrome, former smoker, morbid obesity, polycythemia, who was referred from the heart failure clinic because of acute respiratory distress.  Reportedly, he was seen in the heart failure clinic for exertional shortness of breath, fatigue, worsening abdominal distention, increasing pedal edema weight gain and cough.  He had run out of his oxygen the night prior to admission.  He was noted to have oxygen saturation 67% on room air at the CHF clinic.  He was subsequently referred to the ED for further management.    Significant Hospital Events: Including procedures, antibiotic start and stop dates in addition to other pertinent events   2/3: Admitted to hospitalist service with acute on chronic hypoxic hypercapnic respiratory failure in the setting of CHF exacerbation. Failed BiPAP requiring intubation.  PCCM consulted 2/4: remains intubated, on diuresis, PICC line placed 2/5: aggressively diuresed, improved oxygen requirements 2/6: remains intubated, Co-ox 67% this AM. Kidney function improved 2/7: intubated, follows commands, I/O -22L, kidney function improved 2/8: creatinine bumped, follows commands off sedation, I/O -23L 2/9 remains on vent, I/O -26L.  02/11 - Extubated to [facility]. Continues to diurese well.    Patient was transferred to the hospitalist service 03/30/2023. Chart reviewed.   Assessment/Plan:   Principal Problem:   CHF exacerbation (HCC) Active Problems:   Acute respiratory failure with hypoxia and hypercapnia (HCC)   NSTEMI (non-ST elevated myocardial infarction) (HCC)   Acute kidney injury  superimposed on chronic kidney disease (HCC)   Type 2 diabetes mellitus with complication, without long-term current use of insulin (HCC)   Class 3 severe obesity due to excess calories with serious comorbidity and body mass index (BMI) of 45.0 to 49.9 in adult [facility])   Paroxysmal atrial flutter (HCC)   Acute on chronic heart failure with mildly reduced ejection fraction (HFmrEF, 41-49%) (HCC)   CKD stage 3a, GFR 45-59 ml/min (HCC)   RVF (right ventricular failure) (HCC)   Cardiogenic shock (HCC)   PAF (paroxysmal atrial fibrillation) (HCC)   Acute hypoxic respiratory failure (HCC)   Nutrition Problem: Inadequate oral intake Etiology: inability to eat (pt sedated and ventilated)  Signs/Symptoms: NPO status   Body mass index is 56.04 kg/m.  (Morbid obesity)   Acute on chronic hypoxic and hypercapnic respiratory failure: Improved.  He is on 6 L/min oxygen via nasal cannula.  He said he is 4 L at home. Wean down oxygen to 4 L/min as able S/p intubation on 03/20/2023 and extubated on 03/28/2023.   Acute exacerbation of chronic heart failure with midrange ejection fraction, significant right ventricular dysfunction and probable significant pulmonary hypertension: Continue torsemide, Entresto, Coreg, Jardiance and spironolactone.   Plan for right heart cath today. 2D echo showed EF estimated at 45 to 50%, severe LVH, moderate RV enlargement and moderate RV dysfunction, PASP 44 mmHg.   Paroxysmal atrial fibrillation: Continue Xarelto and carvedilol. S/p treatment with IV heparin drip   Hypotension/shock: Resolved.  S/p treatment with IV Levophed infusion   Persistent fevers: Resolved.  S/p treatment with IV antibiotics.  No growth on blood cultures.   Obesity hypoventilation syndrome/obstructive sleep apnea: Reportedly, he has not used CPAP  for 6 months.  Patient advised to go back on CPAP nightly at home.   CKD stage IIIa: Creatinine is stable   Elevated troponin: Likely from  demand ischemia   Polycythemia: Likely secondary polycythemia from chronic hypoxic respiratory failure    Diet Order             Diet NPO time specified Except for: Sips with Meds  Diet effective midnight           DIET DYS 3 Room service appropriate? Yes with Assist; Fluid consistency: Thin  Diet effective now                            Consultants: Cardiologist Intensivist  Procedures: Intubation and mechanical ventilation    Medications:    [MAR Hold] atorvastatin  80 mg Oral QHS   [MAR Hold] carvedilol  6.25 mg Oral BID WC   [MAR Hold] Chlorhexidine Gluconate Cloth  6 each Topical Daily   [MAR Hold] docusate sodium  100 mg Oral BID   [MAR Hold] empagliflozin  10 mg Oral Daily   [MAR Hold] feeding supplement  237 mL Oral TID BM   [MAR Hold] insulin aspart  2-6 Units Subcutaneous Q4H   [MAR Hold] multivitamin with minerals  1 tablet Oral Daily   [MAR Hold] polyethylene glycol  17 g Oral Daily   [MAR Hold] rivaroxaban  20 mg Oral Q breakfast   [MAR Hold] sacubitril-valsartan  1 tablet Oral BID   [MAR Hold] sennosides  10 mL Oral QHS   [MAR Hold] sodium chloride flush  10-40 mL Intracatheter Q12H   [MAR Hold] spironolactone  12.5 mg Oral Daily   [MAR Hold] torsemide  60 mg Oral Daily   Continuous Infusions:  [START ON 04/01/2023] sodium chloride 10 mL/hr at 03/31/23 1205     Anti-infectives (From admission, onward)    Start     Dose/Rate Route Frequency Ordered Stop   03/27/23 1000  vancomycin (VANCOREADY) IVPB 1500 mg/300 mL  Status:  Discontinued        1,500 mg 150 mL/hr over 120 Minutes Intravenous Every 24 hours 03/26/23 1101 03/28/23 1009   03/26/23 1300  vancomycin (VANCOREADY) IVPB 2000 mg/400 mL        2,000 mg 200 mL/hr over 120 Minutes Intravenous  Once 03/26/23 1101 03/26/23 1604   03/26/23 1200  ceFEPIme (MAXIPIME) 2 g in sodium chloride 0.9 % 100 mL IVPB  Status:  Discontinued        2 g 200 mL/hr over 30 Minutes Intravenous Every  8 hours 03/26/23 1054 03/28/23 1009   03/22/23 1000  cefTRIAXone (ROCEPHIN) 2 g in sodium chloride 0.9 % 100 mL IVPB        2 g 200 mL/hr over 30 Minutes Intravenous Every 24 hours 03/22/23 0750 03/26/23 0956   03/21/23 1430  azithromycin (ZITHROMAX) tablet 500 mg  Status:  Discontinued       Placed in "Followed by" Linked Group   500 mg Oral Daily 03/20/23 1418 03/21/23 0837   03/21/23 1200  azithromycin (ZITHROMAX) 500 mg in sodium chloride 0.9 % 250 mL IVPB  Status:  Discontinued        500 mg 250 mL/hr over 60 Minutes Intravenous Every 24 hours 03/21/23 0837 03/21/23 1600   03/20/23 1430  azithromycin (ZITHROMAX) 500 mg in sodium chloride 0.9 % 250 mL IVPB       Placed in "Followed by" Linked Group  500 mg 250 mL/hr over 60 Minutes Intravenous Every 24 hours 03/20/23 1418 03/20/23 1617              Family Communication/Anticipated D/C date and plan/Code Status   DVT prophylaxis:  rivaroxaban (XARELTO) tablet 20 mg     Code Status: Full Code  Family Communication: None Disposition Plan: Plan to discharge to SNF   Status is: Inpatient Remains inpatient appropriate because: CHF exacerbation       Subjective:   He has no complaints.  No shortness of breath or chest pain.  Objective:    Vitals:   03/31/23 0022 03/31/23 0415 03/31/23 1050 03/31/23 1203  BP: (!) 101/57 116/66  113/79  Pulse: 87 82  72  Resp: 20 (!) 22  (!) 27  Temp: 98.2 F (36.8 C) 98.2 F (36.8 C)  97.7 F (36.5 C)  TempSrc: Oral Oral  Oral  SpO2: 96% 100% 91% 92%  Weight:      Height:       No data found.   Intake/Output Summary (Last 24 hours) at 03/31/2023 1216 Last data filed at 03/30/2023 2246 Gross per 24 hour  Intake 240 ml  Output 350 ml  Net -110 ml   Filed Weights   03/28/23 0408 03/29/23 0500 03/30/23 0500  Weight: (!) 164 kg (!) 163.7 kg (!) 157.5 kg    Exam:   GEN: NAD SKIN: Warm and dry EYES: No pallor or icterus ENT: MMM CV: RRR PULM: CTA B ABD:  soft, obese, NT, +BS CNS: AAO x 3, non focal EXT: No edema or tenderness        Data Reviewed:   I have personally reviewed following labs and imaging studies:  Labs: Labs show the following:   Basic Metabolic Panel: Recent Labs  Lab 03/26/23 0534 03/26/23 1726 03/27/23 0358 03/27/23 1707 03/28/23 0504 03/29/23 0828 03/30/23 0631 03/31/23 0639  NA 141   < > 143 146* 143 142 143 138  K 4.1   < > 4.3 4.0 4.1 4.3 4.3 3.5  CL 103   < > 102 106 102 103 104 99  CO2 27   < > 30 29 28 28 29 29   GLUCOSE 127*   < > 151* 141* 151* 138* 108* 110*  BUN 37*   < > 43* 38* 41* 45* 43* 48*  CREATININE 1.64*   < > 1.59* 1.46* 1.53* 1.43* 1.42* 1.52*  CALCIUM 8.1*   < > 8.9 8.5* 9.1 9.8 9.2 9.0  MG 2.4  --  2.8* 2.5* 2.8*  --   --   --   PHOS  --   --   --   --  2.8  --   --   --    < > = values in this interval not displayed.   GFR Estimated Creatinine Clearance: 84.2 mL/min (A) (by C-G formula based on SCr of 1.52 mg/dL (H)). Liver Function Tests: No results for input(s): "AST", "ALT", "ALKPHOS", "BILITOT", "PROT", "ALBUMIN" in the last 168 hours.  No results for input(s): "LIPASE", "AMYLASE" in the last 168 hours. No results for input(s): "AMMONIA" in the last 168 hours. Coagulation profile Recent Labs  Lab 03/27/23 0931  INR 1.1    CBC: Recent Labs  Lab 03/25/23 0426 03/26/23 0422 03/27/23 0358 03/28/23 0340 03/29/23 0521  WBC 4.3 6.1 6.7 9.4 6.5  HGB 18.2* 16.8 17.9* 19.3* 19.4*  HCT 56.7* 53.9* 58.4* 62.7* 63.5*  MCV 92.3 92.5 94.7 94.3 94.8  PLT 168  157 145* 153 143*   Cardiac Enzymes: No results for input(s): "CKTOTAL", "CKMB", "CKMBINDEX", "TROPONINI" in the last 168 hours. BNP (last 3 results) No results for input(s): "PROBNP" in the last 8760 hours. CBG: Recent Labs  Lab 03/30/23 1202 03/30/23 1553 03/30/23 2003 03/30/23 2356 03/31/23 0529  GLUCAP 215* 172* 129* 124* 139*   D-Dimer: No results for input(s): "DDIMER" in the last 72 hours. Hgb  A1c: No results for input(s): "HGBA1C" in the last 72 hours. Lipid Profile: No results for input(s): "CHOL", "HDL", "LDLCALC", "TRIG", "CHOLHDL", "LDLDIRECT" in the last 72 hours. Thyroid function studies: No results for input(s): "TSH", "T4TOTAL", "T3FREE", "THYROIDAB" in the last 72 hours.  Invalid input(s): "FREET3" Anemia work up: No results for input(s): "VITAMINB12", "FOLATE", "FERRITIN", "TIBC", "IRON", "RETICCTPCT" in the last 72 hours. Sepsis Labs: Recent Labs  Lab 03/26/23 0422 03/27/23 0358 03/28/23 0340 03/29/23 0521  WBC 6.1 6.7 9.4 6.5    Microbiology Recent Results (from the past 240 hours)  Culture, Respiratory w Gram Stain     Status: None   Collection Time: 03/21/23  7:30 PM   Specimen: Tracheal Aspirate; Respiratory  Result Value Ref Range Status   Specimen Description   Final    TRACHEAL ASPIRATE Performed at [facility], [address], [place], [place] [address]    Special Requests   Final    NONE Performed at [facility], [address] [place], [place] [address]    Gram Stain   Final    FEW WBC PRESENT,BOTH PMN AND MONONUCLEAR FEW GRAM POSITIVE COCCI IN PAIRS AND CHAINS    Culture   Final    RARE Normal respiratory flora-no Staph aureus or Pseudomonas seen Performed at [facility], [address], [place], [place] [address]    Report Status 03/24/2023 FINAL  Final  Culture, Respiratory w Gram Stain     Status: None   Collection Time: 03/26/23  7:25 AM   Specimen: Tracheal Aspirate; Respiratory  Result Value Ref Range Status   Specimen Description   Final    TRACHEAL ASPIRATE Performed at [facility], [address] [place], [place] [address]    Special Requests   Final    NONE Performed at [facility], [address] [place], [place] [address]    Gram Stain   Final    ABUNDANT WBC PRESENT, PREDOMINANTLY PMN NO ORGANISMS SEEN    Culture   Final    RARE METHICILLIN RESISTANT  STAPHYLOCOCCUS AUREUS FEW CORYNEBACTERIUM STRIATUM Standardized susceptibility testing for this organism is not available. Performed at [facility], [address], [place], [place] [address]    Report Status 03/29/2023 FINAL  Final   Organism ID, Bacteria METHICILLIN RESISTANT STAPHYLOCOCCUS AUREUS  Final      Susceptibility   Methicillin resistant staphylococcus aureus - MIC*    CIPROFLOXACIN >=8 RESISTANT Resistant     ERYTHROMYCIN >=8 RESISTANT Resistant     GENTAMICIN <=0.5 SENSITIVE Sensitive     OXACILLIN >=4 RESISTANT Resistant     TETRACYCLINE <=1 SENSITIVE Sensitive     VANCOMYCIN <=0.5 SENSITIVE Sensitive     TRIMETH/SULFA >=320 RESISTANT Resistant     CLINDAMYCIN <=0.25 SENSITIVE Sensitive     RIFAMPIN <=0.5 SENSITIVE Sensitive     Inducible Clindamycin NEGATIVE Sensitive     LINEZOLID 4 SENSITIVE Sensitive     * RARE METHICILLIN RESISTANT STAPHYLOCOCCUS AUREUS  Expectorated Sputum Assessment w Gram Stain, Rflx to Resp Cult  Status: None   Collection Time: 03/27/23  8:36 AM   Specimen: Sputum  Result Value Ref Range Status   Specimen Description SPUTUM  Final   Special Requests  TASP  Final   Sputum evaluation   Final    THIS SPECIMEN IS ACCEPTABLE FOR SPUTUM CULTURE Performed at [facility], [address], [place], [place] [address]    Report Status 03/27/2023 FINAL  Final  Culture, Respiratory w Gram Stain     Status: None   Collection Time: 03/27/23  8:36 AM   Specimen: SPU  Result Value Ref Range Status   Specimen Description   Final    SPUTUM Performed at [facility], [address], [place], [place] [address]    Special Requests   Final     TASP Reflexed from [phone] Performed at [facility], [address] [place], [place] [address]    Gram Stain   Final    FEW WBC PRESENT, PREDOMINANTLY PMN NO ORGANISMS SEEN    Culture   Final    Normal respiratory flora-no Staph aureus or Pseudomonas seen Performed at  [facility], [address], [place], [place] [address]    Report Status 03/30/2023 FINAL  Final  Culture, blood (Routine X 2) w Reflex to ID Panel     Status: None (Preliminary result)   Collection Time: 03/27/23 12:53 PM   Specimen: BLOOD  Result Value Ref Range Status   Specimen Description BLOOD BLOOD LEFT HAND  Final   Special Requests   Final    BOTTLES DRAWN AEROBIC AND ANAEROBIC Blood Culture adequate volume   Culture   Final    NO GROWTH 4 DAYS Performed at [facility], [address] [place], [place] [address]    Report Status PENDING  Incomplete  Culture, blood (Routine X 2) w Reflex to ID Panel     Status: None (Preliminary result)   Collection Time: 03/27/23 12:59 PM   Specimen: BLOOD  Result Value Ref Range Status   Specimen Description BLOOD BLOOD RIGHT HAND  Final   Special Requests   Final    BOTTLES DRAWN AEROBIC AND ANAEROBIC Blood Culture results may not be optimal due to an inadequate volume of blood received in culture bottles   Culture   Final    NO GROWTH 4 DAYS Performed at [facility], [address] [place], [place] [address]    Report Status PENDING  Incomplete    Procedures and diagnostic studies:  No results found.              LOS: 11 days   Harden Bramer  [employer] loss adjuster on [email or web address]. If 7PM-7AM, please contact night-coverage at [email or web address]     03/31/2023, 12:16 PM

## 2023-03-31 NOTE — Progress Notes (Signed)
Physical Therapy Treatment Patient Details Name: Gregory Mckenzie MRN: [id number] DOB: 08/15/1975 Today's Date: 03/31/2023   History of Present Illness Pt is a 48 y/o M admitted on 03/20/23 after presenting from the heart failure clinic with c/c of acute respiratory distress. Pt required intubation & mechanical ventilation, extubated 03/28/23. Pt is being treated for acute on chronic hypoxic hypercapneic respiratory failure 2/2 CHF exacerbation in the setting of HFmrEF. PMH: chronic combined systolic & diastolic CHF, a-fib/flutter, DM2, HLD, HTN, OSA not on CPAP, obesity hypoventilation syndrome, former smoker, polycythemia    PT Comments  Patient received standing in room with bed alarm sounding, all rails on bed up, underwear around one foot. When asked about what he is doing/how he got out of bed patient reports he has not been in the bed. Patient seems to have poor safety awareness. He is able to ambulate ~60 feet with RW and supervision. Mobility is relatively good, just decreased safety noted. He will continue to benefit from skilled PT to improve safety and independence.      If plan is discharge home, recommend the following: A little help with walking and/or transfers;A little help with bathing/dressing/bathroom;Assist for transportation;Help with stairs or ramp for entrance;Assistance with cooking/housework;Direct supervision/assist for medications management   Can travel by private vehicle     Yes  Equipment Recommendations  Rolling walker (2 wheels)    Recommendations for Other Services       Precautions / Restrictions Precautions Precautions: Fall Recall of Precautions/Restrictions: Impaired Restrictions Weight Bearing Restrictions Per Provider Order: No     Mobility  Bed Mobility               General bed mobility comments: Patient received standing at edge of bed with all rails up bed alarm going off.    Transfers Overall transfer level: Modified  independent Equipment used: Rolling walker (2 wheels) Transfers: Sit to/from Stand Sit to Stand: Modified independent (Device/Increase time)                Ambulation/Gait Ambulation/Gait assistance: Supervision Gait Distance (Feet): 60 Feet Assistive device: Rolling walker (2 wheels) Gait Pattern/deviations: Step-through pattern, Decreased step length - right, Decreased step length - left, Decreased stride length Gait velocity: decr     General Gait Details: patient ambulated out to hallway with RW and supervision   Stairs             Wheelchair Mobility     Tilt Bed    Modified Rankin (Stroke Patients Only)       Balance Overall balance assessment: Mild deficits observed, not formally tested Sitting-balance support: Feet supported Sitting balance-Leahy Scale: Normal     Standing balance support: Bilateral upper extremity supported, During functional activity, Reliant on assistive device for balance Standing balance-Leahy Scale: Good Standing balance comment: good balance with mobility                            Communication Communication Communication: Impaired Factors Affecting Communication: Reduced clarity of speech  Cognition Arousal: Alert Behavior During Therapy: WFL for tasks assessed/performed   PT - Cognitive impairments: No family/caregiver present to determine baseline Difficult to assess due to: Impaired communication                     PT - Cognition Comments: Patient with decreased safety awareness. Standing in room with bed alarm going off. States he has not been in the bed. Following  commands: Intact      Cueing Cueing Techniques: Verbal cues  Exercises      General Comments        Pertinent Vitals/Pain Pain Assessment Pain Assessment: No/denies pain    Home Living                          Prior Function            PT Goals (current goals can now be found in the care plan section)  Acute Rehab PT Goals Patient Stated Goal: get better, go home PT Goal Formulation: With patient Time For Goal Achievement: 04/11/23 Potential to Achieve Goals: Fair Progress towards PT goals: Progressing toward goals    Frequency    Min 1X/week      PT Plan      Co-evaluation              AM-PAC PT "6 Clicks" Mobility   Outcome Measure  Help needed turning from your back to your side while in a flat bed without using bedrails?: None Help needed moving from lying on your back to sitting on the side of a flat bed without using bedrails?: None Help needed moving to and from a bed to a chair (including a wheelchair)?: A Little Help needed standing up from a chair using your arms (e.g., wheelchair or bedside chair)?: A Little Help needed to walk in hospital room?: A Little Help needed climbing 3-5 steps with a railing? : A Lot 6 Click Score: 19    End of Session Equipment Utilized During Treatment: Oxygen Activity Tolerance: Patient tolerated treatment well Patient left: in chair;with call bell/phone within reach;with chair alarm set Nurse Communication: Mobility status PT Visit Diagnosis: Difficulty in walking, not elsewhere classified (R26.2);Other (comment) (cognitive deficits)     Time: 1610-9604 PT Time Calculation (min) (ACUTE ONLY): 19 min  Charges:    $Gait Training: 8-22 mins PT General Charges $$ ACUTE PT VISIT: 1 Visit                     Kaidan Spengler, PT, GCS 03/31/23,10:57 AM

## 2023-03-31 NOTE — Progress Notes (Signed)
   AHF team will sign off post-cath.   We will arrange for outpatient f/u.  Arvilla Meres, MD  4:20 PM

## 2023-03-31 NOTE — Progress Notes (Signed)
Patient ID: Gregory Mckenzie, male   DOB: 02-27-1975, 48 y.o.   MRN: [id number]     ADVANCED HF ROUNDINGNOTE   Cardiologist: Yvonne Kendall, MD  Chief Complaint: Heart Failure  Subjective:    Diuresed well with diamox. Feels great. Eager to go home. Denies CP or SOB.   Objective:   Weight Range: (!) 157.5 kg Body mass index is 56.04 kg/m.   Vital Signs:   Temp:  [97.7 F (36.5 C)-98.2 F (36.8 C)] 97.7 F (36.5 C) (02/14 1203) Pulse Rate:  [46-87] 72 (02/14 1203) Resp:  [20-27] 27 (02/14 1203) BP: (101-129)/(57-96) 113/79 (02/14 1203) SpO2:  [91 %-100 %] 92 % (02/14 1203) Last BM Date : 03/31/23  Weight change: Filed Weights   03/28/23 0408 03/29/23 0500 03/30/23 0500  Weight: (!) 164 kg (!) 163.7 kg (!) 157.5 kg   Intake/Output:   Intake/Output Summary (Last 24 hours) at 03/31/2023 1224 Last data filed at 03/30/2023 2246 Gross per 24 hour  Intake 240 ml  Output 350 ml  Net -110 ml    Physical Exam   General:  Ambulating room. No resp difficulty HEENT: normal Neck: supple.JVP hard to see . Carotids 2+ bilat; no bruits. No lymphadenopathy or thryomegaly appreciated. Cor: PMI nondisplaced. Regular rate & rhythm. 2/6 TR Lungs: clear Abdomen: obese soft, nontender, nondistended. No hepatosplenomegaly. No bruits or masses. Good bowel sounds. Extremities: no cyanosis, clubbing, rash, edema Neuro: alert & orientedx3, cranial nerves grossly intact. moves all 4 extremities w/o difficulty. Affect pleasant   Tele    NSR 70-80s Personally reviewed  Labs    CBC Recent Labs    03/29/23 0521  WBC 6.5  HGB 19.4*  HCT 63.5*  MCV 94.8  PLT 143*   Basic Metabolic Panel Recent Labs    40/98/11 0631 03/31/23 0639  NA 143 138  K 4.3 3.5  CL 104 99  CO2 29 29  GLUCOSE 108* 110*  BUN 43* 48*  CREATININE 1.42* 1.52*  CALCIUM 9.2 9.0   Liver Function Tests No results for input(s): "AST", "ALT", "ALKPHOS", "BILITOT", "PROT", "ALBUMIN" in the last 72  hours.  No results for input(s): "LIPASE", "AMYLASE" in the last 72 hours. Cardiac Enzymes No results for input(s): "CKTOTAL", "CKMB", "CKMBINDEX", "TROPONINI" in the last 72 hours.  BNP: BNP (last 3 results) Recent Labs    03/07/23 1207 03/09/23 0957 03/20/23 1046  BNP 2,259.3* 1,857.9* 1,596.6*    ProBNP (last 3 results) No results for input(s): "PROBNP" in the last 8760 hours.   D-Dimer No results for input(s): "DDIMER" in the last 72 hours. Hemoglobin A1C No results for input(s): "HGBA1C" in the last 72 hours. Fasting Lipid Panel No results for input(s): "CHOL", "HDL", "LDLCALC", "TRIG", "CHOLHDL", "LDLDIRECT" in the last 72 hours.  Thyroid Function Tests No results for input(s): "TSH", "T4TOTAL", "T3FREE", "THYROIDAB" in the last 72 hours.  Invalid input(s): "FREET3"  Other results:   Imaging    No results found.    Medications:     Scheduled Medications:  [MAR Hold] atorvastatin  80 mg Oral QHS   [MAR Hold] carvedilol  6.25 mg Oral BID WC   [MAR Hold] Chlorhexidine Gluconate Cloth  6 each Topical Daily   [MAR Hold] docusate sodium  100 mg Oral BID   [MAR Hold] empagliflozin  10 mg Oral Daily   [MAR Hold] feeding supplement  237 mL Oral TID BM   [MAR Hold] insulin aspart  2-6 Units Subcutaneous Q4H   [MAR Hold] multivitamin with minerals  1 tablet Oral Daily   [MAR Hold] polyethylene glycol  17 g Oral Daily   [MAR Hold] rivaroxaban  20 mg Oral Q breakfast   [MAR Hold] sacubitril-valsartan  1 tablet Oral BID   [MAR Hold] sennosides  10 mL Oral QHS   [MAR Hold] sodium chloride flush  10-40 mL Intracatheter Q12H   [MAR Hold] spironolactone  12.5 mg Oral Daily   [MAR Hold] torsemide  60 mg Oral Daily    Infusions:  [START ON 04/01/2023] sodium chloride 10 mL/hr at 03/31/23 1205     PRN Medications: [MAR Hold] acetaminophen, [MAR Hold] albuterol, [MAR Hold] bisacodyl, [MAR Hold] ondansetron **OR** [MAR Hold] ondansetron (ZOFRAN) IV, [MAR Hold] mouth  rinse, [MAR Hold] sodium chloride flush    Patient Profile  Gregory Mckenzie is 55 uyear old with history HFpEF, DMII, HLD, OSA, HTN, PAF, chronic hypoxic respiratory failure on 2-4 liters, and polycythemia. 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 Family history of HTN Dad/Brother. Brother also had PE.   Admitted with A/C Hypoxic Respiratory Failure and A/C HFpEF Assessment/Plan  1. Acute on chronic HF with mid-range EF: Significant RV dysfunction and likely significant pulmonary hypertension in the setting of OHS/OSA. Echo this admission showed EF 45-50%, severe LVH, moderate RV enlargement and moderate RV dysfunction, PASP 44 mmHg.  - Volume status appears optimized - Continue Torsemide 60 mg daily.  - SCr stable around his baseline.  - Continue Entresto 24-26 mg BID - Continue Jardiance 10 mg daily - Continue coreg 6.25 mg BID, would avoid titration for now with recent shock - Continue spironolactone 12.5 mg daily - Will plan RHC today to assess volume status and degree of PH  2. Hypotension/shock: Suspect this is due to combination of RV failure and sedation with intubation.   - resolved  3. Acute hypoxemic/hypercarbic respiratory failure: In setting of OHS/OSA (on home oxygen 3-4 L, has not had CPAP for 6 months) and CHF/volume overload.  Required intubation.  - Much improved with diuresis - suspect advanced OSA/OHS - Will need Pulmonary f/u to resume BIPAP ASAP    4. CKD stage 3a: Creatinine at baseline - SCr baseline (1.4-1.6).   - Stable 1.5 today  5. Polycythemia: Likely due to chronic hypoxemia.   6. Elevated troponin: Mild elevation, no trend.  - Had cath in 2022 with minimal nonobstructive CAD.  - Continue atorvastatin 80 mg daily  7. Atrial fibrillation:  -this is paroxysmal  -Currently maintaining normal sinus rhythm on telemetry -Continue Xarelto. No bleeding   8. DM2: SSI.   9. ID - blood cultures 2/10 NGTD - TMax 103 2/11. Remains afebrile now - central line and  foley removed.  - Abx course completed 2/11  Plan RHC today. Likely stable for d/c after RHC from our standpoint with close f/u in HF Clinic and Pulmonary.    Arvilla Meres MD 03/31/2023 12:24 PM

## 2023-03-31 NOTE — Progress Notes (Signed)
SLP Cancellation Note  Patient Details Name: Gregory Mckenzie MRN: [id number] DOB: [birth year]   Cancelled treatment:       Reason Eval/Treat Not Completed: Patient at procedure or test/unavailable  Pt has been NPO for procedure today; now leaving for procedure.  ST services will f/u w/ toleration of upgraded diet consistency tomorrow. NSG updated, agreed.  OF NOTE: NSG reported that pt had N/V post Lunch meal yesterday -- similar episode post his Breakfast meal w/ PT in room.      Jerilynn Som, MS, CCC-SLP Speech Language Pathologist Rehab Services; [facility] [phone] (ascom) Wilburt Messina 03/31/2023, 12:09 PM

## 2023-03-31 NOTE — Progress Notes (Signed)
OT Cancellation Note  Patient Details Name: Orpheus Hayhurst MRN: [id number] DOB: 1975/11/03   Cancelled Treatment:    Reason Eval/Treat Not Completed: Patient at procedure or test/ unavailable. Pt out of the room. Per chart, at procedure. Will re-attempt OT tx at later date/time as able.  Arman Filter., MPH, MS, OTR/L ascom [phone] 03/31/23, 12:11 PM

## 2023-03-31 NOTE — Plan of Care (Signed)
  Problem: Education: Goal: Ability to describe self-care measures that may prevent or decrease complications (Diabetes Survival Skills Education) will improve Outcome: Progressing   Problem: Nutritional: Goal: Maintenance of adequate nutrition will improve Outcome: Progressing Goal: Progress toward achieving an optimal weight will improve Outcome: Progressing   Problem: Clinical Measurements: Goal: Ability to maintain clinical measurements within normal limits will improve Outcome: Progressing

## 2023-03-31 NOTE — Interval H&P Note (Signed)
History and Physical Interval Note:  03/31/2023 12:29 PM  Gregory Mckenzie  has presented today for surgery, with the diagnosis of heart failure with mid-range ejection fraction.  The various methods of treatment have been discussed with the patient and family. After consideration of risks, benefits and other options for treatment, the patient has consented to  Procedure(s) with comments: RIGHT HEART CATH (N/A) - Heart Failure with mid-range ejection fraction as a surgical intervention.  The patient's history has been reviewed, patient examined, no change in status, stable for surgery.  I have reviewed the patient's chart and labs.  Questions were answered to the patient's satisfaction.     [name]

## 2023-04-01 DIAGNOSIS — I5043 Acute on chronic combined systolic (congestive) and diastolic (congestive) heart failure: Secondary | ICD-10-CM | POA: Diagnosis not present

## 2023-04-01 LAB — CULTURE, BLOOD (ROUTINE X 2)
Culture: NO GROWTH
Culture: NO GROWTH
Special Requests: ADEQUATE

## 2023-04-01 LAB — BASIC METABOLIC PANEL
Anion gap: 10 (ref 5–15)
BUN: 47 mg/dL — ABNORMAL HIGH (ref 6–20)
CO2: 29 mmol/L (ref 22–32)
Calcium: 9.1 mg/dL (ref 8.9–10.3)
Chloride: 100 mmol/L (ref 98–111)
Creatinine, Ser: 1.44 mg/dL — ABNORMAL HIGH (ref 0.61–1.24)
GFR, Estimated: 60 mL/min — ABNORMAL LOW (ref >60)
Glucose, Bld: 95 mg/dL (ref 70–99)
Potassium: 3.7 mmol/L (ref 3.5–5.1)
Sodium: 139 mmol/L (ref 135–145)

## 2023-04-01 LAB — BLOOD GAS, ARTERIAL
Acid-Base Excess: 8 mmol/L — ABNORMAL HIGH (ref 0.0–2.0)
Bicarbonate: 35.9 mmol/L — ABNORMAL HIGH (ref 20.0–28.0)
O2 Content: 5 L/min
O2 Saturation: 95.6 %
Patient temperature: 37
pCO2 arterial: 58 mm[Hg] — ABNORMAL HIGH (ref 32–48)
pH, Arterial: 7.4 (ref 7.35–7.45)
pO2, Arterial: 66 mm[Hg] — ABNORMAL LOW (ref 83–108)

## 2023-04-01 LAB — GLUCOSE, CAPILLARY
Glucose-Capillary: 128 mg/dL — ABNORMAL HIGH (ref 70–99)
Glucose-Capillary: 237 mg/dL — ABNORMAL HIGH (ref 70–99)
Glucose-Capillary: 131 mg/dL — ABNORMAL HIGH (ref 70–99)
Glucose-Capillary: 161 mg/dL — ABNORMAL HIGH (ref 70–99)

## 2023-04-01 NOTE — Plan of Care (Signed)
   Problem: Education: Goal: Ability to describe self-care measures that may prevent or decrease complications (Diabetes Survival Skills Education) will improve Outcome: Progressing

## 2023-04-01 NOTE — Plan of Care (Signed)
  Problem: Education: Goal: Ability to describe self-care measures that may prevent or decrease complications (Diabetes Survival Skills Education) will improve Outcome: Progressing Goal: Individualized Educational Video(s) Outcome: Progressing   Problem: Coping: Goal: Ability to adjust to condition or change in health will improve Outcome: Progressing   Problem: Fluid Volume: Goal: Ability to maintain a balanced intake and output will improve Outcome: Progressing   Problem: Health Behavior/Discharge Planning: Goal: Ability to identify and utilize available resources and services will improve Outcome: Progressing Goal: Ability to manage health-related needs will improve Outcome: Progressing   Problem: Metabolic: Goal: Ability to maintain appropriate glucose levels will improve Outcome: Progressing   Problem: Nutritional: Goal: Maintenance of adequate nutrition will improve Outcome: Progressing Goal: Progress toward achieving an optimal weight will improve Outcome: Progressing   Problem: Skin Integrity: Goal: Risk for impaired skin integrity will decrease Outcome: Progressing   Problem: Tissue Perfusion: Goal: Adequacy of tissue perfusion will improve Outcome: Progressing   Problem: Education: Goal: Knowledge of General Education information will improve Description: Including pain rating scale, medication(s)/side effects and non-pharmacologic comfort measures Outcome: Progressing   Problem: Health Behavior/Discharge Planning: Goal: Ability to manage health-related needs will improve Outcome: Progressing   Problem: Clinical Measurements: Goal: Ability to maintain clinical measurements within normal limits will improve Outcome: Progressing Goal: Will remain free from infection Outcome: Progressing Goal: Diagnostic test results will improve Outcome: Progressing Goal: Respiratory complications will improve Outcome: Progressing Goal: Cardiovascular complication will  be avoided Outcome: Progressing   Problem: Activity: Goal: Risk for activity intolerance will decrease Outcome: Progressing   Problem: Nutrition: Goal: Adequate nutrition will be maintained Outcome: Progressing   Problem: Coping: Goal: Level of anxiety will decrease Outcome: Progressing   Problem: Elimination: Goal: Will not experience complications related to bowel motility Outcome: Progressing Goal: Will not experience complications related to urinary retention Outcome: Progressing   Problem: Pain Managment: Goal: General experience of comfort will improve and/or be controlled Outcome: Progressing   Problem: Safety: Goal: Ability to remain free from injury will improve Outcome: Progressing   Problem: Skin Integrity: Goal: Risk for impaired skin integrity will decrease Outcome: Progressing   Problem: Education: Goal: Ability to demonstrate management of disease process will improve Outcome: Progressing Goal: Ability to verbalize understanding of medication therapies will improve Outcome: Progressing Goal: Individualized Educational Video(s) Outcome: Progressing   Problem: Activity: Goal: Capacity to carry out activities will improve Outcome: Progressing   Problem: Cardiac: Goal: Ability to achieve and maintain adequate cardiopulmonary perfusion will improve Outcome: Progressing   Problem: Education: Goal: Knowledge of disease or condition will improve Outcome: Progressing Goal: Knowledge of the prescribed therapeutic regimen will improve Outcome: Progressing Goal: Individualized Educational Video(s) Outcome: Progressing   Problem: Activity: Goal: Ability to tolerate increased activity will improve Outcome: Progressing   Problem: Respiratory: Goal: Ability to maintain a clear airway will improve Outcome: Progressing Goal: Levels of oxygenation will improve Outcome: Progressing Goal: Ability to maintain adequate ventilation will improve Outcome:  Progressing

## 2023-04-01 NOTE — Progress Notes (Signed)
 Late entry: approx 0330, asked patient if he wanted to be placed back on bipap, patient states he has worn it all day and did not want to wear it anymore tonight.

## 2023-04-01 NOTE — Progress Notes (Addendum)
 Progress Note    Gregory Mckenzie  ZHY:[id number] DOB: 06-Jan-1976  DOA: 03/20/2023 PCP: Pcp, No      Brief Narrative:    Medical records reviewed and are as summarized below:  Gregory Mckenzie is a 48 y.o. male significant PMH of chronic combined systolic and diastolic CHF, atrial fibrillation/flutter, T2DM, HLD, HTN, OSA not on CPAP, obesity hypoventilation syndrome, former smoker, morbid obesity, polycythemia, who was referred from the heart failure clinic because of acute respiratory distress.  Reportedly, he was seen in the heart failure clinic for exertional shortness of breath, fatigue, worsening abdominal distention, increasing pedal edema weight gain and cough.  He had run out of his oxygen the night prior to admission.  He was noted to have oxygen saturation 67% on room air at the CHF clinic.  He was subsequently referred to the ED for further management.    Significant Hospital Events: Including procedures, antibiotic start and stop dates in addition to other pertinent events   2/3: Admitted to hospitalist service with acute on chronic hypoxic hypercapnic respiratory failure in the setting of CHF exacerbation. Failed BiPAP requiring intubation.  PCCM consulted 2/4: remains intubated, on diuresis, PICC line placed 2/5: aggressively diuresed, improved oxygen requirements 2/6: remains intubated, Co-ox 67% this AM. Kidney function improved 2/7: intubated, follows commands, I/O -22L, kidney function improved 2/8: creatinine bumped, follows commands off sedation, I/O -23L 2/9 remains on vent, I/O -26L.  02/11 - Extubated to [facility]. Continues to diurese well.    Patient was transferred to the hospitalist service 03/30/2023. Chart reviewed.   Assessment/Plan:   Principal Problem:   CHF exacerbation (HCC) Active Problems:   Acute respiratory failure with hypoxia and hypercapnia (HCC)   NSTEMI (non-ST elevated myocardial infarction) (HCC)   Acute kidney injury  superimposed on chronic kidney disease (HCC)   Type 2 diabetes mellitus with complication, without long-term current use of insulin (HCC)   Class 3 severe obesity due to excess calories with serious comorbidity and body mass index (BMI) of 45.0 to 49.9 in adult [facility])   Paroxysmal atrial flutter (HCC)   Acute on chronic diastolic heart failure (HCC)   CKD stage 3a, GFR 45-59 ml/min (HCC)   RVF (right ventricular failure) (HCC)   Cardiogenic shock (HCC)   PAF (paroxysmal atrial fibrillation) (HCC)   Acute hypoxic respiratory failure (HCC)   Nutrition Problem: Inadequate oral intake Etiology: inability to eat (pt sedated and ventilated)  Signs/Symptoms: NPO status   Body mass index is 56.04 kg/m.  (Morbid obesity)   Acute on chronic hypoxic and hypercapnic respiratory failure: Improved.  He is tolerating 4 L/min oxygen which is his baseline.  ABG on 04/01/2023 showed pH 7.4, pCO2 58, pO2 66, O2 sat 95.6% on 5 L of oxygen. Start BiPAP nightly. Patient will qualify for NIPPV on BiPAP at home. Consult has been placed to transition of care team to help secure BiPAP for use at home or nursing home. S/p intubation on 03/20/2023 and extubated on 03/28/2023.   Acute exacerbation of chronic heart failure with midrange ejection fraction, significant right ventricular dysfunction and probable significant pulmonary hypertension: Current regimen-torsemide, Entresto, Coreg, Jardiance and spironolactone.   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 2D echo showed EF estimated at 45 to 50%, severe LVH, moderate RV enlargement and moderate RV dysfunction, PASP 44 mmHg.   Paroxysmal atrial fibrillation: Continue Xarelto and carvedilol. S/p treatment with  IV heparin drip   Hypotension/shock: BP is improved. S/p treatment with IV Levophed infusion   Persistent fevers: Resolved.  S/p  treatment with IV antibiotics.  No growth on blood cultures.   Obesity hypoventilation syndrome/obstructive sleep apnea: Reportedly, he has not used CPAP for 6 months.  Patient advised to go back on CPAP nightly at home.   CKD stage IIIa: Creatinine is stable   Elevated troponin: Likely from demand ischemia   Polycythemia: Likely secondary polycythemia from chronic hypoxic respiratory failure   BP on the low side today so hold carvedilol and Entresto for now.    Diet Order             DIET DYS 3 Room service appropriate? Yes with Assist; Fluid consistency: Thin  Diet effective now                            Consultants: Cardiologist Intensivist  Procedures: Intubation and mechanical ventilation    Medications:    atorvastatin  80 mg Oral QHS   carvedilol  6.25 mg Oral BID WC   Chlorhexidine Gluconate Cloth  6 each Topical Daily   docusate sodium  100 mg Oral BID   empagliflozin  10 mg Oral Daily   feeding supplement  237 mL Oral TID BM   insulin aspart  2-6 Units Subcutaneous Q4H   multivitamin with minerals  1 tablet Oral Daily   polyethylene glycol  17 g Oral Daily   rivaroxaban  20 mg Oral Q breakfast   sacubitril-valsartan  1 tablet Oral BID   sennosides  10 mL Oral QHS   sodium chloride flush  10-40 mL Intracatheter Q12H   spironolactone  12.5 mg Oral Daily   torsemide  60 mg Oral Daily   Continuous Infusions:     Anti-infectives (From admission, onward)    Start     Dose/Rate Route Frequency Ordered Stop   03/27/23 1000  vancomycin (VANCOREADY) IVPB 1500 mg/300 mL  Status:  Discontinued        1,500 mg 150 mL/hr over 120 Minutes Intravenous Every 24 hours 03/26/23 1101 03/28/23 1009   03/26/23 1300  vancomycin (VANCOREADY) IVPB 2000 mg/400 mL        2,000 mg 200 mL/hr over 120 Minutes Intravenous  Once 03/26/23 1101 03/26/23 1604   03/26/23 1200  ceFEPIme (MAXIPIME) 2 g in sodium chloride 0.9 % 100 mL IVPB  Status:  Discontinued         2 g 200 mL/hr over 30 Minutes Intravenous Every 8 hours 03/26/23 1054 03/28/23 1009   03/22/23 1000  cefTRIAXone (ROCEPHIN) 2 g in sodium chloride 0.9 % 100 mL IVPB        2 g 200 mL/hr over 30 Minutes Intravenous Every 24 hours 03/22/23 0750 03/26/23 0956   03/21/23 1430  azithromycin (ZITHROMAX) tablet 500 mg  Status:  Discontinued       Placed in "Followed by" Linked Group   500 mg Oral Daily 03/20/23 1418 03/21/23 0837   03/21/23 1200  azithromycin (ZITHROMAX) 500 mg in sodium chloride 0.9 % 250 mL IVPB  Status:  Discontinued        500 mg 250 mL/hr over 60 Minutes Intravenous Every 24 hours 03/21/23 0837 03/21/23 1600   03/20/23 1430  azithromycin (ZITHROMAX) 500 mg in sodium chloride 0.9 % 250 mL IVPB       Placed in "Followed by" Linked Group   500 mg 250 mL/hr  over 60 Minutes Intravenous Every 24 hours 03/20/23 1418 03/20/23 1617              Family Communication/Anticipated D/C date and plan/Code Status   DVT prophylaxis:  rivaroxaban (XARELTO) tablet 20 mg     Code Status: Full Code  Family Communication: None Disposition Plan: Plan to discharge to SNF   Status is: Inpatient Remains inpatient appropriate because: CHF exacerbation       Subjective:   Interval events noted.  No shortness of breath or chest pain.  He said he used BiPAP yesterday afternoon and last night.  Morrie Sheldon, RN, was at the bedside  Objective:    Vitals:   04/01/23 0006 04/01/23 0230 04/01/23 0343 04/01/23 1034  BP:   115/60 98/65  Pulse: 76  (!) 41 92  Resp: (!) 42 (!) 26    Temp:   97.8 F (36.6 C) 98.5 F (36.9 C)  TempSrc:   Oral Oral  SpO2: 94%  93% 92%  Weight:      Height:       No data found.   Intake/Output Summary (Last 24 hours) at 04/01/2023 1108 Last data filed at 04/01/2023 0900 Gross per 24 hour  Intake 459.67 ml  Output 700 ml  Net -240.33 ml   Filed Weights   03/28/23 0408 03/29/23 0500 03/30/23 0500  Weight: (!) 164 kg (!) 163.7 kg (!) 157.5  kg    Exam:   GEN: NAD SKIN: Warm and dry EYES: No pallor or icterus ENT: MMM CV: RRR PULM: CTA B ABD: soft, obese, NT, +BS CNS: AAO x 3, non focal EXT: No edema or tenderness       Data Reviewed:   I have personally reviewed following labs and imaging studies:  Labs: Labs show the following:   Basic Metabolic Panel: Recent Labs  Lab 03/26/23 0534 03/26/23 1726 03/27/23 0358 03/27/23 1707 03/28/23 0504 03/29/23 0828 03/30/23 0631 03/31/23 0639 03/31/23 1327 03/31/23 1329 04/01/23 0439  NA 141   < > 143 146* 143 142 143 138 147* 142 139  K 4.1   < > 4.3 4.0 4.1 4.3 4.3 3.5 2.6* 3.4* 3.7  CL 103   < > 102 106 102 103 104 99  --   --  100  CO2 27   < > 30 29 28 28 29 29   --   --  29  GLUCOSE 127*   < > 151* 141* 151* 138* 108* 110*  --   --  95  BUN 37*   < > 43* 38* 41* 45* 43* 48*  --   --  47*  CREATININE 1.64*   < > 1.59* 1.46* 1.53* 1.43* 1.42* 1.52*  --   --  1.44*  CALCIUM 8.1*   < > 8.9 8.5* 9.1 9.8 9.2 9.0  --   --  9.1  MG 2.4  --  2.8* 2.5* 2.8*  --   --   --   --   --   --   PHOS  --   --   --   --  2.8  --   --   --   --   --   --    < > = values in this interval not displayed.   GFR Estimated Creatinine Clearance: 88.9 mL/min (A) (by C-G formula based on SCr of 1.44 mg/dL (H)). Liver Function Tests: No results for input(s): "AST", "ALT", "ALKPHOS", "BILITOT", "PROT", "ALBUMIN" in the last 168 hours.  No results for input(s): "LIPASE", "AMYLASE" in the last 168 hours. No results for input(s): "AMMONIA" in the last 168 hours. Coagulation profile Recent Labs  Lab 03/27/23 0931  INR 1.1    CBC: Recent Labs  Lab 03/26/23 0422 03/27/23 0358 03/28/23 0340 03/29/23 0521 03/31/23 1327 03/31/23 1329  WBC 6.1 6.7 9.4 6.5  --   --   HGB 16.8 17.9* 19.3* 19.4* 19.7* 21.8*  HCT 53.9* 58.4* 62.7* 63.5* 58.0* 64.0*  MCV 92.5 94.7 94.3 94.8  --   --   PLT 157 145* 153 143*  --   --    Cardiac Enzymes: No results for input(s): "CKTOTAL",  "CKMB", "CKMBINDEX", "TROPONINI" in the last 168 hours. BNP (last 3 results) No results for input(s): "PROBNP" in the last 8760 hours. CBG: Recent Labs  Lab 03/30/23 2356 03/31/23 0529 03/31/23 1725 03/31/23 2112 04/01/23 0331  GLUCAP 124* 139* 125* 141* 128*   D-Dimer: No results for input(s): "DDIMER" in the last 72 hours. Hgb A1c: No results for input(s): "HGBA1C" in the last 72 hours. Lipid Profile: No results for input(s): "CHOL", "HDL", "LDLCALC", "TRIG", "CHOLHDL", "LDLDIRECT" in the last 72 hours. Thyroid function studies: No results for input(s): "TSH", "T4TOTAL", "T3FREE", "THYROIDAB" in the last 72 hours.  Invalid input(s): "FREET3" Anemia work up: No results for input(s): "VITAMINB12", "FOLATE", "FERRITIN", "TIBC", "IRON", "RETICCTPCT" in the last 72 hours. Sepsis Labs: Recent Labs  Lab 03/26/23 0422 03/27/23 0358 03/28/23 0340 03/29/23 0521  WBC 6.1 6.7 9.4 6.5    Microbiology Recent Results (from the past 240 hours)  Culture, Respiratory w Gram Stain     Status: None   Collection Time: 03/26/23  7:25 AM   Specimen: Tracheal Aspirate; Respiratory  Result Value Ref Range Status   Specimen Description   Final    TRACHEAL ASPIRATE Performed at [facility], [address] [place], [place] [address]    Special Requests   Final    NONE Performed at [facility], [address] [place], [place] [address]    Gram Stain   Final    ABUNDANT WBC PRESENT, PREDOMINANTLY PMN NO ORGANISMS SEEN    Culture   Final    RARE METHICILLIN RESISTANT STAPHYLOCOCCUS AUREUS FEW CORYNEBACTERIUM STRIATUM Standardized susceptibility testing for this organism is not available. Performed at [facility], [address] [place], [place] [address]    Report Status 03/29/2023 FINAL  Final   Organism ID, Bacteria METHICILLIN RESISTANT STAPHYLOCOCCUS AUREUS  Final      Susceptibility   Methicillin resistant staphylococcus aureus - MIC*     CIPROFLOXACIN >=8 RESISTANT Resistant     ERYTHROMYCIN >=8 RESISTANT Resistant     GENTAMICIN <=0.5 SENSITIVE Sensitive     OXACILLIN >=4 RESISTANT Resistant     TETRACYCLINE <=1 SENSITIVE Sensitive     VANCOMYCIN <=0.5 SENSITIVE Sensitive     TRIMETH/SULFA >=320 RESISTANT Resistant     CLINDAMYCIN <=0.25 SENSITIVE Sensitive     RIFAMPIN <=0.5 SENSITIVE Sensitive     Inducible Clindamycin NEGATIVE Sensitive     LINEZOLID 4 SENSITIVE Sensitive     * RARE METHICILLIN RESISTANT STAPHYLOCOCCUS AUREUS  Expectorated Sputum Assessment w Gram Stain, Rflx to Resp Cult     Status: None   Collection Time: 03/27/23  8:36 AM   Specimen: Sputum  Result Value Ref Range Status   Specimen Description SPUTUM  Final   Special Requests  TASP  Final   Sputum evaluation   Final  THIS SPECIMEN IS ACCEPTABLE FOR SPUTUM CULTURE Performed at [facility], [address] [place], [place] [address]    Report Status 03/27/2023 FINAL  Final  Culture, Respiratory w Gram Stain     Status: None   Collection Time: 03/27/23  8:36 AM   Specimen: SPU  Result Value Ref Range Status   Specimen Description   Final    SPUTUM Performed at [facility], [address], [place], [place] [address]    Special Requests   Final     TASP Reflexed from [phone] Performed at [facility], [address] [place], [place] [address]    Gram Stain   Final    FEW WBC PRESENT, PREDOMINANTLY PMN NO ORGANISMS SEEN    Culture   Final    Normal respiratory flora-no Staph aureus or Pseudomonas seen Performed at [facility], [address] [place], [place] [address]    Report Status 03/30/2023 FINAL  Final  Culture, blood (Routine X 2) w Reflex to ID Panel     Status: None   Collection Time: 03/27/23 12:53 PM   Specimen: BLOOD  Result Value Ref Range Status   Specimen Description BLOOD BLOOD LEFT HAND  Final   Special Requests   Final    BOTTLES DRAWN AEROBIC AND ANAEROBIC Blood Culture  adequate volume   Culture   Final    NO GROWTH 5 DAYS Performed at [facility], [address] [place], [place] [address]    Report Status 04/01/2023 FINAL  Final  Culture, blood (Routine X 2) w Reflex to ID Panel     Status: None   Collection Time: 03/27/23 12:59 PM   Specimen: BLOOD  Result Value Ref Range Status   Specimen Description BLOOD BLOOD RIGHT HAND  Final   Special Requests   Final    BOTTLES DRAWN AEROBIC AND ANAEROBIC Blood Culture results may not be optimal due to an inadequate volume of blood received in culture bottles   Culture   Final    NO GROWTH 5 DAYS Performed at [facility], [address] [place], [place] [address]    Report Status 04/01/2023 FINAL  Final    Procedures and diagnostic studies:  CARDIAC CATHETERIZATION Result Date: 03/31/2023 Findings: RA = 8 RV = 52/16 PA = 51/27 (36) PCW = 19 Fick cardiac output/index = 5.9/2.3 Thermo CO/CI = 6.2/2.5 PVR = 3,4 WU Ao sat = 90% PA sat = 69%, 71% PAPi = 3.0 ABG on 4L O2 7.26/71/71/90% Assessment: 1. 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 Mildly increased left-sided filling pressures 3, Normal cardiac output Plan/Discussion: Volume status about as good as we are going to get it. Continue torsemide 40 daily. Can increase as needed. No role for selective pulmonary artery vasodilators at this time. Consider Pulmonary input to help with BiPAP at time of d/c. Arvilla Meres, MD 1:54 PM               LOS: 12 days   Stesha Neyens  [facility]   Pager on [email or web address]. If 7PM-7AM, please contact night-coverage at [email or web address]     04/01/2023, 11:08 AM

## 2023-04-02 DIAGNOSIS — I5043 Acute on chronic combined systolic (congestive) and diastolic (congestive) heart failure: Secondary | ICD-10-CM | POA: Diagnosis not present

## 2023-04-02 LAB — GLUCOSE, CAPILLARY
Glucose-Capillary: 116 mg/dL — ABNORMAL HIGH (ref 70–99)
Glucose-Capillary: 116 mg/dL — ABNORMAL HIGH (ref 70–99)
Glucose-Capillary: 129 mg/dL — ABNORMAL HIGH (ref 70–99)
Glucose-Capillary: 135 mg/dL — ABNORMAL HIGH (ref 70–99)
Glucose-Capillary: 137 mg/dL — ABNORMAL HIGH (ref 70–99)
Glucose-Capillary: 140 mg/dL — ABNORMAL HIGH (ref 70–99)
Glucose-Capillary: 150 mg/dL — ABNORMAL HIGH (ref 70–99)
Glucose-Capillary: 175 mg/dL — ABNORMAL HIGH (ref 70–99)
Glucose-Capillary: 199 mg/dL — ABNORMAL HIGH (ref 70–99)

## 2023-04-02 LAB — BASIC METABOLIC PANEL
Anion gap: 13 (ref 5–15)
BUN: 46 mg/dL — ABNORMAL HIGH (ref 6–20)
CO2: 31 mmol/L (ref 22–32)
Calcium: 9.3 mg/dL (ref 8.9–10.3)
Chloride: 96 mmol/L — ABNORMAL LOW (ref 98–111)
Creatinine, Ser: 1.38 mg/dL — ABNORMAL HIGH (ref 0.61–1.24)
GFR, Estimated: >60 mL/min (ref >60)
Glucose, Bld: 110 mg/dL — ABNORMAL HIGH (ref 70–99)
Potassium: 3.4 mmol/L — ABNORMAL LOW (ref 3.5–5.1)
Sodium: 140 mmol/L (ref 135–145)

## 2023-04-02 MED ORDER — TORSEMIDE 20 MG PO TABS
40.0000 mg | ORAL_TABLET | Freq: Every day | ORAL | Status: DC
  Administered 2023-04-02 – 2023-04-03 (×2): 40 mg via ORAL
  Filled 2023-04-02 (×2): qty 2

## 2023-04-02 MED ORDER — POTASSIUM CHLORIDE CRYS ER 20 MEQ PO TBCR
20.0000 meq | EXTENDED_RELEASE_TABLET | Freq: Every day | ORAL | Status: DC
  Administered 2023-04-02 – 2023-04-03 (×2): 20 meq via ORAL
  Filled 2023-04-02 (×2): qty 1

## 2023-04-02 MED ORDER — CARVEDILOL 6.25 MG PO TABS
6.2500 mg | ORAL_TABLET | Freq: Two times a day (BID) | ORAL | Status: DC
  Administered 2023-04-02 – 2023-04-03 (×2): 6.25 mg via ORAL
  Filled 2023-04-02 (×3): qty 1

## 2023-04-02 NOTE — Plan of Care (Signed)
  Problem: Education: Goal: Ability to describe self-care measures that may prevent or decrease complications (Diabetes Survival Skills Education) will improve Outcome: Progressing Goal: Individualized Educational Video(s) Outcome: Progressing   Problem: Coping: Goal: Ability to adjust to condition or change in health will improve Outcome: Progressing   Problem: Fluid Volume: Goal: Ability to maintain a balanced intake and output will improve Outcome: Progressing   Problem: Health Behavior/Discharge Planning: Goal: Ability to identify and utilize available resources and services will improve Outcome: Progressing Goal: Ability to manage health-related needs will improve Outcome: Progressing   Problem: Metabolic: Goal: Ability to maintain appropriate glucose levels will improve Outcome: Progressing   Problem: Nutritional: Goal: Maintenance of adequate nutrition will improve Outcome: Progressing Goal: Progress toward achieving an optimal weight will improve Outcome: Progressing   Problem: Skin Integrity: Goal: Risk for impaired skin integrity will decrease Outcome: Progressing   Problem: Tissue Perfusion: Goal: Adequacy of tissue perfusion will improve Outcome: Progressing   Problem: Education: Goal: Knowledge of General Education information will improve Description: Including pain rating scale, medication(s)/side effects and non-pharmacologic comfort measures Outcome: Progressing   Problem: Health Behavior/Discharge Planning: Goal: Ability to manage health-related needs will improve Outcome: Progressing   Problem: Clinical Measurements: Goal: Ability to maintain clinical measurements within normal limits will improve Outcome: Progressing Goal: Will remain free from infection Outcome: Progressing Goal: Diagnostic test results will improve Outcome: Progressing Goal: Respiratory complications will improve Outcome: Progressing Goal: Cardiovascular complication will  be avoided Outcome: Progressing   Problem: Activity: Goal: Risk for activity intolerance will decrease Outcome: Progressing   Problem: Nutrition: Goal: Adequate nutrition will be maintained Outcome: Progressing   Problem: Coping: Goal: Level of anxiety will decrease Outcome: Progressing   Problem: Elimination: Goal: Will not experience complications related to bowel motility Outcome: Progressing Goal: Will not experience complications related to urinary retention Outcome: Progressing   Problem: Pain Managment: Goal: General experience of comfort will improve and/or be controlled Outcome: Progressing   Problem: Safety: Goal: Ability to remain free from injury will improve Outcome: Progressing   Problem: Skin Integrity: Goal: Risk for impaired skin integrity will decrease Outcome: Progressing   Problem: Education: Goal: Ability to demonstrate management of disease process will improve Outcome: Progressing Goal: Ability to verbalize understanding of medication therapies will improve Outcome: Progressing Goal: Individualized Educational Video(s) Outcome: Progressing   Problem: Activity: Goal: Capacity to carry out activities will improve Outcome: Progressing   Problem: Cardiac: Goal: Ability to achieve and maintain adequate cardiopulmonary perfusion will improve Outcome: Progressing   Problem: Education: Goal: Knowledge of disease or condition will improve Outcome: Progressing Goal: Knowledge of the prescribed therapeutic regimen will improve Outcome: Progressing Goal: Individualized Educational Video(s) Outcome: Progressing   Problem: Activity: Goal: Ability to tolerate increased activity will improve Outcome: Progressing   Problem: Respiratory: Goal: Ability to maintain a clear airway will improve Outcome: Progressing Goal: Levels of oxygenation will improve Outcome: Progressing Goal: Ability to maintain adequate ventilation will improve Outcome:  Progressing   Problem: Education: Goal: Understanding of CV disease, CV risk reduction, and recovery process will improve Outcome: Progressing Goal: Individualized Educational Video(s) Outcome: Progressing   Problem: Activity: Goal: Ability to return to baseline activity level will improve Outcome: Progressing   Problem: Cardiovascular: Goal: Ability to achieve and maintain adequate cardiovascular perfusion will improve Outcome: Progressing Goal: Vascular access site(s) Level 0-1 will be maintained Outcome: Progressing   Problem: Health Behavior/Discharge Planning: Goal: Ability to safely manage health-related needs after discharge will improve Outcome: Progressing

## 2023-04-02 NOTE — TOC Progression Note (Signed)
 Transition of Care [facility]) - Progression Note    Patient Details  Name: Gregory Mckenzie MRN: [id number] Date of Birth: June 11, 1975  Transition of Care [facility]) CM/SW Contact  Liliana Cline, LCSW Phone Number: 04/02/2023, 12:32 PM  Clinical Narrative:    Notified by MD that trilogy is needed. Reached out to [place] with [employer] to see what is needed for this to be ordered. Included weekday RNCM in secure email.        Expected Discharge Plan and Services                                               Social Determinants of Health (SDOH) Interventions SDOH Screenings   Food Insecurity: Patient Unable To Answer (03/21/2023)  Housing: Patient Unable To Answer (03/21/2023)  Transportation Needs: Patient Unable To Answer (03/21/2023)  Utilities: Patient Unable To Answer (03/21/2023)  Alcohol Screen: Low Risk  (04/09/2020)  Depression (PHQ2-9): Low Risk  (06/15/2021)  Financial Resource Strain: Low Risk  (04/09/2020)  Physical Activity: Inactive (04/09/2020)  Tobacco Use: Medium Risk (03/20/2023)    Readmission Risk Interventions     No data to display

## 2023-04-02 NOTE — Progress Notes (Addendum)
 Progress Note    Gregory Mckenzie  ZOX:[id number] DOB: 21-Oct-1975  DOA: 03/20/2023 PCP: Pcp, No      Brief Narrative:    Medical records reviewed and are as summarized below:  Gregory Mckenzie is a 48 y.o. male significant PMH of chronic combined systolic and diastolic CHF, atrial fibrillation/flutter, T2DM, HLD, HTN, OSA not on CPAP, obesity hypoventilation syndrome, former smoker, morbid obesity, polycythemia, who was referred from the heart failure clinic because of acute respiratory distress.  Reportedly, he was seen in the heart failure clinic for exertional shortness of breath, fatigue, worsening abdominal distention, increasing pedal edema weight gain and cough.  He had run out of his oxygen the night prior to admission.  He was noted to have oxygen saturation 67% on room air at the CHF clinic.  He was subsequently referred to the ED for further management.    Significant Hospital Events: Including procedures, antibiotic start and stop dates in addition to other pertinent events   2/3: Admitted to hospitalist service with acute on chronic hypoxic hypercapnic respiratory failure in the setting of CHF exacerbation. Failed BiPAP requiring intubation.  PCCM consulted 2/4: remains intubated, on diuresis, PICC line placed 2/5: aggressively diuresed, improved oxygen requirements 2/6: remains intubated, Co-ox 67% this AM. Kidney function improved 2/7: intubated, follows commands, I/O -22L, kidney function improved 2/8: creatinine bumped, follows commands off sedation, I/O -23L 2/9 remains on vent, I/O -26L.  02/11 - Extubated to [facility]. Continues to diurese well.    Patient was transferred to the hospitalist service 03/30/2023. Chart reviewed.   Assessment/Plan:   Principal Problem:   CHF exacerbation (HCC) Active Problems:   Acute respiratory failure with hypoxia and hypercapnia (HCC)   NSTEMI (non-ST elevated myocardial infarction) (HCC)   Acute kidney injury  superimposed on chronic kidney disease (HCC)   Type 2 diabetes mellitus with complication, without long-term current use of insulin (HCC)   Class 3 severe obesity due to excess calories with serious comorbidity and body mass index (BMI) of 45.0 to 49.9 in adult [facility])   Paroxysmal atrial flutter (HCC)   Acute on chronic diastolic heart failure (HCC)   CKD stage 3a, GFR 45-59 ml/min (HCC)   RVF (right ventricular failure) (HCC)   Cardiogenic shock (HCC)   PAF (paroxysmal atrial fibrillation) (HCC)   Acute hypoxic respiratory failure (HCC)   Nutrition Problem: Inadequate oral intake Etiology: inability to eat (pt sedated and ventilated)  Signs/Symptoms: NPO status   Body mass index is 56.04 kg/m.  (Morbid obesity)   Acute on chronic hypoxic and hypercapnic respiratory failure: Improved.  He is tolerating 4 L/min oxygen which is his baseline.  ABG on 04/01/2023 showed pH 7.4, pCO2 58, pO2 66, O2 sat 95.6% on 5 L of oxygen. Continue BiPAP nightly Patient will qualify for NIPPV on BiPAP at home. [name], social worker has been notified to help secure NIPPV for home use. S/p intubation on 03/20/2023 and extubated on 03/28/2023.   Acute exacerbation of chronic heart failure with midrange ejection fraction, significant right ventricular dysfunction and probable significant pulmonary hypertension: Current regimen-torsemide, Entresto, Coreg, Jardiance and spironolactone.   Decrease torsemide from 60 mg to 40 mg daily because of hypotension. 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 2D echo showed EF estimated at 45 to 50%, severe LVH, moderate RV enlargement and moderate RV dysfunction, PASP 44 mmHg.   Paroxysmal atrial fibrillation: Continue Xarelto  and carvedilol. S/p treatment with IV heparin drip   Hypotension/shock: BP stable S/p treatment with IV Levophed  infusion   Persistent fevers: Resolved.  S/p treatment with IV antibiotics.  No growth on blood cultures.   Obesity hypoventilation syndrome/obstructive sleep apnea: Reportedly, he has not used CPAP for 6 months.  Patient has been advised to use trilogy machine/NIPPV at home.   CKD stage IIIa: Creatinine is stable   Elevated troponin: Likely from demand ischemia   Polycythemia: Likely secondary polycythemia from chronic hypoxic respiratory failure   Hypokalemia: Replete potassium and monitor levels   Debility: TOC is working on placement to SNF.  However, patient said he prefers to go home.  Patient informed that it is better to have NIPPV set up for use at home.    Diet Order             DIET DYS 3 Room service appropriate? Yes with Assist; Fluid consistency: Thin  Diet effective now                            Consultants: Cardiologist Intensivist  Procedures: Intubation and mechanical ventilation    Medications:    atorvastatin  80 mg Oral QHS   carvedilol  6.25 mg Oral BID WC   Chlorhexidine Gluconate Cloth  6 each Topical Daily   docusate sodium  100 mg Oral BID   empagliflozin  10 mg Oral Daily   feeding supplement  237 mL Oral TID BM   insulin aspart  2-6 Units Subcutaneous Q4H   multivitamin with minerals  1 tablet Oral Daily   polyethylene glycol  17 g Oral Daily   potassium chloride  20 mEq Oral Daily   rivaroxaban  20 mg Oral Q breakfast   sacubitril-valsartan  1 tablet Oral BID   sennosides  10 mL Oral QHS   sodium chloride flush  10-40 mL Intracatheter Q12H   spironolactone  12.5 mg Oral Daily   torsemide  40 mg Oral Daily   Continuous Infusions:     Anti-infectives (From admission, onward)    Start     Dose/Rate Route Frequency Ordered Stop   03/27/23 1000  vancomycin (VANCOREADY) IVPB 1500 mg/300 mL  Status:  Discontinued        1,500 mg 150 mL/hr over 120 Minutes Intravenous Every 24 hours 03/26/23 1101 03/28/23 1009    03/26/23 1300  vancomycin (VANCOREADY) IVPB 2000 mg/400 mL        2,000 mg 200 mL/hr over 120 Minutes Intravenous  Once 03/26/23 1101 03/26/23 1604   03/26/23 1200  ceFEPIme (MAXIPIME) 2 g in sodium chloride 0.9 % 100 mL IVPB  Status:  Discontinued        2 g 200 mL/hr over 30 Minutes Intravenous Every 8 hours 03/26/23 1054 03/28/23 1009   03/22/23 1000  cefTRIAXone (ROCEPHIN) 2 g in sodium chloride 0.9 % 100 mL IVPB        2 g 200 mL/hr over 30 Minutes Intravenous Every 24 hours 03/22/23 0750 03/26/23 0956   03/21/23 1430  azithromycin (ZITHROMAX) tablet 500 mg  Status:  Discontinued       Placed in "Followed by" Linked Group   500 mg Oral Daily 03/20/23 1418 03/21/23 0837   03/21/23 1200  azithromycin (ZITHROMAX) 500 mg in sodium chloride 0.9 % 250 mL IVPB  Status:  Discontinued        500 mg 250 mL/hr over 60 Minutes Intravenous Every  24 hours 03/21/23 0837 03/21/23 1600   03/20/23 1430  azithromycin (ZITHROMAX) 500 mg in sodium chloride 0.9 % 250 mL IVPB       Placed in "Followed by" Linked Group   500 mg 250 mL/hr over 60 Minutes Intravenous Every 24 hours 03/20/23 1418 03/20/23 1617              Family Communication/Anticipated D/C date and plan/Code Status   DVT prophylaxis:  rivaroxaban (XARELTO) tablet 20 mg     Code Status: Full Code  Family Communication: None Disposition Plan: Plan to discharge to SNF vs home   Status is: Inpatient Remains inpatient appropriate because: CHF exacerbation       Subjective:   Interval events noted.  No shortness of breath, dizziness or chest pain.  He said he feels better.  He said he has been walking to the bathroom without any problems.  He said he no longer wants to go to rehab.  Adelina, RN, was at the bedside  Objective:    Vitals:   04/01/23 2126 04/02/23 0134 04/02/23 0527 04/02/23 0906  BP: 107/89 (!) 128/58 101/77 113/75  Pulse: 90 91 93 92  Resp: (!) 22 14    Temp: 98.2 F (36.8 C) 98.4 F (36.9 C)  98.3 F (36.8 C)   TempSrc: Oral Oral Oral   SpO2: 92% 98% 91% 100%  Weight:      Height:       No data found.   Intake/Output Summary (Last 24 hours) at 04/02/2023 1219 Last data filed at 04/02/2023 0000 Gross per 24 hour  Intake 597 ml  Output 1200 ml  Net -603 ml   Filed Weights   03/28/23 0408 03/29/23 0500 03/30/23 0500  Weight: (!) 164 kg (!) 163.7 kg (!) 157.5 kg    Exam:  GEN: NAD SKIN: Warm and dry EYES: No pallor or icterus ENT: MMM CV: RRR PULM: CTA B ABD: soft, obese, NT, +BS CNS: AAO x 3, non focal EXT: No edema or tenderness                                                                                                                            Data Reviewed:   I have personally reviewed following labs and imaging studies:  Labs: Labs show the following:   Basic Metabolic Panel: Recent Labs  Lab 03/27/23 0358 03/27/23 1707 03/28/23 0504 03/29/23 0828 03/30/23 0631 03/31/23 0639 03/31/23 1327 03/31/23 1329 04/01/23 0439 04/02/23 0319  NA 143 146* 143 142 143 138 147* 142 139 140  K 4.3 4.0 4.1 4.3 4.3 3.5 2.6* 3.4* 3.7 3.4*  CL 102 106 102 103 104 99  --   --  100 96*  CO2 30 29 28 28 29 29   --   --  29 31  GLUCOSE 151* 141* 151* 138* 108* 110*  --   --  95 110*  BUN 43* 38* 41* 45* 43* 48*  --   --  47* 46*  CREATININE 1.59* 1.46* 1.53* 1.43* 1.42* 1.52*  --   --  1.44* 1.38*  CALCIUM 8.9 8.5* 9.1 9.8 9.2 9.0  --   --  9.1 9.3  MG 2.8* 2.5* 2.8*  --   --   --   --   --   --   --   PHOS  --   --  2.8  --   --   --   --   --   --   --    GFR Estimated Creatinine Clearance: 92.8 mL/min (A) (by C-G formula based on SCr of 1.38 mg/dL (H)). Liver Function Tests: No results for input(s): "AST", "ALT", "ALKPHOS", "BILITOT", "PROT", "ALBUMIN" in the last 168 hours.  No results for input(s): "LIPASE", "AMYLASE" in the last 168 hours. No results for input(s): "AMMONIA" in the last 168 hours. Coagulation profile Recent Labs  Lab  03/27/23 0931  INR 1.1    CBC: Recent Labs  Lab 03/27/23 0358 03/28/23 0340 03/29/23 0521 03/31/23 1327 03/31/23 1329  WBC 6.7 9.4 6.5  --   --   HGB 17.9* 19.3* 19.4* 19.7* 21.8*  HCT 58.4* 62.7* 63.5* 58.0* 64.0*  MCV 94.7 94.3 94.8  --   --   PLT 145* 153 143*  --   --    Cardiac Enzymes: No results for input(s): "CKTOTAL", "CKMB", "CKMBINDEX", "TROPONINI" in the last 168 hours. BNP (last 3 results) No results for input(s): "PROBNP" in the last 8760 hours. CBG: Recent Labs  Lab 04/02/23 0002 04/02/23 0133 04/02/23 0502 04/02/23 0937 04/02/23 0957  GLUCAP 116* 116* 137* 135* 140*   D-Dimer: No results for input(s): "DDIMER" in the last 72 hours. Hgb A1c: No results for input(s): "HGBA1C" in the last 72 hours. Lipid Profile: No results for input(s): "CHOL", "HDL", "LDLCALC", "TRIG", "CHOLHDL", "LDLDIRECT" in the last 72 hours. Thyroid function studies: No results for input(s): "TSH", "T4TOTAL", "T3FREE", "THYROIDAB" in the last 72 hours.  Invalid input(s): "FREET3" Anemia work up: No results for input(s): "VITAMINB12", "FOLATE", "FERRITIN", "TIBC", "IRON", "RETICCTPCT" in the last 72 hours. Sepsis Labs: Recent Labs  Lab 03/27/23 0358 03/28/23 0340 03/29/23 0521  WBC 6.7 9.4 6.5    Microbiology Recent Results (from the past 240 hours)  Culture, Respiratory w Gram Stain     Status: None   Collection Time: 03/26/23  7:25 AM   Specimen: Tracheal Aspirate; Respiratory  Result Value Ref Range Status   Specimen Description   Final    TRACHEAL ASPIRATE Performed at [facility], [address] [place], [place] [address]    Special Requests   Final    NONE Performed at [facility], [address] [place], [place] [address]    Gram Stain   Final    ABUNDANT WBC PRESENT, PREDOMINANTLY PMN NO ORGANISMS SEEN    Culture   Final    RARE METHICILLIN RESISTANT STAPHYLOCOCCUS AUREUS FEW CORYNEBACTERIUM STRIATUM Standardized  susceptibility testing for this organism is not available. Performed at [facility], [address] [place], [place] [address]    Report Status 03/29/2023 FINAL  Final   Organism ID, Bacteria METHICILLIN RESISTANT STAPHYLOCOCCUS AUREUS  Final      Susceptibility   Methicillin resistant staphylococcus aureus - MIC*    CIPROFLOXACIN >=8 RESISTANT Resistant     ERYTHROMYCIN >=8 RESISTANT Resistant     GENTAMICIN <=0.5 SENSITIVE Sensitive     OXACILLIN >=4 RESISTANT Resistant     TETRACYCLINE <=1 SENSITIVE Sensitive  VANCOMYCIN <=0.5 SENSITIVE Sensitive     TRIMETH/SULFA >=320 RESISTANT Resistant     CLINDAMYCIN <=0.25 SENSITIVE Sensitive     RIFAMPIN <=0.5 SENSITIVE Sensitive     Inducible Clindamycin NEGATIVE Sensitive     LINEZOLID 4 SENSITIVE Sensitive     * RARE METHICILLIN RESISTANT STAPHYLOCOCCUS AUREUS  Expectorated Sputum Assessment w Gram Stain, Rflx to Resp Cult     Status: None   Collection Time: 03/27/23  8:36 AM   Specimen: Sputum  Result Value Ref Range Status   Specimen Description SPUTUM  Final   Special Requests  TASP  Final   Sputum evaluation   Final    THIS SPECIMEN IS ACCEPTABLE FOR SPUTUM CULTURE Performed at [facility], [address], [place], [place] [address]    Report Status 03/27/2023 FINAL  Final  Culture, Respiratory w Gram Stain     Status: None   Collection Time: 03/27/23  8:36 AM   Specimen: SPU  Result Value Ref Range Status   Specimen Description   Final    SPUTUM Performed at [facility], [address], [place], [place] [address]    Special Requests   Final     TASP Reflexed from [phone] Performed at [facility], [address] [place], [place] [address]    Gram Stain   Final    FEW WBC PRESENT, PREDOMINANTLY PMN NO ORGANISMS SEEN    Culture   Final    Normal respiratory flora-no Staph aureus or Pseudomonas seen Performed at [facility], [address] [place], [place] [address]     Report Status 03/30/2023 FINAL  Final  Culture, blood (Routine X 2) w Reflex to ID Panel     Status: None   Collection Time: 03/27/23 12:53 PM   Specimen: BLOOD  Result Value Ref Range Status   Specimen Description BLOOD BLOOD LEFT HAND  Final   Special Requests   Final    BOTTLES DRAWN AEROBIC AND ANAEROBIC Blood Culture adequate volume   Culture   Final    NO GROWTH 5 DAYS Performed at [facility], [address] [place], [place] [address]    Report Status 04/01/2023 FINAL  Final  Culture, blood (Routine X 2) w Reflex to ID Panel     Status: None   Collection Time: 03/27/23 12:59 PM   Specimen: BLOOD  Result Value Ref Range Status   Specimen Description BLOOD BLOOD RIGHT HAND  Final   Special Requests   Final    BOTTLES DRAWN AEROBIC AND ANAEROBIC Blood Culture results may not be optimal due to an inadequate volume of blood received in culture bottles   Culture   Final    NO GROWTH 5 DAYS Performed at [facility], [address], [place], [place] [address]    Report Status 04/01/2023 FINAL  Final    Procedures and diagnostic studies:  CARDIAC CATHETERIZATION Result Date: 03/31/2023 Findings: RA = 8 RV = 52/16 PA = 51/27 (36) PCW = 19 Fick cardiac output/index = 5.9/2.3 Thermo CO/CI = 6.2/2.5 PVR = 3,4 WU Ao sat = 90% PA sat = 69%, 71% PAPi = 3.0 ABG on 4L O2 7.26/71/71/90% Assessment: 1. 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 Mildly increased left-sided filling pressures 3, Normal cardiac output Plan/Discussion: Volume status about as good as we are going to get it. Continue torsemide 40 daily. Can increase as needed. No role for selective pulmonary artery vasodilators at this time. Consider Pulmonary input  to help with BiPAP at time of d/c. Arvilla Meres, MD 1:54 PM               LOS: 13 days   Izumi Mixon  [facility]   Pager on [email or web address]. If 7PM-7AM, please contact night-coverage at [email or web address]     04/02/2023, 12:19  PM

## 2023-04-03 ENCOUNTER — Encounter: Payer: Self-pay | Admitting: Internal Medicine

## 2023-04-03 ENCOUNTER — Other Ambulatory Visit: Payer: Self-pay

## 2023-04-03 DIAGNOSIS — I5043 Acute on chronic combined systolic (congestive) and diastolic (congestive) heart failure: Secondary | ICD-10-CM | POA: Diagnosis not present

## 2023-04-03 LAB — BASIC METABOLIC PANEL
Anion gap: 10 (ref 5–15)
BUN: 34 mg/dL — ABNORMAL HIGH (ref 6–20)
CO2: 30 mmol/L (ref 22–32)
Calcium: 9.2 mg/dL (ref 8.9–10.3)
Chloride: 96 mmol/L — ABNORMAL LOW (ref 98–111)
Creatinine, Ser: 1.11 mg/dL (ref 0.61–1.24)
GFR, Estimated: >60 mL/min (ref >60)
Glucose, Bld: 95 mg/dL (ref 70–99)
Potassium: 3.8 mmol/L (ref 3.5–5.1)
Sodium: 136 mmol/L (ref 135–145)

## 2023-04-03 LAB — GLUCOSE, CAPILLARY
Glucose-Capillary: 132 mg/dL — ABNORMAL HIGH (ref 70–99)
Glucose-Capillary: 94 mg/dL (ref 70–99)

## 2023-04-03 MED ORDER — SACUBITRIL-VALSARTAN 24-26 MG PO TABS
1.0000 | ORAL_TABLET | Freq: Two times a day (BID) | ORAL | 0 refills | Status: DC
  Filled 2023-04-03: qty 60, 30d supply, fill #0

## 2023-04-03 MED ORDER — SPIRONOLACTONE 25 MG PO TABS
12.5000 mg | ORAL_TABLET | Freq: Every day | ORAL | Status: DC
Start: 2023-04-03 — End: 2023-04-19

## 2023-04-03 NOTE — Plan of Care (Signed)
  Problem: Education: Goal: Ability to describe self-care measures that may prevent or decrease complications (Diabetes Survival Skills Education) will improve Outcome: Progressing Goal: Individualized Educational Video(s) Outcome: Progressing   Problem: Coping: Goal: Ability to adjust to condition or change in health will improve Outcome: Progressing   Problem: Fluid Volume: Goal: Ability to maintain a balanced intake and output will improve Outcome: Progressing   Problem: Health Behavior/Discharge Planning: Goal: Ability to identify and utilize available resources and services will improve Outcome: Progressing Goal: Ability to manage health-related needs will improve Outcome: Progressing   Problem: Metabolic: Goal: Ability to maintain appropriate glucose levels will improve Outcome: Progressing   Problem: Nutritional: Goal: Maintenance of adequate nutrition will improve Outcome: Progressing Goal: Progress toward achieving an optimal weight will improve Outcome: Progressing   Problem: Skin Integrity: Goal: Risk for impaired skin integrity will decrease Outcome: Progressing   Problem: Tissue Perfusion: Goal: Adequacy of tissue perfusion will improve Outcome: Progressing   Problem: Education: Goal: Knowledge of General Education information will improve Description: Including pain rating scale, medication(s)/side effects and non-pharmacologic comfort measures Outcome: Progressing   Problem: Health Behavior/Discharge Planning: Goal: Ability to manage health-related needs will improve Outcome: Progressing   Problem: Clinical Measurements: Goal: Ability to maintain clinical measurements within normal limits will improve Outcome: Progressing Goal: Will remain free from infection Outcome: Progressing Goal: Diagnostic test results will improve Outcome: Progressing Goal: Respiratory complications will improve Outcome: Progressing Goal: Cardiovascular complication will  be avoided Outcome: Progressing   Problem: Activity: Goal: Risk for activity intolerance will decrease Outcome: Progressing   Problem: Nutrition: Goal: Adequate nutrition will be maintained Outcome: Progressing   Problem: Coping: Goal: Level of anxiety will decrease Outcome: Progressing   Problem: Elimination: Goal: Will not experience complications related to bowel motility Outcome: Progressing Goal: Will not experience complications related to urinary retention Outcome: Progressing   Problem: Pain Managment: Goal: General experience of comfort will improve and/or be controlled Outcome: Progressing   Problem: Safety: Goal: Ability to remain free from injury will improve Outcome: Progressing   Problem: Skin Integrity: Goal: Risk for impaired skin integrity will decrease Outcome: Progressing   Problem: Education: Goal: Ability to demonstrate management of disease process will improve Outcome: Progressing Goal: Ability to verbalize understanding of medication therapies will improve Outcome: Progressing Goal: Individualized Educational Video(s) Outcome: Progressing   Problem: Activity: Goal: Capacity to carry out activities will improve Outcome: Progressing   Problem: Cardiac: Goal: Ability to achieve and maintain adequate cardiopulmonary perfusion will improve Outcome: Progressing   Problem: Education: Goal: Knowledge of disease or condition will improve Outcome: Progressing Goal: Knowledge of the prescribed therapeutic regimen will improve Outcome: Progressing Goal: Individualized Educational Video(s) Outcome: Progressing   Problem: Activity: Goal: Ability to tolerate increased activity will improve Outcome: Progressing   Problem: Respiratory: Goal: Ability to maintain a clear airway will improve Outcome: Progressing Goal: Levels of oxygenation will improve Outcome: Progressing Goal: Ability to maintain adequate ventilation will improve Outcome:  Progressing   Problem: Education: Goal: Understanding of CV disease, CV risk reduction, and recovery process will improve Outcome: Progressing Goal: Individualized Educational Video(s) Outcome: Progressing   Problem: Activity: Goal: Ability to return to baseline activity level will improve Outcome: Progressing   Problem: Cardiovascular: Goal: Ability to achieve and maintain adequate cardiovascular perfusion will improve Outcome: Progressing Goal: Vascular access site(s) Level 0-1 will be maintained Outcome: Progressing   Problem: Health Behavior/Discharge Planning: Goal: Ability to safely manage health-related needs after discharge will improve Outcome: Progressing

## 2023-04-03 NOTE — Progress Notes (Signed)
 AVS review and given to patient, piv removed, all questions answered.

## 2023-04-03 NOTE — Plan of Care (Signed)
  Problem: Education: Goal: Ability to describe self-care measures that may prevent or decrease complications (Diabetes Survival Skills Education) will improve Outcome: Completed/Met Goal: Individualized Educational Video(s) Outcome: Completed/Met   Problem: Coping: Goal: Ability to adjust to condition or change in health will improve Outcome: Completed/Met   Problem: Fluid Volume: Goal: Ability to maintain a balanced intake and output will improve Outcome: Completed/Met   Problem: Health Behavior/Discharge Planning: Goal: Ability to identify and utilize available resources and services will improve Outcome: Completed/Met Goal: Ability to manage health-related needs will improve Outcome: Completed/Met   Problem: Metabolic: Goal: Ability to maintain appropriate glucose levels will improve Outcome: Completed/Met   Problem: Nutritional: Goal: Maintenance of adequate nutrition will improve Outcome: Completed/Met Goal: Progress toward achieving an optimal weight will improve Outcome: Completed/Met   Problem: Skin Integrity: Goal: Risk for impaired skin integrity will decrease Outcome: Completed/Met   Problem: Tissue Perfusion: Goal: Adequacy of tissue perfusion will improve Outcome: Completed/Met   Problem: Education: Goal: Knowledge of General Education information will improve Description: Including pain rating scale, medication(s)/side effects and non-pharmacologic comfort measures Outcome: Completed/Met   Problem: Health Behavior/Discharge Planning: Goal: Ability to manage health-related needs will improve Outcome: Completed/Met   Problem: Clinical Measurements: Goal: Ability to maintain clinical measurements within normal limits will improve Outcome: Completed/Met Goal: Will remain free from infection Outcome: Completed/Met Goal: Diagnostic test results will improve Outcome: Completed/Met Goal: Respiratory complications will improve Outcome: Completed/Met Goal:  Cardiovascular complication will be avoided Outcome: Completed/Met   Problem: Activity: Goal: Risk for activity intolerance will decrease Outcome: Completed/Met   Problem: Nutrition: Goal: Adequate nutrition will be maintained Outcome: Completed/Met   Problem: Coping: Goal: Level of anxiety will decrease Outcome: Completed/Met   Problem: Elimination: Goal: Will not experience complications related to bowel motility Outcome: Completed/Met Goal: Will not experience complications related to urinary retention Outcome: Completed/Met   Problem: Pain Managment: Goal: General experience of comfort will improve and/or be controlled Outcome: Completed/Met   Problem: Safety: Goal: Ability to remain free from injury will improve Outcome: Completed/Met   Problem: Skin Integrity: Goal: Risk for impaired skin integrity will decrease Outcome: Completed/Met   Problem: Education: Goal: Ability to demonstrate management of disease process will improve Outcome: Completed/Met Goal: Ability to verbalize understanding of medication therapies will improve Outcome: Completed/Met Goal: Individualized Educational Video(s) Outcome: Completed/Met   Problem: Activity: Goal: Capacity to carry out activities will improve Outcome: Completed/Met   Problem: Cardiac: Goal: Ability to achieve and maintain adequate cardiopulmonary perfusion will improve Outcome: Completed/Met   Problem: Education: Goal: Knowledge of disease or condition will improve Outcome: Completed/Met Goal: Knowledge of the prescribed therapeutic regimen will improve Outcome: Completed/Met Goal: Individualized Educational Video(s) Outcome: Completed/Met   Problem: Activity: Goal: Ability to tolerate increased activity will improve Outcome: Completed/Met   Problem: Respiratory: Goal: Ability to maintain a clear airway will improve Outcome: Completed/Met Goal: Levels of oxygenation will improve Outcome:  Completed/Met Goal: Ability to maintain adequate ventilation will improve Outcome: Completed/Met   Problem: Education: Goal: Understanding of CV disease, CV risk reduction, and recovery process will improve Outcome: Completed/Met Goal: Individualized Educational Video(s) Outcome: Completed/Met   Problem: Activity: Goal: Ability to return to baseline activity level will improve Outcome: Completed/Met   Problem: Cardiovascular: Goal: Ability to achieve and maintain adequate cardiovascular perfusion will improve Outcome: Completed/Met Goal: Vascular access site(s) Level 0-1 will be maintained Outcome: Completed/Met   Problem: Health Behavior/Discharge Planning: Goal: Ability to safely manage health-related needs after discharge will improve Outcome: Completed/Met

## 2023-04-03 NOTE — Progress Notes (Signed)
 Speech Language Pathology Treatment: Dysphagia  Patient Details Name: Gregory Mckenzie MRN: [id number] DOB: 1975/10/21 Today's Date: 04/03/2023 Time: 4098-1191 SLP Time Calculation (min) (ACUTE ONLY): 25 min  Assessment / Plan / Recommendation Clinical Impression  Pt seen for ongoing assessment of toleration of his oral diet; education re: swallowing, diet consistency rec'd, and general aspiration precautions including recommendation for safer swallowing of Pills by use of a PUREE -- for ease of Pill swallowing as needed.  Pt sitting EOB upon entering room; dressed. Discussed w/ pt the current diet consistency including use of Cut meats w/ moistened, cooked foods for ease of mastication d/t his Missing Dentition status Baseline, and for conservation of energy overall. Pt stated he was tolerating the current diet and "liked the meats Cut already". He denied any overt s/s of aspiration w/ oral intake at recent meals. Discussed ways to reduce risks for aspiration by following general aspiration precautions to include Small sips Slowly, No straws if coughing noted, and Sit fully Upright w/ all oral intake. Recommended reducing distractions including Talking at meals and clearing mouth fully b/t Small bites/sips. Also discussed food consistencies and options, preparation, and use of condiments to soften foods also. Encouragement given for ALL Pills to be swallowed using a Puree as needed to. Pt agreed. Handouts given on general aspiration precautions and Pill swallowing option; diet consistency and preparation suggestions and options.      Recommend continue a Dysphagia level 3(Chopped meats) w/ Thin liquids- monitor any straw use; aspiration precautions; Pills in a Puree for safer swallowing. Education completed w/ pt. No further skilled ST services indicated currently, nor at Discharge. Pt appears at his swallowing Baseline. Pt agreed. NSG to reconsult if any needs prior to Discharge.       HPI  HPI: Pt is a 48 y/o M admitted on 03/20/23 after presenting from the heart failure clinic with c/c of acute respiratory distress. Pt required intubation & mechanical ventilation, extubated 03/28/23. Pt is being treated for acute on chronic hypoxic hypercapneic respiratory failure 2/2 CHF exacerbation in the setting of HFmrEF. PMH: Obesity, chronic combined systolic & diastolic CHF, a-fib/flutter, DM2, HLD, HTN, OSA not on CPAP,  medication non-compliance and marijuna use per chart notes, hypoventilation syndrome, former smoker, polycythemia.   CXR at admit: Low lung volumes with vascular congestion and bibasilar  collapse/consolidation. Cardiomegaly, vascular congestion.      SLP Plan  All goals met      Recommendations for follow up therapy are one component of a multi-disciplinary discharge planning process, led by the attending physician.  Recommendations may be updated based on patient status, additional functional criteria and insurance authorization.    Recommendations  Diet recommendations: Dysphagia 3 (mechanical soft);Thin liquid (soft easy to chew foods) Liquids provided via: Cup;Straw (monitor) Medication Administration: Whole meds with puree (for ease of swallowing as needed) Supervision: Patient able to self feed Compensations: Minimize environmental distractions;Slow rate;Small sips/bites;Lingual sweep for clearance of pocketing;Follow solids with liquid Postural Changes and/or Swallow Maneuvers: Out of bed for meals;Seated upright 90 degrees;Upright 30-60 min after meal                 (Dietician f/u) Oral care BID;Oral care before and after PO;Staff/trained caregiver to provide oral care   PRN Dysphagia, unspecified (R13.10) (missing Dentition baseline)     All goals met       Jerilynn Som, MS, CCC-SLP Speech Language Pathologist Rehab Services; [facility] [phone] (ascom) Gregory Mckenzie  04/03/2023, 3:40 PM

## 2023-04-03 NOTE — TOC Progression Note (Addendum)
 Transition of Care [facility]) - Progression Note    Patient Details  Name: Gregory Mckenzie MRN: [id number] Date of Birth: Aug 23, 1975  Transition of Care [facility] CM/SW Contact  Truddie Hidden, RN Phone Number: 04/03/2023, 11:04 AM  Clinical Narrative:    Message sent to Mitch from [employer] to request NIV.   12:57pm Per MD patient was advised his NIV order was in processed for approval. RNCM Spoke with patient. He stated he was already at home. Patient advised his NIV was still in approval process. Patient state he has oxygen. NIV order sent to [facility] from [employer]. PFT unable to be located. NIV unable to be approved without PFT  03:10pm Spoke with patient to advise without a spirometry the NIV would likely be denied per Mitch the [employer] rep. Patient advised to follow up with his pulmonologist and inform him he was in process of obtaining a NIV.   TOC signing off            Expected Discharge Plan and Services         Expected Discharge Date: 04/03/23                                     Social Determinants of Health (SDOH) Interventions SDOH Screenings   Food Insecurity: Patient Unable To Answer (03/21/2023)  Housing: Patient Unable To Answer (03/21/2023)  Transportation Needs: Patient Unable To Answer (03/21/2023)  Utilities: Patient Unable To Answer (03/21/2023)  Alcohol Screen: Low Risk  (04/09/2020)  Depression (PHQ2-9): Low Risk  (06/15/2021)  Financial Resource Strain: Low Risk  (04/09/2020)  Physical Activity: Inactive (04/09/2020)  Tobacco Use: Medium Risk (03/20/2023)    Readmission Risk Interventions     No data to display

## 2023-04-03 NOTE — Discharge Summary (Signed)
 Physician Discharge Summary   Patient: Gregory Mckenzie MRN: [id number] DOB: 09-08-1975  Admit date:     03/20/2023  Discharge date: 04/03/23  Discharge Physician: Lurene Shadow   PCP: Pcp, No   Recommendations at discharge:   Follow-up with PCP in 1 week  Discharge Diagnoses: Principal Problem:   CHF exacerbation (HCC) Active Problems:   Acute respiratory failure with hypoxia and hypercapnia (HCC)   NSTEMI (non-ST elevated myocardial infarction) (HCC)   Acute kidney injury superimposed on chronic kidney disease (HCC)   Type 2 diabetes mellitus with complication, without long-term current use of insulin (HCC)   Class 3 severe obesity due to excess calories with serious comorbidity and body mass index (BMI) of 45.0 to 49.9 in adult [facility])   Paroxysmal atrial flutter (HCC)   Acute on chronic diastolic heart failure (HCC)   CKD stage 3a, GFR 45-59 ml/min (HCC)   RVF (right ventricular failure) (HCC)   Cardiogenic shock (HCC)   PAF (paroxysmal atrial fibrillation) (HCC)   Acute hypoxic respiratory failure (HCC)  Resolved Problems:   * No resolved hospital problems. *  Hospital Course:  Gregory Mckenzie is a 48 y.o. male significant PMH of chronic combined systolic and diastolic CHF, atrial fibrillation/flutter, T2DM, HLD, HTN, OSA not on CPAP, obesity hypoventilation syndrome, former smoker, morbid obesity, polycythemia, who was referred from the heart failure clinic because of acute respiratory distress.  Reportedly, he was seen in the heart failure clinic for exertional shortness of breath, fatigue, worsening abdominal distention, increasing pedal edema weight gain and cough.  He had run out of his oxygen the night prior to admission.  He was noted to have oxygen saturation 67% on room air at the CHF clinic.  He was subsequently referred to the ED for further management.       Significant Hospital Events: Including procedures, antibiotic start and stop dates in addition to  other pertinent events   2/3: Admitted to hospitalist service with acute on chronic hypoxic hypercapnic respiratory failure in the setting of CHF exacerbation. Failed BiPAP requiring intubation.  PCCM consulted 2/4: remains intubated, on diuresis, PICC line placed 2/5: aggressively diuresed, improved oxygen requirements 2/6: remains intubated, Co-ox 67% this AM. Kidney function improved 2/7: intubated, follows commands, I/O -22L, kidney function improved 2/8: creatinine bumped, follows commands off sedation, I/O -23L 2/9 remains on vent, I/O -26L.  02/11 - Extubated to [facility]. Continues to diurese well.      Patient was transferred to the hospitalist service on 03/30/2023    Assessment and Plan:   Acute on chronic hypoxic and hypercapnic respiratory failure: Improved.  He is tolerating 4 L/min oxygen which is his baseline.  ABG on 04/01/2023 showed pH 7.4, pCO2 58, pO2 66, O2 sat 95.6% on 5 L of oxygen. Patient will qualify for NIPPV on BiPAP at home.  Patient' condition quickly deteriorates without ventilator. Removal of the ventilator may cause serious harm to the patient, exacerbation of condition and hospital readmission.  Bilevel/RAD has been tried and failed to maintain or stabilize the patient. Bilevel cannot meet current volume requirements. Patient requires frequent durations of ventilatory support. Intermittent usage is insufficient.   Transition of care team was working on obtaining NIPPV prior to discharge.  However, patient did not want to wait for NIPPV to be delivered prior to discharge.  This may be delivered to his house after discharge. S/p intubation on 03/20/2023 and extubated on 03/28/2023.     Acute exacerbation of chronic heart failure with midrange  ejection fraction, significant right ventricular dysfunction and probable significant pulmonary hypertension: Current regimen-torsemide, Entresto, Coreg, Jardiance and spironolactone.   Torsemide was decreased from 60 mg to  40 mg daily, spironolactone decreased from 25 to 12.5 mg and Entresto decreased from 49-51 tablet to 24 to 26 tablet because of hypotension. Outpatient follow-up with cardiologist recommended. 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 2D echo showed EF estimated at 45 to 50%, severe LVH, moderate RV enlargement and moderate RV dysfunction, PASP 44 mmHg.     Paroxysmal atrial fibrillation: Continue Xarelto and carvedilol. S/p treatment with IV heparin drip     Hypotension/shock: BP stable S/p treatment with IV Levophed infusion     Persistent fevers: Resolved.  S/p treatment with IV antibiotics.  No growth on blood cultures.     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  Recommended NIPPV nightly at discharge.  Patient understands that failure to use this can result in acute decompensation with worsening respiratory failure, readmission and increased risk of mortality.     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  However, patient declined SNF and prefers to go home.  He understands that he is at increased risk for falls.    His condition has improved significantly.  He insists on going home as soon as possible.  He will be discharged home today.       Consultants: Cardiologist, intensivist Procedures performed: Intubation and mechanical ventilation Disposition: Home Diet recommendation:  Discharge Diet Orders (From admission, onward)     Start     Ordered   04/03/23 0000  DIET DYS 3       Question:  Fluid consistency:  Answer:  Thin   04/03/23  0913           Dysphagia type 3 thin Liquid DISCHARGE MEDICATION: Allergies as of 04/03/2023   No Known Allergies      Medication List     STOP taking these medications    Entresto 49-51 MG Generic drug: sacubitril-valsartan Replaced by: sacubitril-valsartan 24-26 MG       TAKE these medications    atorvastatin 40 MG tablet Commonly known as: LIPITOR Take 2 tablets (80 mg total) by mouth at bedtime.   carvedilol 6.25 MG tablet Commonly known as: COREG Take 1 tablet (6.25 mg total) by mouth 2 (two) times daily with a meal.   Jardiance 10 MG Tabs tablet Generic drug: empagliflozin Take 1 tablet (10 mg total) by mouth daily before breakfast. What changed: Another medication with the same name was removed. Continue taking this medication, and follow the directions you see here.   OXYGEN Inhale 4 L into the lungs at bedtime.   sacubitril-valsartan 24-26 MG Commonly known as: ENTRESTO Take 1 tablet by mouth 2 (two) times daily. Replaces: Entresto 49-51 MG   spironolactone 25 MG tablet Commonly known as: ALDACTONE Take 0.5 tablets (12.5 mg total) by mouth daily. What changed: how much to take   torsemide 20 MG tablet Commonly known as: DEMADEX Take 2 tablets (40 mg total) by mouth daily.   Xarelto 20 MG Tabs tablet Generic drug: rivaroxaban  Take 1 tablet (20 mg total) by mouth daily with supper.        Follow-up Information     [facility]. Go on 04/05/2023.   Specialty: Cardiology Why: Hospital Follow-Up 04/05/23 @ 11:15 Please bring all medications to follow-up appointment [facility], [address] Floor Free Valet Parking @ the door Contact information: [address] [place] [address] [phone]               Discharge Exam: Ceasar Mons Weights   03/28/23 0408 03/29/23 0500 03/30/23 0500  Weight: (!) 164 kg (!) 163.7 kg (!) 157.5 kg   GEN: NAD SKIN: Warm  and dry EYES: No pallor or icterus ENT: MMM CV: RRR PULM: CTA B ABD: soft, obese, NT, +BS CNS: AAO x 3, non focal EXT: No edema or tenderness   Condition at discharge: good  The results of significant diagnostics from this hospitalization (including imaging, microbiology, ancillary and laboratory) are listed below for reference.   Imaging Studies: CARDIAC CATHETERIZATION Result Date: 03/31/2023 Findings: RA = 8 RV = 52/16 PA = 51/27 (36) PCW = 19 Fick cardiac output/index = 5.9/2.3 Thermo CO/CI = 6.2/2.5 PVR = 3,4 WU Ao sat = 90% PA sat = 69%, 71% PAPi = 3.0 ABG on 4L O2 7.26/71/71/90% Assessment: 1. 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 Mildly increased left-sided filling pressures 3, Normal cardiac output Plan/Discussion: Volume status about as good as we are going to get it. Continue torsemide 40 daily. Can increase as needed. No role for selective pulmonary artery vasodilators at this time. Consider Pulmonary input to help with BiPAP at time of d/c. Arvilla Meres, MD 1:54 PM  DG Chest Port 1 View Result Date: 03/29/2023 CLINICAL DATA:  10026 Shortness of breath 10026 EXAM: PORTABLE CHEST 1 VIEW COMPARISON:  03/27/2023 FINDINGS: Endotracheal and enteric tubes have been removed. Right PICC line also removed. Stable cardiomegaly. Pulmonary vascular congestion. No focal airspace consolidation, pleural effusion, or pneumothorax. IMPRESSION: Cardiomegaly with pulmonary vascular congestion. Electronically Signed   By: Duanne Guess D.O.   On: 03/29/2023 12:08   DG Chest Port 1 View Result Date: 03/27/2023 CLINICAL DATA:  Shortness of breath EXAM: PORTABLE CHEST 1 VIEW COMPARISON:  03/22/2023 FINDINGS: Cardiac shadow is stable. Endotracheal tube is noted just above the carina. This should be withdrawn 2-3 cm. Right PICC and gastric catheter are seen. The tip of the gastric catheter is not well appreciated on this exam. Lungs are hypoinflated with mild vascular congestion. No sizable  effusion is noted. IMPRESSION: Endotracheal tube at the level of the carina. This should be withdrawn 2-3 cm. These results will be called to the ordering clinician or representative by the Radiologist Assistant, and communication documented in the PACS or [employer]. Electronically Signed   By: Alcide Clever M.D.   On: 03/27/2023 10:55   DG Abd 1 View Result Date: 03/22/2023 CLINICAL DATA:  48 year old male enteric tube placement. EXAM: ABDOMEN - 1 VIEW COMPARISON:  03/21/2023 and earlier. FINDINGS: Portable AP semi upright view at 0859 hours. Enteric tube placed into the stomach and terminates across midline to the right at the level of the gastric antrum. Compared to portable chest x-ray 0308 hours today there is bilateral dense lung base opacity. Nonobstructed visible bowel gas pattern. IMPRESSION: 1. Satisfactory enteric tube placement into the distal stomach. 2. Confluent bilateral lung base opacity. Electronically Signed   By: Odessa Fleming M.D.   On: 03/22/2023 10:05  DG Chest Port 1 View Result Date: 03/22/2023 CLINICAL DATA:  Acute respiratory failure with hypoxia. EXAM: PORTABLE CHEST 1 VIEW COMPARISON:  03/21/2023 FINDINGS: Infected tube tip is 5.2 cm above the base of the carina. Low lung volumes with vascular congestion. Bibasilar collapse/consolidation is similar to prior. The cardio pericardial silhouette is enlarged. Telemetry leads overlie the chest. IMPRESSION: Low lung volumes with vascular congestion and bibasilar collapse/consolidation. Electronically Signed   By: Kennith Center M.D.   On: 03/22/2023 07:33   ECHOCARDIOGRAM COMPLETE Result Date: 03/21/2023    ECHOCARDIOGRAM REPORT   Patient Name:   Gregory Mckenzie Date of Exam: 03/21/2023 Medical Rec #:  [id number]            Height:       66.0 in Accession #:    [id number]           Weight:       343.0 lb Date of Birth:  [birth year]            BSA:          2.515 m Patient Age:    47 years             BP:           113/70 mmHg Patient  Gender: M                    HR:           66 bpm. Exam Location:  [facility] Procedure: 2D Echo, Cardiac Doppler, Color Doppler and Intracardiac            Opacification Agent Indications:     CHF  History:         Patient has prior history of Echocardiogram examinations, most                  recent 10/20/2022. CHF and Cardiomyopathy, Acute MI,                  Arrythmias:Atrial Flutter, Signs/Symptoms:Edema and Dyspnea;                  Risk Factors:Hypertension and Diabetes. CKD.  Sonographer:     Mikki Harbor Referring Phys:  [id number] ELIZABETH ACHIENG OUMA Diagnosing Phys: Alwyn Pea MD  Sonographer Comments: Technically difficult study due to poor echo windows, no subcostal window, patient is obese and echo performed with patient supine and on artificial respirator. IMPRESSIONS  1. Left ventricular ejection fraction, by estimation, is 45 to 50%. The left ventricle has mildly decreased function. The left ventricle demonstrates global hypokinesis. There is severe concentric left ventricular hypertrophy. Left ventricular diastolic  parameters are consistent with Grade I diastolic dysfunction (impaired relaxation).  2. Right ventricular systolic function is moderately reduced. The right ventricular size is moderately enlarged. Mildly increased right ventricular wall thickness. There is mildly elevated pulmonary artery systolic pressure.  3. Left atrial size was mildly dilated.  4. Right atrial size was mildly dilated.  5. The mitral valve is normal in structure. No evidence of mitral valve regurgitation.  6. The aortic valve is normal in structure. Aortic valve regurgitation is not visualized. FINDINGS  Left Ventricle: Left ventricular ejection fraction, by estimation, is 45 to 50%. The left ventricle has mildly decreased function. The left ventricle demonstrates global hypokinesis. Definity contrast agent was given IV to delineate the left ventricular  endocardial borders. The left ventricular internal cavity  size was normal in size. There is severe concentric  left ventricular hypertrophy. Left ventricular diastolic parameters are consistent with Grade I diastolic dysfunction (impaired relaxation). Right Ventricle: The right ventricular size is moderately enlarged. Mildly increased right ventricular wall thickness. Right ventricular systolic function is moderately reduced. There is mildly elevated pulmonary artery systolic pressure. The tricuspid regurgitant velocity is 2.67 m/s, and with an assumed right atrial pressure of 15 mmHg, the estimated right ventricular systolic pressure is 43.5 mmHg. Left Atrium: Left atrial size was mildly dilated. Right Atrium: Right atrial size was mildly dilated. Pericardium: There is no evidence of pericardial effusion. Mitral Valve: The mitral valve is normal in structure. No evidence of mitral valve regurgitation. MV peak gradient, 1.8 mmHg. The mean mitral valve gradient is 1.0 mmHg. Tricuspid Valve: The tricuspid valve is normal in structure. Tricuspid valve regurgitation is trivial. Aortic Valve: The aortic valve is normal in structure. Aortic valve regurgitation is not visualized. Aortic valve mean gradient measures 3.0 mmHg. Aortic valve peak gradient measures 6.8 mmHg. Aortic valve area, by VTI measures 2.82 cm. Pulmonic Valve: The pulmonic valve was grossly normal. Pulmonic valve regurgitation is not visualized. Aorta: The ascending aorta was not well visualized. IAS/Shunts: No atrial level shunt detected by color flow Doppler.  LEFT VENTRICLE PLAX 2D LVIDd:         4.40 cm LVIDs:         3.30 cm LV PW:         1.70 cm LV IVS:        2.00 cm LVOT diam:     2.10 cm LV SV:         65 LV SV Index:   26 LVOT Area:     3.46 cm  RIGHT VENTRICLE RV Basal diam:  5.60 cm RV Mid diam:    4.10 cm RV S prime:     13.20 cm/s TAPSE (M-mode): 2.3 cm LEFT ATRIUM         Index       RIGHT ATRIUM           Index LA diam:    4.80 cm 1.91 cm/m  RA Area:     31.50 cm                                  RA Volume:   131.00 ml 52.08 ml/m  AORTIC VALVE                    PULMONIC VALVE AV Area (Vmax):    3.14 cm     PV Vmax:       0.98 m/s AV Area (Vmean):   2.94 cm     PV Peak grad:  3.8 mmHg AV Area (VTI):     2.82 cm AV Vmax:           130.00 cm/s AV Vmean:          81.000 cm/s AV VTI:            0.231 m AV Peak Grad:      6.8 mmHg AV Mean Grad:      3.0 mmHg LVOT Vmax:         118.00 cm/s LVOT Vmean:        68.800 cm/s LVOT VTI:          0.188 m LVOT/AV VTI ratio: 0.81  AORTA Ao Root diam: 3.40 cm MITRAL VALVE  TRICUSPID VALVE MV Area (PHT): 2.53 cm    TR Peak grad:   28.5 mmHg MV Area VTI:   2.79 cm    TR Vmax:        267.00 cm/s MV Peak grad:  1.8 mmHg MV Mean grad:  1.0 mmHg    SHUNTS MV Vmax:       0.67 m/s    Systemic VTI:  0.19 m MV Vmean:      41.7 cm/s   Systemic Diam: 2.10 cm MV Decel Time: 300 msec MV E velocity: 56.60 cm/s MV A velocity: 57.80 cm/s MV E/A ratio:  0.98 Dwayne Salome Arnt MD Electronically signed by Alwyn Pea MD Signature Date/Time: 03/21/2023/4:48:17 PM    Final    DG Chest Port 1 View Addendum Date: 03/21/2023 ADDENDUM REPORT: 03/21/2023 11:59 ADDENDUM: Study discussed by telephone with Dr. Aundria Rud on 03/21/2023 at 1156 hours. Electronically Signed   By: Odessa Fleming M.D.   On: 03/21/2023 11:59   Result Date: 03/21/2023 CLINICAL DATA:  48 year old male with respiratory failure. Intubated. Enteric tube placement. EXAM: PORTABLE CHEST 1 VIEW COMPARISON:  Portable abdomen today. Portable chest 0039 hours today. FINDINGS: Portable AP upright view at 1048 hours. The enteric feeding tube loops at the level of the hypopharynx and does not continue distally. Stable endotracheal tube, tip in good position between the level the clavicles and carina. Stable enlarged cardiac silhouette and bibasilar pulmonary hypo ventilation. No superimposed pneumothorax or pulmonary edema. Paucity of bowel gas. Stable visualized osseous structures. IMPRESSION: 1. Enteric feeding tube loops  at the hypopharynx and does not continue distally. This should be removed and re-attempted. 2. Satisfactory ET tube. Stable bibasilar hypo ventilation, evidence of cardiomegaly. Electronically Signed: By: Odessa Fleming M.D. On: 03/21/2023 11:46   DG Abd 1 View Result Date: 03/21/2023 CLINICAL DATA:  48 year old male NG tube. EXAM: ABDOMEN - 1 VIEW COMPARISON:  05/21/2021. FINDINGS: 2 AP supine views at 1044 hours with no enteric tube identified in the lower chest or visible abdomen. Paucity of bowel gas. EKG leads and wires. IMPRESSION: No enteric tube visible at the diaphragm or in the upper abdomen. See portable chest x-ray 1048 hours reported separately. Electronically Signed   By: Odessa Fleming M.D.   On: 03/21/2023 11:44   [place] EKG SITE RITE Result Date: 03/21/2023 If Site Rite image not attached, placement could not be confirmed due to current cardiac rhythm.  DG Chest Portable 1 View Result Date: 03/21/2023 CLINICAL DATA:  Intubation EXAM: PORTABLE CHEST 1 VIEW COMPARISON:  03/20/2023 FINDINGS: Endotracheal tube tip is just below the level of the clavicular heads. Unchanged cardiomegaly and pulmonary vascular congestion. IMPRESSION: Endotracheal tube tip just below the level of the clavicular heads. Electronically Signed   By: Deatra Robinson M.D.   On: 03/21/2023 01:22   DG Chest 2 View Result Date: 03/20/2023 CLINICAL DATA:  Low O2 sats, weight gain. EXAM: CHEST - 2 VIEW COMPARISON:  05/25/2021 FINDINGS: Cardiomegaly, vascular congestion. No overt edema, confluent opacities or effusions. No acute bony abnormality. IMPRESSION: Cardiomegaly, vascular congestion. Electronically Signed   By: Charlett Nose M.D.   On: 03/20/2023 11:21    Microbiology: Results for orders placed or performed during the hospital encounter of 03/20/23  Resp panel by RT-PCR (RSV, Flu A&B, Covid) Anterior Nasal Swab     Status: None   Collection Time: 03/20/23 11:29 AM   Specimen: Anterior Nasal Swab  Result Value Ref Range Status    SARS Coronavirus 2 by RT  PCR NEGATIVE NEGATIVE Final    Comment: (NOTE) SARS-CoV-2 target nucleic acids are NOT DETECTED.  The SARS-CoV-2 RNA is generally detectable in upper respiratory specimens during the acute phase of infection. The lowest concentration of SARS-CoV-2 viral copies this assay can detect is 138 copies/mL. A negative result does not preclude SARS-Cov-2 infection and should not be used as the sole basis for treatment or other patient management decisions. A negative result may occur with  improper specimen collection/handling, submission of specimen other than nasopharyngeal swab, presence of viral mutation(s) within the areas targeted by this assay, and inadequate number of viral copies(<138 copies/mL). A negative result must be combined with clinical observations, patient history, and epidemiological information. The expected result is Negative.  Fact Sheet for Patients:  BloggerCourse.com  Fact Sheet for Healthcare Providers:  SeriousBroker.it  This test is no t yet approved or cleared by the [place] FDA and  has been authorized for detection and/or diagnosis of SARS-CoV-2 by FDA under an Emergency Use Authorization (EUA). This EUA will remain  in effect (meaning this test can be used) for the duration of the COVID-19 declaration under Section 564(b)(1) of the Act, 21 U.S.C.section 360bbb-3(b)(1), unless the authorization is terminated  or revoked sooner.       Influenza A by PCR NEGATIVE NEGATIVE Final   Influenza B by PCR NEGATIVE NEGATIVE Final    Comment: (NOTE) The Xpert Xpress SARS-CoV-2/FLU/RSV plus assay is intended as an aid in the diagnosis of influenza from Nasopharyngeal swab specimens and should not be used as a sole basis for treatment. Nasal washings and aspirates are unacceptable for Xpert Xpress SARS-CoV-2/FLU/RSV testing.  Fact Sheet for  Patients: BloggerCourse.com  Fact Sheet for Healthcare Providers: SeriousBroker.it  This test is not yet approved or cleared by the [place] FDA and has been authorized for detection and/or diagnosis of SARS-CoV-2 by FDA under an Emergency Use Authorization (EUA). This EUA will remain in effect (meaning this test can be used) for the duration of the COVID-19 declaration under Section 564(b)(1) of the Act, 21 U.S.C. section 360bbb-3(b)(1), unless the authorization is terminated or revoked.     Resp Syncytial Virus by PCR NEGATIVE NEGATIVE Final    Comment: (NOTE) Fact Sheet for Patients: BloggerCourse.com  Fact Sheet for Healthcare Providers: SeriousBroker.it  This test is not yet approved or cleared by the [place] FDA and has been authorized for detection and/or diagnosis of SARS-CoV-2 by FDA under an Emergency Use Authorization (EUA). This EUA will remain in effect (meaning this test can be used) for the duration of the COVID-19 declaration under Section 564(b)(1) of the Act, 21 U.S.C. section 360bbb-3(b)(1), unless the authorization is terminated or revoked.  Performed at [facility], [address] [place], [place] [address]   MRSA Next Gen by PCR, Nasal     Status: None   Collection Time: 03/21/23  1:57 AM   Specimen: Nasal Mucosa; Nasal Swab  Result Value Ref Range Status   MRSA by PCR Next Gen NOT DETECTED NOT DETECTED Final    Comment: (NOTE) The GeneXpert MRSA Assay (FDA approved for NASAL specimens only), is one component of a comprehensive MRSA colonization surveillance program. It is not intended to diagnose MRSA infection nor to guide or monitor treatment for MRSA infections. Test performance is not FDA approved in patients less than 84 years old. Performed at [facility], [address] [place], [place] [address]   Culture,  Respiratory w Gram Stain     Status:  None   Collection Time: 03/21/23  7:30 PM   Specimen: Tracheal Aspirate; Respiratory  Result Value Ref Range Status   Specimen Description   Final    TRACHEAL ASPIRATE Performed at [facility], [address] [place], [place] [address]    Special Requests   Final    NONE Performed at [facility], [address] [place], [place] [address]    Gram Stain   Final    FEW WBC PRESENT,BOTH PMN AND MONONUCLEAR FEW GRAM POSITIVE COCCI IN PAIRS AND CHAINS    Culture   Final    RARE Normal respiratory flora-no Staph aureus or Pseudomonas seen Performed at [facility], [address], [place], [place] [address]    Report Status 03/24/2023 FINAL  Final  Culture, Respiratory w Gram Stain     Status: None   Collection Time: 03/26/23  7:25 AM   Specimen: Tracheal Aspirate; Respiratory  Result Value Ref Range Status   Specimen Description   Final    TRACHEAL ASPIRATE Performed at [facility], [address], [place], [place] [address]    Special Requests   Final    NONE Performed at [facility], [address] [place], [place] [address]    Gram Stain   Final    ABUNDANT WBC PRESENT, PREDOMINANTLY PMN NO ORGANISMS SEEN    Culture   Final    RARE METHICILLIN RESISTANT STAPHYLOCOCCUS AUREUS FEW CORYNEBACTERIUM STRIATUM Standardized susceptibility testing for this organism is not available. Performed at [facility], [address] [place], [place] [address]    Report Status 03/29/2023 FINAL  Final   Organism ID, Bacteria METHICILLIN RESISTANT STAPHYLOCOCCUS AUREUS  Final      Susceptibility   Methicillin resistant staphylococcus aureus - MIC*    CIPROFLOXACIN >=8 RESISTANT Resistant     ERYTHROMYCIN >=8 RESISTANT Resistant     GENTAMICIN <=0.5 SENSITIVE Sensitive     OXACILLIN >=4 RESISTANT Resistant     TETRACYCLINE <=1 SENSITIVE Sensitive     VANCOMYCIN <=0.5 SENSITIVE Sensitive      TRIMETH/SULFA >=320 RESISTANT Resistant     CLINDAMYCIN <=0.25 SENSITIVE Sensitive     RIFAMPIN <=0.5 SENSITIVE Sensitive     Inducible Clindamycin NEGATIVE Sensitive     LINEZOLID 4 SENSITIVE Sensitive     * RARE METHICILLIN RESISTANT STAPHYLOCOCCUS AUREUS  Expectorated Sputum Assessment w Gram Stain, Rflx to Resp Cult     Status: None   Collection Time: 03/27/23  8:36 AM   Specimen: Sputum  Result Value Ref Range Status   Specimen Description SPUTUM  Final   Special Requests  TASP  Final   Sputum evaluation   Final    THIS SPECIMEN IS ACCEPTABLE FOR SPUTUM CULTURE Performed at [facility], [address] [place], [place] [address]    Report Status 03/27/2023 FINAL  Final  Culture, Respiratory w Gram Stain     Status: None   Collection Time: 03/27/23  8:36 AM   Specimen: SPU  Result Value Ref Range Status   Specimen Description   Final    SPUTUM Performed at [facility], [address] [place], [place] [address]    Special Requests   Final     TASP Reflexed from [phone] Performed at [facility], [address] [place], [place] [address]    Gram Stain   Final    FEW WBC PRESENT, PREDOMINANTLY PMN NO ORGANISMS SEEN    Culture  Final    Normal respiratory flora-no Staph aureus or Pseudomonas seen Performed at [facility], [address] [place], [place] [address]    Report Status 03/30/2023 FINAL  Final  Culture, blood (Routine X 2) w Reflex to ID Panel     Status: None   Collection Time: 03/27/23 12:53 PM   Specimen: BLOOD  Result Value Ref Range Status   Specimen Description BLOOD BLOOD LEFT HAND  Final   Special Requests   Final    BOTTLES DRAWN AEROBIC AND ANAEROBIC Blood Culture adequate volume   Culture   Final    NO GROWTH 5 DAYS Performed at [facility], [address] [place], [place] [address]    Report Status 04/01/2023 FINAL  Final  Culture, blood (Routine X 2) w Reflex to ID Panel     Status: None    Collection Time: 03/27/23 12:59 PM   Specimen: BLOOD  Result Value Ref Range Status   Specimen Description BLOOD BLOOD RIGHT HAND  Final   Special Requests   Final    BOTTLES DRAWN AEROBIC AND ANAEROBIC Blood Culture results may not be optimal due to an inadequate volume of blood received in culture bottles   Culture   Final    NO GROWTH 5 DAYS Performed at [facility], [address] [place], [place] [address]    Report Status 04/01/2023 FINAL  Final    Labs: CBC: Recent Labs  Lab 03/28/23 0340 03/29/23 0521 03/31/23 1327 03/31/23 1329  WBC 9.4 6.5  --   --   HGB 19.3* 19.4* 19.7* 21.8*  HCT 62.7* 63.5* 58.0* 64.0*  MCV 94.3 94.8  --   --   PLT 153 143*  --   --    Basic Metabolic Panel: Recent Labs  Lab 03/27/23 1707 03/28/23 0504 03/29/23 0828 03/30/23 0631 03/31/23 0639 03/31/23 1327 03/31/23 1329 04/01/23 0439 04/02/23 0319 04/03/23 0341  NA 146* 143   < > 143 138 147* 142 139 140 136  K 4.0 4.1   < > 4.3 3.5 2.6* 3.4* 3.7 3.4* 3.8  CL 106 102   < > 104 99  --   --  100 96* 96*  CO2 29 28   < > 29 29  --   --  29 31 30   GLUCOSE 141* 151*   < > 108* 110*  --   --  95 110* 95  BUN 38* 41*   < > 43* 48*  --   --  47* 46* 34*  CREATININE 1.46* 1.53*   < > 1.42* 1.52*  --   --  1.44* 1.38* 1.11  CALCIUM 8.5* 9.1   < > 9.2 9.0  --   --  9.1 9.3 9.2  MG 2.5* 2.8*  --   --   --   --   --   --   --   --   PHOS  --  2.8  --   --   --   --   --   --   --   --    < > = values in this interval not displayed.   Liver Function Tests: No results for input(s): "AST", "ALT", "ALKPHOS", "BILITOT", "PROT", "ALBUMIN" in the last 168 hours. CBG: Recent Labs  Lab 04/02/23 1716 04/02/23 1959 04/02/23 2054 04/03/23 0045 04/03/23 0746  GLUCAP 129* 199* 150* 94 132*    Discharge time spent: greater than 30 minutes.  Signed: Lurene Shadow,  MD [facility] 04/03/2023

## 2023-04-04 ENCOUNTER — Telehealth: Payer: Self-pay | Admitting: Cardiology

## 2023-04-04 NOTE — Telephone Encounter (Signed)
 Pt confirmed appt for 04/05/23

## 2023-04-05 ENCOUNTER — Ambulatory Visit: Payer: Medicaid Other | Attending: Cardiology | Admitting: Cardiology

## 2023-04-05 ENCOUNTER — Other Ambulatory Visit: Payer: Self-pay

## 2023-04-05 ENCOUNTER — Encounter: Payer: Self-pay | Admitting: Cardiology

## 2023-04-05 VITALS — BP 91/52 | HR 92 | Wt 274.0 lb

## 2023-04-05 DIAGNOSIS — I5042 Chronic combined systolic (congestive) and diastolic (congestive) heart failure: Secondary | ICD-10-CM

## 2023-04-05 DIAGNOSIS — N1831 Chronic kidney disease, stage 3a: Secondary | ICD-10-CM | POA: Diagnosis not present

## 2023-04-05 DIAGNOSIS — E1122 Type 2 diabetes mellitus with diabetic chronic kidney disease: Secondary | ICD-10-CM

## 2023-04-05 DIAGNOSIS — I4892 Unspecified atrial flutter: Secondary | ICD-10-CM | POA: Diagnosis not present

## 2023-04-05 MED ORDER — SACUBITRIL-VALSARTAN 24-26 MG PO TABS
ORAL_TABLET | ORAL | 1 refills | Status: DC
  Filled 2023-04-05: qty 45, 45d supply, fill #0

## 2023-04-05 NOTE — Patient Instructions (Signed)
 Medication Changes:  DECREASE Entresto to 1/2 (half) tablet two times daily. New Prescription sent to your pharmacy.   Special Instructions // Education:  Do the following things EVERYDAY: Weigh yourself in the morning before breakfast. Write it down and keep it in a log. Take your medicines as prescribed Eat low salt foods--Limit salt (sodium) to 2000 mg per day.  Stay as active as you can everyday Limit all fluids for the day to less than 2 liters   Follow-Up in: 2 weeks for a pharmacy appointment with Enos Fling, and  2 months with Dr. Shirlee Latch. Please call the office to schedule this appointment in March.    If you have any questions or concerns before your next appointment please send [place] a message through [place] or call our office at [phone] Monday-Friday 8 am-5 pm.   If you have an urgent need after hours on the weekend please call your Primary Cardiologist or the Advanced Heart Failure Clinic in [place] at [phone].   At the Advanced Heart Failure Clinic, you and your health needs are our priority. We have a designated team specialized in the treatment of Heart Failure. This Care Team includes your primary Heart Failure Specialized Cardiologist (physician), Advanced Practice Providers (APPs- Physician Assistants and Nurse Practitioners), and Pharmacist who all work together to provide you with the care you need, when you need it.   You may see any of the following providers on your designated Care Team at your next follow up:  Dr. Arvilla Meres Dr. Marca Ancona Dr. Dorthula Nettles Dr. Theresia Bough Tonye Becket, NP Robbie Lis, [place] [address] [place], [place] Brynda Peon, NP [place] Lee, NP Clarisa Kindred, NP Enos Fling, PharmD

## 2023-04-05 NOTE — Progress Notes (Signed)
 ADVANCED HEART FAILURE FOLLOW UP CLINIC NOTE  Referring Physician: No ref. provider found  Primary Care: Pcp, No Primary Cardiologist:  HPI: Gregory Mckenzie is a 48 y.o. male who presents for follow up of chronic biventricular heart failure.      Admitted to the hospital in 03/2020 with volume overload and uncontrolled hypertension in the 180s over 100s. Echo during that admission demonstrated an EF of 40 to 45%, global hypokinesis, severe concentric LVH, grade 2 diastolic dysfunction, normal PASP, severe biatrial enlargement, trivial mitral regurgitation, and an estimated right atrial pressure of 8 mmHg.  L  He was diuresed and started on GDMT with symptomatic improvement. He was also noted to have a brief episode of paroxysmal atrial flutter and was initiated on Xarelto. He converted to sinus rhythm during the admission    He was lost to follow-up until readmission in 04/2021 with acute on chronic combined CHF.  High-sensitivity troponin peaking at 67.  BNP 990.  He was diuresed 26 L with subsequent improvement in symptoms .  With diuresis, BNP improved to 380. Hospital course was subsequently complicated by acute respiratory distress requiring mechanical ventilation with concern for HAP with improvement following antibiotic therapy.   HTN runs in his family, father and brother had severe hypertension. Brother had a pulmonary embolism at age 28. Thinks his brother also had "thick blood" polycythemia.   Patient presented to the hospital 03/20/2023 with acute on chronic hypoxic and hypercapnic respiratory failure.  Was aggressively diuresed, but required intubation and vasopressor use due to RV dysfunction and severe volume overload.  Discharged with a weight of 273 pounds     SUBJECTIVE:  Still awaiting his CPAP titration unfortunately.  Otherwise states that he is doing fairly well, denies any lightheadedness, dizziness, presyncopal events despite his low blood pressure.  His weight has  been stable since discharge as well.  Discussed GLP-1's and patient would like to talk to pharmacy.  PMH, current medications, allergies, social history, and family history reviewed in epic.  PHYSICAL EXAM: Vitals:   04/05/23 1135  BP: (!) 91/52  Pulse: 92  SpO2: 91%   GENERAL: obese, chronically ill appearing PULM:  Normal work of breathing, clear to auscultation bilaterally. Respirations are unlabored.  CARDIAC:  JVP: Difficult to visualize         Normal rate with regular rhythm. No murmurs, rubs or gallops.  1+ LE edema. Warm and well perfused extremities. ABDOMEN: Soft, non-tender, non-distended. NEUROLOGIC: Patient is oriented x3 with no focal or lateralizing neurologic deficits.    DATA REVIEW   ECHO: Echo 05/15/21: EF of 40-45% along with mild LVH, moderate LAE, mild MR & moderate/severe TR.  Echo 10/20/22: EF 55-60% with severe LVH, normal PA pressure of 23.1 mmHg   CATH: Prox RCA lesion is 30% stenosed. Mid RCA lesion is 30% stenosed. Ost LAD lesion is 30% stenosed. Prox LAD lesion is 30% stenosed.  1. Mildly elevated filling pressures.  2. Mild pulmonary venous hypertension.  3. Nonobstructive CAD.   Nonischemic cardiomyopathy.    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 Recent echo showed EF 45-50%, severe LVH, moderate RV enlargement and moderate RV dysfunction, PASP 44 mmHg. BP low today, given RV dysfunction will decrease entresto to 1/2 pill 24/26. Follow up with pharmacy to retitrate if improvement at next visit. - Volume status appears optimized - Continue Torsemide 40 mg daily.  -  BMP at next visit.  - Decrease Entresto to 1/2 tablet 24-26 mg BID - Continue Jardiance 10 mg daily - Continue coreg 6.25 mg BID - Continue spironolactone 12.5 mg daily   Chronic hypoxemic/hypercarbic respiratory failure: In setting of OHS/OSA (on home  oxygen 3-4 L, has not had CPAP for 6 months)  - Improved with diuresis - suspect advanced OSA/OHS - Will message pulmonary to move up his sleep appointment given severe OSA    CKD stage 3a: Creatinine at baseline from recent hospital labs - SCr baseline (1.4-1.6).     Polycythemia: Likely due to chronic hypoxemia.    Atrial fibrillation:  -this is paroxysmal  -Currently maintaining normal sinus rhyth -Continue Xarelto. No bleeding    DM2: follow up with PCP  Morbid obesity: Consider GLP-1 given OSA, RV dysfunction, morbid obesity, CAD. Will arrange pharmacy visit to discuss.  Follow up in 2 weeks with [place], 2 months with Dr. Helen Hashimoto, MD Advanced Heart Failure Mechanical Circulatory Support 04/05/23

## 2023-04-11 ENCOUNTER — Telehealth: Payer: Self-pay | Admitting: *Deleted

## 2023-04-11 NOTE — Telephone Encounter (Signed)
 Routing to PCC's to see if the sleep study appt can be moved up. Thanks!

## 2023-04-11 NOTE — Telephone Encounter (Signed)
-----   Message from Nurse Alana H sent at 04/05/2023 12:11 PM EST ----- Regarding: MD requesting earlier sleep study appointment Hi,  I'm a nurse at at the [facility]. This mutual pt was seen in clinic today and Dr. Elwyn Lade requested that we reach out to you guys to see if the patient can get a sooner appointment for his sleep study because of the severity of his symptoms and recent hospitalization related to the issue. I tried to call, but you guys have closed for the inclement weather, and I won't be working again this week.  Thanks so much for any help you can offer him!

## 2023-04-16 ENCOUNTER — Ambulatory Visit (HOSPITAL_BASED_OUTPATIENT_CLINIC_OR_DEPARTMENT_OTHER): Payer: Medicaid Other | Attending: Pulmonary Disease | Admitting: Pulmonary Disease

## 2023-04-17 ENCOUNTER — Telehealth: Payer: Self-pay

## 2023-04-17 ENCOUNTER — Telehealth: Payer: Self-pay | Admitting: Pharmacist

## 2023-04-17 ENCOUNTER — Other Ambulatory Visit (HOSPITAL_COMMUNITY): Payer: Self-pay

## 2023-04-17 NOTE — Progress Notes (Unsigned)
 Advanced Heart Failure Clinic Note  PCP: Pcp, No PCP-Cardiologist: Yvonne Kendall, MD HF-Cardiologist: Marca Ancona, MD  HPI:  Gregory Mckenzie is a 48 y.o. male who presents for follow up of chronic biventricular heart failure.   Admitted to the hospital in 03/2020 with volume overload and uncontrolled hypertension in the 180s over 100s. Echo during that admission demonstrated an EF of 40 to 45%, global hypokinesis, severe concentric LVH, grade 2 diastolic dysfunction, normal PASP, severe biatrial enlargement, trivial mitral regurgitation, and an estimated right atrial pressure of 8 mmHg.  L  He was diuresed and started on GDMT with symptomatic improvement. He was also noted to have a brief episode of paroxysmal atrial flutter and was initiated on Xarelto. He converted to sinus rhythm during the admission    He was lost to follow-up until readmission in 04/2021 with acute on chronic combined CHF.  High-sensitivity troponin peaking at 67.  BNP 990.  He was diuresed 26 L with subsequent improvement in symptoms .  With diuresis, BNP improved to 380. Hospital course was subsequently complicated by acute respiratory distress requiring mechanical ventilation with concern for HAP with improvement following antibiotic therapy.   HTN runs in his family, father and brother had severe hypertension. Brother had a pulmonary embolism at age 53. Thinks his brother also had "thick blood" polycythemia.    Patient presented to the hospital 03/20/2023 with acute on chronic hypoxic and hypercapnic respiratory failure.  Was aggressively diuresed, but required intubation and vasopressor use due to RV dysfunction and severe volume overload.  Discharged with a weight of 273 pounds  Seen by Dr. Elwyn Lade on 04/05/23. [place] was feeling well, but asymptomatically hypotensive. Entresto 24-26 mg was decreased to 1/2 tablet BID.  Today Gregory Mckenzie returns to Heart Failure Clinic for pharmacist medication  titration. Reports feeling good. Reports some shortness of breath going up stairs or with long vigerous walks. Denies dizziness lightheadedness fatigue chest pain palpitations LEE orthopnea orthostasis PND. Reports being able to complete all activities of daily living (ADLs). Is somewhat active throughout the day. Weight at home is ~270 pounds. Takes torsemide 40 mg daily. Appetite is good. Does follow a low sodium diet. Reports successfully decreasing Entresto to 1/2 tablet BID, but reports he has been taking a whole tablet of spironolactone since hospital discharge.  Current Heart Failure Medications: Loop diuretic: torsemide 40 mg daily Beta-Blocker: carvedilol 6.25 mg BID ACEI/ARB/ARNI: Entresto 24-26 mg 1/2 tablet BID MRA: spironolactone 25 mg daily (prescribed 12.5 but believes he is taking 25 mg) SGLT2i: Jardiance 10 mg daily  Has the patient been experiencing any side effects to the medications prescribed? No  Does the patient have any problems obtaining medications due to transportation or finances? No  Understanding of regimen: Excellent  Understanding of indications: Excellent  Potential of adherence: Excellent, except the patient reports taking a whole tablet of spironolactone 25 mg daily rather than a half tablet.  Patient understands to avoid NSAIDs.  Patient understands to avoid decongestants.  Pertinent Lab Values: Creatinine, Ser  Date Value Ref Range Status  04/03/2023 1.11 0.61 - 1.24 mg/dL Final   BUN  Date Value Ref Range Status  04/03/2023 34 (H) 6 - 20 mg/dL Final  14/78/2956 21 6 - 24 mg/dL Final   Potassium  Date Value Ref Range Status  04/03/2023 3.8 3.5 - 5.1 mmol/L Final   Sodium  Date Value Ref Range Status  04/03/2023 136 135 - 145 mmol/L Final  10/26/2022 146 (H) 134 - 144  mmol/L Final   B Natriuretic Peptide  Date Value Ref Range Status  03/20/2023 1,596.6 (H) 0.0 - 100.0 pg/mL Final    Comment:    Performed at [facility], [address] [place], [place] [address]   Magnesium  Date Value Ref Range Status  03/28/2023 2.8 (H) 1.7 - 2.4 mg/dL Final    Comment:    Performed at [facility], [address] [place], [place] [address]   TSH  Date Value Ref Range Status  05/20/2021 0.758 0.350 - 4.500 uIU/mL Final    Comment:    Performed by a 3rd Generation assay with a functional sensitivity of <=0.01 uIU/mL. Performed at [facility], [address], [place], [place] [address]    Vital Signs: Today's Vitals   04/19/23 1304  BP: 108/84  Pulse: 93  SpO2: 93%  Weight: 276 lb 12.8 oz (125.6 kg)    Data Review:  ECHO: Echo 05/15/21: EF of 40-45% along with mild LVH, moderate LAE, mild MR & moderate/severe TR.  Echo 10/20/22: EF 55-60% with severe LVH, normal PA pressure of 23.1 mmHg    CATH: Prox RCA lesion is 30% stenosed. Mid RCA lesion is 30% stenosed. Ost LAD lesion is 30% stenosed. Prox LAD lesion is 30% stenosed.  1. Mildly elevated filling pressures.  2. Mild pulmonary venous hypertension.  3. Nonobstructive CAD.   Nonischemic cardiomyopathy.    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 Recent echo showed EF 45-50%, severe LVH, moderate RV enlargement and moderate RV dysfunction, PASP 44 mmHg. BP stable today.  - Volume status appears stable. Weight is up 2 lbs from last visit. Doing fine on current regimen. Minimal LEE edema. - Continue Torsemide 40 mg daily.  - Increase Entresto back to 1 tablet 24-26 mg BID given BP is stable with no dizziness. If BP decreases significantly again, would transition to losartan. Despite HFpEF, would still benefit from ARB for renal protection in DM. BMET today and in 10 days. - Continue Jardiance 10 mg daily - Continue carvedilol 6.25 mg BID - Continue spironolactone 25 mg daily. Patient reports he has been taking a whole  tablet since hospital discharge. K was 3.8 two weeks prior and creatinine was stable.    Chronic hypoxemic/hypercarbic respiratory failure: In setting of OHS/OSA (on home oxygen 3-4 L, has not had CPAP for 6 months)  - Improved with diuresis - suspect advanced OSA/OHS - Sleep appointment scheduled this month. Current O2 machine is not working.    CKD stage 3a: Creatinine at baseline from recent hospital labs - SCr baseline (1.4-1.6).     Polycythemia: Likely due to chronic hypoxemia.    Atrial fibrillation:  -This is paroxysmal  -Currently maintaining normal sinus rhyth -Continue Xarelto. No bleeding    DM2: follow up with PCP   Morbid obesity: Add GLP-1 given OSA, RV dysfunction, morbid obesity, CAD.  -Denies family history of medullary thyroid cancer -Start Ozempic 0.25 mg subcutaneously once weekly. Education provided.  Follow up: BMET in 10 days. F/u with Dr. Shirlee Latch in April per Dr. Lavonia Dana previous note.  Please do not hesitate to reach out with questions or concerns,  Enos Fling, PharmD, CPP, BCPS Heart Failure Pharmacist  Phone - [phone] 04/19/2023 1:55 PM

## 2023-04-17 NOTE — Telephone Encounter (Signed)
 Patient Advocate Encounter  Test billing for GLP1 options returned the following results:  Not Covered: Ronni Rumble Requires Prior Auth: Cathey Endow   Test claim for [facility] 15/16 MG shows medication not covered, but does indicate that a prior auth may be approved.  Burnell Blanks, CPhT Rx Patient Advocate Phone: [phone]

## 2023-04-17 NOTE — Telephone Encounter (Signed)
 Per patient advocate GLP-1RA copay checks show: Not Covered: Gregory Mckenzie  Requires Prior Auth: Gregory Mckenzie   Prior authroziation submitted for [employer]ica [employer]on) - [id number]

## 2023-04-18 ENCOUNTER — Telehealth: Payer: Self-pay | Admitting: Pharmacist

## 2023-04-18 ENCOUNTER — Other Ambulatory Visit: Payer: Self-pay

## 2023-04-18 NOTE — Telephone Encounter (Signed)
 Pt confirmed appt for 04/19/23

## 2023-04-19 ENCOUNTER — Other Ambulatory Visit: Payer: Self-pay

## 2023-04-19 ENCOUNTER — Telehealth: Payer: Self-pay

## 2023-04-19 ENCOUNTER — Other Ambulatory Visit
Admission: RE | Admit: 2023-04-19 | Discharge: 2023-04-19 | Disposition: A | Discharge status: Home / Self Care | Source: Ambulatory Visit | Attending: Cardiology | Admitting: Cardiology

## 2023-04-19 ENCOUNTER — Other Ambulatory Visit (HOSPITAL_COMMUNITY): Payer: Self-pay

## 2023-04-19 ENCOUNTER — Ambulatory Visit: Payer: Medicaid Other | Admitting: Pharmacist

## 2023-04-19 VITALS — BP 108/84 | HR 93 | Wt 276.8 lb

## 2023-04-19 DIAGNOSIS — I5042 Chronic combined systolic (congestive) and diastolic (congestive) heart failure: Secondary | ICD-10-CM

## 2023-04-19 LAB — BASIC METABOLIC PANEL
Anion gap: 11 (ref 5–15)
BUN: 25 mg/dL — ABNORMAL HIGH (ref 6–20)
CO2: 28 mmol/L (ref 22–32)
Calcium: 9.6 mg/dL (ref 8.9–10.3)
Chloride: 99 mmol/L (ref 98–111)
Creatinine, Ser: 1.18 mg/dL (ref 0.61–1.24)
GFR, Estimated: >60 mL/min (ref >60)
Glucose, Bld: 98 mg/dL (ref 70–99)
Potassium: 3.9 mmol/L (ref 3.5–5.1)
Sodium: 138 mmol/L (ref 135–145)

## 2023-04-19 MED ORDER — SEMAGLUTIDE(0.25 OR 0.5MG/DOS) 2 MG/3ML ~~LOC~~ SOPN
0.2500 mg | PEN_INJECTOR | SUBCUTANEOUS | 1 refills | Status: DC
  Filled 2023-04-19: qty 3, 56d supply, fill #0

## 2023-04-19 MED ORDER — SACUBITRIL-VALSARTAN 24-26 MG PO TABS
1.0000 | ORAL_TABLET | Freq: Two times a day (BID) | ORAL | 1 refills | Status: DC
  Filled 2023-04-19: qty 90, 45d supply, fill #0

## 2023-04-19 MED ORDER — SACUBITRIL-VALSARTAN 24-26 MG PO TABS
1.0000 | ORAL_TABLET | Freq: Two times a day (BID) | ORAL | 3 refills | Status: DC
Start: 2023-04-19 — End: 2023-05-17
  Filled 2023-04-19 – 2023-05-10 (×2): qty 60, 30d supply, fill #0

## 2023-04-19 MED ORDER — SPIRONOLACTONE 25 MG PO TABS: 25.0000 mg | ORAL_TABLET | Freq: Every day | ORAL | Status: DC

## 2023-04-19 NOTE — Addendum Note (Signed)
 Addended by: Marygrace Drought on: 04/19/2023 02:01 PM   Modules accepted: Orders

## 2023-04-19 NOTE — Telephone Encounter (Signed)
 Advanced Heart Failure Patient Advocate Encounter  Prior authorization for Ozempic was approved. Test billing shows a copay of $4 for 28 days, 84 day supply not covered under this plan.  Burnell Blanks, CPhT Rx Patient Advocate Phone: [phone]

## 2023-04-19 NOTE — Patient Instructions (Addendum)
 It was a pleasure seeing you today!  MEDICATIONS: -We are changing your medications today -Start Ozempic 0.25 mg injected under the skin once weekly -Continue spironolactone 25 mg daily as you are now. The prescription is updated. -Increase your Entresto back to 24-26 mg twice daily. Check your blood pressure at home. If you blood pressure is low or your feel dizzy/lightheaded, call the office to inform us.  -Call if you have questions about your medications.  LABS: -We will call you if your labs need attention.  NEXT APPOINTMENT: Return to clinic in April with Dr. Shirlee Latch. Call at the end of March to schedule.  In general, to take care of your heart failure: -Limit your fluid intake to 2 Liters (half-gallon) per day.   -Limit your salt intake to ideally 2-3 grams (2000-3000 mg) per day. -Weigh yourself daily and record, and bring that "weight diary" to your next appointment.  (Weight gain of 2-3 pounds in 1 day typically means fluid weight.) -The medications for your heart are to help your heart and help you live longer.   -Please contact us before stopping any of your heart medications.  Call the clinic at [phone] with questions or to reschedule future appointments.

## 2023-04-20 ENCOUNTER — Other Ambulatory Visit: Payer: Self-pay

## 2023-04-25 ENCOUNTER — Encounter: Payer: Self-pay | Admitting: Cardiology

## 2023-04-26 ENCOUNTER — Other Ambulatory Visit: Payer: Self-pay

## 2023-04-28 ENCOUNTER — Other Ambulatory Visit: Payer: Self-pay | Admitting: Family

## 2023-04-28 ENCOUNTER — Other Ambulatory Visit: Payer: Self-pay

## 2023-04-28 MED FILL — Carvedilol Tab 6.25 MG: ORAL | 30 days supply | Qty: 60 | Fill #4 | Status: AC

## 2023-05-01 ENCOUNTER — Other Ambulatory Visit: Payer: Self-pay

## 2023-05-01 MED FILL — Spironolactone Tab 25 MG: ORAL | 90 days supply | Qty: 90 | Fill #0 | Status: AC

## 2023-05-02 ENCOUNTER — Other Ambulatory Visit: Payer: Self-pay

## 2023-05-09 ENCOUNTER — Other Ambulatory Visit: Payer: Self-pay

## 2023-05-10 ENCOUNTER — Other Ambulatory Visit: Payer: Self-pay

## 2023-05-11 ENCOUNTER — Ambulatory Visit: Payer: Medicaid Other | Admitting: Pulmonary Disease

## 2023-05-11 ENCOUNTER — Encounter: Payer: Self-pay | Admitting: Pulmonary Disease

## 2023-05-11 VITALS — BP 115/73 | HR 88 | Ht 66.0 in | Wt 274.8 lb

## 2023-05-11 DIAGNOSIS — J9611 Chronic respiratory failure with hypoxia: Secondary | ICD-10-CM | POA: Diagnosis not present

## 2023-05-11 DIAGNOSIS — G4733 Obstructive sleep apnea (adult) (pediatric): Secondary | ICD-10-CM

## 2023-05-11 DIAGNOSIS — I272 Pulmonary hypertension, unspecified: Secondary | ICD-10-CM | POA: Diagnosis not present

## 2023-05-11 DIAGNOSIS — E66813 Obesity, class 3: Secondary | ICD-10-CM

## 2023-05-11 DIAGNOSIS — E119 Type 2 diabetes mellitus without complications: Secondary | ICD-10-CM

## 2023-05-11 DIAGNOSIS — I50810 Right heart failure, unspecified: Secondary | ICD-10-CM

## 2023-05-11 DIAGNOSIS — I5082 Biventricular heart failure: Secondary | ICD-10-CM

## 2023-05-11 DIAGNOSIS — R0602 Shortness of breath: Secondary | ICD-10-CM

## 2023-05-11 DIAGNOSIS — E662 Morbid (severe) obesity with alveolar hypoventilation: Secondary | ICD-10-CM

## 2023-05-11 DIAGNOSIS — I5041 Acute combined systolic (congestive) and diastolic (congestive) heart failure: Secondary | ICD-10-CM

## 2023-05-11 NOTE — Patient Instructions (Signed)
 Make sure you follow-up with the sleep study April 6  Continue regular exercises as tolerated  Hopefully with your weight coming down as well, things will continue to improve  Call us with significant concerns  I will see you close to the end of April to follow-up regarding the sleep issues

## 2023-05-11 NOTE — Progress Notes (Signed)
 Gregory Mckenzie    [id number]    01-Jun-1975  Primary Care Physician:Pcp, No  Referring Physician: No referring provider defined for this encounter.  Chief complaint:   Follow-up from recent hospitalization Being evaluated for sleep disordered breathing  HPI:  Was recently hospitalized for decompensated biventricular heart failure  He is doing relatively well at present Has started exercising regularly Does walk on a regular basis  Started on GLP-1-semaglutide, has lost a couple of pounds  He does have a history of obstructive sleep apnea, obesity hypoventilation -His sleep study is scheduled for April 6  Was using BiPAP previously, machine became dysfunctional about 8 months ago  He does have a history of chronic hypoxemic respiratory failure Used to use oxygen and his BiPAP  He is short of breath with most activities, he feels a little bit better since his recent hospitalization  Usually goes to bed about 930 wake up time about 72  Mother does have obstructive sleep apnea, also has a brother with obstructive sleep apnea  His previous machine was donated when he left the hospital in April 2023  Wakes up multiple times at night to use the restroom  He does have type 2 diabetes, chronic kidney disease, paroxysmal atrial flutter, combined systolic and diastolic dysfunction  Reformed smoker, quit in 2023    Outpatient Encounter Medications as of 05/11/2023  Medication Sig   atorvastatin (LIPITOR) 40 MG tablet Take 2 tablets (80 mg total) by mouth at bedtime.   carvedilol (COREG) 6.25 MG tablet Take 1 tablet (6.25 mg total) by mouth 2 (two) times daily with a meal.   empagliflozin (JARDIANCE) 10 MG TABS tablet Take 1 tablet (10 mg total) by mouth daily before breakfast.   OXYGEN Inhale 4 L into the lungs at bedtime.   rivaroxaban (XARELTO) 20 MG TABS tablet Take 1 tablet (20 mg total) by mouth daily with supper.   sacubitril-valsartan (ENTRESTO) 24-26  MG Take 1 tablet by mouth 2 (two) times daily.   Semaglutide,0.25 or 0.5MG /DOS, 2 MG/3ML SOPN Inject 0.25 mg into the skin once a week.   spironolactone (ALDACTONE) 25 MG tablet Take 1 tablet (25 mg total) by mouth once daily.   torsemide (DEMADEX) 20 MG tablet Take 2 tablets (40 mg total) by mouth daily.   No facility-administered encounter medications on file as of 05/11/2023.    Allergies as of 05/11/2023   (No Known Allergies)    Past Medical History:  Diagnosis Date   Atrial flutter (HCC)    a. CHA2DS2VASc = 4-->xarelto.   CAD (coronary artery disease)    a. 04/2020 Cath: LM nl, LAD 30ost/p, LCX nl, RCA 30p/m.   Chronic combined systolic and diastolic CHF (congestive heart failure) (HCC)    a. 03/2020 Echo: EF 40-45%; b. 05/2021 Echo: EF 40-45%; c. 10/2022 Echo: EF 55-60%, no rwma, sev LVH, RVSP 23.1 mmHg.   Diabetes mellitus without complication (HCC)    Hyperlipidemia    Hypertension    NICM (nonischemic cardiomyopathy) (HCC)    Nonadherence to medication    Obesity    Sleep apnea, obstructive     Past Surgical History:  Procedure Laterality Date   RIGHT HEART CATH N/A 03/31/2023   Procedure: RIGHT HEART CATH;  Surgeon: Dolores Patty, MD;  Location: [facility] INVASIVE CV LAB;  Service: Cardiovascular;  Laterality: N/A;  Heart Failure with mid-range ejection fraction   RIGHT/LEFT HEART CATH AND CORONARY ANGIOGRAPHY N/A 05/08/2020   Procedure: RIGHT/LEFT  HEART CATH AND CORONARY ANGIOGRAPHY;  Surgeon: Laurey Morale, MD;  Location: [facility] INVASIVE CV LAB;  Service: Cardiovascular;  Laterality: N/A;    Family History  Problem Relation Age of Onset   Hypertension Father    Hypertension Brother    Pulmonary embolism Brother     Social History   Socioeconomic History   Marital status: Single    Spouse name: Not on file   Number of children: Not on file   Years of education: Not on file   Highest education level: Not on file  Occupational History   Not on file  Tobacco  Use   Smoking status: Former    Current packs/day: 0.00    Average packs/day: 1 pack/day for 27.0 years (27.0 ttl pk-yrs)    Types: Cigarettes    Start date: 04/06/1993    Quit date: 04/06/2020    Years since quitting: 3.0   Smokeless tobacco: Never  Vaping Use   Vaping status: Never Used  Substance and Sexual Activity   Alcohol use: Not Currently   Drug use: Yes    Frequency: 7.0 times per week    Types: Marijuana    Comment: daily   Sexual activity: Yes    Partners: Female    Birth control/protection: None  Other Topics Concern   Not on file  Social History Narrative   Not on file   Social Drivers of Health   Financial Resource Strain: Low Risk  (04/09/2020)   Overall Financial Resource Strain (CARDIA)    Difficulty of Paying Living Expenses: Not very hard  Food Insecurity: Patient Unable To Answer (03/21/2023)   Hunger Vital Sign    Worried About Running Out of Food in the Last Year: Patient unable to answer    Ran Out of Food in the Last Year: Patient unable to answer  Transportation Needs: Patient Unable To Answer (03/21/2023)   PRAPARE - Transportation    Lack of Transportation (Medical): Patient unable to answer    Lack of Transportation (Non-Medical): Patient unable to answer  Physical Activity: Inactive (04/09/2020)   Exercise Vital Sign    Days of Exercise per Week: 0 days    Minutes of Exercise per Session: 0 min  Stress: Not on file  Social Connections: Not on file  Intimate Partner Violence: Patient Unable To Answer (03/21/2023)   Humiliation, Afraid, Rape, and Kick questionnaire    Fear of Current or Ex-Partner: Patient unable to answer    Emotionally Abused: Patient unable to answer    Physically Abused: Patient unable to answer    Sexually Abused: Patient unable to answer    Review of Systems  Constitutional:  Positive for fatigue.  Respiratory:  Positive for apnea and shortness of breath.   Psychiatric/Behavioral:  Positive for sleep disturbance.      Vitals:   05/11/23 1010  BP: 115/73  Pulse: 88  SpO2: 90%     Physical Exam Constitutional:      Appearance: He is obese.  HENT:     Head: Normocephalic.     Mouth/Throat:     Mouth: Mucous membranes are moist.  Eyes:     General: No scleral icterus. Cardiovascular:     Rate and Rhythm: Normal rate and regular rhythm.     Heart sounds: No murmur heard. Pulmonary:     Effort: No respiratory distress.     Breath sounds: No stridor. No wheezing or rhonchi.  Musculoskeletal:     Cervical back: No rigidity or tenderness.  Neurological:     Mental Status: He is alert.  Psychiatric:        Mood and Affect: Mood normal.    Data Reviewed: Hospitalization records from February 2025 reviewed -Treated for CHF exacerbation, non-ST elevation MI, acute on chronic kidney disease  Echocardiogram 10/20/2022 reviewed showing normal ejection fraction, severe left ventricular hypertrophy, indeterminate left ventricular diastolic parameters, no evidence of valvulopathy  Last chest x-ray on record April 2023-hypoventilated lung fields with atelectasis at the bases  Right heart catheterization 03/31/2023 shows PA pressures of 51/27 with a mean of 36, wedge pressure of 19 PVR of 3, 4 Wood units-moderate pulmonary hypertension, increased left-sided pressures  Assessment:   Recent hospitalization for respiratory failure Required to be on a ventilator -Since being home -Feels he is getting better  Patient with biventricular heart failure Moderate pulmonary hypertension History of severe obstructive sleep apnea, obesity hypoventilation -Not on treatment at present -Sleep study scheduled for April 6 -On GDMT  Chronic hypoxemic respiratory failure -Oxygen supplementation to keep saturations over 90%  Combined heart failure -Continues to follow-up with cardiology  History of atrial flutter  Obesity hypoventilation -Patient exercising regularly as tolerated  Type 2  diabetes  Class III obesity -On semaglutide  Plan/Recommendations:  Encouraged to make sure he follows up with the scheduled sleep study on April 6  Continue oxygen supplementation  Graded activities as tolerated  Compliance with medications  Encouraged to give [place] a call with significant concerns  He has multiple comorbidities with risk of worsening being very significant if sleep apnea is not treated  Will follow-up with patient and of April following a sleep study  Virl Diamond MD [facility] 05/11/2023, 10:19 AM  CC: No ref. provider found

## 2023-05-16 ENCOUNTER — Telehealth: Payer: Self-pay | Admitting: Pharmacist

## 2023-05-16 ENCOUNTER — Telehealth: Payer: Self-pay | Admitting: Family

## 2023-05-16 NOTE — Telephone Encounter (Incomplete)
 Called to confirm/remind patient of their appointment at the [facility] on 05/17/23***.   Appointment:   [] Confirmed  [] Left mess   [x] No answer/No voice mail  [] Phone not in service  Patient reminded to bring all medications and/or complete list.  Confirmed patient has transportation. Gave directions, instructed to utilize valet parking.

## 2023-05-16 NOTE — Telephone Encounter (Signed)
 Attempted to call patient to discuss GLP-1 RA therapy as he is due for dose titration. No response today and voicemail box is full. Will attempt again at a later date.

## 2023-05-17 ENCOUNTER — Other Ambulatory Visit: Payer: Self-pay

## 2023-05-17 ENCOUNTER — Ambulatory Visit: Attending: Cardiology | Admitting: Cardiology

## 2023-05-17 VITALS — BP 118/101 | HR 82 | Wt 272.0 lb

## 2023-05-17 DIAGNOSIS — Z7985 Long-term (current) use of injectable non-insulin antidiabetic drugs: Secondary | ICD-10-CM | POA: Insufficient documentation

## 2023-05-17 DIAGNOSIS — I130 Hypertensive heart and chronic kidney disease with heart failure and stage 1 through stage 4 chronic kidney disease, or unspecified chronic kidney disease: Secondary | ICD-10-CM | POA: Diagnosis not present

## 2023-05-17 DIAGNOSIS — Z7984 Long term (current) use of oral hypoglycemic drugs: Secondary | ICD-10-CM | POA: Insufficient documentation

## 2023-05-17 DIAGNOSIS — I5042 Chronic combined systolic (congestive) and diastolic (congestive) heart failure: Secondary | ICD-10-CM | POA: Diagnosis present

## 2023-05-17 DIAGNOSIS — I480 Paroxysmal atrial fibrillation: Secondary | ICD-10-CM | POA: Insufficient documentation

## 2023-05-17 DIAGNOSIS — E1122 Type 2 diabetes mellitus with diabetic chronic kidney disease: Secondary | ICD-10-CM | POA: Insufficient documentation

## 2023-05-17 DIAGNOSIS — I2510 Atherosclerotic heart disease of native coronary artery without angina pectoris: Secondary | ICD-10-CM | POA: Insufficient documentation

## 2023-05-17 DIAGNOSIS — E669 Obesity, unspecified: Secondary | ICD-10-CM | POA: Insufficient documentation

## 2023-05-17 DIAGNOSIS — Z9981 Dependence on supplemental oxygen: Secondary | ICD-10-CM | POA: Insufficient documentation

## 2023-05-17 DIAGNOSIS — D751 Secondary polycythemia: Secondary | ICD-10-CM | POA: Diagnosis not present

## 2023-05-17 DIAGNOSIS — Z6841 Body Mass Index (BMI) 40.0 and over, adult: Secondary | ICD-10-CM | POA: Diagnosis not present

## 2023-05-17 DIAGNOSIS — Z79899 Other long term (current) drug therapy: Secondary | ICD-10-CM | POA: Insufficient documentation

## 2023-05-17 DIAGNOSIS — N183 Chronic kidney disease, stage 3 unspecified: Secondary | ICD-10-CM | POA: Diagnosis not present

## 2023-05-17 DIAGNOSIS — Z7901 Long term (current) use of anticoagulants: Secondary | ICD-10-CM | POA: Insufficient documentation

## 2023-05-17 DIAGNOSIS — G4733 Obstructive sleep apnea (adult) (pediatric): Secondary | ICD-10-CM | POA: Diagnosis not present

## 2023-05-17 DIAGNOSIS — Z87891 Personal history of nicotine dependence: Secondary | ICD-10-CM | POA: Diagnosis not present

## 2023-05-17 MED ORDER — SEMAGLUTIDE(0.25 OR 0.5MG/DOS) 2 MG/3ML ~~LOC~~ SOPN
0.5000 mg | PEN_INJECTOR | SUBCUTANEOUS | 1 refills | Status: DC
  Filled 2023-05-17 – 2023-05-29 (×2): qty 3, 28d supply, fill #0
  Filled 2023-07-13: qty 3, 28d supply, fill #1

## 2023-05-17 MED ORDER — SACUBITRIL-VALSARTAN 49-51 MG PO TABS
1.0000 | ORAL_TABLET | Freq: Two times a day (BID) | ORAL | 6 refills | Status: DC
  Filled 2023-05-17: qty 60, 30d supply, fill #0
  Filled 2023-06-16 – 2023-06-23 (×2): qty 60, 30d supply, fill #1
  Filled 2023-07-23: qty 60, 30d supply, fill #2
  Filled 2023-08-24: qty 60, 30d supply, fill #3
  Filled 2023-09-25: qty 60, 30d supply, fill #4
  Filled 2023-10-30: qty 60, 30d supply, fill #5

## 2023-05-17 NOTE — Telephone Encounter (Signed)
 Patient seen by Dr. Shirlee Latch today and tolerating Ozempic well. Dose increased.

## 2023-05-17 NOTE — Patient Instructions (Signed)
 Medication Changes:  INCREASE Entresto 49-51mg  (1 tab) two times daily  Lab Work:  Go DOWN to LOWER LEVEL (LL) to have your blood work completed inside of [employer] office TODAY.  IN TWO WEEKS: Go over to the [facility]. Go pass the gift shop and have your blood work completed.  We will only call you if the results are abnormal or if the provider would like to make medication changes.   Follow-Up in: Please follow up with the [facility] in 1 month with Dr. Shirlee Latch.  At the [facility], you and your health needs are our priority. We have a designated team specialized in the treatment of Heart Failure. This Care Team includes your primary Heart Failure Specialized Cardiologist (physician), Advanced Practice Providers (APPs- Physician Assistants and Nurse Practitioners), and Pharmacist who all work together to provide you with the care you need, when you need it.   You may see any of the following providers on your designated Care Team at your next follow up:  Dr. Arvilla Meres Dr. Marca Ancona Dr. Dorthula Nettles Dr. Theresia Bough Clarisa Kindred, FNP Enos Fling, RPH-CPP  Please be sure to bring in all your medications bottles to every appointment.   Need to Contact us:  If you have any questions or concerns before your next appointment please send [place] a message through [place] or call our office at [phone].    TO LEAVE A MESSAGE FOR THE NURSE SELECT OPTION 2, PLEASE LEAVE A MESSAGE INCLUDING: YOUR NAME DATE OF BIRTH CALL BACK NUMBER REASON FOR CALL**this is important as we prioritize the call backs  YOU WILL RECEIVE A CALL BACK THE SAME DAY AS LONG AS YOU CALL BEFORE 4:00 PM

## 2023-05-17 NOTE — Progress Notes (Signed)
 PCP: Pcp, No HF Cardiology: Dr. Shirlee Latch  Chief complaint: CHF  48 y.o. with history of CKD stage 3, type 2 diabetes, HTN, paroxysmal atrial fibrillation, OHS/OSA, and chronic HF with mid range EF and prominent RV dysfunction returns for followup of CHF. Patient had LHC in 2022 with mild nonobstructive CAD.  He is on home oxygen 2L and has sleep apnea but does not yet have CPAP. Patient was admitted in 2/25 with volume overload and hypoxemic respiratory failure.  He was intubated and diuresed.   Weight is down 2 lbs.  He seems to be doing well overall. Waiting for sleep study to get CPAP.  Using home oxygen but did not bring it today.  He is short of breath with "long walks."  Chronic 2 pillow orthopnea.  No chest pain.  No lightheadedness. No palpitations.  He is in NSR today.   ECG (personally reviewed): NSR, PVCs, LAFB  Labs (3/25): K 3.9, creatinine 1.18  PMH: 1. CKD stage 3 2. Type 2 diabetes 3. HTN 4. Atrial fibrillation: Paroxysmal 5. OHS/OSA: Chronic hypoxemic respiratory failure on home oxygen. Trying to get CPAP.  6. HF with mid range EF: Echo (2/25): EF 45-50%, severe LVH, moderate RV enlargement with moderate RV dysfunction.  - LHC (5/22): Mild nonobstructive CAD.  - RHC (2/25): mean RA 8, PA 51/27, mean PCWP 19, CI 2.3 F/2.5 T.  PVR 2.4 WU, PAPi 3.  7. Hyperlipidemia 8. Polycythemia: Likely due to chronic hypoxemia  Family History  Problem Relation Age of Onset   Hypertension Father    Hypertension Brother    Pulmonary embolism Brother    Social History   Socioeconomic History   Marital status: Single    Spouse name: Not on file   Number of children: Not on file   Years of education: Not on file   Highest education level: Not on file  Occupational History   Not on file  Tobacco Use   Smoking status: Former    Current packs/day: 0.00    Average packs/day: 1 pack/day for 27.0 years (27.0 ttl pk-yrs)    Types: Cigarettes    Start date: 04/06/1993    Quit date:  04/06/2020    Years since quitting: 3.1   Smokeless tobacco: Never  Vaping Use   Vaping status: Never Used  Substance and Sexual Activity   Alcohol use: Not Currently   Drug use: Yes    Frequency: 7.0 times per week    Types: Marijuana    Comment: daily   Sexual activity: Yes    Partners: Female    Birth control/protection: None  Other Topics Concern   Not on file  Social History Narrative   Not on file   Social Drivers of Health   Financial Resource Strain: Low Risk  (04/09/2020)   Overall Financial Resource Strain (CARDIA)    Difficulty of Paying Living Expenses: Not very hard  Food Insecurity: Patient Unable To Answer (03/21/2023)   Hunger Vital Sign    Worried About Running Out of Food in the Last Year: Patient unable to answer    Ran Out of Food in the Last Year: Patient unable to answer  Transportation Needs: Patient Unable To Answer (03/21/2023)   PRAPARE - Transportation    Lack of Transportation (Medical): Patient unable to answer    Lack of Transportation (Non-Medical): Patient unable to answer  Physical Activity: Inactive (04/09/2020)   Exercise Vital Sign    Days of Exercise per Week: 0 days  Minutes of Exercise per Session: 0 min  Stress: Not on file  Social Connections: Not on file  Intimate Partner Violence: Patient Unable To Answer (03/21/2023)   Humiliation, Afraid, Rape, and Kick questionnaire    Fear of Current or Ex-Partner: Patient unable to answer    Emotionally Abused: Patient unable to answer    Physically Abused: Patient unable to answer    Sexually Abused: Patient unable to answer   ROS: All systems reviewed and negative except as per HPI.   Current Outpatient Medications  Medication Sig Dispense Refill   atorvastatin (LIPITOR) 40 MG tablet Take 2 tablets (80 mg total) by mouth at bedtime. 180 tablet 1   carvedilol (COREG) 6.25 MG tablet Take 1 tablet (6.25 mg total) by mouth 2 (two) times daily with a meal. 180 tablet 3   empagliflozin  (JARDIANCE) 10 MG TABS tablet Take 1 tablet (10 mg total) by mouth daily before breakfast. 30 tablet 3   OXYGEN Inhale 4 L into the lungs at bedtime.     rivaroxaban (XARELTO) 20 MG TABS tablet Take 1 tablet (20 mg total) by mouth daily with supper. 90 tablet 0   sacubitril-valsartan (ENTRESTO) 49-51 MG Take 1 tablet by mouth 2 (two) times daily. 60 tablet 6   spironolactone (ALDACTONE) 25 MG tablet Take 1 tablet (25 mg total) by mouth once daily. 90 tablet 2   torsemide (DEMADEX) 20 MG tablet Take 2 tablets (40 mg total) by mouth daily. 60 tablet 11   Semaglutide,0.25 or 0.5MG /DOS, 2 MG/3ML SOPN Inject 0.5 mg into the skin once a week. 3 mL 1   No current facility-administered medications for this visit.   BP (!) 118/101   Pulse 82   Wt 272 lb (123.4 kg)   SpO2 94%   BMI 43.90 kg/m  General: NAD Neck: No JVD, no thyromegaly or thyroid nodule.  Lungs: Clear to auscultation bilaterally with normal respiratory effort. CV: Nondisplaced PMI.  Heart regular S1/S2, no S3/S4, no murmur.  No peripheral edema.  No carotid bruit.  Normal pedal pulses.  Abdomen: Soft, nontender, no hepatosplenomegaly, no distention.  Skin: Intact without lesions or rashes.  Neurologic: Alert and oriented x 3.  Psych: Normal affect. Extremities: No clubbing or cyanosis.  HEENT: Normal.   Assessment/Plan:  1. Chronic HF with mid-range EF with prominent RV dysfunction: Significant RV dysfunction in the setting of OHS/OSA. Echo in 2/25 showed EF 45-50%, severe LVH, moderate RV enlargement and moderate RV dysfunction, PASP 44 mmHg. Cath in 5/22 showed mild nonobstructive coronary disease.  NYHA class II, he does not appear volume overloaded on exam.  - Continue home oxygen and will need to get on CPAP for appropriate treatment of OHS/OSA to improve RV function.  - Continue torsemide 40 mg daily.  BMET/BNP today.  - Continue spironolactone 25 mg daily.  - Continue Jardiance 10 mg daily.  - Continue Coreg 6.25 mg bid.   - Increase Entresto to 49/51 bid.  BMET 10 days. 2. 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   - Not candidate for pulmonary vasodilators.  3. OHS/OSA: Suspect this contributes to RV failure. He is on 2 L home oxygen.  - Needs sleep study to get CPAP again, pulmonary is scheduling this.  4. CKD stage 3: BMET today.  5. Polycythemia: Likely due to chronic hypoxemia.  6. Atrial fibrillation: Paroxysmal.  He is in NSR today.   -  Continue Xarelto.  7. Obesity: Continue semaglutide.   Followup 1 month with me.   I spent 31 minutes reviewing records, interviewing/examining patient, and managing orders.   Gregory Mckenzie 05/17/2023

## 2023-05-18 LAB — CBC
Hematocrit: 61.9 % — ABNORMAL HIGH (ref 37.5–51.0)
Hemoglobin: 19.6 g/dL — ABNORMAL HIGH (ref 13.0–17.7)
MCH: 27.8 pg (ref 26.6–33.0)
MCHC: 31.7 g/dL (ref 31.5–35.7)
MCV: 88 fL (ref 79–97)
Platelets: 168 10*3/uL (ref 150–450)
RBC: 7.05 x10E6/uL (ref 4.14–5.80)
RDW: 17.6 % — ABNORMAL HIGH (ref 11.6–15.4)
WBC: 4.5 10*3/uL (ref 3.4–10.8)

## 2023-05-18 LAB — BASIC METABOLIC PANEL WITH GFR
BUN/Creatinine Ratio: 13 (ref 9–20)
BUN: 17 mg/dL (ref 6–24)
CO2: 26 mmol/L (ref 20–29)
Calcium: 9.9 mg/dL (ref 8.7–10.2)
Chloride: 96 mmol/L (ref 96–106)
Creatinine, Ser: 1.34 mg/dL — ABNORMAL HIGH (ref 0.76–1.27)
Glucose: 68 mg/dL — ABNORMAL LOW (ref 70–99)
Potassium: 3.8 mmol/L (ref 3.5–5.2)
Sodium: 142 mmol/L (ref 134–144)
eGFR: 65 mL/min/{1.73_m2} (ref >59)

## 2023-05-18 LAB — LIPID PANEL
Chol/HDL Ratio: 3.7 ratio (ref 0.0–5.0)
Cholesterol, Total: 147 mg/dL (ref 100–199)
HDL: 40 mg/dL (ref >39)
LDL Chol Calc (NIH): 81 mg/dL (ref 0–99)
Triglycerides: 148 mg/dL (ref 0–149)
VLDL Cholesterol Cal: 26 mg/dL (ref 5–40)

## 2023-05-18 LAB — BRAIN NATRIURETIC PEPTIDE: BNP: 87 pg/mL (ref 0.0–100.0)

## 2023-05-19 ENCOUNTER — Telehealth: Payer: Self-pay

## 2023-05-19 NOTE — Telephone Encounter (Signed)
 Lvm to call back for results

## 2023-05-19 NOTE — Telephone Encounter (Signed)
-----   Message from Marca Ancona sent at 05/19/2023  8:49 AM EDT ----- Hemoglobin is very high.  Likely due to chronic hypoxemia but would refer to hematology for evaluation.

## 2023-05-21 ENCOUNTER — Encounter (HOSPITAL_BASED_OUTPATIENT_CLINIC_OR_DEPARTMENT_OTHER): Payer: Medicaid Other | Admitting: Pulmonary Disease

## 2023-05-22 ENCOUNTER — Telehealth: Payer: Self-pay | Admitting: Pulmonary Disease

## 2023-05-22 NOTE — Telephone Encounter (Signed)
 Copied from CRM [id number]. Topic: Appointments - Scheduling Inquiry for Clinic >> May 22, 2023 10:31 AM Gregory Mckenzie wrote: Reason for CRM: Patient is needing assistance rescheduling his sleep study - states he showed up & he was not in their system.   System states pt cancelled appt last night. Sent message to terri at the sleep center to provide me with more details. Will need Mckenzie new order to resch pt. Auth expires 4/20

## 2023-05-23 ENCOUNTER — Telehealth: Payer: Self-pay

## 2023-05-23 ENCOUNTER — Other Ambulatory Visit: Payer: Self-pay | Admitting: Pulmonary Disease

## 2023-05-23 DIAGNOSIS — G4733 Obstructive sleep apnea (adult) (pediatric): Secondary | ICD-10-CM

## 2023-05-23 DIAGNOSIS — I5042 Chronic combined systolic (congestive) and diastolic (congestive) heart failure: Secondary | ICD-10-CM

## 2023-05-23 DIAGNOSIS — I50810 Right heart failure, unspecified: Secondary | ICD-10-CM

## 2023-05-23 DIAGNOSIS — E662 Morbid (severe) obesity with alveolar hypoventilation: Secondary | ICD-10-CM

## 2023-05-23 NOTE — Telephone Encounter (Signed)
-----   Message from Marca Ancona sent at 05/19/2023  8:49 AM EDT ----- Hemoglobin is very high.  Likely due to chronic hypoxemia but would refer to hematology for evaluation.

## 2023-05-23 NOTE — Telephone Encounter (Signed)
Order placed for sleep study.

## 2023-05-23 NOTE — Telephone Encounter (Signed)
 Confirmed with sleep lab that the pt just cancelled his appt on 2/28 for 3/2   I will need to submit a new auth for this pt to resch

## 2023-05-24 MED FILL — Carvedilol Tab 6.25 MG: ORAL | 30 days supply | Qty: 60 | Fill #5 | Status: AC

## 2023-05-29 ENCOUNTER — Other Ambulatory Visit: Payer: Self-pay

## 2023-05-30 ENCOUNTER — Encounter: Payer: Self-pay | Admitting: Oncology

## 2023-05-30 ENCOUNTER — Inpatient Hospital Stay

## 2023-05-30 ENCOUNTER — Inpatient Hospital Stay: Attending: Oncology | Admitting: Oncology

## 2023-05-30 VITALS — BP 113/76 | HR 83 | Temp 96.6°F | Resp 18 | Wt 277.8 lb

## 2023-05-30 DIAGNOSIS — E1122 Type 2 diabetes mellitus with diabetic chronic kidney disease: Secondary | ICD-10-CM | POA: Insufficient documentation

## 2023-05-30 DIAGNOSIS — G4730 Sleep apnea, unspecified: Secondary | ICD-10-CM | POA: Insufficient documentation

## 2023-05-30 DIAGNOSIS — Z7984 Long term (current) use of oral hypoglycemic drugs: Secondary | ICD-10-CM | POA: Diagnosis not present

## 2023-05-30 DIAGNOSIS — I130 Hypertensive heart and chronic kidney disease with heart failure and stage 1 through stage 4 chronic kidney disease, or unspecified chronic kidney disease: Secondary | ICD-10-CM | POA: Diagnosis not present

## 2023-05-30 DIAGNOSIS — Z87891 Personal history of nicotine dependence: Secondary | ICD-10-CM | POA: Insufficient documentation

## 2023-05-30 DIAGNOSIS — N189 Chronic kidney disease, unspecified: Secondary | ICD-10-CM | POA: Insufficient documentation

## 2023-05-30 DIAGNOSIS — D751 Secondary polycythemia: Secondary | ICD-10-CM | POA: Diagnosis not present

## 2023-05-30 DIAGNOSIS — R748 Abnormal levels of other serum enzymes: Secondary | ICD-10-CM | POA: Diagnosis not present

## 2023-05-30 DIAGNOSIS — Z7901 Long term (current) use of anticoagulants: Secondary | ICD-10-CM | POA: Diagnosis not present

## 2023-05-30 LAB — CBC WITH DIFFERENTIAL/PLATELET
Abs Immature Granulocytes: 0.03 10*3/uL (ref 0.00–0.07)
Basophils Absolute: 0 10*3/uL (ref 0.0–0.1)
Basophils Relative: 0 %
Eosinophils Absolute: 0.1 10*3/uL (ref 0.0–0.5)
Eosinophils Relative: 3 %
HCT: 54.8 % — ABNORMAL HIGH (ref 39.0–52.0)
Hemoglobin: 17.6 g/dL — ABNORMAL HIGH (ref 13.0–17.0)
Immature Granulocytes: 1 %
Lymphocytes Relative: 23 %
Lymphs Abs: 1.1 10*3/uL (ref 0.7–4.0)
MCH: 28 pg (ref 26.0–34.0)
MCHC: 32.1 g/dL (ref 30.0–36.0)
MCV: 87.3 fL (ref 80.0–100.0)
Monocytes Absolute: 0.7 10*3/uL (ref 0.1–1.0)
Monocytes Relative: 15 %
Neutro Abs: 2.7 10*3/uL (ref 1.7–7.7)
Neutrophils Relative %: 58 %
Platelets: 180 10*3/uL (ref 150–400)
RBC: 6.28 MIL/uL — ABNORMAL HIGH (ref 4.22–5.81)
RDW: 18.4 % — ABNORMAL HIGH (ref 11.5–15.5)
WBC: 4.8 10*3/uL (ref 4.0–10.5)
nRBC: 0 % (ref 0.0–0.2)

## 2023-05-30 LAB — HEPATIC FUNCTION PANEL
ALT: 80 U/L — ABNORMAL HIGH (ref 0–44)
AST: 34 U/L (ref 15–41)
Albumin: 4 g/dL (ref 3.5–5.0)
Alkaline Phosphatase: 206 U/L — ABNORMAL HIGH (ref 38–126)
Bilirubin, Direct: 0.2 mg/dL (ref 0.0–0.2)
Indirect Bilirubin: 0.8 mg/dL (ref 0.3–0.9)
Total Bilirubin: 1 mg/dL (ref 0.0–1.2)
Total Protein: 7.4 g/dL (ref 6.5–8.1)

## 2023-05-30 NOTE — Assessment & Plan Note (Signed)
Check GGT 

## 2023-05-30 NOTE — Assessment & Plan Note (Signed)
 Recommend patient follow-up with pulmonology and get a sleep study for evaluation.

## 2023-05-30 NOTE — Assessment & Plan Note (Addendum)
 Chronic erythrocytosis. Differential includes primary myeloproliferative disorder or secondary causes like untreated sleep apnea and hypoxia etc. Check CBC, BCR-ABL 1 FISH, carbo monoxide level, erythropoietin, JAK2 V617F mutation with reflex to other mutations. Today his hematocrit is less than 55.  I will hold off emergent phlebotomy

## 2023-05-30 NOTE — Progress Notes (Signed)
 Hematology/Oncology Consult note [phone] [phone]        REFERRING PROVIDER: Darlis Eisenmenger, MD   CHIEF COMPLAINTS/REASON FOR VISIT:  Evaluation of elevated hemoglobin.   ASSESSMENT & PLAN:   Erythrocytosis Chronic erythrocytosis. Differential includes primary myeloproliferative disorder or secondary causes like untreated sleep apnea and hypoxia etc. Check CBC, BCR-ABL 1 FISH, carbo monoxide level, erythropoietin, JAK2 V617F mutation with reflex to other mutations. Today his hematocrit is less than 55.  I will hold off emergent phlebotomy    Sleep apnea Recommend patient follow-up with pulmonology and get a sleep study for evaluation.  Elevated alkaline phosphatase level Check GGT  Encourage patient to establish care with primary care provider.  A list of local primary care providers offices was provided to patient.  Orders Placed This Encounter  Procedures   CBC with Differential/Platelet    Standing Status:   Future    Number of Occurrences:   1    Expected Date:   05/30/2023    Expiration Date:   05/29/2024   BCR-ABL1 FISH    Standing Status:   Future    Number of Occurrences:   1    Expected Date:   05/30/2023    Expiration Date:   05/29/2024   Carbon monoxide, blood (performed at ref lab)    Standing Status:   Future    Number of Occurrences:   1    Expected Date:   05/30/2023    Expiration Date:   05/29/2024   Erythropoietin    Standing Status:   Future    Number of Occurrences:   1    Expected Date:   05/30/2023    Expiration Date:   05/29/2024   JAK2 V617F rfx CALR/MPL/E12-15    Standing Status:   Future    Number of Occurrences:   1    Expected Date:   05/30/2023    Expiration Date:   05/29/2024   Hepatic function panel    Standing Status:   Future    Number of Occurrences:   1    Expected Date:   05/30/2023    Expiration Date:   05/29/2024   Follow-up in 2 to 3 weeks to discuss lab results All questions were answered. The  patient knows to call the clinic with any problems, questions or concerns.  Gregory Forbes, MD, PhD [facility] Hematology Oncology 05/30/2023   HISTORY OF PRESENTING ILLNESS:   Gregory Mckenzie is a  48 y.o.  male with PMH listed below was seen in consultation at the request of  Darlis Eisenmenger, MD  for evaluation of elevated hemoglobin  He has consistently elevated hemoglobin and hematocrit levels, with the most recent values on May 17, 2023, showing a hematocrit of 61 and a hemoglobin level of 19.6.  He does not have a primary care doctor and is attempting to establish care with one.  He has a history of congestive heart failure, diagnosed in 2022, which led him to quit smoking. He is managing his heart condition with medication and is learning to control his fluid intake.   He also has a history of sleep apnea but has not yet been scheduled for a sleep study.  Patient was seen by pulmonology.  He is waiting for a call back to arrange the study. He uses oxygen at night since 2022 but has not been using a CPAP machine.  Patient denies any testosterone use.    MEDICAL HISTORY:  Past Medical History:  Diagnosis Date   Atrial flutter (HCC)    a. CHA2DS2VASc = 4-->xarelto.   CAD (coronary artery disease)    a. 04/2020 Cath: LM nl, LAD 30ost/p, LCX nl, RCA 30p/m.   Chronic combined systolic and diastolic CHF (congestive heart failure) (HCC)    a. 03/2020 Echo: EF 40-45%; b. 05/2021 Echo: EF 40-45%; c. 10/2022 Echo: EF 55-60%, no rwma, sev LVH, RVSP 23.1 mmHg.   Diabetes mellitus without complication (HCC)    Hyperlipidemia    Hypertension    NICM (nonischemic cardiomyopathy) (HCC)    Nonadherence to medication    Obesity    Sleep apnea, obstructive     SURGICAL HISTORY: Past Surgical History:  Procedure Laterality Date   RIGHT HEART CATH N/A 03/31/2023   Procedure: RIGHT HEART CATH;  Surgeon: Mardell Shade, MD;  Location: [facility] INVASIVE CV LAB;  Service: Cardiovascular;   Laterality: N/A;  Heart Failure with mid-range ejection fraction   RIGHT/LEFT HEART CATH AND CORONARY ANGIOGRAPHY N/A 05/08/2020   Procedure: RIGHT/LEFT HEART CATH AND CORONARY ANGIOGRAPHY;  Surgeon: Darlis Eisenmenger, MD;  Location: [facility] INVASIVE CV LAB;  Service: Cardiovascular;  Laterality: N/A;    SOCIAL HISTORY: Social History   Socioeconomic History   Marital status: Single    Spouse name: Not on file   Number of children: Not on file   Years of education: Not on file   Highest education level: Not on file  Occupational History   Not on file  Tobacco Use   Smoking status: Former    Current packs/day: 0.00    Average packs/day: 1 pack/day for 27.0 years (27.0 ttl pk-yrs)    Types: Cigarettes    Start date: 04/06/1993    Quit date: 04/06/2020    Years since quitting: 3.1   Smokeless tobacco: Never  Vaping Use   Vaping status: Never Used  Substance and Sexual Activity   Alcohol use: Not Currently   Drug use: Not Currently    Frequency: 7.0 times per week    Types: Marijuana    Comment: daily   Sexual activity: Yes    Partners: Female    Birth control/protection: None  Other Topics Concern   Not on file  Social History Narrative   Not on file   Social Drivers of Health   Financial Resource Strain: Low Risk  (04/09/2020)   Overall Financial Resource Strain (CARDIA)    Difficulty of Paying Living Expenses: Not very hard  Food Insecurity: No Food Insecurity (05/30/2023)   Hunger Vital Sign    Worried About Running Out of Food in the Last Year: Never true    Ran Out of Food in the Last Year: Never true  Transportation Needs: No Transportation Needs (05/30/2023)   PRAPARE - Administrator, Civil Service (Medical): No    Lack of Transportation (Non-Medical): No  Physical Activity: Inactive (04/09/2020)   Exercise Vital Sign    Days of Exercise per Week: 0 days    Minutes of Exercise per Session: 0 min  Stress: Not on file  Social Connections: Not on file   Intimate Partner Violence: Not At Risk (05/30/2023)   Humiliation, Afraid, Rape, and Kick questionnaire    Fear of Current or Ex-Partner: No    Emotionally Abused: No    Physically Abused: No    Sexually Abused: No    FAMILY HISTORY: Family History  Problem Relation Age of Onset   Hypertension Father    Hypertension Brother    Pulmonary  embolism Brother    Pancreatic cancer Paternal Uncle     ALLERGIES:  has no known allergies.  MEDICATIONS:  Current Outpatient Medications  Medication Sig Dispense Refill   atorvastatin (LIPITOR) 40 MG tablet Take 2 tablets (80 mg total) by mouth at bedtime. 180 tablet 1   carvedilol (COREG) 6.25 MG tablet Take 1 tablet (6.25 mg total) by mouth 2 (two) times daily with a meal. 180 tablet 3   empagliflozin (JARDIANCE) 10 MG TABS tablet Take 1 tablet (10 mg total) by mouth daily before breakfast. 30 tablet 3   OXYGEN Inhale 4 L into the lungs at bedtime.     rivaroxaban (XARELTO) 20 MG TABS tablet Take 1 tablet (20 mg total) by mouth daily with supper. 90 tablet 0   sacubitril-valsartan (ENTRESTO) 49-51 MG Take 1 tablet by mouth 2 (two) times daily. 60 tablet 6   Semaglutide,0.25 or 0.5MG /DOS, 2 MG/3ML SOPN Inject 0.5 mg into the skin once a week. 3 mL 1   spironolactone (ALDACTONE) 25 MG tablet Take 1 tablet (25 mg total) by mouth once daily. 90 tablet 2   torsemide (DEMADEX) 20 MG tablet Take 2 tablets (40 mg total) by mouth daily. 60 tablet 11   No current facility-administered medications for this visit.    Review of Systems  Constitutional:  Positive for fatigue. Negative for appetite change, chills, fever and unexpected weight change.  HENT:   Negative for hearing loss and voice change.   Eyes:  Negative for eye problems and icterus.  Respiratory:  Negative for chest tightness, cough and shortness of breath.   Cardiovascular:  Negative for chest pain and leg swelling.  Gastrointestinal:  Negative for abdominal distention and abdominal  pain.  Endocrine: Negative for hot flashes.  Genitourinary:  Negative for difficulty urinating, dysuria and frequency.   Musculoskeletal:  Negative for arthralgias.  Skin:  Negative for itching and rash.  Neurological:  Negative for light-headedness and numbness.  Hematological:  Negative for adenopathy. Does not bruise/bleed easily.  Psychiatric/Behavioral:  Negative for confusion.    PHYSICAL EXAMINATION:  Vitals:   05/30/23 1122  BP: 113/76  Pulse: 83  Resp: 18  Temp: (!) 96.6 F (35.9 C)   Filed Weights   05/30/23 1122  Weight: 277 lb 12.8 oz (126 kg)    Physical Exam Constitutional:      General: He is not in acute distress.    Appearance: He is obese.  HENT:     Head: Normocephalic and atraumatic.  Eyes:     General: No scleral icterus. Cardiovascular:     Rate and Rhythm: Normal rate and regular rhythm.     Heart sounds: Normal heart sounds.  Pulmonary:     Effort: Pulmonary effort is normal. No respiratory distress.     Breath sounds: Normal breath sounds. No wheezing.  Abdominal:     General: There is no distension.     Palpations: Abdomen is soft.  Musculoskeletal:        General: No deformity. Normal range of motion.     Cervical back: Normal range of motion and neck supple.  Skin:    General: Skin is warm and dry.     Findings: No erythema or rash.  Neurological:     Mental Status: He is alert and oriented to person, place, and time. Mental status is at baseline.  Psychiatric:        Mood and Affect: Mood normal.     LABORATORY DATA:  I have reviewed  the data as listed    Latest Ref Rng & Units 05/30/2023   11:47 AM 05/17/2023   11:51 AM 03/31/2023    1:29 PM  CBC  WBC 4.0 - 10.5 K/uL 4.8  4.5    Hemoglobin 13.0 - 17.0 g/dL 38.7  56.4  33.2   Hematocrit 39.0 - 52.0 % 54.8  61.9  64.0   Platelets 150 - 400 K/uL 180  168        Latest Ref Rng & Units 05/30/2023   11:47 AM 05/17/2023   11:51 AM 04/19/2023    2:00 PM  CMP  Glucose 70 - 99 mg/dL   68  98   BUN 6 - 24 mg/dL  17  25   Creatinine 9.51 - 1.27 mg/dL  8.84  1.66   Sodium 063 - 144 mmol/L  142  138   Potassium 3.5 - 5.2 mmol/L  3.8  3.9   Chloride 96 - 106 mmol/L  96  99   CO2 20 - 29 mmol/L  26  28   Calcium 8.7 - 10.2 mg/dL  9.9  9.6   Total Protein 6.5 - 8.1 g/dL 7.4     Total Bilirubin 0.0 - 1.2 mg/dL 1.0     Alkaline Phos 38 - 126 U/L 206     AST 15 - 41 U/L 34     ALT 0 - 44 U/L 80         RADIOGRAPHIC STUDIES: I have personally reviewed the radiological images as listed and agreed with the findings in the report. CARDIAC CATHETERIZATION Result Date: 03/31/2023 Findings: RA = 8 RV = 52/16 PA = 51/27 (36) PCW = 19 Fick cardiac output/index = 5.9/2.3 Thermo CO/CI = 6.2/2.5 PVR = 3,4 WU Ao sat = 90% PA sat = 69%, 71% PAPi = 3.0 ABG on 4L O2 7.26/71/71/90% Assessment: 1. 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 Mildly increased left-sided filling pressures 3, Normal cardiac output Plan/Discussion: Volume status about as good as we are going to get it. Continue torsemide 40 daily. Can increase as needed. No role for selective pulmonary artery vasodilators at this time. Consider Pulmonary input to help with BiPAP at time of d/c. Arvilla Meres, MD 1:54 PM  DG Chest Port 1 View Result Date: 03/29/2023 CLINICAL DATA:  10026 Shortness of breath 10026 EXAM: PORTABLE CHEST 1 VIEW COMPARISON:  03/27/2023 FINDINGS: Endotracheal and enteric tubes have been removed. Right PICC line also removed. Stable cardiomegaly. Pulmonary vascular congestion. No focal airspace consolidation, pleural effusion, or pneumothorax. IMPRESSION: Cardiomegaly with pulmonary vascular congestion. Electronically Signed   By: Duanne Guess D.O.   On: 03/29/2023 12:08   DG Chest Port 1 View Result Date: 03/27/2023 CLINICAL DATA:  Shortness of breath EXAM: PORTABLE CHEST 1 VIEW COMPARISON:  03/22/2023 FINDINGS: Cardiac shadow is stable. Endotracheal tube is noted just above the carina. This should be  withdrawn 2-3 cm. Right PICC and gastric catheter are seen. The tip of the gastric catheter is not well appreciated on this exam. Lungs are hypoinflated with mild vascular congestion. No sizable effusion is noted. IMPRESSION: Endotracheal tube at the level of the carina. This should be withdrawn 2-3 cm. These results will be called to the ordering clinician or representative by the Radiologist Assistant, and communication documented in the PACS or [employer]. Electronically Signed   By: Alcide Clever M.D.   On: 03/27/2023 10:55   DG Abd 1 View Result Date: 03/22/2023 CLINICAL DATA:  48 year old male enteric tube  placement. EXAM: ABDOMEN - 1 VIEW COMPARISON:  03/21/2023 and earlier. FINDINGS: Portable AP semi upright view at 0859 hours. Enteric tube placed into the stomach and terminates across midline to the right at the level of the gastric antrum. Compared to portable chest x-ray 0308 hours today there is bilateral dense lung base opacity. Nonobstructed visible bowel gas pattern. IMPRESSION: 1. Satisfactory enteric tube placement into the distal stomach. 2. Confluent bilateral lung base opacity. Electronically Signed   By: Odessa Fleming M.D.   On: 03/22/2023 10:05   DG Chest Port 1 View Result Date: 03/22/2023 CLINICAL DATA:  Acute respiratory failure with hypoxia. EXAM: PORTABLE CHEST 1 VIEW COMPARISON:  03/21/2023 FINDINGS: Infected tube tip is 5.2 cm above the base of the carina. Low lung volumes with vascular congestion. Bibasilar collapse/consolidation is similar to prior. The cardio pericardial silhouette is enlarged. Telemetry leads overlie the chest. IMPRESSION: Low lung volumes with vascular congestion and bibasilar collapse/consolidation. Electronically Signed   By: Kennith Center M.D.   On: 03/22/2023 07:33   ECHOCARDIOGRAM COMPLETE Result Date: 03/21/2023    ECHOCARDIOGRAM REPORT   Patient Name:   SHILOH SWOPES Date of Exam: 03/21/2023 Medical Rec #:  [id number]            Height:       66.0 in  Accession #:    [id number]           Weight:       343.0 lb Date of Birth:  1975-12-08            BSA:          2.515 m Patient Age:    47 years             BP:           113/70 mmHg Patient Gender: M                    HR:           66 bpm. Exam Location:  [facility] Procedure: 2D Echo, Cardiac Doppler, Color Doppler and Intracardiac            Opacification Agent Indications:     CHF  History:         Patient has prior history of Echocardiogram examinations, most                  recent 10/20/2022. CHF and Cardiomyopathy, Acute MI,                  Arrythmias:Atrial Flutter, Signs/Symptoms:Edema and Dyspnea;                  Risk Factors:Hypertension and Diabetes. CKD.  Sonographer:     Mikki Harbor Referring Phys:  [id number] ELIZABETH ACHIENG OUMA Diagnosing Phys: Alwyn Pea MD  Sonographer Comments: Technically difficult study due to poor echo windows, no subcostal window, patient is obese and echo performed with patient supine and on artificial respirator. IMPRESSIONS  1. Left ventricular ejection fraction, by estimation, is 45 to 50%. The left ventricle has mildly decreased function. The left ventricle demonstrates global hypokinesis. There is severe concentric left ventricular hypertrophy. Left ventricular diastolic  parameters are consistent with Grade I diastolic dysfunction (impaired relaxation).  2. Right ventricular systolic function is moderately reduced. The right ventricular size is moderately enlarged. Mildly increased right ventricular wall thickness. There is mildly elevated pulmonary artery systolic pressure.  3. Left atrial size was mildly dilated.  4.  Right atrial size was mildly dilated.  5. The mitral valve is normal in structure. No evidence of mitral valve regurgitation.  6. The aortic valve is normal in structure. Aortic valve regurgitation is not visualized. FINDINGS  Left Ventricle: Left ventricular ejection fraction, by estimation, is 45 to 50%. The left ventricle has mildly decreased  function. The left ventricle demonstrates global hypokinesis. Definity contrast agent was given IV to delineate the left ventricular  endocardial borders. The left ventricular internal cavity size was normal in size. There is severe concentric left ventricular hypertrophy. Left ventricular diastolic parameters are consistent with Grade I diastolic dysfunction (impaired relaxation). Right Ventricle: The right ventricular size is moderately enlarged. Mildly increased right ventricular wall thickness. Right ventricular systolic function is moderately reduced. There is mildly elevated pulmonary artery systolic pressure. The tricuspid regurgitant velocity is 2.67 m/s, and with an assumed right atrial pressure of 15 mmHg, the estimated right ventricular systolic pressure is 43.5 mmHg. Left Atrium: Left atrial size was mildly dilated. Right Atrium: Right atrial size was mildly dilated. Pericardium: There is no evidence of pericardial effusion. Mitral Valve: The mitral valve is normal in structure. No evidence of mitral valve regurgitation. MV peak gradient, 1.8 mmHg. The mean mitral valve gradient is 1.0 mmHg. Tricuspid Valve: The tricuspid valve is normal in structure. Tricuspid valve regurgitation is trivial. Aortic Valve: The aortic valve is normal in structure. Aortic valve regurgitation is not visualized. Aortic valve mean gradient measures 3.0 mmHg. Aortic valve peak gradient measures 6.8 mmHg. Aortic valve area, by VTI measures 2.82 cm. Pulmonic Valve: The pulmonic valve was grossly normal. Pulmonic valve regurgitation is not visualized. Aorta: The ascending aorta was not well visualized. IAS/Shunts: No atrial level shunt detected by color flow Doppler.  LEFT VENTRICLE PLAX 2D LVIDd:         4.40 cm LVIDs:         3.30 cm LV PW:         1.70 cm LV IVS:        2.00 cm LVOT diam:     2.10 cm LV SV:         65 LV SV Index:   26 LVOT Area:     3.46 cm  RIGHT VENTRICLE RV Basal diam:  5.60 cm RV Mid diam:    4.10 cm RV  S prime:     13.20 cm/s TAPSE (M-mode): 2.3 cm LEFT ATRIUM         Index       RIGHT ATRIUM           Index LA diam:    4.80 cm 1.91 cm/m  RA Area:     31.50 cm                                 RA Volume:   131.00 ml 52.08 ml/m  AORTIC VALVE                    PULMONIC VALVE AV Area (Vmax):    3.14 cm     PV Vmax:       0.98 m/s AV Area (Vmean):   2.94 cm     PV Peak grad:  3.8 mmHg AV Area (VTI):     2.82 cm AV Vmax:           130.00 cm/s AV Vmean:          81.000 cm/s  AV VTI:            0.231 m AV Peak Grad:      6.8 mmHg AV Mean Grad:      3.0 mmHg LVOT Vmax:         118.00 cm/s LVOT Vmean:        68.800 cm/s LVOT VTI:          0.188 m LVOT/AV VTI ratio: 0.81  AORTA Ao Root diam: 3.40 cm MITRAL VALVE               TRICUSPID VALVE MV Area (PHT): 2.53 cm    TR Peak grad:   28.5 mmHg MV Area VTI:   2.79 cm    TR Vmax:        267.00 cm/s MV Peak grad:  1.8 mmHg MV Mean grad:  1.0 mmHg    SHUNTS MV Vmax:       0.67 m/s    Systemic VTI:  0.19 m MV Vmean:      41.7 cm/s   Systemic Diam: 2.10 cm MV Decel Time: 300 msec MV E velocity: 56.60 cm/s MV A velocity: 57.80 cm/s MV E/A ratio:  0.98 Dwayne D Callwood MD Electronically signed by Alwyn Pea MD Signature Date/Time: 03/21/2023/4:48:17 PM    Final    DG Chest Port 1 View Addendum Date: 03/21/2023 ADDENDUM REPORT: 03/21/2023 11:59 ADDENDUM: Study discussed by telephone with Dr. Aundria Rud on 03/21/2023 at 1156 hours. Electronically Signed   By: Odessa Fleming M.D.   On: 03/21/2023 11:59   Result Date: 03/21/2023 CLINICAL DATA:  48 year old male with respiratory failure. Intubated. Enteric tube placement. EXAM: PORTABLE CHEST 1 VIEW COMPARISON:  Portable abdomen today. Portable chest 0039 hours today. FINDINGS: Portable AP upright view at 1048 hours. The enteric feeding tube loops at the level of the hypopharynx and does not continue distally. Stable endotracheal tube, tip in good position between the level the clavicles and carina. Stable enlarged cardiac silhouette  and bibasilar pulmonary hypo ventilation. No superimposed pneumothorax or pulmonary edema. Paucity of bowel gas. Stable visualized osseous structures. IMPRESSION: 1. Enteric feeding tube loops at the hypopharynx and does not continue distally. This should be removed and re-attempted. 2. Satisfactory ET tube. Stable bibasilar hypo ventilation, evidence of cardiomegaly. Electronically Signed: By: Odessa Fleming M.D. On: 03/21/2023 11:46   DG Abd 1 View Result Date: 03/21/2023 CLINICAL DATA:  48 year old male NG tube. EXAM: ABDOMEN - 1 VIEW COMPARISON:  05/21/2021. FINDINGS: 2 AP supine views at 1044 hours with no enteric tube identified in the lower chest or visible abdomen. Paucity of bowel gas. EKG leads and wires. IMPRESSION: No enteric tube visible at the diaphragm or in the upper abdomen. See portable chest x-ray 1048 hours reported separately. Electronically Signed   By: Odessa Fleming M.D.   On: 03/21/2023 11:44   [place] EKG SITE RITE Result Date: 03/21/2023 If Site Rite image not attached, placement could not be confirmed due to current cardiac rhythm.  DG Chest Portable 1 View Result Date: 03/21/2023 CLINICAL DATA:  Intubation EXAM: PORTABLE CHEST 1 VIEW COMPARISON:  03/20/2023 FINDINGS: Endotracheal tube tip is just below the level of the clavicular heads. Unchanged cardiomegaly and pulmonary vascular congestion. IMPRESSION: Endotracheal tube tip just below the level of the clavicular heads. Electronically Signed   By: Deatra Robinson M.D.   On: 03/21/2023 01:22   DG Chest 2 View Result Date: 03/20/2023 CLINICAL DATA:  Low O2 sats, weight gain. EXAM: CHEST - 2  VIEW COMPARISON:  05/25/2021 FINDINGS: Cardiomegaly, vascular congestion. No overt edema, confluent opacities or effusions. No acute bony abnormality. IMPRESSION: Cardiomegaly, vascular congestion. Electronically Signed   By: Janeece Mechanic M.D.   On: 03/20/2023 11:21

## 2023-05-31 LAB — ERYTHROPOIETIN: Erythropoietin: 32.5 m[IU]/mL — ABNORMAL HIGH (ref 2.6–18.5)

## 2023-05-31 LAB — CARBON MONOXIDE, BLOOD (PERFORMED AT REF LAB): Carbon Monoxide, Blood: 2.8 % (ref 0.0–3.6)

## 2023-06-01 ENCOUNTER — Encounter (HOSPITAL_BASED_OUTPATIENT_CLINIC_OR_DEPARTMENT_OTHER): Admitting: Pulmonary Disease

## 2023-06-02 NOTE — Telephone Encounter (Signed)
 I got pts auth extended to 6/30 and rescheduled appt for 5/8   Will call pt

## 2023-06-03 LAB — BCR-ABL1 FISH
Cells Analyzed: 200
Cells Counted: 200

## 2023-06-07 LAB — JAK2 V617F RFX CALR/MPL/E12-15

## 2023-06-07 LAB — CALR +MPL + E12-E15  (REFLEX)

## 2023-06-13 ENCOUNTER — Telehealth: Payer: Self-pay | Admitting: Cardiology

## 2023-06-13 NOTE — Telephone Encounter (Signed)
 Called to confirm/remind patient of [facility] appointment [facility] on [facility]/14[facility].   Appointment:   [x] Confirmed  [] Left mess   [] No answer/No voice mail  [] VM Full/unable to leave message  [] Phone not in service  Patient reminded to bring all medications and/or complete list.  Confirmed patient has transportation. Gave directions, instructed to utilize valet parking.

## 2023-06-14 ENCOUNTER — Ambulatory Visit: Attending: Cardiology | Admitting: Cardiology

## 2023-06-14 ENCOUNTER — Inpatient Hospital Stay (HOSPITAL_BASED_OUTPATIENT_CLINIC_OR_DEPARTMENT_OTHER): Admitting: Oncology

## 2023-06-14 ENCOUNTER — Encounter: Payer: Self-pay | Admitting: Oncology

## 2023-06-14 VITALS — BP 98/72 | HR 86 | Wt 278.0 lb

## 2023-06-14 VITALS — BP 102/74 | HR 86 | Temp 97.5°F | Resp 18 | Wt 276.4 lb

## 2023-06-14 DIAGNOSIS — I5022 Chronic systolic (congestive) heart failure: Secondary | ICD-10-CM | POA: Diagnosis not present

## 2023-06-14 DIAGNOSIS — D751 Secondary polycythemia: Secondary | ICD-10-CM | POA: Diagnosis not present

## 2023-06-14 DIAGNOSIS — I2721 Secondary pulmonary arterial hypertension: Secondary | ICD-10-CM | POA: Diagnosis not present

## 2023-06-14 DIAGNOSIS — G4730 Sleep apnea, unspecified: Secondary | ICD-10-CM | POA: Diagnosis not present

## 2023-06-14 DIAGNOSIS — I2510 Atherosclerotic heart disease of native coronary artery without angina pectoris: Secondary | ICD-10-CM | POA: Diagnosis not present

## 2023-06-14 DIAGNOSIS — N183 Chronic kidney disease, stage 3 unspecified: Secondary | ICD-10-CM | POA: Diagnosis not present

## 2023-06-14 DIAGNOSIS — Z9981 Dependence on supplemental oxygen: Secondary | ICD-10-CM | POA: Diagnosis not present

## 2023-06-14 DIAGNOSIS — Z87891 Personal history of nicotine dependence: Secondary | ICD-10-CM | POA: Insufficient documentation

## 2023-06-14 DIAGNOSIS — Z122 Encounter for screening for malignant neoplasm of respiratory organs: Secondary | ICD-10-CM | POA: Diagnosis not present

## 2023-06-14 DIAGNOSIS — Z7901 Long term (current) use of anticoagulants: Secondary | ICD-10-CM | POA: Insufficient documentation

## 2023-06-14 DIAGNOSIS — I130 Hypertensive heart and chronic kidney disease with heart failure and stage 1 through stage 4 chronic kidney disease, or unspecified chronic kidney disease: Secondary | ICD-10-CM | POA: Diagnosis not present

## 2023-06-14 DIAGNOSIS — E1122 Type 2 diabetes mellitus with diabetic chronic kidney disease: Secondary | ICD-10-CM | POA: Insufficient documentation

## 2023-06-14 DIAGNOSIS — R0902 Hypoxemia: Secondary | ICD-10-CM | POA: Insufficient documentation

## 2023-06-14 DIAGNOSIS — Z7984 Long term (current) use of oral hypoglycemic drugs: Secondary | ICD-10-CM | POA: Insufficient documentation

## 2023-06-14 DIAGNOSIS — I480 Paroxysmal atrial fibrillation: Secondary | ICD-10-CM | POA: Insufficient documentation

## 2023-06-14 DIAGNOSIS — I5042 Chronic combined systolic (congestive) and diastolic (congestive) heart failure: Secondary | ICD-10-CM

## 2023-06-14 DIAGNOSIS — Z7985 Long-term (current) use of injectable non-insulin antidiabetic drugs: Secondary | ICD-10-CM | POA: Diagnosis not present

## 2023-06-14 DIAGNOSIS — R911 Solitary pulmonary nodule: Secondary | ICD-10-CM | POA: Insufficient documentation

## 2023-06-14 DIAGNOSIS — G4733 Obstructive sleep apnea (adult) (pediatric): Secondary | ICD-10-CM | POA: Insufficient documentation

## 2023-06-14 DIAGNOSIS — R7989 Other specified abnormal findings of blood chemistry: Secondary | ICD-10-CM | POA: Diagnosis not present

## 2023-06-14 DIAGNOSIS — Z79899 Other long term (current) drug therapy: Secondary | ICD-10-CM | POA: Insufficient documentation

## 2023-06-14 NOTE — Assessment & Plan Note (Signed)
 Recommend start CT chest without contrast-lung cancer screening once he turns 48.

## 2023-06-14 NOTE — Progress Notes (Signed)
 Hematology/Oncology Consult note [phone] [phone]        REFERRING PROVIDER: Darlis Eisenmenger, MD   CHIEF COMPLAINTS/REASON FOR VISIT:  Secondary erythrocytosis   ASSESSMENT & PLAN:   Erythrocytosis Chronic erythrocytosis. Differential includes primary myeloproliferative disorder or secondary causes like untreated sleep apnea and hypoxia etc. 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  He is going to have sleep study done. hematocrit is less than 55.  I will hold off emergent phlebotomy    Sleep apnea Recommend patient follow-up with pulmonology and get a sleep study for evaluation.  Former smoker Recommend start CT chest without contrast-lung cancer screening once he turns 48.  Abnormal LFTs Possibly due to fatty liver disease.  Check right upper quadrant ultrasound.  Check hepatitis panel at the next visit.  Encourage patient to establish care with primary care provider.  A list of local primary care providers offices was provided to patient.  Orders Placed This Encounter  Procedures   US  Abdomen Limited RUQ (LIVER/GB)    Standing Status:   Future    Expected Date:   06/21/2023    Expiration Date:   06/13/2024    Reason for Exam (SYMPTOM  OR DIAGNOSIS REQUIRED):   abnormal LFT    Preferred imaging location?:   [facility]   CT Chest Wo Contrast    Standing Status:   Future    Expected Date:   08/29/2023    Expiration Date:   06/13/2024    Preferred imaging location?:   [facility]   CBC with Differential (Cancer Center Only)    Standing Status:   Future    Expected Date:   09/13/2023    Expiration Date:   06/13/2024   Hepatitis panel, acute    Standing Status:   Future    Expected Date:   09/13/2023    Expiration  Date:   06/13/2024   Follow-up in 3 months.   All questions were answered. The patient knows to call the clinic with any problems, questions or concerns.  [name], MD, PhD [facility] Hematology Oncology 06/14/2023   HISTORY OF PRESENTING ILLNESS:   Gregory Mckenzie is a  48 y.o.  male with PMH listed below was seen in consultation at the request of  Darlis Eisenmenger, MD  for evaluation of elevated hemoglobin  He has consistently elevated hemoglobin and hematocrit levels, with the most recent values on May 17, 2023, showing a hematocrit of 61 and a hemoglobin level of 19.6.  He does not have a primary care doctor and is attempting to establish care with one.  He has a history of congestive heart failure, diagnosed in 2022, which led him to quit smoking. He is managing his heart condition with medication and is learning to control his fluid intake.   He also has a history of sleep apnea but has not yet been scheduled for a sleep study.  Patient was seen by pulmonology.  He is waiting for a call back to arrange the study. He uses oxygen at night since 2022 but has not been using a CPAP machine.  Patient denies any testosterone use.  INTERVAL HISTORY Gregory Mckenzie is a 48 y.o. male who has above history reviewed by me today presents for follow up visit for  Erythrocytosis.  Patient presents to discuss lab  results. Is a former smoker, quit in 2011.  27-year pack smoking history.  MEDICAL HISTORY:  Past Medical History:  Diagnosis Date   Atrial flutter (HCC)    a. CHA2DS2VASc = 4-->xarelto .   CAD (coronary artery disease)    a. 04/2020 Cath: LM nl, LAD 30ost/p, LCX nl, RCA 30p/m.   Chronic combined systolic and diastolic CHF (congestive heart failure) (HCC)    a. 03/2020 Echo: EF 40-45%; b. 05/2021 Echo: EF 40-45%; c. 10/2022 Echo: EF 55-60%, no rwma, sev LVH, RVSP 23.1 mmHg.   Diabetes mellitus without complication (HCC)    Hyperlipidemia    Hypertension    NICM  (nonischemic cardiomyopathy) (HCC)    Nonadherence to medication    Obesity    Sleep apnea, obstructive     SURGICAL HISTORY: Past Surgical History:  Procedure Laterality Date   RIGHT HEART CATH N/A 03/31/2023   Procedure: RIGHT HEART CATH;  Surgeon: Mardell Shade, MD;  Location: [facility] INVASIVE CV LAB;  Service: Cardiovascular;  Laterality: N/A;  Heart Failure with mid-range ejection fraction   RIGHT/LEFT HEART CATH AND CORONARY ANGIOGRAPHY N/A 05/08/2020   Procedure: RIGHT/LEFT HEART CATH AND CORONARY ANGIOGRAPHY;  Surgeon: Darlis Eisenmenger, MD;  Location: [facility] INVASIVE CV LAB;  Service: Cardiovascular;  Laterality: N/A;    SOCIAL HISTORY: Social History   Socioeconomic History   Marital status: Single    Spouse name: Not on file   Number of children: Not on file   Years of education: Not on file   Highest education level: Not on file  Occupational History   Not on file  Tobacco Use   Smoking status: Former    Current packs/day: 0.00    Average packs/day: 1 pack/day for 26.9 years (26.9 ttl pk-yrs)    Types: Cigarettes    Start date: 04/06/1993    Quit date: 2022    Years since quitting: 3.3   Smokeless tobacco: Never  Vaping Use   Vaping status: Never Used  Substance and Sexual Activity   Alcohol  use: Not Currently   Drug use: Not Currently    Frequency: 7.0 times per week    Types: Marijuana    Comment: daily   Sexual activity: Yes    Partners: Female    Birth control/protection: None  Other Topics Concern   Not on file  Social History Narrative   Not on file   Social Drivers of Health   Financial Resource Strain: Low Risk  (04/09/2020)   Overall Financial Resource Strain (CARDIA)    Difficulty of Paying Living Expenses: Not very hard  Food Insecurity: No Food Insecurity (05/30/2023)   Hunger Vital Sign    Worried About Running Out of Food in the Last Year: Never true    Ran Out of Food in the Last Year: Never true  Transportation Needs: No Transportation  Needs (05/30/2023)   PRAPARE - Administrator, Civil Service (Medical): No    Lack of Transportation (Non-Medical): No  Physical Activity: Inactive (04/09/2020)   Exercise Vital Sign    Days of Exercise per Week: 0 days    Minutes of Exercise per Session: 0 min  Stress: Not on file  Social Connections: Not on file  Intimate Partner Violence: Not At Risk (05/30/2023)   Humiliation, Afraid, Rape, and Kick questionnaire    Fear of Current or Ex-Partner: No    Emotionally Abused: No    Physically Abused: No    Sexually Abused: No    FAMILY HISTORY:  Family History  Problem Relation Age of Onset   Hypertension Father    Hypertension Brother    Pulmonary embolism Brother    Pancreatic cancer Paternal Uncle     ALLERGIES:  has no known allergies.  MEDICATIONS:  Current Outpatient Medications  Medication Sig Dispense Refill   atorvastatin  (LIPITOR ) 40 MG tablet Take 2 tablets (80 mg total) by mouth at bedtime. 180 tablet 1   carvedilol  (COREG ) 6.25 MG tablet Take 1 tablet (6.25 mg total) by mouth 2 (two) times daily with a meal. 180 tablet 3   empagliflozin  (JARDIANCE ) 10 MG TABS tablet Take 1 tablet (10 mg total) by mouth daily before breakfast. 30 tablet 3   OXYGEN Inhale 4 L into the lungs at bedtime.     rivaroxaban  (XARELTO ) 20 MG TABS tablet Take 1 tablet (20 mg total) by mouth daily with supper. 90 tablet 0   sacubitril -valsartan  (ENTRESTO ) 49-51 MG Take 1 tablet by mouth 2 (two) times daily. 60 tablet 6   Semaglutide ,0.25 or 0.5MG /DOS, 2 MG/3ML SOPN Inject 0.5 mg into the skin once a week. 3 mL 1   spironolactone  (ALDACTONE ) 25 MG tablet Take 1 tablet (25 mg total) by mouth once daily. 90 tablet 2   torsemide  (DEMADEX ) 20 MG tablet Take 2 tablets (40 mg total) by mouth daily. 60 tablet 11   No current facility-administered medications for this visit.    Review of Systems  Constitutional:  Positive for fatigue. Negative for appetite change, chills, fever and  unexpected weight change.  HENT:   Negative for hearing loss and voice change.   Eyes:  Negative for eye problems and icterus.  Respiratory:  Negative for chest tightness, cough and shortness of breath.   Cardiovascular:  Negative for chest pain and leg swelling.  Gastrointestinal:  Negative for abdominal distention and abdominal pain.  Endocrine: Negative for hot flashes.  Genitourinary:  Negative for difficulty urinating, dysuria and frequency.   Musculoskeletal:  Negative for arthralgias.  Skin:  Negative for itching and rash.  Neurological:  Negative for light-headedness and numbness.  Hematological:  Negative for adenopathy. Does not bruise/bleed easily.  Psychiatric/Behavioral:  Negative for confusion.    PHYSICAL EXAMINATION:  Vitals:   06/14/23 1051  BP: 102/74  Pulse: 86  Resp: 18  Temp: (!) 97.5 F (36.4 C)   Filed Weights   06/14/23 1051  Weight: 276 lb 6.4 oz (125.4 kg)    Physical Exam Constitutional:      General: He is not in acute distress.    Appearance: He is obese.  HENT:     Head: Normocephalic and atraumatic.  Eyes:     General: No scleral icterus. Cardiovascular:     Rate and Rhythm: Normal rate and regular rhythm.     Heart sounds: Normal heart sounds.  Pulmonary:     Effort: Pulmonary effort is normal. No respiratory distress.     Breath sounds: Normal breath sounds. No wheezing.  Abdominal:     General: There is no distension.     Palpations: Abdomen is soft.  Musculoskeletal:        General: No deformity. Normal range of motion.     Cervical back: Normal range of motion and neck supple.  Skin:    General: Skin is warm and dry.     Findings: No erythema or rash.  Neurological:     Mental Status: He is alert and oriented to person, place, and time. Mental status is at baseline.  Psychiatric:  Mood and Affect: Mood normal.     LABORATORY DATA:  I have reviewed the data as listed    Latest Ref Rng & Units 05/30/2023   11:47 AM  05/17/2023   11:51 AM 03/31/2023    1:29 PM  CBC  WBC 4.0 - 10.5 K/uL 4.8  4.5    Hemoglobin 13.0 - 17.0 g/dL 62.9  52.8  41.3   Hematocrit 39.0 - 52.0 % 54.8  61.9  64.0   Platelets 150 - 400 K/uL 180  168        Latest Ref Rng & Units 05/30/2023   11:47 AM 05/17/2023   11:51 AM 04/19/2023    2:00 PM  CMP  Glucose 70 - 99 mg/dL  68  98   BUN 6 - 24 mg/dL  17  25   Creatinine 2.44 - 1.27 mg/dL  0.10  2.72   Sodium 536 - 144 mmol/L  142  138   Potassium 3.5 - 5.2 mmol/L  3.8  3.9   Chloride 96 - 106 mmol/L  96  99   CO2 20 - 29 mmol/L  26  28   Calcium  8.7 - 10.2 mg/dL  9.9  9.6   Total Protein 6.5 - 8.1 g/dL 7.4     Total Bilirubin 0.0 - 1.2 mg/dL 1.0     Alkaline Phos 38 - 126 U/L 206     AST 15 - 41 U/L 34     ALT 0 - 44 U/L 80         RADIOGRAPHIC STUDIES: I have personally reviewed the radiological images as listed and agreed with the findings in the report. CARDIAC CATHETERIZATION Result Date: 03/31/2023 Findings: RA = 8 RV = 52/16 PA = 51/27 (36) PCW = 19 Fick cardiac output/index = 5.9/2.3 Thermo CO/CI = 6.2/2.5 PVR = 3,4 WU Ao sat = 90% PA sat = 69%, 71% PAPi = 3.0 ABG on 4L O2 7.26/71/71/90% Assessment: 1. 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 Mildly increased left-sided filling pressures 3, Normal cardiac output Plan/Discussion: Volume status about as good as we are going to get it. Continue torsemide  40 daily. Can increase as needed. No role for selective pulmonary artery vasodilators at this time. Consider Pulmonary input to help with BiPAP at time of d/c. Jules Oar, MD 1:54 PM  DG Chest Port 1 View Result Date: 03/29/2023 CLINICAL DATA:  10026 Shortness of breath 10026 EXAM: PORTABLE CHEST 1 VIEW COMPARISON:  03/27/2023 FINDINGS: Endotracheal and enteric tubes have been removed. Right PICC line also removed. Stable cardiomegaly. Pulmonary vascular congestion. No focal airspace consolidation, pleural effusion, or pneumothorax. IMPRESSION: Cardiomegaly with pulmonary  vascular congestion. Electronically Signed   By: Leverne Reading D.O.   On: 03/29/2023 12:08   DG Chest Port 1 View Result Date: 03/27/2023 CLINICAL DATA:  Shortness of breath EXAM: PORTABLE CHEST 1 VIEW COMPARISON:  03/22/2023 FINDINGS: Cardiac shadow is stable. Endotracheal tube is noted just above the carina. This should be withdrawn 2-3 cm. Right PICC and gastric catheter are seen. The tip of the gastric catheter is not well appreciated on this exam. Lungs are hypoinflated with mild vascular congestion. No sizable effusion is noted. IMPRESSION: Endotracheal tube at the level of the carina. This should be withdrawn 2-3 cm. These results will be called to the ordering clinician or representative by the Radiologist Assistant, and communication documented in the PACS or [employer]. Electronically Signed   By: Violeta Grey M.D.   On: 03/27/2023 10:55  DG Abd 1 View Result Date: 03/22/2023 CLINICAL DATA:  48 year old male enteric tube placement. EXAM: ABDOMEN - 1 VIEW COMPARISON:  03/21/2023 and earlier. FINDINGS: Portable AP semi upright view at 0859 hours. Enteric tube placed into the stomach and terminates across midline to the right at the level of the gastric antrum. Compared to portable chest x-ray 0308 hours today there is bilateral dense lung base opacity. Nonobstructed visible bowel gas pattern. IMPRESSION: 1. Satisfactory enteric tube placement into the distal stomach. 2. Confluent bilateral lung base opacity. Electronically Signed   By: Marlise Simpers M.D.   On: 03/22/2023 10:05   DG Chest Port 1 View Result Date: 03/22/2023 CLINICAL DATA:  Acute respiratory failure with hypoxia. EXAM: PORTABLE CHEST 1 VIEW COMPARISON:  03/21/2023 FINDINGS: Infected tube tip is 5.2 cm above the base of the carina. Low lung volumes with vascular congestion. Bibasilar collapse/consolidation is similar to prior. The cardio pericardial silhouette is enlarged. Telemetry leads overlie the chest. IMPRESSION: Low lung  volumes with vascular congestion and bibasilar collapse/consolidation. Electronically Signed   By: Donnal Fusi M.D.   On: 03/22/2023 07:33   ECHOCARDIOGRAM COMPLETE Result Date: 03/21/2023    ECHOCARDIOGRAM REPORT   Patient Name:   Gregory Mckenzie Date of Exam: 03/21/2023 Medical Rec #:  [id number]            Height:       66.0 in Accession #:    [id number]           Weight:       343.0 lb Date of Birth:  27-Nov-1975            BSA:          2.515 m Patient Age:    47 years             BP:           113/70 mmHg Patient Gender: M                    HR:           66 bpm. Exam Location:  [facility] Procedure: 2D Echo, Cardiac Doppler, Color Doppler and Intracardiac            Opacification Agent Indications:     CHF  History:         Patient has prior history of Echocardiogram examinations, most                  recent 10/20/2022. CHF and Cardiomyopathy, Acute MI,                  Arrythmias:Atrial Flutter, Signs/Symptoms:Edema and Dyspnea;                  Risk Factors:Hypertension and Diabetes. CKD.  Sonographer:     Clarke Crouch Referring Phys:  [id number] ELIZABETH ACHIENG OUMA Diagnosing Phys: Antonette Batters MD  Sonographer Comments: Technically difficult study due to poor echo windows, no subcostal window, patient is obese and echo performed with patient supine and on artificial respirator. IMPRESSIONS  1. Left ventricular ejection fraction, by estimation, is 45 to 50%. The left ventricle has mildly decreased function. The left ventricle demonstrates global hypokinesis. There is severe concentric left ventricular hypertrophy. Left ventricular diastolic  parameters are consistent with Grade I diastolic dysfunction (impaired relaxation).  2. Right ventricular systolic function is moderately reduced. The right ventricular size is moderately enlarged. Mildly increased right ventricular wall thickness. There is mildly elevated  pulmonary artery systolic pressure.  3. Left atrial size was mildly dilated.  4. Right  atrial size was mildly dilated.  5. The mitral valve is normal in structure. No evidence of mitral valve regurgitation.  6. The aortic valve is normal in structure. Aortic valve regurgitation is not visualized. FINDINGS  Left Ventricle: Left ventricular ejection fraction, by estimation, is 45 to 50%. The left ventricle has mildly decreased function. The left ventricle demonstrates global hypokinesis. Definity  contrast agent was given IV to delineate the left ventricular  endocardial borders. The left ventricular internal cavity size was normal in size. There is severe concentric left ventricular hypertrophy. Left ventricular diastolic parameters are consistent with Grade I diastolic dysfunction (impaired relaxation). Right Ventricle: The right ventricular size is moderately enlarged. Mildly increased right ventricular wall thickness. Right ventricular systolic function is moderately reduced. There is mildly elevated pulmonary artery systolic pressure. The tricuspid regurgitant velocity is 2.67 m/s, and with an assumed right atrial pressure of 15 mmHg, the estimated right ventricular systolic pressure is 43.5 mmHg. Left Atrium: Left atrial size was mildly dilated. Right Atrium: Right atrial size was mildly dilated. Pericardium: There is no evidence of pericardial effusion. Mitral Valve: The mitral valve is normal in structure. No evidence of mitral valve regurgitation. MV peak gradient, 1.8 mmHg. The mean mitral valve gradient is 1.0 mmHg. Tricuspid Valve: The tricuspid valve is normal in structure. Tricuspid valve regurgitation is trivial. Aortic Valve: The aortic valve is normal in structure. Aortic valve regurgitation is not visualized. Aortic valve mean gradient measures 3.0 mmHg. Aortic valve peak gradient measures 6.8 mmHg. Aortic valve area, by VTI measures 2.82 cm. Pulmonic Valve: The pulmonic valve was grossly normal. Pulmonic valve regurgitation is not visualized. Aorta: The ascending aorta was not well  visualized. IAS/Shunts: No atrial level shunt detected by color flow Doppler.  LEFT VENTRICLE PLAX 2D LVIDd:         4.40 cm LVIDs:         3.30 cm LV PW:         1.70 cm LV IVS:        2.00 cm LVOT diam:     2.10 cm LV SV:         65 LV SV Index:   26 LVOT Area:     3.46 cm  RIGHT VENTRICLE RV Basal diam:  5.60 cm RV Mid diam:    4.10 cm RV S prime:     13.20 cm/s TAPSE (M-mode): 2.3 cm LEFT ATRIUM         Index       RIGHT ATRIUM           Index LA diam:    4.80 cm 1.91 cm/m  RA Area:     31.50 cm                                 RA Volume:   131.00 ml 52.08 ml/m  AORTIC VALVE                    PULMONIC VALVE AV Area (Vmax):    3.14 cm     PV Vmax:       0.98 m/s AV Area (Vmean):   2.94 cm     PV Peak grad:  3.8 mmHg AV Area (VTI):     2.82 cm AV Vmax:           130.00  cm/s AV Vmean:          81.000 cm/s AV VTI:            0.231 m AV Peak Grad:      6.8 mmHg AV Mean Grad:      3.0 mmHg LVOT Vmax:         118.00 cm/s LVOT Vmean:        68.800 cm/s LVOT VTI:          0.188 m LVOT/AV VTI ratio: 0.81  AORTA Ao Root diam: 3.40 cm MITRAL VALVE               TRICUSPID VALVE MV Area (PHT): 2.53 cm    TR Peak grad:   28.5 mmHg MV Area VTI:   2.79 cm    TR Vmax:        267.00 cm/s MV Peak grad:  1.8 mmHg MV Mean grad:  1.0 mmHg    SHUNTS MV Vmax:       0.67 m/s    Systemic VTI:  0.19 m MV Vmean:      41.7 cm/s   Systemic Diam: 2.10 cm MV Decel Time: 300 msec MV E velocity: 56.60 cm/s MV A velocity: 57.80 cm/s MV E/A ratio:  0.98 Dwayne D Callwood MD Electronically signed by Antonette Batters MD Signature Date/Time: 03/21/2023/4:48:17 PM    Final    DG Chest Port 1 View Addendum Date: 03/21/2023 ADDENDUM REPORT: 03/21/2023 11:59 ADDENDUM: Study discussed by telephone with Dr. Darnelle Elders on 03/21/2023 at 1156 hours. Electronically Signed   By: Marlise Simpers M.D.   On: 03/21/2023 11:59   Result Date: 03/21/2023 CLINICAL DATA:  48 year old male with respiratory failure. Intubated. Enteric tube placement. EXAM: PORTABLE CHEST 1  VIEW COMPARISON:  Portable abdomen today. Portable chest 0039 hours today. FINDINGS: Portable AP upright view at 1048 hours. The enteric feeding tube loops at the level of the hypopharynx and does not continue distally. Stable endotracheal tube, tip in good position between the level the clavicles and carina. Stable enlarged cardiac silhouette and bibasilar pulmonary hypo ventilation. No superimposed pneumothorax or pulmonary edema. Paucity of bowel gas. Stable visualized osseous structures. IMPRESSION: 1. Enteric feeding tube loops at the hypopharynx and does not continue distally. This should be removed and re-attempted. 2. Satisfactory ET tube. Stable bibasilar hypo ventilation, evidence of cardiomegaly. Electronically Signed: By: Marlise Simpers M.D. On: 03/21/2023 11:46   DG Abd 1 View Result Date: 03/21/2023 CLINICAL DATA:  48 year old male NG tube. EXAM: ABDOMEN - 1 VIEW COMPARISON:  05/21/2021. FINDINGS: 2 AP supine views at 1044 hours with no enteric tube identified in the lower chest or visible abdomen. Paucity of bowel gas. EKG leads and wires. IMPRESSION: No enteric tube visible at the diaphragm or in the upper abdomen. See portable chest x-ray 1048 hours reported separately. Electronically Signed   By: Marlise Simpers M.D.   On: 03/21/2023 11:44   US  EKG SITE RITE Result Date: 03/21/2023 If Site Rite image not attached, placement could not be confirmed due to current cardiac rhythm.  DG Chest Portable 1 View Result Date: 03/21/2023 CLINICAL DATA:  Intubation EXAM: PORTABLE CHEST 1 VIEW COMPARISON:  03/20/2023 FINDINGS: Endotracheal tube tip is just below the level of the clavicular heads. Unchanged cardiomegaly and pulmonary vascular congestion. IMPRESSION: Endotracheal tube tip just below the level of the clavicular heads. Electronically Signed   By: Juanetta Nordmann M.D.   On: 03/21/2023 01:22   DG Chest 2 View Result  Date: 03/20/2023 CLINICAL DATA:  Low O2 sats, weight gain. EXAM: CHEST - 2 VIEW COMPARISON:   05/25/2021 FINDINGS: Cardiomegaly, vascular congestion. No overt edema, confluent opacities or effusions. No acute bony abnormality. IMPRESSION: Cardiomegaly, vascular congestion. Electronically Signed   By: Janeece Mechanic M.D.   On: 03/20/2023 11:21

## 2023-06-14 NOTE — Progress Notes (Signed)
 PCP: Pcp, No HF Cardiology: Dr. Mitzie Anda  Chief complaint: CHF  48 y.o. with history of CKD stage 3, type 2 diabetes, HTN, paroxysmal atrial fibrillation, OHS/OSA, and chronic HF with mid range EF and prominent RV dysfunction returns for followup of CHF. Patient had LHC in 2022 with mild nonobstructive CAD.  He is on home oxygen 2L and has sleep apnea but does not yet have CPAP. Patient was admitted in 2/25 with volume overload and hypoxemic respiratory failure.  He was intubated and diuresed.   Weight is up 6 lbs.  He is using oxygen at night but not during day anymore as oxygen saturation has been > 90%.  Still waiting on sleep study, planned for 5/8.  He gets short of breath walking "a long way" or walking up stairs. No chest pain.  No orthopnea/PND. No palpitations.   ECG (personally reviewed): NSR, PVCs, LAFB  Labs (3/25): K 3.9, creatinine 1.18 Labs (4/25): hgb 17.6, AST 34, ALT 80, TGs 206, LDL 81, BNP 87, K 3.8, creatinine 1.39  PMH: 1. CKD stage 3 2. Type 2 diabetes 3. HTN 4. Atrial fibrillation: Paroxysmal 5. OHS/OSA: Chronic hypoxemic respiratory failure on home oxygen (now using only at night). Trying to get CPAP.  6. HF with mid range EF: Echo (2/25): EF 45-50%, severe LVH, moderate RV enlargement with moderate RV dysfunction.  - LHC (5/22): Mild nonobstructive CAD.  - RHC (2/25): mean RA 8, PA 51/27, mean PCWP 19, CI 2.3 F/2.5 T.  PVR 2.4 WU, PAPi 3.  7. Hyperlipidemia 8. Polycythemia: Likely due to chronic hypoxemia - JAK2 mutation negative.   Family History  Problem Relation Age of Onset   Hypertension Father    Hypertension Brother    Pulmonary embolism Brother    Pancreatic cancer Paternal Uncle    Social History   Socioeconomic History   Marital status: Single    Spouse name: Not on file   Number of children: Not on file   Years of education: Not on file   Highest education level: Not on file  Occupational History   Not on file  Tobacco Use   Smoking  status: Former    Current packs/day: 0.00    Average packs/day: 1 pack/day for 26.9 years (26.9 ttl pk-yrs)    Types: Cigarettes    Start date: 04/06/1993    Quit date: 2022    Years since quitting: 3.3   Smokeless tobacco: Never  Vaping Use   Vaping status: Never Used  Substance and Sexual Activity   Alcohol  use: Not Currently   Drug use: Not Currently    Frequency: 7.0 times per week    Types: Marijuana    Comment: daily   Sexual activity: Yes    Partners: Female    Birth control/protection: None  Other Topics Concern   Not on file  Social History Narrative   Not on file   Social Drivers of Health   Financial Resource Strain: Low Risk  (04/09/2020)   Overall Financial Resource Strain (CARDIA)    Difficulty of Paying Living Expenses: Not very hard  Food Insecurity: No Food Insecurity (05/30/2023)   Hunger Vital Sign    Worried About Running Out of Food in the Last Year: Never true    Ran Out of Food in the Last Year: Never true  Transportation Needs: No Transportation Needs (05/30/2023)   PRAPARE - Administrator, Civil Service (Medical): No    Lack of Transportation (Non-Medical): No  Physical Activity:  Inactive (04/09/2020)   Exercise Vital Sign    Days of Exercise per Week: 0 days    Minutes of Exercise per Session: 0 min  Stress: Not on file  Social Connections: Not on file  Intimate Partner Violence: Not At Risk (05/30/2023)   Humiliation, Afraid, Rape, and Kick questionnaire    Fear of Current or Ex-Partner: No    Emotionally Abused: No    Physically Abused: No    Sexually Abused: No   ROS: All systems reviewed and negative except as per HPI.   Current Outpatient Medications  Medication Sig Dispense Refill   atorvastatin  (LIPITOR ) 40 MG tablet Take 2 tablets (80 mg total) by mouth at bedtime. 180 tablet 1   carvedilol  (COREG ) 6.25 MG tablet Take 1 tablet (6.25 mg total) by mouth 2 (two) times daily with a meal. 180 tablet 3   empagliflozin   (JARDIANCE ) 10 MG TABS tablet Take 1 tablet (10 mg total) by mouth daily before breakfast. 30 tablet 3   OXYGEN Inhale 4 L into the lungs at bedtime.     rivaroxaban  (XARELTO ) 20 MG TABS tablet Take 1 tablet (20 mg total) by mouth daily with supper. 90 tablet 0   sacubitril -valsartan  (ENTRESTO ) 49-51 MG Take 1 tablet by mouth 2 (two) times daily. 60 tablet 6   Semaglutide ,0.25 or 0.5MG /DOS, 2 MG/3ML SOPN Inject 0.5 mg into the skin once a week. 3 mL 1   spironolactone  (ALDACTONE ) 25 MG tablet Take 1 tablet (25 mg total) by mouth once daily. 90 tablet 2   torsemide  (DEMADEX ) 20 MG tablet Take 2 tablets (40 mg total) by mouth daily. 60 tablet 11   No current facility-administered medications for this visit.   BP 98/72   Pulse 86   Wt 278 lb (126.1 kg)   SpO2 92%   BMI 44.87 kg/m  General: NAD Neck: Thick, no JVD, no thyromegaly or thyroid nodule.  Lungs: Clear to auscultation bilaterally with normal respiratory effort. CV: Nondisplaced PMI.  Heart regular S1/S2, no S3/S4, no murmur.  No peripheral edema.  No carotid bruit.  Normal pedal pulses.  Abdomen: Soft, nontender, no hepatosplenomegaly, no distention.  Skin: Intact without lesions or rashes.  Neurologic: Alert and oriented x 3.  Psych: Normal affect. Extremities: No clubbing or cyanosis.  HEENT: Normal.   Assessment/Plan:  1. Chronic HF with mid-range EF with prominent RV dysfunction: Significant RV dysfunction in the setting of OHS/OSA. Echo in 2/25 showed EF 45-50%, severe LVH, moderate RV enlargement and moderate RV dysfunction, PASP 44 mmHg. Cath in 5/22 showed mild nonobstructive coronary disease.  NYHA class II, Not volume overloaded by exam.  - Continue home oxygen at night and will need to get on CPAP for appropriate treatment of OHS/OSA to improve RV function.  Sleep study on 5/8.  - Continue torsemide  40 mg daily.  BMET/BNP today.  - Continue spironolactone  25 mg daily.  - Continue Jardiance  10 mg daily.  - Continue  Coreg  6.25 mg bid.  No BP room to increase.  - Continue Entresto  to 49/51 bid.  No BP room to increase.  - Repeat echo once patient is regularly using CPAP.  2. 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   - Not candidate for pulmonary vasodilators.  3. OHS/OSA: Suspect this contributes to RV failure. He is on 2 L home oxygen at night.  - Needs sleep study to get CPAP  again, should be on 5/8.  4. CKD stage 3: BMET today.  5. Polycythemia: Likely due to chronic hypoxemia. Saw hematology, JAK2 mutation negative.  - Home O2 currently, needs CPAP.  6. Atrial fibrillation: Paroxysmal.  No palpitations, regular rhythm today.   - Continue Xarelto . CBC tdoay.  7. Obesity: Continue semaglutide .   Followup 3 months with me.   I spent 32 minutes reviewing records, interviewing/examining patient, and managing orders.   Gregory Mckenzie 06/14/2023

## 2023-06-14 NOTE — Assessment & Plan Note (Addendum)
 Chronic erythrocytosis. Differential includes primary myeloproliferative disorder or secondary causes like untreated sleep apnea and hypoxia etc. 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  He is going to have sleep study done. hematocrit is less than 55.  I will hold off emergent phlebotomy

## 2023-06-14 NOTE — Assessment & Plan Note (Signed)
 Possibly due to fatty liver disease.  Check right upper quadrant ultrasound.  Check hepatitis panel at the next visit.

## 2023-06-14 NOTE — Assessment & Plan Note (Signed)
 Recommend patient follow-up with pulmonology and get a sleep study for evaluation.

## 2023-06-14 NOTE — Patient Instructions (Addendum)
 Lab Work:  Go DOWN to LOWER LEVEL (LL) to have your blood work completed inside of [employer] office.  We will only call you if the results are abnormal or if the provider would like to make medication changes.   Follow-Up in: 3 MONTHS WITH Shawnee Dellen, FNP.  Our Doctors' schedules are NOT open yet for 3 months. We will place you on our recall list. Once they are available, we will call you to schedule your follow up appointment.   At the [facility], you and your health needs are our priority. We have a designated team specialized in the treatment of Heart Failure. This Care Team includes your primary Heart Failure Specialized Cardiologist (physician), Advanced Practice Providers (APPs- Physician Assistants and Nurse Practitioners), and Pharmacist who all work together to provide you with the care you need, when you need it.   You may see any of the following providers on your designated Care Team at your next follow up:  Dr. Jules Oar Dr. Peder Bourdon Dr. Alwin Baars Dr. Judyth Nunnery Shawnee Dellen, FNP Bevely Brush, RPH-CPP  Please be sure to bring in all your medications bottles to every appointment.   Need to Contact Us :  If you have any questions or concerns before your next appointment please send us  a message through [place] or call our office at [phone].    TO LEAVE A MESSAGE FOR THE NURSE SELECT OPTION 2, PLEASE LEAVE A MESSAGE INCLUDING: YOUR NAME DATE OF BIRTH CALL BACK NUMBER REASON FOR CALL**this is important as we prioritize the call backs  YOU WILL RECEIVE A CALL BACK THE SAME DAY AS LONG AS YOU CALL BEFORE 4:00 PM

## 2023-06-16 ENCOUNTER — Other Ambulatory Visit: Payer: Self-pay

## 2023-06-21 ENCOUNTER — Ambulatory Visit: Attending: Oncology

## 2023-06-22 ENCOUNTER — Ambulatory Visit (HOSPITAL_BASED_OUTPATIENT_CLINIC_OR_DEPARTMENT_OTHER): Attending: Pulmonary Disease | Admitting: Pulmonary Disease

## 2023-06-22 DIAGNOSIS — E662 Morbid (severe) obesity with alveolar hypoventilation: Secondary | ICD-10-CM

## 2023-06-22 DIAGNOSIS — G4733 Obstructive sleep apnea (adult) (pediatric): Secondary | ICD-10-CM | POA: Diagnosis present

## 2023-06-22 DIAGNOSIS — E669 Obesity, unspecified: Secondary | ICD-10-CM | POA: Insufficient documentation

## 2023-06-22 DIAGNOSIS — Z6841 Body Mass Index (BMI) 40.0 and over, adult: Secondary | ICD-10-CM | POA: Diagnosis not present

## 2023-06-22 DIAGNOSIS — I50810 Right heart failure, unspecified: Secondary | ICD-10-CM | POA: Diagnosis not present

## 2023-06-23 ENCOUNTER — Other Ambulatory Visit: Payer: Self-pay

## 2023-06-23 MED FILL — Carvedilol Tab 6.25 MG: ORAL | 30 days supply | Qty: 60 | Fill #6 | Status: AC

## 2023-06-23 MED FILL — Rivaroxaban Tab 20 MG: ORAL | 30 days supply | Qty: 30 | Fill #2 | Status: AC

## 2023-06-27 ENCOUNTER — Ambulatory Visit (HOSPITAL_COMMUNITY): Payer: Self-pay | Admitting: Cardiology

## 2023-06-27 ENCOUNTER — Other Ambulatory Visit: Payer: Self-pay

## 2023-06-27 LAB — COMPREHENSIVE METABOLIC PANEL WITH GFR
ALT: 90 IU/L — ABNORMAL HIGH (ref 0–44)
AST: 41 IU/L — ABNORMAL HIGH (ref 0–40)
Albumin: 4.5 g/dL (ref 4.1–5.1)
Alkaline Phosphatase: 242 IU/L — ABNORMAL HIGH (ref 44–121)
BUN/Creatinine Ratio: 18 (ref 9–20)
BUN: 24 mg/dL (ref 6–24)
Bilirubin Total: 0.7 mg/dL (ref 0.0–1.2)
CO2: 21 mmol/L (ref 20–29)
Calcium: 9.8 mg/dL (ref 8.7–10.2)
Chloride: 99 mmol/L (ref 96–106)
Creatinine, Ser: 1.31 mg/dL — ABNORMAL HIGH (ref 0.76–1.27)
Globulin, Total: 2.9 g/dL (ref 1.5–4.5)
Glucose: 101 mg/dL — ABNORMAL HIGH (ref 70–99)
Potassium: 4.2 mmol/L (ref 3.5–5.2)
Sodium: 139 mmol/L (ref 134–144)
Total Protein: 7.4 g/dL (ref 6.0–8.5)
eGFR: 67 mL/min/{1.73_m2} (ref >59)

## 2023-06-27 LAB — CBC
Hematocrit: 53.2 % — ABNORMAL HIGH (ref 37.5–51.0)
Hemoglobin: 17.6 g/dL (ref 13.0–17.7)
MCH: 28.8 pg (ref 26.6–33.0)
MCHC: 33.1 g/dL (ref 31.5–35.7)
MCV: 87 fL (ref 79–97)
Platelets: 173 10*3/uL (ref 150–450)
RBC: 6.11 x10E6/uL — ABNORMAL HIGH (ref 4.14–5.80)
RDW: 18.1 % — ABNORMAL HIGH (ref 11.6–15.4)
WBC: 4.8 10*3/uL (ref 3.4–10.8)

## 2023-06-27 LAB — BRAIN NATRIURETIC PEPTIDE

## 2023-07-04 ENCOUNTER — Telehealth: Payer: Self-pay | Admitting: Pulmonary Disease

## 2023-07-04 DIAGNOSIS — G4733 Obstructive sleep apnea (adult) (pediatric): Secondary | ICD-10-CM

## 2023-07-04 NOTE — Procedures (Signed)
 [facility] [address] [place], [place] [address] Tel: [phone]   Fax: [phone]  Split Night Interpretation  Patient Name:  Gregory Mckenzie, Gregory Mckenzie Study Date:  06/22/2023 Referring Physician:  Myer Artis, MD  Indications for Polysomnography The patient is a 48 year old Male who is 5\' 6"  and weighs 274.0 lbs.  His BMI equals 44.6.  A diagnostic polysomnogram was performed to evaluate for -.  After 130.0 minutes of sleep time the patient exhibited sufficient respiratory events qualifying him for a CPAP trial which was then initiated.    Medication was administered at 2210  ATORVASTATIN   CARVEDILOL   ENTRESTO   XARELTO    Polysomnogram Data A full night polysomnogram was performed recording the standard physiologic parameters including EEG, EOG, EMG, EKG, nasal and oral airflow.  Respiratory parameters of chest and abdominal movements are recorded with [place] motion transducers.  Oxygen saturation was recorded by pulse oximetry.    Sleep Architecture The total recording time of the diagnostic portion of the study was 153.1 minutes.  The total sleep time was 130.0 minutes.  During the diagnostic portion of the study, the patient spent 1.9% of total sleep time in Stage N1, 68.1% in Stage N2, 0.0% in Stages N3, and 30.0% in REM.   Sleep latency was 18.1 minutes.  REM latency was 52.0 minutes.  Sleep Efficiency was 84.9%.  Wake after Sleep Onset time was 5.0 minutes.   At 01:16:34 AM the patient was placed on PAP treatment and was titrated at pressures ranging from 7* cm/H20 with supplemental oxygen at - up to 16* cm/H20 with supplemental oxygen at -.  The total recording time of the treatment portion of the study was 268.8 minutes.  The total sleep time was 223.0 minutes.  During the treatment portion of the study, the patient spent 6.1% of total sleep time in Stage N1, 52.0% in Stage N2, 0.0% in Stages N3, and 41.9% in REM.   Sleep latency was 5.0  minutes.  REM latency was 43.0 minutes.  Sleep Efficiency was 83.0%.  Wake after Sleep Onset time was 40.5 minutes.  Respiratory Events During the diagnostic portion of the study, the polysomnogram revealed a presence of - obstructive, - central, and - mixed apneas resulting in an Apnea index of - events per hour.  There were 201 hypopneas (>=3% desaturation and/or arousal) resulting in an Apnea\Hypopnea Index (AHI >=3% desaturation and/or arousal) of 92.8 events per hour.  There were 193 hypopneas (>=4% desaturation) resulting in an Apnea\Hypopnea Index (AHI >=4% desaturation) of 89.1 events per hour.  There were - Respiratory Effort Related Arousals resulting in a RERA index of - events per hour. The Respiratory Disturbance Index is 92.8 events per hour.  The snore index was - events per hour.  Mean oxygen saturation was 76.9%.  The lowest oxygen saturation during sleep was 58.0%.  Time spent <=88% oxygen saturation was 139.0 minutes (92.6%).  End Tidal CO2 during sleep ranged from - to - mmHg. End Tidal CO2 was greater than 50 mmHg for - minutes and greater than 55 mmHg for - minutes.  During the treatment portion of the study, the polysomnogram revealed a presence of 12 obstructive, - central, and 1 mixed apneas resulting in an Apnea index of 3.5 events per hour.  There were 65 hypopneas (>=3% desaturation and/or arousal) resulting in an Apnea\Hypopnea Index (AHI >=3% desaturation and/or arousal) of 21.0 events per hour.  There were 43 hypopneas (>=4% desaturation) resulting in an Apnea\Hypopnea Index (  AHI >=4% desaturation) of 15.1 events per hour.  There were - Respiratory Effort Related Arousals resulting in a RERA index of - events per hour. The Respiratory Disturbance Index is 21.0 events per hour.  The snore index was - events per hour.  Mean oxygen saturation was 88.2%.  The lowest oxygen saturation during sleep was 77.0%.  Time spent <=88% oxygen saturation was 156.3 minutes (58.3%).  Limb  Activity During the diagnostic portion of the study, there were - limb movements recorded.  Of this total, - were classified as PLMs.  Of the PLMs, - were associated with arousals.  The Limb Movement index was - per hour while the PLM index was - per hour.  During the treatment portion of the study, there were 56 limb movements recorded.  Of this total, 53 were classified as PLMs.  Of the PLMs, 3 were associated with arousals.  The Limb Movement index was 15.1 per hour while the PLM index was 14.3 per hour.  Cardiac Summary During the diagnostic portion of the study, the average pulse rate was 68.8 bpm.  The minimum pulse rate was 46.0 bpm while the maximum pulse rate was 98.0 bpm.  During the treatment portion of the study, the average pulse rate was 66.9 bpm.  The minimum pulse rate was 41.0 bpm while the maximum pulse rate was 100.0 bpm.   Diagnosis:  Severe obstructive sleep apnea, AHI of 92.8 Successful titration to CPAP therapy Tolerated CPAP therapy well Excellent sleep efficiency No significant dysrhythmia No significant periodic limb movement of sleep  Recommendations: CPAP therapy with pressure settings of 16 cm of water  with EPR of 2, with heated humidification. Large ResMed AirFit F40 fullface mask was used for titration  Encourage weight loss measures Close clinical follow-up with compliance monitoring for optimization of therapy  This study was personally reviewed and electronically signed by: Myer Artis, MD Accredited Board Certified in Sleep Medicine Date/Time: 07/04/2023        Split Night Report  Patient Name: Gregory Mckenzie Study Date: 06/22/2023  Date of Birth: January 31, 1976 Study Type: CPAP Titration  Age: 45 year MRN #: [id number]  Sex: Male Interpreting Physician: Myer Artis [id number]  Height: 5\' 6"  Referring Physician: Myer Artis, MD  Weight: 274.0 lbs Recording Tech: Odella Bending RPSGT RST  BMI: 44.6 Scoring Tech: Odella Bending RPSGT  RST  ESS: 19 Neck Size: 19.5  Mask Type REMED AIRFIT F40 Final Pressure: 16  Mask Size: LARGE Supplemental O2: N/A   Study Overview  DIAGNOSTIC TREATMENT  Lights Off: 10:43:11 PM Lights Off: 01:16:15 AM  Lights On: 01:16:15 AM Lights On: 05:45:01 AM  Time in Bed: 153.1 min. Time in Bed: 268.8 min.  Total Sleep Time: 130.0 min. Total Sleep Time: 223.0 min.  Sleep Efficiency: 84.9% Sleep Efficiency: 83.0%  Sleep Latency: 18.1 min. Sleep Latency: 5.0 min.  REM Latency from Sleep Onset: 52.0 min. REM Latency from Sleep Onset: 43.0 min.  Wake After Sleep Onset: 5.0 min. Wake After Sleep Onset: 40.5 min.   DIAGNOSTIC TREATMENT   Count Index  Count Index  Awakenings: 3 1.4 Awakenings: 23 6.2  Arousals: 190 87.7 Arousals: 49 13.2  AHI (>=3% Desat and/or Ar.): 201 92.8 AHI (>=3% Desat and/or Ar.): 78 21.0  AHI (>=4% Desat): 193 89.1 AHI (>=4% Desat): 56 15.1   Limb Movements: - - Limb Movements: 56 15.1  Snore: - - Snore: - -  Desaturations: 200 92.3 Desaturations: 100 26.9  Minimum SpO2 TST: 58.0% Minimum SpO2 TST: 77.0%  Sleep Architecture   DIAGNOSTIC TREATMENT ENTIRE NIGHT  Stages Time (mins) % Sleep Time Time (mins) % Sleep Time Time (mins) % Sleep Time  Wake 23.5  46.0  69.5   Stage N1 2.5 1.9% 13.5 6.1% 16.0 4.5%  Stage N2 88.5 68.1% 116.0 52.0% 204.5 57.9%  Stage N3 0.0 0.0% 0.0 0.0% 0.0 0.0%  REM 39.0 30.0% 93.5 41.9% 132.5 37.5%   Arousal Summary   DIAGNOSTIC TREATMENT   NREM REM TST Index NREM REM TST Index  Respiratory Ar. 133 56 189 87.2 23 - 23 6.2  PLM Ar. - - - - 2 1 3  0.8  Isolated Limb Movement Ar. - - - - - - - -  Snore Ar. - - - - - - - -  Spontaneous Ar. - 1 1 0.5 16 7 23  6.2  Total Ar. 133 57 190 87.7 41 8 49 13.2    Respiratory Summary  DIAGNOSTIC By Sleep Stage By Body Position Total   NREM REM Supine Non-Supine   Time (min) 91.0 39.0 40.5 89.5 130.0         Obstructive Apnea - - - - -  Mixed Apnea - - - - -  Central Apnea - - - - -  Total  Apneas - - - - -  Total Apnea Index - - - - -         Hypopneas (>=3% Desat and/or Ar.) 144 57 59 142 201  AHI (>=3% Desat and/or Ar.) 94.9 87.7 87.4 95.2 92.8         Hypopneas (>=4% Desat) 140 53 58 135 193  AHI (>=4% Desat) 92.3 81.5 85.9 90.5 89.1          RERAs - - - - -  RERA Index - - - - -         RDI 94.9 87.7 87.4 95.2 92.8    TREATMENT By Sleep Stage By Body Position Total   NREM REM Supine Non-Supine   Time (min) 129.5 93.5 4.0 219.0 223.0         Obstructive Apnea 12 - 4 8 12   Mixed Apnea 1 - - 1 1  Central Apnea - - - - -  Total Apneas 13 - 4 9 13   Total Apnea Index 6.0 - 60.0 2.5 3.5         Hypopneas (>=3% Desat and/or Ar.) 35 30 - 65 65  AHI (>=3% Desat and/or Ar.) 22.2 19.3 60.0 20.3 21.0         Hypopneas (>=4% Desat) 29 14 - 43 43  AHI (>=4% Desat) 19.5 9.0 60.0 14.2 15.1          RERAs - - - - -  RERA Index - - - - -         RDI 22.2 19.3 60.0 20.3 21.0    Respiratory Event Durations   DIAGNOSTIC TREATMENT  Apnea NREM REM NREM REM  Average (seconds) - - 20.8 -  Maximum (seconds) - - 33.2 -  Hypopnea      Average (seconds) 28.8 32.7 21.0 20.3  Maximum (seconds) 41.6 61.7 32.1 39.3    Limb Movement Summary   DIAGNOSTIC TREATMENT   Count Index Count Index  Isolated Limb Movements - - 3 0.8  Periodic Limb Movements (PLMs) - - 53 14.3  Total Limb Movements - - 56 15.1    Oxygen Saturation Summary   DIAGNOSTIC TREATMENT   Wake NREM REM TST Wake NREM REM TST  Average SpO2 86.8% 76.2% 73.3% 75.3% 87.7% 88.0% 88.5% 88.2%  Minimum SpO2 64.0% 62.0% 58.0% 58.0%  76.0% 77.0% 83.0% 77.0%   Maximum SpO2 95.0% 93.0% 92.0% 93.0%  97.0% 96.0% 96.0% 96.0%    DIAGNOSTIC Oxygen Saturation Distribution  Range (%) Time in range (min) Time in range (%)   90.0 - 100.0 3.7 2.5%  80.0 - 90.0 49.0 32.6%  70.0 - 80.0 60.5 40.3%  60.0 - 70.0 36.3 24.2%  50.0 - 60.0 0.7 0.4%  0.0 - 50.0 - -  Time Spent <=88% SpO2  Range (%) Time in range (min) Time in  range (%)  0.0 - 88.0 139.0 92.6%      Count Index  Desaturations: 200 92.3   TREATMENT Oxygen Saturation Distribution  Range (%) Time in range (min) Time in range (%)   90.0 - 100.0 43.1 16.1%  80.0 - 90.0 220.5 82.3%  70.0 - 80.0 4.4 1.7%  60.0 - 70.0 - -  50.0 - 60.0 - -  0.0 - 50.0 - -  Time Spent <=88% SpO2  Range (%) Time in range (min) Time in range (%)  0.0 - 88.0 156.3 58.3%      Count Index  Desaturations: 100 26.9     Cardiac Summary   DIAGNOSTIC TREATMENT   Wake NREM REM Total Wake NREM REM Total  Average Pulse Rate (BPM) 79.9 66.7 68.2 68.8 72.4 67.7 63.3 66.9  Minimum Pulse Rate (BPM) 59.0 51.0 46.0 46.0 46.0 43.0 41.0 41.0  Maximum Pulse Rate (BPM) 95.0 94.0 98.0 98.0 100.0 86.0 89.0 100.0   Pulse Rate Distribution   DIAGNOSTIC  Range (bpm) Time in range (min) Time in range (%)  0.0 - 40.0 - -  40.0 - 60.0 30.9 20.5%  60.0 - 80.0 97.5 64.8%  80.0 - 100.0 21.6 14.3%  100.0 - 120.0 - -  120.0 - 140.0 - -  140.0 - 200.0 - -   TREATMENT  Range (bpm) Time in range (min) Time in range (%)  0.0 - 40.0 - -  40.0 - 60.0 48.3 18.0%  60.0 - 80.0 209.3 77.9%  80.0 - 100.0 10.4 3.9%  100.0 - 120.0 - -  120.0 - 140.0 - -  140.0 - 200.0 - -   EtCO2 Summary - Diagnostic  Stage Min (mmHg) Average (mmHg) Max (mmHg)  Wake - - -  NREM(1+2+3) - - -  REM - - -   EtCO2 Distribution:  Range (mmHg) Time in range (min) Time in range (%)  20.0 - 40.0 - -  40.0 - 50.0 - -  50.0 - 100.0 - -  55.0 - 100.0 - -  Excluded data <20.0 & >65.0 153.5 100.0%   Titration Summary  PAP Device PAP Level O2 Level Time (min) Wake (min) NREM (min) REM (min) Sleep Eff% OA# CA# MA# Hyp# (>=3%) AHI (>=3%) Hyp# (>=4%) AHI (>=%4) RERA RDI OSat <=88% (min) Min [employer] Ar. Index  - Off - 153.5 23.5 91.0 39.0 84.7% - - - 201 92.8 193  89.1 -  92.8  125.5 58.0 75.3 87.7  CPAP 7 - 25.0 22.0 3.0 0.0 12.0% 3 - - - 60.0 -  60.0 -  60.0  1.8 82.0 87.2 120.0  CPAP 9 - 19.0  8.5 10.5 0.0 55.3% 7 - - 2 51.4 2  51.4 -  51.4  7.7 77.0 87.0 57.1  CPAP 11 - 11.5 0.0 4.0 7.5 100.0% - - - 3 15.7 -  - -  15.7  9.6 83.0 87.7 -  CPAP 12 - 32.5 0.0 2.0 30.5 100.0% - - - 8 14.8 2  3.7 -  14.8  24.4 83.0 87.2 3.7  CPAP 13 - 32.0 1.5 17.5 13.0 95.3% - - - 6 11.8 2  3.9 -  11.8  15.3 84.0 88.4 9.8  CPAP 14 - 90.0 12.0 59.5 18.5 86.7% 2 - 1 34 28.5 28  23.8 -  28.5  57.2 78.0 87.9 14.6  CPAP 15 - 33.0 0.0 9.0 24.0 100.0% - - - 8 14.5 5  9.1 -  14.5  1.1 87.0 90.5 7.3  CPAP 16 - 26.0 2.0 24.0 0.0 92.3% - - - 4 10.0 4  10.0 -  10.0  14.8 85.0 88.5 7.5    Hypnograms                           Technologist Comments  THE 7-YEAR-OLD MALE PATIENT PRESENTED TO THE SLEEP DISORDER CENTER FOR A SPLIT NIGHT STUDY WITH A CHIEF COMPLAINT OF OSA. THE PATIENT WAS FITTED WITH A LARGE RESMED AIRFIT F40 FULL FACE MASK FOR PRE-CPAP TRIAL, WHICH WAS TOLERATED WELL. THE PATIENT MET SPLIT NIGHT PROTOCAL. ALL BEDTIME MEDICATIONS WERE SELF ADMINISTERED AT 2210. THE LEAD PLACEMENT WAS INITIATED, THEN THE STUDY WAS BEGUN. THE CPAP TRIAL WAS INITIATED USING THE ABOVE MASK TYPE ON A PRESSURE OF 7cmH2O AND WAS TITRATED TO 16 WITH AN EPR OF 2 AND HEATED HUMIDIFICATION. THE CPAP PRESSURE WAS INCREASED FOR SNORING, RESPIRATORY EVENTS AND RERAs UNTIL OPTIMAL PRESSURE WAS ACHIEVED. SUPPLEMENTAL OXYGEN WAS NOT WARRANTED DURING THE STUDY. PLMs - PLMAs WERE NOTED. NO RESTROOM VISITS WERE MADE. NO OBVIOUS PARASOMNIAs WERE OBSERVED. QUESTIONABLE CARDIAC ARRHYTHMIAS WERE OBSERVED ON EPOCHS 33, 35, 36, 92, AND THROUGHOUT THE STUDY. THE PATIENT WAS NOTED BEING A MOUTHBREATHER WHICH CAUSED THE MASK TO RIDE UP BETWEEN HIS LIP CAUSING A LEAK AND DESATURATION IN HIS SPO2.  THE PATIENT WAS FITTED WITH A LARGER CUSHION WHICH WAS INCLUDED IN THE FITPACK.   THE PATIENT TOLERATED THE CPAP TRIAL WELL.

## 2023-07-04 NOTE — Telephone Encounter (Signed)
 Lm x1 for patient.

## 2023-07-04 NOTE — Telephone Encounter (Signed)
 Call patient  Sleep study result  Date of study: 06/22/2023  Impression: Severe obstructive sleep apnea with AHI of 92.8 Successfully titrated CPAP therapy  Recommendation: DME referral  Recommend CPAP therapy for severe obstructive sleep apnea  CPAP therapy with pressure settings of 16 cm of water  with EPR of 2, with heated humidification. Large ResMed AirFit F40 fullface mask was used for titration  Encourage weight loss measures  Follow-up in the office 4 to 6 weeks following initiation of treatment

## 2023-07-05 ENCOUNTER — Telehealth: Payer: Self-pay | Admitting: *Deleted

## 2023-07-05 DIAGNOSIS — G4733 Obstructive sleep apnea (adult) (pediatric): Secondary | ICD-10-CM

## 2023-07-05 NOTE — Telephone Encounter (Signed)
 Called patient and spoke with patient, provided sleep study results per Dr. Gaynell Keeler.   Patient agreeable to an order being placed for a CPAP machine.  Advised to call the office once he receives his CPAP machine to schedule his f/u.  N othing further needed.

## 2023-07-05 NOTE — Telephone Encounter (Signed)
 Copied from CRM [id number]. Topic: Clinical - Lab/Test Results >> Jul 04, 2023  2:49 PM Justina Oman C wrote: Reason for CRM: Patient [phone] is returning the office call, no message was left and patient is unsure of the call. Please advise and call back.  Called patient and spoke with patient, provided sleep study results per Dr. Gaynell Keeler:  Call patient   Sleep study result   Date of study: 06/22/2023   Impression: Severe obstructive sleep apnea with AHI of 92.8 Successfully titrated CPAP therapy   Recommendation: DME referral   Recommend CPAP therapy for severe obstructive sleep apnea   CPAP therapy with pressure settings of 16 cm of water  with EPR of 2, with heated humidification. Large ResMed AirFit F40 fullface mask was used for titration   Encourage weight loss measures   Follow-up in the office 4 to 6 weeks following initiation of treatment      Patient agreeable to an order being placed for a CPAP machine.  Advised to call the office once he receives his CPAP machine to schedule his f/u.  N othing further needed.

## 2023-07-11 ENCOUNTER — Telehealth: Payer: Self-pay

## 2023-07-11 NOTE — Telephone Encounter (Signed)
 Pt is scheduled to see Dr Gaynell Keeler tomorrow, 07-12-23 at 10:15 am for a follow up. Pt just had his CPAP ordered and has not had time to use his CPAP long enough for a compliance. Pt may need to have his appointment pushed out so he can have time to use the machine.   Dr Gaynell KeelerArlie Lain, Okay to push out appointment?  Front desk- please reschedule appointment if okay with the provider.

## 2023-07-12 ENCOUNTER — Ambulatory Visit: Admitting: Pulmonary Disease

## 2023-07-23 ENCOUNTER — Other Ambulatory Visit: Payer: Self-pay | Admitting: Family

## 2023-07-23 MED FILL — Carvedilol Tab 6.25 MG: ORAL | 30 days supply | Qty: 60 | Fill #7 | Status: AC

## 2023-07-24 ENCOUNTER — Other Ambulatory Visit: Payer: Self-pay

## 2023-07-24 ENCOUNTER — Other Ambulatory Visit: Payer: Self-pay | Admitting: Family

## 2023-07-27 ENCOUNTER — Other Ambulatory Visit: Payer: Self-pay

## 2023-07-27 ENCOUNTER — Other Ambulatory Visit (HOSPITAL_COMMUNITY): Payer: Self-pay | Admitting: Cardiology

## 2023-07-28 ENCOUNTER — Other Ambulatory Visit: Payer: Self-pay

## 2023-07-28 MED FILL — Rivaroxaban Tab 20 MG: ORAL | 90 days supply | Qty: 90 | Fill #0 | Status: AC

## 2023-08-07 IMAGING — DX DG CHEST 1V PORT
1 series · 1 of 1 positions shown · non-contrast
Comparison: Chest x-ray 05/21/2021

CLINICAL DATA: Hypoxia

EXAM:
PORTABLE CHEST 1 VIEW

[chest ap]
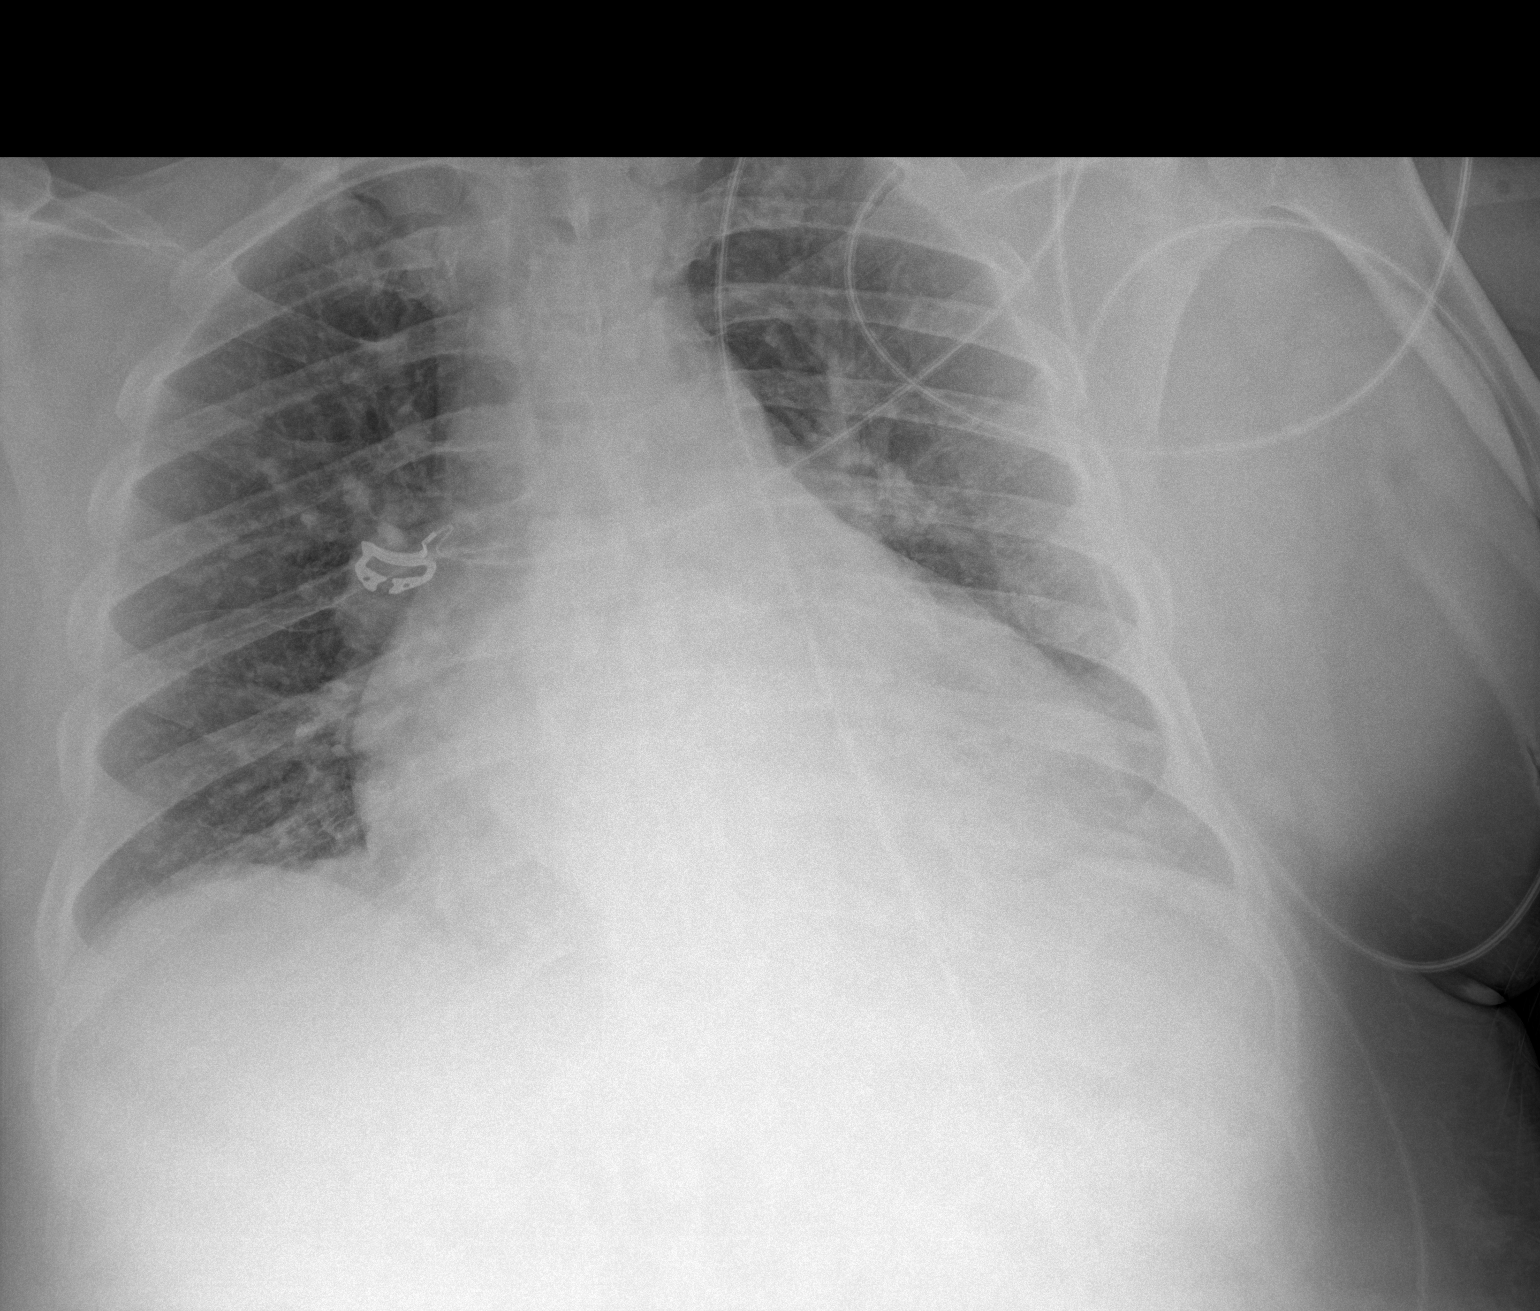

[1 of 1 positions shown; findings below may reference images not displayed]

FINDINGS: Interval extubation. Heart size and mediastinum appear stable.
Pulmonary vasculature within normal limits. Minor likely
subsegmental atelectasis at the lung bases. No new consolidation
identified. No pleural effusion or pneumothorax.
IMPRESSION: Interval extubation. Minor subsegmental atelectasis at the lung
bases.

## 2023-08-08 MED FILL — Spironolactone Tab 25 MG: ORAL | 90 days supply | Qty: 90 | Fill #1 | Status: AC

## 2023-08-24 ENCOUNTER — Other Ambulatory Visit: Payer: Self-pay

## 2023-08-24 ENCOUNTER — Other Ambulatory Visit: Payer: Self-pay | Admitting: Family

## 2023-08-24 MED ORDER — EMPAGLIFLOZIN 10 MG PO TABS
10.0000 mg | ORAL_TABLET | Freq: Every day | ORAL | 3 refills | Status: DC
  Filled 2023-08-24: qty 30, 30d supply, fill #0
  Filled 2023-09-25: qty 30, 30d supply, fill #1
  Filled 2023-10-26: qty 30, 30d supply, fill #2
  Filled 2023-11-29: qty 30, 30d supply, fill #3

## 2023-08-28 ENCOUNTER — Other Ambulatory Visit: Payer: Self-pay

## 2023-08-28 ENCOUNTER — Other Ambulatory Visit (HOSPITAL_COMMUNITY): Payer: Self-pay | Admitting: Cardiology

## 2023-08-28 MED FILL — Carvedilol Tab 6.25 MG: ORAL | 30 days supply | Qty: 60 | Fill #8 | Status: AC

## 2023-08-29 ENCOUNTER — Other Ambulatory Visit: Payer: Self-pay

## 2023-08-29 ENCOUNTER — Other Ambulatory Visit (HOSPITAL_COMMUNITY): Payer: Self-pay | Admitting: Cardiology

## 2023-08-29 ENCOUNTER — Ambulatory Visit: Admission: RE | Admit: 2023-08-29 | Source: Ambulatory Visit

## 2023-08-30 ENCOUNTER — Other Ambulatory Visit: Payer: Self-pay

## 2023-08-30 MED FILL — Semaglutide Soln Pen-inj 0.25 or 0.5 MG/DOSE (2 MG/3ML): SUBCUTANEOUS | 28 days supply | Qty: 3 | Fill #0 | Status: AC

## 2023-08-31 ENCOUNTER — Other Ambulatory Visit: Payer: Self-pay

## 2023-09-11 ENCOUNTER — Ambulatory Visit
Admission: RE | Admit: 2023-09-11 | Discharge: 2023-09-11 | Disposition: A | Discharge status: Home / Self Care | Source: Ambulatory Visit | Attending: Oncology | Admitting: Oncology

## 2023-09-11 DIAGNOSIS — Z122 Encounter for screening for malignant neoplasm of respiratory organs: Secondary | ICD-10-CM | POA: Diagnosis present

## 2023-09-13 ENCOUNTER — Inpatient Hospital Stay: Attending: Oncology

## 2023-09-13 ENCOUNTER — Inpatient Hospital Stay (HOSPITAL_BASED_OUTPATIENT_CLINIC_OR_DEPARTMENT_OTHER): Admitting: Oncology

## 2023-09-13 ENCOUNTER — Encounter: Payer: Self-pay | Admitting: Oncology

## 2023-09-13 ENCOUNTER — Inpatient Hospital Stay

## 2023-09-13 VITALS — BP 123/71 | HR 75 | Temp 96.3°F | Resp 18 | Wt 291.8 lb

## 2023-09-13 DIAGNOSIS — R7989 Other specified abnormal findings of blood chemistry: Secondary | ICD-10-CM | POA: Diagnosis not present

## 2023-09-13 DIAGNOSIS — Z87891 Personal history of nicotine dependence: Secondary | ICD-10-CM | POA: Diagnosis not present

## 2023-09-13 DIAGNOSIS — D751 Secondary polycythemia: Secondary | ICD-10-CM

## 2023-09-13 DIAGNOSIS — G4733 Obstructive sleep apnea (adult) (pediatric): Secondary | ICD-10-CM | POA: Insufficient documentation

## 2023-09-13 LAB — CBC WITH DIFFERENTIAL (CANCER CENTER ONLY)
Abs Immature Granulocytes: 0.03 K/uL (ref 0.00–0.07)
Basophils Absolute: 0 K/uL (ref 0.0–0.1)
Basophils Relative: 0 %
Eosinophils Absolute: 0.1 K/uL (ref 0.0–0.5)
Eosinophils Relative: 3 %
HCT: 49 % (ref 39.0–52.0)
Hemoglobin: 16.3 g/dL (ref 13.0–17.0)
Immature Granulocytes: 1 %
Lymphocytes Relative: 21 %
Lymphs Abs: 0.9 K/uL (ref 0.7–4.0)
MCH: 29.6 pg (ref 26.0–34.0)
MCHC: 33.3 g/dL (ref 30.0–36.0)
MCV: 89.1 fL (ref 80.0–100.0)
Monocytes Absolute: 0.8 K/uL (ref 0.1–1.0)
Monocytes Relative: 17 %
Neutro Abs: 2.6 K/uL (ref 1.7–7.7)
Neutrophils Relative %: 58 %
Platelet Count: 155 K/uL (ref 150–400)
RBC: 5.5 MIL/uL (ref 4.22–5.81)
RDW: 14.1 % (ref 11.5–15.5)
WBC Count: 4.4 K/uL (ref 4.0–10.5)
nRBC: 0 % (ref 0.0–0.2)

## 2023-09-13 LAB — HEPATITIS PANEL, ACUTE
HCV Ab: NONREACTIVE
Hep A IgM: NONREACTIVE
Hep B C IgM: NONREACTIVE
Hepatitis B Surface Ag: NONREACTIVE

## 2023-09-13 NOTE — Progress Notes (Signed)
 Hematology/Oncology Consult note [phone] [phone]        REFERRING PROVIDER: No ref. 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 Negative BCR-ABL 1 FISH, normal carbo monoxide level, elevated erythropoietin , negative JAK2 V617F mutation with reflex to other mutations, less likely primary primary myeloproliferative disorder  Is going to start using CPAP. hematocrit is less than 55.  I will hold off emergent phlebotomy    Abnormal LFTs Possibly due to fatty liver disease.  Check right upper quadrant ultrasound.  Check hepatitis panel   Former smoker Obtain CT chest without contrast for lung cancer screening purposes.  Result is pending.  Encourage patient to establish care with primary care provider.  A list of local primary care providers offices was provided to patient.  Orders Placed This Encounter  Procedures   CBC (Cancer Center Only)    Standing Status:   Future    Expected Date:   01/14/2024    Expiration Date:   04/13/2024   CMP (Cancer Center only)    Standing Status:   Future    Expected Date:   01/14/2024    Expiration Date:   04/13/2024   Follow-up in 4 months.   All questions were answered. The patient knows to call the clinic with any problems, questions or concerns.  Zelphia Cap, MD, PhD [facility] Hematology Oncology 09/13/2023   HISTORY OF PRESENTING ILLNESS:   Gregory Mckenzie is a  48 y.o.  male with PMH listed below was seen in consultation at the request of  No ref. provider found  for evaluation of elevated hemoglobin  He has consistently elevated hemoglobin and hematocrit levels, with the most recent values on May 17, 2023, showing a hematocrit of 61 and a hemoglobin level of 19.6.  He does not have a primary care doctor and is attempting to establish care with one.  He has a history of  congestive heart failure, diagnosed in 2022, which led him to quit smoking. He is managing his heart condition with medication and is learning to control his fluid intake.   He also has a history of sleep apnea but has not yet been scheduled for a sleep study.  Patient was seen by pulmonology.  He is waiting for a call back to arrange the study. He uses oxygen at night since 2022 but has not been using a CPAP machine.  Patient denies any testosterone use.  INTERVAL HISTORY Gregory Mckenzie is a 48 y.o. male who has above history reviewed by me today presents for follow up visit for Erythrocytosis.   Patient has sleep apnea and is going to start using CPAP machine. Today he reports feeling well.  No new complaints.  MEDICAL HISTORY:  Past Medical History:  Diagnosis Date   Atrial flutter (HCC)    a. CHA2DS2VASc = 4-->xarelto .   CAD (coronary artery disease)    a. 04/2020 Cath: LM nl, LAD 30ost/p, LCX nl, RCA 30p/m.   Chronic combined systolic and diastolic CHF (congestive heart failure) (HCC)    a. 03/2020 Echo: EF 40-45%; b. 05/2021 Echo: EF 40-45%; c. 10/2022 Echo: EF 55-60%, no rwma, sev LVH, RVSP 23.1 mmHg.   Diabetes mellitus without complication (HCC)    Hyperlipidemia    Hypertension    NICM (nonischemic cardiomyopathy) (HCC)    Nonadherence to medication    Obesity    Sleep apnea, obstructive  SURGICAL HISTORY: Past Surgical History:  Procedure Laterality Date   RIGHT HEART CATH N/A 03/31/2023   Procedure: RIGHT HEART CATH;  Surgeon: Cherrie Toribio SAUNDERS, MD;  Location: [facility] INVASIVE CV LAB;  Service: Cardiovascular;  Laterality: N/A;  Heart Failure with mid-range ejection fraction   RIGHT/LEFT HEART CATH AND CORONARY ANGIOGRAPHY N/A 05/08/2020   Procedure: RIGHT/LEFT HEART CATH AND CORONARY ANGIOGRAPHY;  Surgeon: Rolan Ezra RAMAN, MD;  Location: [facility] INVASIVE CV LAB;  Service: Cardiovascular;  Laterality: N/A;    SOCIAL HISTORY: Social History   Socioeconomic History    Marital status: Single    Spouse name: Not on file   Number of children: Not on file   Years of education: Not on file   Highest education level: Not on file  Occupational History   Not on file  Tobacco Use   Smoking status: Former    Current packs/day: 0.00    Average packs/day: 1 pack/day for 26.9 years (26.9 ttl pk-yrs)    Types: Cigarettes    Start date: 04/06/1993    Quit date: 2022    Years since quitting: 3.5   Smokeless tobacco: Never  Vaping Use   Vaping status: Never Used  Substance and Sexual Activity   Alcohol  use: Not Currently   Drug use: Not Currently    Frequency: 7.0 times per week    Types: Marijuana    Comment: daily   Sexual activity: Yes    Partners: Female    Birth control/protection: None  Other Topics Concern   Not on file  Social History Narrative   Not on file   Social Drivers of Health   Financial Resource Strain: Low Risk  (04/09/2020)   Overall Financial Resource Strain (CARDIA)    Difficulty of Paying Living Expenses: Not very hard  Food Insecurity: No Food Insecurity (05/30/2023)   Hunger Vital Sign    Worried About Running Out of Food in the Last Year: Never true    Ran Out of Food in the Last Year: Never true  Transportation Needs: No Transportation Needs (05/30/2023)   PRAPARE - Administrator, Civil Service (Medical): No    Lack of Transportation (Non-Medical): No  Physical Activity: Inactive (04/09/2020)   Exercise Vital Sign    Days of Exercise per Week: 0 days    Minutes of Exercise per Session: 0 min  Stress: Not on file  Social Connections: Not on file  Intimate Partner Violence: Not At Risk (05/30/2023)   Humiliation, Afraid, Rape, and Kick questionnaire    Fear of Current or Ex-Partner: No    Emotionally Abused: No    Physically Abused: No    Sexually Abused: No    FAMILY HISTORY: Family History  Problem Relation Age of Onset   Hypertension Father    Hypertension Brother    Pulmonary embolism Brother     Pancreatic cancer Paternal Uncle     ALLERGIES:  has no known allergies.  MEDICATIONS:  Current Outpatient Medications  Medication Sig Dispense Refill   atorvastatin  (LIPITOR ) 40 MG tablet Take 2 tablets (80 mg total) by mouth at bedtime. 180 tablet 1   carvedilol  (COREG ) 6.25 MG tablet Take 1 tablet (6.25 mg total) by mouth 2 (two) times daily with a meal. 180 tablet 3   empagliflozin  (JARDIANCE ) 10 MG TABS tablet Take 1 tablet (10 mg total) by mouth daily before breakfast. 30 tablet 3   OXYGEN Inhale 4 L into the lungs at bedtime.     rivaroxaban  (  XARELTO ) 20 MG TABS tablet Take 1 tablet (20 mg total) by mouth daily with supper. 90 tablet 3   sacubitril -valsartan  (ENTRESTO ) 49-51 MG Take 1 tablet by mouth 2 (two) times daily. 60 tablet 6   Semaglutide ,0.25 or 0.5MG /DOS, 2 MG/3ML SOPN Inject 0.5 mg into the skin once a week. 3 mL 0   spironolactone  (ALDACTONE ) 25 MG tablet Take 1 tablet (25 mg total) by mouth once daily. 90 tablet 2   torsemide  (DEMADEX ) 20 MG tablet Take 2 tablets (40 mg total) by mouth daily. 60 tablet 11   No current facility-administered medications for this visit.    Review of Systems  Constitutional:  Positive for fatigue. Negative for appetite change, chills, fever and unexpected weight change.  HENT:   Negative for hearing loss and voice change.   Eyes:  Negative for eye problems and icterus.  Respiratory:  Negative for chest tightness, cough and shortness of breath.   Cardiovascular:  Negative for chest pain and leg swelling.  Gastrointestinal:  Negative for abdominal distention and abdominal pain.  Endocrine: Negative for hot flashes.  Genitourinary:  Negative for difficulty urinating, dysuria and frequency.   Musculoskeletal:  Negative for arthralgias.  Skin:  Negative for itching and rash.  Neurological:  Negative for light-headedness and numbness.  Hematological:  Negative for adenopathy. Does not bruise/bleed easily.  Psychiatric/Behavioral:  Negative  for confusion.    PHYSICAL EXAMINATION:  Vitals:   09/13/23 1336  BP: 123/71  Pulse: 75  Resp: 18  Temp: (!) 96.3 F (35.7 C)  SpO2: 93%   Filed Weights   09/13/23 1336  Weight: 291 lb 12.8 oz (132.4 kg)    Physical Exam Constitutional:      General: He is not in acute distress.    Appearance: He is obese.  HENT:     Head: Normocephalic and atraumatic.  Eyes:     General: No scleral icterus. Cardiovascular:     Rate and Rhythm: Normal rate and regular rhythm.     Heart sounds: Normal heart sounds.  Pulmonary:     Effort: Pulmonary effort is normal. No respiratory distress.     Breath sounds: Normal breath sounds. No wheezing.  Abdominal:     General: There is no distension.     Palpations: Abdomen is soft.  Musculoskeletal:        General: No deformity. Normal range of motion.     Cervical back: Normal range of motion and neck supple.  Skin:    General: Skin is warm and dry.     Findings: No erythema or rash.  Neurological:     Mental Status: He is alert and oriented to person, place, and time. Mental status is at baseline.  Psychiatric:        Mood and Affect: Mood normal.     LABORATORY DATA:  I have reviewed the data as listed    Latest Ref Rng & Units 09/13/2023   12:44 PM 06/14/2023   10:40 AM 05/30/2023   11:47 AM  CBC  WBC 4.0 - 10.5 K/uL 4.4  4.8  4.8   Hemoglobin 13.0 - 17.0 g/dL 83.6  82.3  82.3   Hematocrit 39.0 - 52.0 % 49.0  53.2  54.8   Platelets 150 - 400 K/uL 155  173  180       Latest Ref Rng & Units 06/14/2023   10:40 AM 05/30/2023   11:47 AM 05/17/2023   11:51 AM  CMP  Glucose 70 - 99 mg/dL  101   68   BUN 6 - 24 mg/dL 24   17   Creatinine 9.23 - 1.27 mg/dL 8.68   8.65   Sodium 865 - 144 mmol/L 139   142   Potassium 3.5 - 5.2 mmol/L 4.2   3.8   Chloride 96 - 106 mmol/L 99   96   CO2 20 - 29 mmol/L 21   26   Calcium  8.7 - 10.2 mg/dL 9.8   9.9   Total Protein 6.0 - 8.5 g/dL 7.4  7.4    Total Bilirubin 0.0 - 1.2 mg/dL 0.7  1.0     Alkaline Phos 44 - 121 IU/L 242  206    AST 0 - 40 IU/L 41  34    ALT 0 - 44 IU/L 90  80        RADIOGRAPHIC STUDIES: I have personally reviewed the radiological images as listed and agreed with the findings in the report. Sleep Study Documents Result Date: 06/28/2023 Ordered by an unspecified provider.

## 2023-09-13 NOTE — Progress Notes (Signed)
No phlebotomy today.

## 2023-09-13 NOTE — Assessment & Plan Note (Addendum)
 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 Negative BCR-ABL 1 FISH, normal carbo monoxide level, elevated erythropoietin , negative JAK2 V617F mutation with reflex to other mutations, less likely primary primary myeloproliferative disorder  Is going to start using CPAP. hematocrit is less than 55.  I will hold off emergent phlebotomy

## 2023-09-13 NOTE — Assessment & Plan Note (Signed)
 Obtain CT chest without contrast for lung cancer screening purposes.  Result is pending.

## 2023-09-13 NOTE — Assessment & Plan Note (Signed)
 Possibly due to fatty liver disease.  Check right upper quadrant ultrasound.  Check hepatitis panel

## 2023-09-18 ENCOUNTER — Telehealth: Payer: Self-pay | Admitting: *Deleted

## 2023-09-18 NOTE — Telephone Encounter (Signed)
 [facility] radiology called to confirmed we had report.     LINICAL DATA:  48 year old male former smoker with 27 pack-year smoking history, quit smoking 2011   EXAM: CT CHEST WITHOUT CONTRAST LOW-DOSE FOR LUNG CANCER SCREENING   TECHNIQUE: Multidetector CT imaging of the chest was performed following the standard protocol without IV contrast.   RADIATION DOSE REDUCTION: This exam was performed according to the departmental dose-optimization program which includes automated exposure control, adjustment of the mA and/or kV according to patient size and/or use of iterative reconstruction technique.   COMPARISON:  03/29/2023 chest radiograph.   FINDINGS: Cardiovascular: Top-normal heart size. No significant pericardial effusion/thickening. Atherosclerotic nonaneurysmal thoracic aorta. Normal caliber pulmonary arteries.   Mediastinum/Nodes: No significant thyroid nodules. Unremarkable esophagus. No axillary adenopathy. Mildly enlarged 1.0 cm right paratracheal node on series 2/image 22 and mildly enlarged partially coarsely calcified 1.4 cm right subcarinal node on image 31. Calcified nonenlarged left hilar node. Otherwise no discrete hilar adenopathy on these noncontrast images.   Lungs/Pleura: No pneumothorax. No pleural effusion. Mild centrilobular emphysema with diffuse bronchial wall thickening. No acute consolidative airspace disease or lung masses. Several pulmonary nodules scattered in both lungs, largest in indistinct posterior peripheral basilar right middle lobe irregular nodule measuring 26.8 mm in volume derived mean diameter on image 201. Calcified 4 mm left upper lobe granuloma.   Upper abdomen: No acute abnormality.   Musculoskeletal: No aggressive appearing focal osseous lesions. Mild thoracic spondylosis.   IMPRESSION: 1. Lung-RADS 4B, suspicious. Additional imaging evaluation or consultation with Pulmonology or Thoracic Surgery recommended. Several  pulmonary nodules scattered in both lungs, largest in the posterior peripheral basilar right middle lobe measuring 26.8 mm in volume derived mean diameter, potentially infectious/inflammatory. Follow up low-dose chest CT without contrast in 3 months (please use the following order, CT CHEST LCS NODULE FOLLOW-UP W/O CM) is recommended. 2. Mild mediastinal lymphadenopathy, minimally calcified, nonspecific, potentially granulomatous. 3. Aortic Atherosclerosis (ICD10-I70.0) and Emphysema (ICD10-J43.9).   These results will be called to the ordering clinician or representative by the Radiologist Assistant, and communication documented in the PACS or [employer].     Electronically Signed   By: Selinda DELENA Blue M.D.   On: 09/18/2023 08:35

## 2023-09-19 ENCOUNTER — Ambulatory Visit
Admission: RE | Admit: 2023-09-19 | Discharge: 2023-09-19 | Disposition: A | Discharge status: Home / Self Care | Source: Ambulatory Visit | Attending: Oncology | Admitting: Oncology

## 2023-09-19 ENCOUNTER — Telehealth: Payer: Self-pay

## 2023-09-19 ENCOUNTER — Other Ambulatory Visit: Payer: Self-pay

## 2023-09-19 DIAGNOSIS — R7989 Other specified abnormal findings of blood chemistry: Secondary | ICD-10-CM | POA: Diagnosis present

## 2023-09-19 DIAGNOSIS — R918 Other nonspecific abnormal finding of lung field: Secondary | ICD-10-CM

## 2023-09-19 NOTE — Telephone Encounter (Signed)
 Called patient results of CT scan. Per Dr. Babara she would like a PET scan and referral to Dr. Shelah at [facility] pulmonology for broch evaluation. PET ordered and referral placed. All questions answered. Message sent to scheduling to arrange PET.

## 2023-09-25 ENCOUNTER — Other Ambulatory Visit: Payer: Self-pay | Admitting: Family

## 2023-09-25 ENCOUNTER — Other Ambulatory Visit: Payer: Self-pay

## 2023-09-26 ENCOUNTER — Other Ambulatory Visit: Payer: Self-pay

## 2023-09-27 ENCOUNTER — Other Ambulatory Visit: Payer: Self-pay

## 2023-09-27 MED FILL — Carvedilol Tab 6.25 MG: ORAL | 90 days supply | Qty: 180 | Fill #0 | Status: AC

## 2023-09-28 ENCOUNTER — Other Ambulatory Visit: Payer: Self-pay

## 2023-09-29 ENCOUNTER — Ambulatory Visit
Admission: RE | Admit: 2023-09-29 | Discharge: 2023-09-29 | Disposition: A | Discharge status: Home / Self Care | Source: Ambulatory Visit | Attending: Oncology | Admitting: Oncology

## 2023-09-29 DIAGNOSIS — I517 Cardiomegaly: Secondary | ICD-10-CM | POA: Diagnosis not present

## 2023-09-29 DIAGNOSIS — J321 Chronic frontal sinusitis: Secondary | ICD-10-CM | POA: Diagnosis not present

## 2023-09-29 DIAGNOSIS — R918 Other nonspecific abnormal finding of lung field: Secondary | ICD-10-CM | POA: Diagnosis present

## 2023-09-29 DIAGNOSIS — I700 Atherosclerosis of aorta: Secondary | ICD-10-CM | POA: Diagnosis not present

## 2023-09-29 LAB — GLUCOSE, CAPILLARY: Glucose-Capillary: 95 mg/dL (ref 70–99)

## 2023-09-29 MED ORDER — FLUDEOXYGLUCOSE F - 18 (FDG) INJECTION
12.0400 | Freq: Once | INTRAVENOUS | Status: AC | PRN
  Administered 2023-09-29: 12.04 via INTRAVENOUS

## 2023-10-08 ENCOUNTER — Other Ambulatory Visit: Payer: Self-pay | Admitting: Family

## 2023-10-09 ENCOUNTER — Other Ambulatory Visit: Payer: Self-pay

## 2023-10-10 ENCOUNTER — Other Ambulatory Visit: Payer: Self-pay

## 2023-10-11 ENCOUNTER — Other Ambulatory Visit: Payer: Self-pay | Admitting: Family

## 2023-10-11 ENCOUNTER — Other Ambulatory Visit: Payer: Self-pay

## 2023-10-11 MED ORDER — ATORVASTATIN CALCIUM 40 MG PO TABS
80.0000 mg | ORAL_TABLET | Freq: Every day | ORAL | 3 refills | Status: AC
  Filled 2023-10-11: qty 180, 90d supply, fill #0
  Filled 2024-01-15: qty 180, 90d supply, fill #1
  Filled 2024-02-20 (×2): qty 180, 90d supply, fill #2

## 2023-10-12 ENCOUNTER — Ambulatory Visit (INDEPENDENT_AMBULATORY_CARE_PROVIDER_SITE_OTHER): Admitting: Pulmonary Disease

## 2023-10-12 VITALS — BP 109/69 | HR 74 | Ht 66.0 in | Wt 295.0 lb

## 2023-10-12 DIAGNOSIS — R0902 Hypoxemia: Secondary | ICD-10-CM

## 2023-10-12 DIAGNOSIS — J9601 Acute respiratory failure with hypoxia: Secondary | ICD-10-CM | POA: Diagnosis not present

## 2023-10-12 DIAGNOSIS — G4739 Other sleep apnea: Secondary | ICD-10-CM

## 2023-10-12 DIAGNOSIS — J9602 Acute respiratory failure with hypercapnia: Secondary | ICD-10-CM

## 2023-10-12 NOTE — Progress Notes (Signed)
 Gregory Mckenzie    [id number]    09/06/1975  Primary Care Physician:Pcp, No  Referring Physician: No referring provider defined for this encounter.  Chief complaint:   Follow-up from recent hospitalization Being evaluated for sleep disordered breathing  HPI:  Was recently hospitalized for decompensated biventricular heart failure  Has been doing well since the last visit  Had a sleep study and was advised about the findings Yet to start CPAP therapy  History of obstructive sleep apnea, obesity hypoventilation Did use BiPAP previously  He is short of breath with most activities, he feels a little bit better since his recent hospitalization  Usually goes to bed about 930 wake up time about 630  Sleep habits have not changed much  Mother does have obstructive sleep apnea, also has a brother with obstructive sleep apnea  His previous machine was donated when he left the hospital in April 2023  Wakes up multiple times at night to use the restroom  He does have type 2 diabetes, chronic kidney disease, paroxysmal atrial flutter, combined systolic and diastolic dysfunction  Reformed smoker, quit in 2023    Outpatient Encounter Medications as of 10/12/2023  Medication Sig   atorvastatin  (LIPITOR ) 40 MG tablet Take 2 tablets (80 mg total) by mouth at bedtime.   carvedilol  (COREG ) 6.25 MG tablet Take 1 tablet (6.25 mg total) by mouth 2 (two) times daily with a meal.   empagliflozin  (JARDIANCE ) 10 MG TABS tablet Take 1 tablet (10 mg total) by mouth daily before breakfast.   OXYGEN Inhale 4 L into the lungs at bedtime.   rivaroxaban  (XARELTO ) 20 MG TABS tablet Take 1 tablet (20 mg total) by mouth daily with supper.   sacubitril -valsartan  (ENTRESTO ) 49-51 MG Take 1 tablet by mouth 2 (two) times daily.   Semaglutide ,0.25 or 0.5MG /DOS, 2 MG/3ML SOPN Inject 0.5 mg into the skin once a week.   spironolactone  (ALDACTONE ) 25 MG tablet Take 1 tablet (25 mg total) by  mouth once daily.   torsemide  (DEMADEX ) 20 MG tablet Take 2 tablets (40 mg total) by mouth daily.   No facility-administered encounter medications on file as of 10/12/2023.    Allergies as of 10/12/2023   (No Known Allergies)    Past Medical History:  Diagnosis Date   Atrial flutter (HCC)    a. CHA2DS2VASc = 4-->xarelto .   CAD (coronary artery disease)    a. 04/2020 Cath: LM nl, LAD 30ost/p, LCX nl, RCA 30p/m.   Chronic combined systolic and diastolic CHF (congestive heart failure) (HCC)    a. 03/2020 Echo: EF 40-45%; b. 05/2021 Echo: EF 40-45%; c. 10/2022 Echo: EF 55-60%, no rwma, sev LVH, RVSP 23.1 mmHg.   Diabetes mellitus without complication (HCC)    Hyperlipidemia    Hypertension    NICM (nonischemic cardiomyopathy) (HCC)    Nonadherence to medication    Obesity    Sleep apnea, obstructive     Past Surgical History:  Procedure Laterality Date   RIGHT HEART CATH N/A 03/31/2023   Procedure: RIGHT HEART CATH;  Surgeon: Cherrie Toribio SAUNDERS, MD;  Location: [facility] INVASIVE CV LAB;  Service: Cardiovascular;  Laterality: N/A;  Heart Failure with mid-range ejection fraction   RIGHT/LEFT HEART CATH AND CORONARY ANGIOGRAPHY N/A 05/08/2020   Procedure: RIGHT/LEFT HEART CATH AND CORONARY ANGIOGRAPHY;  Surgeon: Rolan Ezra RAMAN, MD;  Location: [facility] INVASIVE CV LAB;  Service: Cardiovascular;  Laterality: N/A;    Family History  Problem Relation Age of Onset  Hypertension Father    Hypertension Brother    Pulmonary embolism Brother    Pancreatic cancer Paternal Uncle     Social History   Socioeconomic History   Marital status: Single    Spouse name: Not on file   Number of children: Not on file   Years of education: Not on file   Highest education level: Not on file  Occupational History   Not on file  Tobacco Use   Smoking status: Former    Current packs/day: 0.00    Average packs/day: 1 pack/day for 26.9 years (26.9 ttl pk-yrs)    Types: Cigarettes    Start date: 04/06/1993     Quit date: 2022    Years since quitting: 3.6   Smokeless tobacco: Never  Vaping Use   Vaping status: Never Used  Substance and Sexual Activity   Alcohol  use: Not Currently   Drug use: Not Currently    Frequency: 7.0 times per week    Types: Marijuana    Comment: daily   Sexual activity: Yes    Partners: Female    Birth control/protection: None  Other Topics Concern   Not on file  Social History Narrative   Not on file   Social Drivers of Health   Financial Resource Strain: Low Risk  (04/09/2020)   Overall Financial Resource Strain (CARDIA)    Difficulty of Paying Living Expenses: Not very hard  Food Insecurity: No Food Insecurity (05/30/2023)   Hunger Vital Sign    Worried About Running Out of Food in the Last Year: Never true    Ran Out of Food in the Last Year: Never true  Transportation Needs: No Transportation Needs (05/30/2023)   PRAPARE - Administrator, Civil Service (Medical): No    Lack of Transportation (Non-Medical): No  Physical Activity: Inactive (04/09/2020)   Exercise Vital Sign    Days of Exercise per Week: 0 days    Minutes of Exercise per Session: 0 min  Stress: Not on file  Social Connections: Not on file  Intimate Partner Violence: Not At Risk (05/30/2023)   Humiliation, Afraid, Rape, and Kick questionnaire    Fear of Current or Ex-Partner: No    Emotionally Abused: No    Physically Abused: No    Sexually Abused: No    Review of Systems  Constitutional:  Positive for fatigue.  Respiratory:  Positive for apnea and shortness of breath.   Psychiatric/Behavioral:  Positive for sleep disturbance.     Vitals:   10/12/23 0843  BP: 109/69  Pulse: 74  SpO2: 98%     Physical Exam Constitutional:      Appearance: He is obese.  HENT:     Head: Normocephalic.     Mouth/Throat:     Mouth: Mucous membranes are moist.  Eyes:     General: No scleral icterus. Cardiovascular:     Rate and Rhythm: Normal rate and regular rhythm.     Heart  sounds: No murmur heard.    No friction rub.  Pulmonary:     Effort: No respiratory distress.     Breath sounds: No stridor. No wheezing or rhonchi.  Musculoskeletal:     Cervical back: No rigidity or tenderness.  Neurological:     Mental Status: He is alert.  Psychiatric:        Mood and Affect: Mood normal.    Data Reviewed: Hospitalization records from February 2025 reviewed -Treated for CHF exacerbation, non-ST elevation MI, acute on chronic  kidney disease  Echocardiogram 10/20/2022 reviewed showing normal ejection fraction, severe left ventricular hypertrophy, indeterminate left ventricular diastolic parameters, no evidence of valvulopathy  Last chest x-ray on record April 2023-hypoventilated lung fields with atelectasis at the bases  Right heart catheterization 03/31/2023 shows PA pressures of 51/27 with a mean of 36, wedge pressure of 19 PVR of 3, 4 Wood units-moderate pulmonary hypertension, increased left-sided pressures  Sleep study reviewed and discussed with patient during the visit today with severe obstructive sleep apnea, successful titration to CPAP  Assessment:   History of respiratory failure - Continues to feel better  Biventricular heart failure Moderate pulmonary hypertension   Severe obstructive sleep apnea Titrated to CPAP - Yet to start CPAP therapy   History of atrial flutter  Obesity hypoventilation  Type 2 diabetes    Plan/Recommendations:  CPAP pressure of 16 EPR of 2 with heated humidification, large ResMed AirFit F40 fullface mask was used during a titration  Encourage graded activity as tolerated  Encouraged ongoing compliance with medications  Continue weight loss efforts  Encouraged to call with significant concerns   Jennet Epley MD [facility] 10/12/2023, 9:09 AM  CC: No ref. provider found

## 2023-10-12 NOTE — Patient Instructions (Addendum)
 Your sleep study did reveal severe obstructive sleep apnea, you responded well to the mask and pressure therapy  Need to get set up with a machine as soon as possible  I will see you within 1 to 3 months of your set up with your sleep machine  Your medical supply company is [employer]  Continue weight loss efforts  Call us  with significant concerns

## 2023-10-15 ENCOUNTER — Ambulatory Visit: Payer: Self-pay | Admitting: Oncology

## 2023-10-23 ENCOUNTER — Encounter: Payer: Self-pay | Admitting: *Deleted

## 2023-10-23 NOTE — Progress Notes (Signed)
 Spoke to patient to review abnormal PET scan results and MD recommendations to follow up with pulmonary. Pt made aware that is scheduled to see pulmonary on 9/24 at 11:30am to further discuss his lung nodules and if he will require additional work up. Informed pt that will follow up with him by phone after his pulmonary visit to discuss further follow up with Dr. Babara. Contact info given and instructed to call with any questions or needs. Pt verbalized understanding.

## 2023-10-24 NOTE — Addendum Note (Signed)
 Encounter addended by: Gladis Burnard SAILOR on: 10/24/2023 2:04 PM  Actions taken: Imaging Exam begun, Charge Capture section accepted

## 2023-10-29 ENCOUNTER — Other Ambulatory Visit (HOSPITAL_COMMUNITY): Payer: Self-pay | Admitting: Cardiology

## 2023-10-30 ENCOUNTER — Other Ambulatory Visit: Payer: Self-pay

## 2023-10-30 MED ORDER — OZEMPIC (0.25 OR 0.5 MG/DOSE) 2 MG/3ML ~~LOC~~ SOPN
0.5000 mg | PEN_INJECTOR | SUBCUTANEOUS | 0 refills | Status: DC
  Filled 2023-10-30: qty 3, 28d supply, fill #0

## 2023-10-30 MED FILL — Spironolactone Tab 25 MG: ORAL | 90 days supply | Qty: 90 | Fill #2 | Status: AC

## 2023-11-03 ENCOUNTER — Other Ambulatory Visit (HOSPITAL_COMMUNITY): Payer: Self-pay

## 2023-11-03 ENCOUNTER — Encounter: Payer: Self-pay | Admitting: Cardiology

## 2023-11-03 ENCOUNTER — Other Ambulatory Visit: Payer: Self-pay

## 2023-11-03 ENCOUNTER — Ambulatory Visit: Attending: Cardiology | Admitting: Cardiology

## 2023-11-03 VITALS — BP 126/85 | HR 89 | Wt 295.0 lb

## 2023-11-03 DIAGNOSIS — I420 Dilated cardiomyopathy: Secondary | ICD-10-CM | POA: Insufficient documentation

## 2023-11-03 DIAGNOSIS — I130 Hypertensive heart and chronic kidney disease with heart failure and stage 1 through stage 4 chronic kidney disease, or unspecified chronic kidney disease: Secondary | ICD-10-CM | POA: Diagnosis present

## 2023-11-03 DIAGNOSIS — R918 Other nonspecific abnormal finding of lung field: Secondary | ICD-10-CM | POA: Insufficient documentation

## 2023-11-03 DIAGNOSIS — D751 Secondary polycythemia: Secondary | ICD-10-CM | POA: Diagnosis not present

## 2023-11-03 DIAGNOSIS — I480 Paroxysmal atrial fibrillation: Secondary | ICD-10-CM | POA: Diagnosis not present

## 2023-11-03 DIAGNOSIS — I2510 Atherosclerotic heart disease of native coronary artery without angina pectoris: Secondary | ICD-10-CM | POA: Insufficient documentation

## 2023-11-03 DIAGNOSIS — R7989 Other specified abnormal findings of blood chemistry: Secondary | ICD-10-CM | POA: Diagnosis not present

## 2023-11-03 DIAGNOSIS — I272 Pulmonary hypertension, unspecified: Secondary | ICD-10-CM | POA: Insufficient documentation

## 2023-11-03 DIAGNOSIS — E1122 Type 2 diabetes mellitus with diabetic chronic kidney disease: Secondary | ICD-10-CM | POA: Insufficient documentation

## 2023-11-03 DIAGNOSIS — N183 Chronic kidney disease, stage 3 unspecified: Secondary | ICD-10-CM | POA: Diagnosis not present

## 2023-11-03 DIAGNOSIS — Z7984 Long term (current) use of oral hypoglycemic drugs: Secondary | ICD-10-CM | POA: Insufficient documentation

## 2023-11-03 DIAGNOSIS — Z79899 Other long term (current) drug therapy: Secondary | ICD-10-CM | POA: Diagnosis not present

## 2023-11-03 DIAGNOSIS — E662 Morbid (severe) obesity with alveolar hypoventilation: Secondary | ICD-10-CM | POA: Insufficient documentation

## 2023-11-03 DIAGNOSIS — I5042 Chronic combined systolic (congestive) and diastolic (congestive) heart failure: Secondary | ICD-10-CM | POA: Insufficient documentation

## 2023-11-03 DIAGNOSIS — J9611 Chronic respiratory failure with hypoxia: Secondary | ICD-10-CM | POA: Insufficient documentation

## 2023-11-03 MED ORDER — SACUBITRIL-VALSARTAN 97-103 MG PO TABS
1.0000 | ORAL_TABLET | Freq: Two times a day (BID) | ORAL | 6 refills | Status: AC
  Filled 2023-11-03 – 2023-11-07 (×2): qty 60, 30d supply, fill #0
  Filled 2023-12-06: qty 60, 30d supply, fill #1
  Filled 2024-01-15: qty 60, 30d supply, fill #2
  Filled 2024-02-15: qty 60, 30d supply, fill #3
  Filled 2024-03-20: qty 60, 30d supply, fill #4

## 2023-11-03 MED ORDER — SEMAGLUTIDE (1 MG/DOSE) 4 MG/3ML ~~LOC~~ SOPN
1.0000 mg | PEN_INJECTOR | SUBCUTANEOUS | 0 refills | Status: DC
Start: 2023-11-03 — End: 2023-11-24
  Filled 2023-11-03 – 2023-11-07 (×2): qty 3, 28d supply, fill #0

## 2023-11-03 NOTE — Patient Instructions (Signed)
 Medication Changes:  INCREASE Ozempic  dose, new prescription has been sent in  INCREASE Entresto  to 97/103 mg Twice daily   Lab Work:  Labs are needed today, **PLEASE go downstairs to [place] on [address] to have your blood work completed.  Your physician recommends that you return for lab work in: 2 weeks, please return to [facility] on [address] for this, no appointment is needed Mon-Fri 8-12:30 or 2-4:30  Labs done today, your results will be available in MyChart, we will contact you for abnormal readings. We will only call you if the results are abnormal or if the provider would like to make medication changes. No news is good news.   Genetic testing has been collected, this has to be sent to [place]  for processing and can take 1-2 weeks for us  to get results back.  We will let you know the results once reviewed by your provider.  Testing/Procedures:  Your physician has requested that you have a cardiac MRI. Cardiac MRI uses a computer to create images of your heart as its beating, producing both still and moving pictures of your heart and major blood vessels. For further information please visit [email or web address]. Please follow the instruction sheet below. YOU WILL BE CALLED TO SCHEDULE THIS  Special Instructions // Education:  Do the following things EVERYDAY: Weigh yourself in the morning before breakfast. Write it down and keep it in a log. Take your medicines as prescribed Eat low salt foods--Limit salt (sodium) to 2000 mg per day.  Stay as active as you can everyday Limit all fluids for the day to less than 2 liters    You are scheduled for Cardiac MRI at the location below.  Please arrive for your appointment at _YOU WILL BE CALLED TO SCHEDULE_ . ?  [facility] [address] [place], [place] [address] Please go to the [facility] and check-in with the desk attendant.   Magnetic resonance imaging (MRI) is a painless test that  produces images of the inside of the body without using Xrays.  During an MRI, strong magnets and radio waves work together in a Data processing manager to form detailed images.   MRI images may provide more details about a medical condition than X-rays, CT scans, and ultrasounds can provide.  You may be given earphones to listen for instructions.  You may eat a light breakfast and take medications as ordered with the exception of furosemide , hydrochlorothiazide, chlorthalidone or spironolactone  (or any other fluid pill). If you are undergoing a stress MRI, please avoid stimulants for 12 hr prior to test. (I.e. Caffeine, nicotine , chocolate, or antihistamine medications)  If your provider has ordered anti-anxiety medications for this test, then you will need a driver.  An IV will be inserted into one of your veins. Contrast material will be injected into your IV. It will leave your body through your urine within a day. You may be told to drink plenty of fluids to help flush the contrast material out of your system.  You will be asked to remove all metal, including: Watch, jewelry, and other metal objects including hearing aids, hair pieces and dentures. Also wearable glucose monitoring systems (ie. Freestyle Libre and Omnipods) (Braces and fillings normally are not a problem.)   TEST WILL TAKE APPROXIMATELY 1 HOUR  PLEASE NOTIFY SCHEDULING AT LEAST 24 HOURS IN ADVANCE IF YOU ARE UNABLE TO KEEP YOUR APPOINTMENT. [phone]  For more information and frequently asked questions, please visit our website : [email or web address]  Please call the Cardiac Imaging Nurse Navigators with any questions/concerns. [phone] Office    Follow-Up in: 2-3 MONTHS    If you have any questions or concerns before your next appointment please send us  a message through [place] or call our office at [phone], If it is after office hours your call will be answered by our answering service and  directed appropriately.     At the [facility], you and your health needs are our priority. We have a designated team specialized in the treatment of Heart Failure. This Care Team includes your primary Heart Failure Specialized Cardiologist (physician), Advanced Practice Providers (APPs- Physician Assistants and Nurse Practitioners), and Pharmacist who all work together to provide you with the care you need, when you need it.   You may see any of the following providers on your designated Care Team at your next follow up:  Dr. Toribio Fuel Dr. Ezra Shuck Dr. Ria Commander Dr. Odis Brownie Greig Mosses, NP [place]affie Shed, [place] [address] [place], [place] Beckey Coe, NP [place] Lee, NP Ellouise Class, NP Jaun Bash, PharmD

## 2023-11-03 NOTE — Progress Notes (Signed)
 cardiomyopathy genetic testing collected via buccal per Dr rolan.  Order form completed, signed and shipped with sample by Fed[employer] to [facility].

## 2023-11-05 ENCOUNTER — Ambulatory Visit (HOSPITAL_COMMUNITY): Payer: Self-pay | Admitting: Cardiology

## 2023-11-05 LAB — COMPREHENSIVE METABOLIC PANEL WITH GFR
ALT: 49 IU/L — ABNORMAL HIGH (ref 0–44)
AST: 27 IU/L (ref 0–40)
Albumin: 4.7 g/dL (ref 4.1–5.1)
Alkaline Phosphatase: 160 IU/L — ABNORMAL HIGH (ref 47–123)
BUN/Creatinine Ratio: 14 (ref 9–20)
BUN: 21 mg/dL (ref 6–24)
Bilirubin Total: 0.5 mg/dL (ref 0.0–1.2)
CO2: 24 mmol/L (ref 20–29)
Calcium: 10.3 mg/dL — ABNORMAL HIGH (ref 8.7–10.2)
Chloride: 97 mmol/L (ref 96–106)
Creatinine, Ser: 1.54 mg/dL — ABNORMAL HIGH (ref 0.76–1.27)
Globulin, Total: 3 g/dL (ref 1.5–4.5)
Glucose: 122 mg/dL — ABNORMAL HIGH (ref 70–99)
Potassium: 3.6 mmol/L (ref 3.5–5.2)
Sodium: 141 mmol/L (ref 134–144)
Total Protein: 7.7 g/dL (ref 6.0–8.5)
eGFR: 55 mL/min/1.73 — ABNORMAL LOW (ref >59)

## 2023-11-05 LAB — CBC
Hematocrit: 53.8 % — ABNORMAL HIGH (ref 37.5–51.0)
Hemoglobin: 17.3 g/dL (ref 13.0–17.7)
MCH: 29 pg (ref 26.6–33.0)
MCHC: 32.2 g/dL (ref 31.5–35.7)
MCV: 90 fL (ref 79–97)
Platelets: 186 x10E3/uL (ref 150–450)
RBC: 5.96 x10E6/uL — ABNORMAL HIGH (ref 4.14–5.80)
RDW: 13.8 % (ref 11.6–15.4)
WBC: 5.5 x10E3/uL (ref 3.4–10.8)

## 2023-11-05 LAB — BRAIN NATRIURETIC PEPTIDE: BNP: 11.1 pg/mL (ref 0.0–100.0)

## 2023-11-05 NOTE — Progress Notes (Signed)
 PCP: Pcp, No HF Cardiology: Dr. Rolan  Chief complaint: CHF  48 y.o. with history of CKD stage 3, type 2 diabetes, HTN, paroxysmal atrial fibrillation, OHS/OSA, and chronic HF with mid range EF and prominent RV dysfunction returns for followup of CHF. Patient had LHC in 2022 with mild nonobstructive CAD.  Patient was admitted in 2/25 with volume overload and hypoxemic respiratory failure.  He was intubated and diuresed.    Weight is up 23 lbs, he says that he has been stress eating due to concerns about pulmonary nodules.  To see pulmonary about this and will likely need biopsy.  He is using CPAP regularly.  He is short of breath with long walks.  No dyspnea with ADLs, no chest pain, no lightheadedness.  No orthopnea/PND.  ECG (personally reviewed): NSR, LAFB, poor RWP  Labs (3/25): K 3.9, creatinine 1.18 Labs (4/25): hgb 17.6, AST 34, ALT 80, TGs 206, LDL 81, BNP 87, K 3.8, creatinine 1.39, AST 41, ALT 90  PMH: 1. CKD stage 3 2. Type 2 diabetes 3. HTN 4. Atrial fibrillation: Paroxysmal 5. OHS/OSA: Chronic hypoxemic respiratory failure on home oxygen (now using only at night). Trying to get CPAP.  6. HF with mid range EF:  - Echo in 2/23: EF 40-45%.  - Echo (2/25): EF 45-50%, severe LVH, moderate RV enlargement with moderate RV dysfunction.  - LHC (5/22): Mild nonobstructive CAD.  - RHC (2/25): mean RA 8, PA 51/27, mean PCWP 19, CI 2.3 F/2.5 T.  PVR 2.4 WU, PAPi 3.  7. Hyperlipidemia 8. Polycythemia: Likely due to chronic hypoxemia - JAK2 mutation negative.  9. Elevated LFTs: Abdominal US  unremarkable in 8/25.   FH: 2 uncles with CHF, Cousin died of CHF at 88, 3 other cousins with CHF.  No known sarcoidosis.   Social History   Socioeconomic History   Marital status: Single    Spouse name: Not on file   Number of children: Not on file   Years of education: Not on file   Highest education level: Not on file  Occupational History   Not on file  Tobacco Use   Smoking status:  Former    Current packs/day: 0.00    Average packs/day: 1 pack/day for 26.9 years (26.9 ttl pk-yrs)    Types: Cigarettes    Start date: 04/06/1993    Quit date: 2022    Years since quitting: 3.7   Smokeless tobacco: Never  Vaping Use   Vaping status: Never Used  Substance and Sexual Activity   Alcohol  use: Not Currently   Drug use: Not Currently    Frequency: 7.0 times per week    Types: Marijuana    Comment: daily   Sexual activity: Yes    Partners: Female    Birth control/protection: None  Other Topics Concern   Not on file  Social History Narrative   Not on file   Social Drivers of Health   Financial Resource Strain: Low Risk  (04/09/2020)   Overall Financial Resource Strain (CARDIA)    Difficulty of Paying Living Expenses: Not very hard  Food Insecurity: No Food Insecurity (05/30/2023)   Hunger Vital Sign    Worried About Running Out of Food in the Last Year: Never true    Ran Out of Food in the Last Year: Never true  Transportation Needs: No Transportation Needs (05/30/2023)   PRAPARE - Administrator, Civil Service (Medical): No    Lack of Transportation (Non-Medical): No  Physical Activity: Inactive (  04/09/2020)   Exercise Vital Sign    Days of Exercise per Week: 0 days    Minutes of Exercise per Session: 0 min  Stress: Not on file  Social Connections: Not on file  Intimate Partner Violence: Not At Risk (05/30/2023)   Humiliation, Afraid, Rape, and Kick questionnaire    Fear of Current or Ex-Partner: No    Emotionally Abused: No    Physically Abused: No    Sexually Abused: No   ROS: All systems reviewed and negative except as per HPI.   Current Outpatient Medications  Medication Sig Dispense Refill   atorvastatin  (LIPITOR ) 40 MG tablet Take 2 tablets (80 mg total) by mouth at bedtime. 180 tablet 3   carvedilol  (COREG ) 6.25 MG tablet Take 1 tablet (6.25 mg total) by mouth 2 (two) times daily with a meal. 180 tablet 3   empagliflozin  (JARDIANCE ) 10 MG  TABS tablet Take 1 tablet (10 mg total) by mouth daily before breakfast. 30 tablet 3   OXYGEN Inhale 4 L into the lungs at bedtime.     rivaroxaban  (XARELTO ) 20 MG TABS tablet Take 1 tablet (20 mg total) by mouth daily with supper. 90 tablet 3   sacubitril -valsartan  (ENTRESTO ) 97-103 MG Take 1 tablet by mouth 2 (two) times daily. 60 tablet 6   Semaglutide , 1 MG/DOSE, 4 MG/3ML SOPN Inject 1 mg into the skin once a week. 3 mL 0   spironolactone  (ALDACTONE ) 25 MG tablet Take 1 tablet (25 mg total) by mouth once daily. 90 tablet 2   torsemide  (DEMADEX ) 20 MG tablet Take 2 tablets (40 mg total) by mouth daily. 60 tablet 11   No current facility-administered medications for this visit.   BP 126/85   Pulse 89   Wt 295 lb (133.8 kg)   SpO2 93%   BMI 47.61 kg/m  General: NAD, obese.  Neck: No JVD, no thyromegaly or thyroid nodule.  Lungs: Clear to auscultation bilaterally with normal respiratory effort. CV: Nondisplaced PMI.  Heart regular S1/S2, no S3/S4, no murmur.  No peripheral edema.  No carotid bruit.  Normal pedal pulses.  Abdomen: Soft, nontender, no hepatosplenomegaly, no distention.  Skin: Intact without lesions or rashes.  Neurologic: Alert and oriented x 3.  Psych: Normal affect. Extremities: No clubbing or cyanosis.  HEENT: Normal.   Assessment/Plan:  1. Chronic HF with mid-range EF with prominent RV dysfunction: Significant RV dysfunction in the setting of OHS/OSA. Echo in 2/25 showed EF 45-50%, severe LVH, moderate RV enlargement and moderate RV dysfunction, PASP 44 mmHg. Cath in 5/22 showed mild nonobstructive coronary disease.  Severe LVH with mildly decreased EF is and without marked hypertension concerns me for hypertrophic cardiomyopathy or Fabry's.  He also has a strong family history of CHF/cardiomyopathy.  Finally, he has lung nodules that were PET-positive so sarcoidosis would certainly be a consideration as well. NYHA class II, he does not look volume overloaded.  - I am  going to obtain a cardiac MRI to look for evidence of HCM, Fabry's, cardiac sarcoidosis.  - I will send [facility] testing for common familial cardiomyopathies.  - Continue torsemide  40 mg daily.  BMET/BNP today.  - Continue spironolactone  25 mg daily.  - Continue Jardiance  10 mg daily.  - Continue Coreg  6.25 mg bid.  No BP room to increase.  - Increase Entresto  to 97/103 bid. BMET 10 days.  2. Pulmonary hypertension: RHC 2/25 showed moderate mixed pulmonary venous/pulmonary arterial hypertension, group 2 and 3 PH in setting of CHF  and OHS/OSA.   - Not candidate for pulmonary vasodilators.  3. OHS/OSA: Suspect this contributes to RV failure. He now using CPAP.   4. CKD stage 3: BMET today.  5. Polycythemia: Likely due to chronic hypoxemia. Saw hematology, JAK2 mutation negative.  - Using CPAP with oxygen.  6. Atrial fibrillation: Paroxysmal.  No palpitations, NSR.    - Continue Xarelto .  7. Obesity: He needs to titrate up semaglutide .  If weight does not start to come down, will transition to Mounjaro.  8. Lung nodules: To see pulmonary, will need biopsy.  Prior smoker (quit 2022).  Also consider possibility of sarcoidosis.  9. Elevated LFTs: Normal abdominal US .  - Check LFTs today.    Followup 2 months with me.   I spent 31 minutes reviewing records, interviewing/examining patient, and managing orders.   Ezra Shuck 11/05/2023

## 2023-11-07 ENCOUNTER — Other Ambulatory Visit: Payer: Self-pay

## 2023-11-07 MED FILL — Rivaroxaban Tab 20 MG: ORAL | 90 days supply | Qty: 90 | Fill #1 | Status: AC

## 2023-11-08 ENCOUNTER — Ambulatory Visit

## 2023-11-08 VITALS — BP 112/76 | HR 96 | Temp 97.8°F | Ht 66.0 in | Wt 292.8 lb

## 2023-11-08 DIAGNOSIS — G4733 Obstructive sleep apnea (adult) (pediatric): Secondary | ICD-10-CM | POA: Diagnosis not present

## 2023-11-08 DIAGNOSIS — R590 Localized enlarged lymph nodes: Secondary | ICD-10-CM

## 2023-11-08 DIAGNOSIS — R911 Solitary pulmonary nodule: Secondary | ICD-10-CM

## 2023-11-08 DIAGNOSIS — Z6841 Body Mass Index (BMI) 40.0 and over, adult: Secondary | ICD-10-CM

## 2023-11-08 DIAGNOSIS — E662 Morbid (severe) obesity with alveolar hypoventilation: Secondary | ICD-10-CM

## 2023-11-08 DIAGNOSIS — I2729 Other secondary pulmonary hypertension: Secondary | ICD-10-CM

## 2023-11-08 DIAGNOSIS — I5032 Chronic diastolic (congestive) heart failure: Secondary | ICD-10-CM

## 2023-11-08 NOTE — Patient Instructions (Signed)
 It was nice meeting you in the clinic today.  You were referred to us  for evaluation of right middle lobe lung nodule.  This was seen on a CT scan performed in July followed by a PET scan performed in August.  Its location is very tricky for biopsy I recommend repeat CT scan in 3 months to assess for any changes through the nodule.  This nodule could be related to inflammation or malignancy.  We will reassess that in 3 months to see if there is any growth or changes.  It looks like there was a prescription sent for your CPAP machine by Dr. Neda.  Please call your DME company to clarify what they need to get you the machine.  I will see him in the clinic after 3 months.

## 2023-11-08 NOTE — Progress Notes (Signed)
 Subjective:   PATIENT ID: Gregory Mckenzie GENDER: male DOB: [birth year], MRN: [id number]   HPI 48 year old male with a past medical history of severe obstructive sleep apnea (AHI 19), morbid obesity, likely obesity hypoventilation syndrome, chronic respiratory failure on oxygen at home, pulmonary hypertension (likely group 3), diastolic heart failure, atrial flutter on Xarelto , hypertension, hyperlipidemia, former smoker quit in 2023 (27-pack-year smoking history), type 2 diabetes.  He was admitted to the hospital and February for heart failure exacerbation and volume overload.  He has been doing well since then but states that he has been wearing oxygen.  He denies any volume overload at this time and he has been taking his water  pill.  He has still not received his CPAP machine.   He is being referred to the pulmonary clinic for evaluation of a lung nodule that was seen on CT imaging of the chest performed on 09/11/2023 for lung cancer screening which shows a right middle lobe cluster measuring about 2-1/2 cm.  This was followed by a pet scan performed on 10/12/2023 that shows that the right middle lobe cluster has a maximum SUV of 5.3 with some dominance in the anterior nodular component.  He is also noted to have a 1.2 cm right paratracheal lymph node with a maximum SUV of 4.  Patient denies any infectious symptoms at this time including fever, chills, night sweats, cough, mucus production.  He reports shortness of breath chronically.  He denies any unintentional weight loss or loss of appetite.  He has been taking semaglutide .    Past Medical History:  Diagnosis Date   Atrial flutter (HCC)    a. CHA2DS2VASc = 4-->xarelto .   CAD (coronary artery disease)    a. 04/2020 Cath: LM nl, LAD 30ost/p, LCX nl, RCA 30p/m.   Chronic combined systolic and diastolic CHF (congestive heart failure) (HCC)    a. 03/2020 Echo: EF 40-45%; b. 05/2021 Echo: EF 40-45%; c. 10/2022 Echo: EF 55-60%, no rwma, sev  LVH, RVSP 23.1 mmHg.   Diabetes mellitus without complication (HCC)    Hyperlipidemia    Hypertension    NICM (nonischemic cardiomyopathy) (HCC)    Nonadherence to medication    Obesity    Sleep apnea, obstructive      Family History  Problem Relation Age of Onset   Hypertension Father    Hypertension Brother    Pulmonary embolism Brother    Pancreatic cancer Paternal Uncle      Social History   Socioeconomic History   Marital status: Single    Spouse name: Not on file   Number of children: Not on file   Years of education: Not on file   Highest education level: Not on file  Occupational History   Not on file  Tobacco Use   Smoking status: Former    Current packs/day: 0.00    Average packs/day: 1 pack/day for 26.9 years (26.9 ttl pk-yrs)    Types: Cigarettes    Start date: 04/06/1993    Quit date: 2022    Years since quitting: 3.7   Smokeless tobacco: Never  Vaping Use   Vaping status: Never Used  Substance and Sexual Activity   Alcohol  use: Not Currently   Drug use: Not Currently    Frequency: 7.0 times per week    Types: Marijuana    Comment: daily   Sexual activity: Yes    Partners: Female    Birth control/protection: None  Other Topics Concern   Not on file  Social History Narrative   Not on file   Social Drivers of Health   Financial Resource Strain: Low Risk  (04/09/2020)   Overall Financial Resource Strain (CARDIA)    Difficulty of Paying Living Expenses: Not very hard  Food Insecurity: No Food Insecurity (05/30/2023)   Hunger Vital Sign    Worried About Running Out of Food in the Last Year: Never true    Ran Out of Food in the Last Year: Never true  Transportation Needs: No Transportation Needs (05/30/2023)   PRAPARE - Administrator, Civil Service (Medical): No    Lack of Transportation (Non-Medical): No  Physical Activity: Inactive (04/09/2020)   Exercise Vital Sign    Days of Exercise per Week: 0 days    Minutes of Exercise per  Session: 0 min  Stress: Not on file  Social Connections: Not on file  Intimate Partner Violence: Not At Risk (05/30/2023)   Humiliation, Afraid, Rape, and Kick questionnaire    Fear of Current or Ex-Partner: No    Emotionally Abused: No    Physically Abused: No    Sexually Abused: No     No Known Allergies   Outpatient Medications Prior to Visit  Medication Sig Dispense Refill   atorvastatin  (LIPITOR ) 40 MG tablet Take 2 tablets (80 mg total) by mouth at bedtime. 180 tablet 3   carvedilol  (COREG ) 6.25 MG tablet Take 1 tablet (6.25 mg total) by mouth 2 (two) times daily with a meal. 180 tablet 3   empagliflozin  (JARDIANCE ) 10 MG TABS tablet Take 1 tablet (10 mg total) by mouth daily before breakfast. 30 tablet 3   OXYGEN Inhale 4 L into the lungs at bedtime.     rivaroxaban  (XARELTO ) 20 MG TABS tablet Take 1 tablet (20 mg total) by mouth daily with supper. 90 tablet 3   sacubitril -valsartan  (ENTRESTO ) 97-103 MG Take 1 tablet by mouth 2 (two) times daily. 60 tablet 6   Semaglutide , 1 MG/DOSE, 4 MG/3ML SOPN Inject 1 mg into the skin once a week. 3 mL 0   spironolactone  (ALDACTONE ) 25 MG tablet Take 1 tablet (25 mg total) by mouth once daily. 90 tablet 2   torsemide  (DEMADEX ) 20 MG tablet Take 2 tablets (40 mg total) by mouth daily. 60 tablet 11   No facility-administered medications prior to visit.    ROS Reviewed all systems and reported negative except as above     Objective:   Vitals:   11/08/23 1111  BP: 112/76  Pulse: 96  Temp: 97.8 F (36.6 C)  TempSrc: Temporal  SpO2: 90%  Weight: 292 lb 12.8 oz (132.8 kg)  Height: 5' 6 (1.676 m)    Physical Exam General: Middle-aged male, obese, not in acute distress Chest: Clear to auscultation bilaterally, distant breath sounds Heart: Irregular, normal S1, S2, distant heart sounds Extremities: Trace edema Neuro: Grossly intact    CBC    Component Value Date/Time   WBC 5.5 11/03/2023 1149   WBC 4.4 09/13/2023 1244    WBC 4.8 05/30/2023 1147   RBC 5.96 (H) 11/03/2023 1149   RBC 5.50 09/13/2023 1244   HGB 17.3 11/03/2023 1149   HCT 53.8 (H) 11/03/2023 1149   PLT 186 11/03/2023 1149   MCV 90 11/03/2023 1149   MCH 29.0 11/03/2023 1149   MCH 29.6 09/13/2023 1244   MCHC 32.2 11/03/2023 1149   MCHC 33.3 09/13/2023 1244   RDW 13.8 11/03/2023 1149   LYMPHSABS 0.9 09/13/2023 1244   MONOABS 0.8 09/13/2023  1244   EOSABS 0.1 09/13/2023 1244   BASOSABS 0.0 09/13/2023 1244     Chest imaging: CT chest interpretation as above.  PFT:     No data to display           Echo:  I reviewed his echocardiogram performed in February 2025 which shows an ejection fraction of 45 to 50% with mild decrease function and global hypokinesia, LVH and concentric hypertrophy with diastolic parameters consistent with grade 1 diastolic dysfunction.  RV systolic function is moderately reduced and RV size is enlarged.  PASP is estimated to be mildly elevated.   Right heart cath performed on 03/31/2023 showed a PA pressure of 51/27 (36), wedge pressure of 19 and PVR of 3.4  06/27/2023 sleep study with an AHI of 42.3 suggestive of moderate to severe obstructive sleep apnea.  ABG 03/31/23  PCO2 70.8  with a PH of 7.25       Assessment & Plan:   Assessment & Plan Lung nodule Cluster of nodules in the right middle lobe measuring up to 2 cm with very low PET avidity.  Difficult location for biopsy percutaneously or bronchoscopically.  This was discussed with the patient.  Plan to repeat CT imaging in 3 months to reassess.  Can consider bronchoscopy with EBUS to evaluate lymph nodes further based on CT results in 3 months.  Patient with multiple comorbidities including heart failure and pulmonary hypertension which would put him at high risk for complications with general anesthesia.  Also nodule is not 1 discrete nodule but rather a cluster with variable PET avidity which also makes it harder to target with a higher chance of a  nondiagnostic sample. Orders:   CT Chest Wo Contrast; Future  LAD (lymphadenopathy), mediastinal Patient with mediastinal lymphadenopathy.  No activity on PET scan.  Some calcifications suggestive of prior inflammatory process that has resolved.  Can consider EBUS for further evaluation in the future.    Morbid (severe) obesity due to excess calories [facility]) Advised patient to continue to try and lose weight.  He is trying his best and taking medications to assist him in weight loss.    Obesity hypoventilation syndrome (HCC) Patient likely has obesity hypoventilation syndrome based on his arterial blood gases in February and his severe sleep apnea as well as his obesity.  He will definitely have significant benefit for his obstructive sleep apnea but will also have some beneficial effect on his obesity hypoventilation syndrome with just CPAP.  In the future can consider BiPAP as well if needed    OSA (obstructive sleep apnea) Currently not on CPAP.  I reviewed his chart and it seems that there is a prescription sent for his CPAP machine with auto titration.  He had a split-night study which shows evidence of moderate to severe obstructive sleep apnea which improved with CPAP.  I advised him to call his DME company to investigate why he has not received the machine yet.    Other secondary pulmonary hypertension (HCC) He likely has a combination of group 2 and group 3 pulmonary hypertension.  He is already on anticoagulation for atrial flutter.  This is less likely to be Group 1.  Continue to wear oxygen to titrate to oxygen above 90%.  Strongly advised to pursue getting a CPAP machine.  Continue to use diuretics for heart failure and goal-directed medical therapy.    Chronic diastolic heart failure (HCC) Patient seems to be at baseline at this time.  No evidence of volume overload  although he is new to me.  Minimal lower extremity edema.    I personally spent a total of 62 minutes in the  care of the patient today including preparing to see the patient, getting/reviewing separately obtained history, performing a medically appropriate exam/evaluation, counseling and educating, placing orders, referring and communicating with other health care professionals, documenting clinical information in the EHR, independently interpreting results, and coordinating care.   Zola Herter, MD [facility] Office: [phone]

## 2023-11-10 ENCOUNTER — Telehealth: Payer: Self-pay

## 2023-11-10 NOTE — Telephone Encounter (Signed)
 Copied from CRM #[id number]. Topic: Clinical - Order For Equipment >> Nov 08, 2023  3:11 PM Devaughn RAMAN wrote: Reason for CRM: Patient called regarding order for CPAP machine. Patient is trying to locate his DME provider for his CPAP and is inquiring of their contact information, contacted [facility] [place] spoke with Almeda we both searched chart and could not locate this information, Almeda attempted to get someone in triage currently busy, advises to send CRM. Advised patient a follow up call would be made regarding this information. Patient was thankful and verbalized understanding.   Tried to reach out to patient   His DME is   [facility]  [phone]

## 2023-11-22 ENCOUNTER — Encounter: Admitting: Emergency Medicine

## 2023-11-24 ENCOUNTER — Other Ambulatory Visit: Payer: Self-pay

## 2023-11-24 ENCOUNTER — Telehealth: Payer: Self-pay | Admitting: Pharmacist

## 2023-11-24 MED ORDER — OZEMPIC (2 MG/DOSE) 8 MG/3ML ~~LOC~~ SOPN
2.0000 mg | PEN_INJECTOR | SUBCUTANEOUS | 11 refills | Status: AC
  Filled 2023-11-24 – 2023-12-06 (×2): qty 3, 28d supply, fill #0
  Filled 2024-01-10: qty 3, 28d supply, fill #1
  Filled 2024-02-25: qty 3, 28d supply, fill #2

## 2023-11-24 NOTE — Telephone Encounter (Signed)
 Patient tolerating 1 mg Ozempic  well, will increase to 2 mg.

## 2023-12-06 ENCOUNTER — Other Ambulatory Visit: Payer: Self-pay

## 2024-01-10 ENCOUNTER — Other Ambulatory Visit: Payer: Self-pay

## 2024-01-10 ENCOUNTER — Other Ambulatory Visit: Payer: Self-pay | Admitting: Cardiology

## 2024-01-10 MED ORDER — EMPAGLIFLOZIN 10 MG PO TABS
10.0000 mg | ORAL_TABLET | Freq: Every day | ORAL | 3 refills | Status: AC
  Filled 2024-01-10: qty 30, 30d supply, fill #0
  Filled 2024-02-15: qty 30, 30d supply, fill #1
  Filled 2024-03-20: qty 30, 30d supply, fill #2

## 2024-01-10 MED FILL — Carvedilol Tab 6.25 MG: ORAL | 90 days supply | Qty: 180 | Fill #1 | Status: AC

## 2024-01-15 ENCOUNTER — Inpatient Hospital Stay: Attending: Oncology

## 2024-01-15 ENCOUNTER — Encounter: Payer: Self-pay | Admitting: Oncology

## 2024-01-15 ENCOUNTER — Other Ambulatory Visit: Payer: Self-pay

## 2024-01-15 ENCOUNTER — Inpatient Hospital Stay: Admitting: Oncology

## 2024-01-15 VITALS — BP 120/75 | HR 81 | Temp 97.5°F | Resp 18 | Wt 252.5 lb

## 2024-01-15 DIAGNOSIS — I110 Hypertensive heart disease with heart failure: Secondary | ICD-10-CM | POA: Diagnosis not present

## 2024-01-15 DIAGNOSIS — D751 Secondary polycythemia: Secondary | ICD-10-CM | POA: Insufficient documentation

## 2024-01-15 DIAGNOSIS — Z87891 Personal history of nicotine dependence: Secondary | ICD-10-CM | POA: Diagnosis not present

## 2024-01-15 DIAGNOSIS — R911 Solitary pulmonary nodule: Secondary | ICD-10-CM | POA: Insufficient documentation

## 2024-01-15 DIAGNOSIS — R918 Other nonspecific abnormal finding of lung field: Secondary | ICD-10-CM

## 2024-01-15 DIAGNOSIS — G4733 Obstructive sleep apnea (adult) (pediatric): Secondary | ICD-10-CM | POA: Diagnosis not present

## 2024-01-15 LAB — CMP (CANCER CENTER ONLY)
ALT: 38 U/L (ref 0–44)
AST: 21 U/L (ref 15–41)
Albumin: 4.3 g/dL (ref 3.5–5.0)
Alkaline Phosphatase: 128 U/L — ABNORMAL HIGH (ref 38–126)
Anion gap: 13 (ref 5–15)
BUN: 16 mg/dL (ref 6–20)
CO2: 28 mmol/L (ref 22–32)
Calcium: 9.5 mg/dL (ref 8.9–10.3)
Chloride: 97 mmol/L — ABNORMAL LOW (ref 98–111)
Creatinine: 1.33 mg/dL — ABNORMAL HIGH (ref 0.61–1.24)
GFR, Estimated: >60 mL/min (ref >60)
Glucose, Bld: 104 mg/dL — ABNORMAL HIGH (ref 70–99)
Potassium: 3.2 mmol/L — ABNORMAL LOW (ref 3.5–5.1)
Sodium: 138 mmol/L (ref 135–145)
Total Bilirubin: 0.8 mg/dL (ref 0.0–1.2)
Total Protein: 8.1 g/dL (ref 6.5–8.1)

## 2024-01-15 LAB — CBC (CANCER CENTER ONLY)
HCT: 44.5 % (ref 39.0–52.0)
Hemoglobin: 14.8 g/dL (ref 13.0–17.0)
MCH: 28.1 pg (ref 26.0–34.0)
MCHC: 33.3 g/dL (ref 30.0–36.0)
MCV: 84.4 fL (ref 80.0–100.0)
Platelet Count: 206 K/uL (ref 150–400)
RBC: 5.27 MIL/uL (ref 4.22–5.81)
RDW: 15 % (ref 11.5–15.5)
WBC Count: 6.3 K/uL (ref 4.0–10.5)
nRBC: 0 % (ref 0.0–0.2)

## 2024-01-15 NOTE — Assessment & Plan Note (Signed)
 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 Negative BCR-ABL 1 FISH, normal carbo monoxide level, elevated erythropoietin , negative JAK2 V617F mutation with reflex to other mutations, less likely primary primary myeloproliferative disorder  He uses CPAP and has also had weight loss since last visit.  hematocrit is less than 55.  I will hold off phlebotomy

## 2024-01-15 NOTE — Progress Notes (Signed)
 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 Negative BCR-ABL 1 FISH, normal carbo monoxide level, elevated erythropoietin , negative JAK2 V617F mutation with reflex to other mutations, less likely primary primary myeloproliferative disorder  He uses CPAP and has also had weight loss since last visit.  hematocrit is less than 55.  I will hold off phlebotomy    Lung nodule PET showed low hypermetabolic activity.  He has established care with pulmonology. Biopsy is at high risk.  He will repeat CT chest ordered by pulmonology   Orders Placed This Encounter  Procedures   CBC (Cancer Center Only)    Standing Status:   Future    Expected Date:   07/15/2024    Expiration Date:   10/13/2024   CMP (Cancer Center only)    Standing Status:   Future    Expected Date:   07/15/2024    Expiration Date:   10/13/2024   CBC (Cancer Center Only)    Standing Status:   Future    Expected Date:   04/14/2024    Expiration Date:   07/13/2024   Follow-up in 6 months.  Repeat cbc in 3 months. All questions were answered. The patient knows to call the clinic with any problems, questions or concerns.  Zelphia Cap, MD, PhD [facility] Hematology Oncology 01/15/2024   HISTORY OF PRESENTING ILLNESS:   Gregory Mckenzie is a  48 y.o.  male with PMH listed below was seen in consultation at the request of  No ref. provider found  for evaluation of elevated hemoglobin  He has consistently elevated hemoglobin and hematocrit levels, with the most recent values on May 17, 2023, showing a hematocrit of 61 and a hemoglobin level of 19.6.  He does not have a primary care doctor and is attempting to establish care with one.  He has a history of congestive heart failure, diagnosed in 2022, which led him to  quit smoking. He is managing his heart condition with medication and is learning to control his fluid intake.   He also has a history of sleep apnea but has not yet been scheduled for a sleep study.  Patient was seen by pulmonology.  He is waiting for a call back to arrange the study. He uses oxygen at night since 2022 but has not been using a CPAP machine.  Patient denies any testosterone use.  INTERVAL HISTORY Gregory Mckenzie is a 48 y.o. male who has above history reviewed by me today presents for follow up visit for Erythrocytosis.   Patient has sleep apnea and is going to start using CPAP machine. Today he reports feeling well.  No new complaints. He has lost weight, on ozempic .   MEDICAL HISTORY:  Past Medical History:  Diagnosis Date   Atrial flutter (HCC)    a. CHA2DS2VASc = 4-->xarelto .   CAD (coronary artery disease)    a. 04/2020 Cath: LM nl, LAD 30ost/p, LCX nl, RCA 30p/m.   Chronic combined systolic and diastolic CHF (congestive heart failure) (HCC)    a. 03/2020 Echo: EF 40-45%; b. 05/2021 Echo: EF 40-45%; c. 10/2022 Echo: EF 55-60%, no rwma, sev LVH, RVSP 23.1 mmHg.   Diabetes mellitus without complication (HCC)    Hyperlipidemia    Hypertension    NICM (nonischemic cardiomyopathy) (HCC)    Nonadherence to medication  Obesity    Sleep apnea, obstructive     SURGICAL HISTORY: Past Surgical History:  Procedure Laterality Date   RIGHT HEART CATH N/A 03/31/2023   Procedure: RIGHT HEART CATH;  Surgeon: Cherrie Toribio SAUNDERS, MD;  Location: [facility] INVASIVE CV LAB;  Service: Cardiovascular;  Laterality: N/A;  Heart Failure with mid-range ejection fraction   RIGHT/LEFT HEART CATH AND CORONARY ANGIOGRAPHY N/A 05/08/2020   Procedure: RIGHT/LEFT HEART CATH AND CORONARY ANGIOGRAPHY;  Surgeon: Rolan Ezra RAMAN, MD;  Location: [facility] INVASIVE CV LAB;  Service: Cardiovascular;  Laterality: N/A;    SOCIAL HISTORY: Social History   Socioeconomic History   Marital status: Single     Spouse name: Not on file   Number of children: Not on file   Years of education: Not on file   Highest education level: Not on file  Occupational History   Not on file  Tobacco Use   Smoking status: Former    Current packs/day: 0.00    Average packs/day: 1 pack/day for 26.9 years (26.9 ttl pk-yrs)    Types: Cigarettes    Start date: 04/06/1993    Quit date: 2022    Years since quitting: 3.9   Smokeless tobacco: Never  Vaping Use   Vaping status: Never Used  Substance and Sexual Activity   Alcohol  use: Not Currently   Drug use: Not Currently    Frequency: 7.0 times per week    Types: Marijuana    Comment: daily   Sexual activity: Yes    Partners: Female    Birth control/protection: None  Other Topics Concern   Not on file  Social History Narrative   Not on file   Social Drivers of Health   Financial Resource Strain: Low Risk  (04/09/2020)   Overall Financial Resource Strain (CARDIA)    Difficulty of Paying Living Expenses: Not very hard  Food Insecurity: No Food Insecurity (05/30/2023)   Hunger Vital Sign    Worried About Running Out of Food in the Last Year: Never true    Ran Out of Food in the Last Year: Never true  Transportation Needs: No Transportation Needs (05/30/2023)   PRAPARE - Administrator, Civil Service (Medical): No    Lack of Transportation (Non-Medical): No  Physical Activity: Inactive (04/09/2020)   Exercise Vital Sign    Days of Exercise per Week: 0 days    Minutes of Exercise per Session: 0 min  Stress: Not on file  Social Connections: Not on file  Intimate Partner Violence: Not At Risk (05/30/2023)   Humiliation, Afraid, Rape, and Kick questionnaire    Fear of Current or Ex-Partner: No    Emotionally Abused: No    Physically Abused: No    Sexually Abused: No    FAMILY HISTORY: Family History  Problem Relation Age of Onset   Hypertension Father    Hypertension Brother    Pulmonary embolism Brother    Pancreatic cancer Paternal  Uncle     ALLERGIES:  has no known allergies.  MEDICATIONS:  Current Outpatient Medications  Medication Sig Dispense Refill   atorvastatin  (LIPITOR ) 40 MG tablet Take 2 tablets (80 mg total) by mouth at bedtime. 180 tablet 3   carvedilol  (COREG ) 6.25 MG tablet Take 1 tablet (6.25 mg total) by mouth 2 (two) times daily with a meal. 180 tablet 3   empagliflozin  (JARDIANCE ) 10 MG TABS tablet Take 1 tablet (10 mg total) by mouth daily before breakfast. 30 tablet 3   OXYGEN Inhale 4  L into the lungs at bedtime.     rivaroxaban  (XARELTO ) 20 MG TABS tablet Take 1 tablet (20 mg total) by mouth daily with supper. 90 tablet 3   sacubitril -valsartan  (ENTRESTO ) 97-103 MG Take 1 tablet by mouth 2 (two) times daily. 60 tablet 6   Semaglutide , 2 MG/DOSE, (OZEMPIC , 2 MG/DOSE,) 8 MG/3ML SOPN Inject 2 mg into the skin once a week. 3 mL 11   spironolactone  (ALDACTONE ) 25 MG tablet Take 1 tablet (25 mg total) by mouth once daily. 90 tablet 2   torsemide  (DEMADEX ) 20 MG tablet Take 2 tablets (40 mg total) by mouth daily. 60 tablet 11   No current facility-administered medications for this visit.    Review of Systems  Constitutional:  Positive for fatigue. Negative for appetite change, chills, fever and unexpected weight change.  HENT:   Negative for hearing loss and voice change.   Eyes:  Negative for eye problems and icterus.  Respiratory:  Negative for chest tightness, cough and shortness of breath.   Cardiovascular:  Negative for chest pain and leg swelling.  Gastrointestinal:  Negative for abdominal distention and abdominal pain.  Endocrine: Negative for hot flashes.  Genitourinary:  Negative for difficulty urinating, dysuria and frequency.   Musculoskeletal:  Negative for arthralgias.  Skin:  Negative for itching and rash.  Neurological:  Negative for light-headedness and numbness.  Hematological:  Negative for adenopathy. Does not bruise/bleed easily.  Psychiatric/Behavioral:  Negative for  confusion.    PHYSICAL EXAMINATION:  Vitals:   01/15/24 1314  BP: 120/75  Pulse: 81  Resp: 18  Temp: (!) 97.5 F (36.4 C)  SpO2: 95%   Filed Weights   01/15/24 1314  Weight: 252 lb 8 oz (114.5 kg)    Physical Exam Constitutional:      General: He is not in acute distress.    Appearance: He is obese.  HENT:     Head: Normocephalic and atraumatic.  Eyes:     General: No scleral icterus. Cardiovascular:     Rate and Rhythm: Normal rate and regular rhythm.     Heart sounds: Normal heart sounds.  Pulmonary:     Effort: Pulmonary effort is normal. No respiratory distress.     Breath sounds: Normal breath sounds. No wheezing.  Abdominal:     General: There is no distension.     Palpations: Abdomen is soft.  Musculoskeletal:        General: No deformity. Normal range of motion.     Cervical back: Normal range of motion and neck supple.  Skin:    General: Skin is warm and dry.     Findings: No erythema or rash.  Neurological:     Mental Status: He is alert and oriented to person, place, and time. Mental status is at baseline.  Psychiatric:        Mood and Affect: Mood normal.     LABORATORY DATA:  I have reviewed the data as listed    Latest Ref Rng & Units 01/15/2024   12:42 PM 11/03/2023   11:49 AM 09/13/2023   12:44 PM  CBC  WBC 4.0 - 10.5 K/uL 6.3  5.5  4.4   Hemoglobin 13.0 - 17.0 g/dL 85.1  82.6  83.6   Hematocrit 39.0 - 52.0 % 44.5  53.8  49.0   Platelets 150 - 400 K/uL 206  186  155       Latest Ref Rng & Units 01/15/2024   12:42 PM 11/03/2023   11:49  AM 06/14/2023   10:40 AM  CMP  Glucose 70 - 99 mg/dL 895  877  898   BUN 6 - 20 mg/dL 16  21  24    Creatinine 0.61 - 1.24 mg/dL 8.66  8.45  8.68   Sodium 135 - 145 mmol/L 138  141  139   Potassium 3.5 - 5.1 mmol/L 3.2  3.6  4.2   Chloride 98 - 111 mmol/L 97  97  99   CO2 22 - 32 mmol/L 28  24  21    Calcium  8.9 - 10.3 mg/dL 9.5  89.6  9.8   Total Protein 6.5 - 8.1 g/dL 8.1  7.7  7.4   Total Bilirubin  0.0 - 1.2 mg/dL 0.8  0.5  0.7   Alkaline Phos 38 - 126 U/L 128  160  242   AST 15 - 41 U/L 21  27  41   ALT 0 - 44 U/L 38  49  90       RADIOGRAPHIC STUDIES: I have personally reviewed the radiological images as listed and agreed with the findings in the report. No results found.

## 2024-01-15 NOTE — Assessment & Plan Note (Signed)
 PET showed low hypermetabolic activity.  He has established care with pulmonology. Biopsy is at high risk.  He will repeat CT chest ordered by pulmonology

## 2024-01-16 ENCOUNTER — Inpatient Hospital Stay: Admission: RE | Admit: 2024-01-16 | Discharge: 2024-01-16

## 2024-01-16 DIAGNOSIS — R911 Solitary pulmonary nodule: Secondary | ICD-10-CM

## 2024-01-26 ENCOUNTER — Encounter: Admitting: Cardiology

## 2024-01-30 ENCOUNTER — Ambulatory Visit: Admitting: Family

## 2024-02-05 ENCOUNTER — Ambulatory Visit

## 2024-02-05 ENCOUNTER — Other Ambulatory Visit: Payer: Self-pay

## 2024-02-05 ENCOUNTER — Other Ambulatory Visit: Payer: Self-pay | Admitting: Family

## 2024-02-05 ENCOUNTER — Encounter (HOSPITAL_COMMUNITY): Payer: Self-pay

## 2024-02-05 VITALS — BP 112/74 | HR 101 | Ht 66.0 in | Wt 282.0 lb

## 2024-02-05 DIAGNOSIS — R918 Other nonspecific abnormal finding of lung field: Secondary | ICD-10-CM

## 2024-02-05 DIAGNOSIS — Z87891 Personal history of nicotine dependence: Secondary | ICD-10-CM

## 2024-02-05 DIAGNOSIS — R590 Localized enlarged lymph nodes: Secondary | ICD-10-CM

## 2024-02-05 DIAGNOSIS — G4733 Obstructive sleep apnea (adult) (pediatric): Secondary | ICD-10-CM | POA: Diagnosis not present

## 2024-02-05 DIAGNOSIS — R911 Solitary pulmonary nodule: Secondary | ICD-10-CM

## 2024-02-05 MED ORDER — SPIRONOLACTONE 25 MG PO TABS
25.0000 mg | ORAL_TABLET | Freq: Every day | ORAL | 1 refills | Status: AC
  Filled 2024-02-05: qty 90, 90d supply, fill #0

## 2024-02-05 MED FILL — Rivaroxaban Tab 20 MG: ORAL | 90 days supply | Qty: 90 | Fill #2 | Status: AC

## 2024-02-05 NOTE — Progress Notes (Signed)
 "   Subjective:   PATIENT ID: Gregory Mckenzie GENDER: male DOB: 21-Dec-1975, MRN: [id number]   HPI Discussed the use of AI scribe software for clinical note transcription with the patient, who gave verbal consent to proceed.  History of Present Illness Gregory Mckenzie Gregory Mckenzie is a 48 year old male who presents with enlarged lymph nodes and right lung findings.  He was initially referred to me for multiple scattered pulmonary nodules but the most concerning was peripheral basilar right middle lobe cluster for which she underwent a PET scan in August which showed minimal activity.  He was also noted to have slightly enlarged lymph nodes although although not hypermetabolic with some evidence of calcification concerning for an inflammatory process.  Since then he had a repeat CT scan and December which showed that his right middle lobe infiltrate appears slightly improved.  His lymphadenopathy however is progressively worse.  He has a history of findings on a CT scan that showed an abnormality in the right lung and enlarged lymph nodes.  He experiences a mild cough intermittently. No shortness of breath, wheezing, fevers, chills, night sweats, or significant changes in appetite. He notes some weight gain after a period of weight loss.  He is scheduled for a cardiac MRI to evaluate potential cardiac involvement, with concerns for sarcoidosis or Fabry's disease. The MRI is scheduled for Wednesday.  Regarding his sleep apnea management, he has issues with his CPAP mask due to air leakage, possibly related to his beard. He is considering getting a new mask to address this issue.     Past Medical History:  Diagnosis Date   Atrial flutter (HCC)    a. CHA2DS2VASc = 4-->xarelto .   CAD (coronary artery disease)    a. 04/2020 Cath: LM nl, LAD 30ost/p, LCX nl, RCA 30p/m.   Chronic combined systolic and diastolic CHF (congestive heart failure) (HCC)    a. 03/2020 Echo: EF 40-45%; b. 05/2021  Echo: EF 40-45%; c. 10/2022 Echo: EF 55-60%, no rwma, sev LVH, RVSP 23.1 mmHg.   Diabetes mellitus without complication (HCC)    Hyperlipidemia    Hypertension    NICM (nonischemic cardiomyopathy) (HCC)    Nonadherence to medication    Obesity    Sleep apnea, obstructive      Family History  Problem Relation Age of Onset   Hypertension Father    Hypertension Brother    Pulmonary embolism Brother    Pancreatic cancer Paternal Uncle      Social History   Socioeconomic History   Marital status: Single    Spouse name: Not on file   Number of children: Not on file   Years of education: Not on file   Highest education level: Not on file  Occupational History   Not on file  Tobacco Use   Smoking status: Former    Current packs/day: 0.00    Average packs/day: 1 pack/day for 26.9 years (26.9 ttl pk-yrs)    Types: Cigarettes    Start date: 04/06/1993    Quit date: 2022    Years since quitting: 3.9   Smokeless tobacco: Never  Vaping Use   Vaping status: Never Used  Substance and Sexual Activity   Alcohol  use: Not Currently   Drug use: Not Currently    Frequency: 7.0 times per week    Types: Marijuana    Comment: daily   Sexual activity: Yes    Partners: Female    Birth control/protection: None  Other Topics Concern  Not on file  Social History Narrative   Not on file   Social Drivers of Health   Tobacco Use: Medium Risk (02/05/2024)   Patient History    Smoking Tobacco Use: Former    Smokeless Tobacco Use: Never    Passive Exposure: Not on file  Financial Resource Strain: Not on file  Food Insecurity: No Food Insecurity (05/30/2023)   Hunger Vital Sign    Worried About Running Out of Food in the Last Year: Never true    Ran Out of Food in the Last Year: Never true  Transportation Needs: No Transportation Needs (05/30/2023)   PRAPARE - Administrator, Civil Service (Medical): No    Lack of Transportation (Non-Medical): No  Physical Activity: Not on  file  Stress: Not on file  Social Connections: Not on file  Intimate Partner Violence: Not At Risk (05/30/2023)   Humiliation, Afraid, Rape, and Kick questionnaire    Fear of Current or Ex-Partner: No    Emotionally Abused: No    Physically Abused: No    Sexually Abused: No  Depression (PHQ2-9): Low Risk (05/30/2023)   Depression (PHQ2-9)    PHQ-2 Score: 0  Alcohol  Screen: Not on file  Housing: Low Risk (05/30/2023)   Housing Stability Vital Sign    Unable to Pay for Housing in the Last Year: No    Number of Times Moved in the Last Year: 0    Homeless in the Last Year: No  Utilities: Not At Risk (05/30/2023)   [employer] Utilities    Threatened with loss of utilities: No  Health Literacy: Not on file     Allergies[1]   Outpatient Medications Prior to Visit  Medication Sig Dispense Refill   atorvastatin  (LIPITOR ) 40 MG tablet Take 2 tablets (80 mg total) by mouth at bedtime. 180 tablet 3   carvedilol  (COREG ) 6.25 MG tablet Take 1 tablet (6.25 mg total) by mouth 2 (two) times daily with a meal. 180 tablet 3   empagliflozin  (JARDIANCE ) 10 MG TABS tablet Take 1 tablet (10 mg total) by mouth daily before breakfast. 30 tablet 3   OXYGEN Inhale 4 L into the lungs at bedtime.     rivaroxaban  (XARELTO ) 20 MG TABS tablet Take 1 tablet (20 mg total) by mouth daily with supper. 90 tablet 3   sacubitril -valsartan  (ENTRESTO ) 97-103 MG Take 1 tablet by mouth 2 (two) times daily. 60 tablet 6   Semaglutide , 2 MG/DOSE, (OZEMPIC , 2 MG/DOSE,) 8 MG/3ML SOPN Inject 2 mg into the skin once a week. 3 mL 11   spironolactone  (ALDACTONE ) 25 MG tablet Take 1 tablet (25 mg total) by mouth once daily. 90 tablet 2   torsemide  (DEMADEX ) 20 MG tablet Take 2 tablets (40 mg total) by mouth daily. 60 tablet 11   No facility-administered medications prior to visit.    ROS Reviewed all systems and reported negative except as above     Objective:   Vitals:   02/05/24 0948  BP: 112/74  Pulse: (!) 101  SpO2: 95%   Weight: 282 lb (127.9 kg)  Height: 5' 6 (1.676 m)    Physical Exam Physical Exam GENERAL: Appropriate to age, no acute distress. HEAD EYES EARS NOSE THROAT: Moist mucous membranes, atraumatic, normocephalic. CHEST: Clear to auscultation bilaterally, no wheezing, no crackles, no rales. CARDIAC: Regular rate and rhythm, normal S1, normal S2, no murmurs, no rubs, no gallops. ABDOMEN: Soft, nontender. NEUROLOGICAL: Motor and sensation grossly intact, alert and oriented times X 3. EXTREMITIES:  Warm, well perfused, no edema.     CBC    Component Value Date/Time   WBC 6.3 01/15/2024 1242   WBC 4.8 05/30/2023 1147   RBC 5.27 01/15/2024 1242   HGB 14.8 01/15/2024 1242   HGB 17.3 11/03/2023 1149   HCT 44.5 01/15/2024 1242   HCT 53.8 (H) 11/03/2023 1149   PLT 206 01/15/2024 1242   PLT 186 11/03/2023 1149   MCV 84.4 01/15/2024 1242   MCV 90 11/03/2023 1149   MCH 28.1 01/15/2024 1242   MCHC 33.3 01/15/2024 1242   RDW 15.0 01/15/2024 1242   RDW 13.8 11/03/2023 1149   LYMPHSABS 0.9 09/13/2023 1244   MONOABS 0.8 09/13/2023 1244   EOSABS 0.1 09/13/2023 1244   BASOSABS 0.0 09/13/2023 1244     Chest imaging: CT chest 01/22/2024 with evidence of thoracic lymphadenopathy that appears worsened compared to the prior CT scan and PET scan in August.        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 Differential includes sarcoidosis, infection, or malignancy. PET scan negative, repeat CT shows improvement of the right middle lobe infiltrate, suggesting inflammation. Cardiac MRI scheduled for sarcoidosis or Fabry's disease evaluation.  I discussed with the patient potential options of treatment which includes pursuing EBUS for further evaluation of his lymph nodes versus continued serial monitoring with repeat CT scans which is less favored at this  time.  Patient would like to discuss this with his wife before making a decision.   - Ordered CT scan in May to monitor lymphadenopathy. - Scheduled cardiac MRI to assess for sarcoidosis or Fabry's disease. - Discussed bronchoscopy with biopsy as an option for definitive diagnosis, with risks including 2% chance of complications such as bleeding, pneumonia, or pneumothorax. - Advised him to contact office to decide on biopsy or continued monitoring.  Right middle lobe pulmonary infiltrate Right lung nodule with improvement on repeat CT scan. PET scan negative, likely inflammatory rather than malignant. - Continue monitoring with CT scan in May.  Obstructive sleep apnea Issues with CPAP mask due to beard causing air leakage. - Advised shaving beard to reduce air leakage from CPAP mask. - Advise follow-up with Dr. Neda Zola Herter, MD [facility] Office: [phone]        [1] No Known Allergies  "

## 2024-02-05 NOTE — Patient Instructions (Signed)
" °  VISIT SUMMARY: During today's visit, we discussed your enlarged lymph nodes and right lung findings, as well as your sleep apnea management. We reviewed your recent CT scan results and planned further evaluations to determine the cause of your symptoms.  YOUR PLAN: -MEDIASTINAL LYMPHADENOPATHY, SUSPECTED SARCOIDOSIS: Mediastinal lymphadenopathy refers to enlarged lymph nodes in the chest area. We suspect sarcoidosis, which is an inflammatory disease that can affect multiple organs.  Your cardiologist scheduled a cardiac MRI to evaluate for sarcoidosis or Fabry's disease. We also discussed the option of a bronchoscopy with biopsy to get a definitive diagnosis, though it carries some risks. Please contact our office to decide whether you want to proceed with the biopsy or continue monitoring.   -OBSTRUCTIVE SLEEP APNEA: Obstructive sleep apnea is a condition where your breathing stops and starts during sleep. You are experiencing issues with your CPAP mask due to air leakage, likely caused by your beard. We recommend shaving your beard to reduce the air leakage.  INSTRUCTIONS: Please follow up with the cardiac MRI scheduled for Wednesday. Contact our office to decide on whether to proceed with the bronchoscopy and biopsy or to continue monitoring your condition. We will also re-evaluate your condition with a CT scan in May.  We recommend pursuing biopsy for further evaluation of the large lymph nodes in the chest however repeating CT scan in 6 months is an alternative option.       Contains text generated by [employer].   "

## 2024-02-07 ENCOUNTER — Ambulatory Visit

## 2024-02-12 ENCOUNTER — Encounter: Payer: Self-pay | Admitting: *Deleted

## 2024-02-19 ENCOUNTER — Telehealth: Payer: Self-pay | Admitting: Family

## 2024-02-19 NOTE — Telephone Encounter (Signed)
 Called to confirm/remind patient of their appointment [facility] on [facility].   Appointment:   [x] Confirmed  [] Left mess   [] No answer/No voice mail  [] VM Full/unable to leave message  [] Phone not in service  Patient reminded to bring all medications and/or complete list.  Confirmed patient has transportation. Gave directions, instructed to utilize valet parking.

## 2024-02-19 NOTE — Progress Notes (Signed)
 "  Advanced Heart Failure Clinic Note    PCP: Alla Amis, MD (new pt appt on 02/27/24) HF Cardiology: Dr. Rolan  Chief complaint: shortness of breath   HPI: Gregory Mckenzie is a 49 y.o. male with history of CKD stage 3, type 2 diabetes, HTN, paroxysmal atrial fibrillation, OHS/OSA, and chronic HF with mid range EF and prominent RV dysfunction returns for followup of CHF. Patient had LHC in 2022 with mild nonobstructive CAD.  Patient was admitted in 2/25 with volume overload and hypoxemic respiratory failure.  He was intubated and diuresed.    He presents today for HF follow-up visit with a chief complaint of minimal shortness of breath. Has associated fatigue. Has recently started walking for exercise as he has a goal to get his weight down to 200 pounds. 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 Does wear CPAP when sleeping whether daytime or nighttime. Has ordered a new CPAP mask and also trimmed his beard. Denies chest pain, palpitations, dizziness, pedal edema.   He has seen pulmonology regarding lung nodules and there is a suspicion of him having sarcoid. Discussion of bronchoscopy with biopsy vas serial monitoring with CT's. Currently has cMRI later this month and CT scan scheduled 05/26.  ECG 11/03/23: NSR, LAFB, poor RWP  Labs (3/25): K 3.9, creatinine 1.18 Labs (4/25): hgb 17.6, AST 34, ALT 80, TGs 206, LDL 81, BNP 87, K 3.8, creatinine 1.39, AST 41, ALT 90 Labs (9/25): K 3.6, creatinine 1.54, BNP 11.1, Hg 17.3 Labs (12/25): K 3.2, creatinine 1.33, LFT's normal, Hg 14.8  PMH: 1. CKD stage 3 2. Type 2 diabetes 3. HTN 4. Atrial fibrillation: Paroxysmal 5. OHS/OSA: Chronic hypoxemic respiratory failure on home oxygen (now using only at night). Trying to get CPAP.  6. HF with mid range EF:  - Echo in 2/23: EF 40-45%.  - Echo (2/25): EF 45-50%, severe LVH, moderate RV enlargement with moderate RV dysfunction.  - LHC (5/22):  Mild nonobstructive CAD.  - RHC (2/25): mean RA 8, PA 51/27, mean PCWP 19, CI 2.3 F/2.5 T.  PVR 2.4 WU, PAPi 3.  7. Hyperlipidemia 8. Polycythemia: Likely due to chronic hypoxemia - JAK2 mutation negative.  9. Elevated LFTs: Abdominal US  unremarkable in 8/25.  10. 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  No known sarcoidosis.   Social History   Socioeconomic History   Marital status: Single    Spouse name: Not on file   Number of children: Not on file   Years of education: Not on file   Highest education level: Not on file  Occupational History   Not on file  Tobacco Use   Smoking status: Former    Current packs/day: 0.00    Average packs/day: 1 pack/day for 26.9 years (26.9 ttl pk-yrs)    Types: Cigarettes    Start date: 04/06/1993    Quit date: 2022    Years since quitting: 4.0   Smokeless tobacco: Never  Vaping Use   Vaping status: Never Used  Substance and Sexual Activity   Alcohol  use: Not Currently   Drug use: Not Currently    Frequency: 7.0 times per week    Types: Marijuana    Comment: daily   Sexual activity: Yes    Partners: Female    Birth control/protection: None  Other Topics Concern   Not on file  Social History Narrative   Not on file   Social Drivers of Health   Tobacco Use: Medium Risk (02/05/2024)   Patient History    Smoking Tobacco Use: Former    Smokeless Tobacco Use: Never    Passive Exposure: Not on file  Financial Resource Strain: Not on file  Food Insecurity: No Food Insecurity (05/30/2023)   Hunger Vital Sign    Worried About Running Out of Food in the Last Year: Never true    Ran Out of Food in the Last Year: Never true  Transportation Needs: No Transportation Needs (05/30/2023)   PRAPARE - Administrator, Civil Service (Medical): No    Lack of Transportation (Non-Medical): No  Physical Activity: Not on file  Stress: Not on file   Social Connections: Not on file  Intimate Partner Violence: Not At Risk (05/30/2023)   Humiliation, Afraid, Rape, and Kick questionnaire    Fear of Current or Ex-Partner: No    Emotionally Abused: No    Physically Abused: No    Sexually Abused: No  Depression (PHQ2-9): Low Risk (05/30/2023)   Depression (PHQ2-9)    PHQ-2 Score: 0  Alcohol  Screen: Not on file  Housing: Low Risk (05/30/2023)   Housing Stability Vital Sign    Unable to Pay for Housing in the Last Year: No    Number of Times Moved in the Last Year: 0    Homeless in the Last Year: No  Utilities: Not At Risk (05/30/2023)   AHC Utilities    Threatened with loss of utilities: No  Health Literacy: Not on file   ROS: All systems reviewed and negative except as per HPI.   Current Outpatient Medications  Medication Sig Dispense Refill   atorvastatin  (LIPITOR ) 40 MG tablet Take 2 tablets (80 mg total) by mouth at bedtime. 180 tablet 3   carvedilol  (COREG ) 6.25 MG tablet Take 1 tablet (6.25 mg total) by mouth 2 (two) times daily with a meal. 180 tablet 3   empagliflozin  (JARDIANCE ) 10 MG TABS tablet Take 1 tablet (10 mg total) by mouth daily before breakfast. 30 tablet 3   OXYGEN Inhale 4 L into the lungs at bedtime.     rivaroxaban  (XARELTO ) 20 MG TABS tablet Take 1 tablet (20 mg total) by mouth daily with supper. 90 tablet 3   sacubitril -valsartan  (ENTRESTO ) 97-103 MG Take 1 tablet by mouth 2 (two) times daily. 60 tablet 6   Semaglutide , 2 MG/DOSE, (OZEMPIC , 2 MG/DOSE,) 8 MG/3ML SOPN Inject 2 mg into the skin once a week. 3 mL 11   spironolactone  (ALDACTONE ) 25 MG tablet Take 1 tablet (25 mg total) by mouth once daily. 90 tablet 1   torsemide  (DEMADEX ) 20 MG tablet Take 2 tablets (40 mg total) by mouth daily. 60 tablet 11   No current facility-administered medications for this visit.   Vitals:   02/20/24 0927  BP: 109/71  Pulse: 83  SpO2: 91%  Weight: 288 lb (130.6 kg)   Wt Readings from Last 3 Encounters:  02/20/24  288 lb (130.6 kg)  02/05/24 282 lb (127.9 kg)  01/15/24 252 lb 8 oz (114.5 kg)   Lab Results  Component Value Date   CREATININE 1.33 (H) 01/15/2024   CREATININE 1.54 (H) 11/03/2023   CREATININE 1.31 (H) 06/14/2023    Physical Exam: General: Well appearing obese male.  Cor: No JVD. Regular rhythm, rate.  Lungs: clear Abdomen: soft, nontender, nondistended. Extremities: no edema Neuro:. Affect pleasant   Assessment/Plan:  1.  Chronic HF with mid-range EF with prominent RV dysfunction: Significant RV dysfunction in the setting of OHS/OSA. Echo in 2/25 showed EF 45-50%, severe LVH, moderate RV enlargement and moderate RV dysfunction, PASP 44 mmHg. Cath in 5/22 showed mild nonobstructive coronary disease.  Severe LVH with mildly decreased EF and without marked hypertension concerns me for hypertrophic cardiomyopathy or Fabry's.  He also has a strong family history of CHF/cardiomyopathy.  Finally, he has lung nodules that were PET-positive so sarcoidosis would certainly be a consideration as well. NYHA class II, not volume overloaded today.  - cMRI to be done 03/13/24 to look for evidence of HCM, Fabry's, cardiac sarcoidosis.  - genetic testing done 11/03/23 was indeterminate. Could consider genetic counseling if patient is interested.  - Continue torsemide  40 mg daily.  BMET today - Continue spironolactone  25 mg daily.  - Continue Jardiance  10 mg daily.  - Continue Coreg  6.25 mg bid.  No BP room to increase.  - Continue Entresto  97/103 bid.  2. 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   - Not candidate for pulmonary vasodilators.  3. OHS/OSA: Suspect this contributes to RV failure. 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 Has ordered a new mask and trimmed his beard.    4. CKD stage 3: BMET today.  5. Polycythemia: Likely due to  chronic hypoxemia. Saw hematology, JAK2 mutation negative.  - Using CPAP with oxygen.  6. Atrial fibrillation: Paroxysmal.  No palpitations.   - Continue Xarelto .  7. Obesity: Currently using 2mg  semaglutide  but weight has gone up 7 pounds since last visit. He admits to overeating over the holiday and feels like he's back on track now. However, will see if we can transition to Mounjaro for better weight loss. He is agreeable if insurance covers it.   8. Lung nodules: Following with pulmonary, Discussion about bronchoscopy / biopsy vs serial CT's.  Prior smoker (quit 2022).  Consider possibility of sarcoidosis. cMRI to be done later this month.  9. Elevated LFTs: Normal abdominal US .  - LFT's 12/25 normal    Return in 1 month with HF MD after cMRI results, sooner if needed. If cMRI + sarcoid, will need f/u with HF pharmacist for meds.   I spent 39 minutes reviewing records, interviewing/ examing patient and managing plan/ orders.    Ellouise DELENA Class, FNP 02/19/2024  "

## 2024-02-20 ENCOUNTER — Encounter: Payer: Self-pay | Admitting: Family

## 2024-02-20 ENCOUNTER — Ambulatory Visit: Payer: Self-pay | Attending: Family | Admitting: Family

## 2024-02-20 ENCOUNTER — Telehealth (HOSPITAL_COMMUNITY): Payer: Self-pay

## 2024-02-20 ENCOUNTER — Other Ambulatory Visit (HOSPITAL_COMMUNITY): Payer: Self-pay

## 2024-02-20 ENCOUNTER — Other Ambulatory Visit: Payer: Self-pay

## 2024-02-20 VITALS — BP 109/71 | HR 83 | Wt 288.0 lb

## 2024-02-20 DIAGNOSIS — D751 Secondary polycythemia: Secondary | ICD-10-CM | POA: Diagnosis not present

## 2024-02-20 DIAGNOSIS — Z7984 Long term (current) use of oral hypoglycemic drugs: Secondary | ICD-10-CM | POA: Insufficient documentation

## 2024-02-20 DIAGNOSIS — I480 Paroxysmal atrial fibrillation: Secondary | ICD-10-CM | POA: Insufficient documentation

## 2024-02-20 DIAGNOSIS — Z7901 Long term (current) use of anticoagulants: Secondary | ICD-10-CM | POA: Insufficient documentation

## 2024-02-20 DIAGNOSIS — R7989 Other specified abnormal findings of blood chemistry: Secondary | ICD-10-CM | POA: Insufficient documentation

## 2024-02-20 DIAGNOSIS — Z79899 Other long term (current) drug therapy: Secondary | ICD-10-CM | POA: Diagnosis not present

## 2024-02-20 DIAGNOSIS — R911 Solitary pulmonary nodule: Secondary | ICD-10-CM

## 2024-02-20 DIAGNOSIS — I2510 Atherosclerotic heart disease of native coronary artery without angina pectoris: Secondary | ICD-10-CM | POA: Diagnosis not present

## 2024-02-20 DIAGNOSIS — Z7985 Long-term (current) use of injectable non-insulin antidiabetic drugs: Secondary | ICD-10-CM | POA: Diagnosis not present

## 2024-02-20 DIAGNOSIS — I130 Hypertensive heart and chronic kidney disease with heart failure and stage 1 through stage 4 chronic kidney disease, or unspecified chronic kidney disease: Secondary | ICD-10-CM | POA: Diagnosis not present

## 2024-02-20 DIAGNOSIS — Z9981 Dependence on supplemental oxygen: Secondary | ICD-10-CM | POA: Insufficient documentation

## 2024-02-20 DIAGNOSIS — I272 Pulmonary hypertension, unspecified: Secondary | ICD-10-CM | POA: Insufficient documentation

## 2024-02-20 DIAGNOSIS — N1831 Chronic kidney disease, stage 3a: Secondary | ICD-10-CM

## 2024-02-20 DIAGNOSIS — E662 Morbid (severe) obesity with alveolar hypoventilation: Secondary | ICD-10-CM | POA: Insufficient documentation

## 2024-02-20 DIAGNOSIS — N183 Chronic kidney disease, stage 3 unspecified: Secondary | ICD-10-CM | POA: Diagnosis not present

## 2024-02-20 DIAGNOSIS — Z87891 Personal history of nicotine dependence: Secondary | ICD-10-CM | POA: Insufficient documentation

## 2024-02-20 DIAGNOSIS — E1122 Type 2 diabetes mellitus with diabetic chronic kidney disease: Secondary | ICD-10-CM | POA: Diagnosis not present

## 2024-02-20 DIAGNOSIS — I2721 Secondary pulmonary arterial hypertension: Secondary | ICD-10-CM | POA: Diagnosis not present

## 2024-02-20 DIAGNOSIS — R918 Other nonspecific abnormal finding of lung field: Secondary | ICD-10-CM | POA: Insufficient documentation

## 2024-02-20 DIAGNOSIS — E66813 Obesity, class 3: Secondary | ICD-10-CM

## 2024-02-20 DIAGNOSIS — Z6841 Body Mass Index (BMI) 40.0 and over, adult: Secondary | ICD-10-CM

## 2024-02-20 DIAGNOSIS — G4733 Obstructive sleep apnea (adult) (pediatric): Secondary | ICD-10-CM

## 2024-02-20 DIAGNOSIS — I5042 Chronic combined systolic (congestive) and diastolic (congestive) heart failure: Secondary | ICD-10-CM | POA: Diagnosis present

## 2024-02-20 LAB — BASIC METABOLIC PANEL WITH GFR
BUN/Creatinine Ratio: 12 (ref 9–20)
BUN: 15 mg/dL (ref 6–24)
CO2: 22 mmol/L (ref 20–29)
Calcium: 9.6 mg/dL (ref 8.7–10.2)
Chloride: 102 mmol/L (ref 96–106)
Creatinine, Ser: 1.29 mg/dL — ABNORMAL HIGH (ref 0.76–1.27)
Glucose: 97 mg/dL (ref 70–99)
Potassium: 4 mmol/L (ref 3.5–5.2)
Sodium: 139 mmol/L (ref 134–144)
eGFR: 68 mL/min/1.73

## 2024-02-20 NOTE — Telephone Encounter (Signed)
 Advanced Heart Failure Patient Advocate Encounter  Attempted prior authorization for Mounjaro [facility]). This plan states that Byetta, Trulicity, Ozempic , Victoza are preferred. Patient would need to try and fail two of the preferred products for Mounjaro to be considered. Office staff will discuss with patient to determine if an alternate to Ozempic  will be prescribed.  Rachel DEL, CPhT Rx Patient Advocate Phone: [phone]

## 2024-02-20 NOTE — Patient Instructions (Signed)
 Medication Changes:  No medication changes today!  Lab Work:  Go downstairs to [facility] on [address] to have your blood work completed.  We will only call you if the results are abnormal or if the provider would like to make medication changes.  No news is good news.'    Testing/Procedures:  Please attend your upcoming cardiac mri 03/13/24   Follow-Up in: Please follow up with the Advanced Heart Failure Clinic in February with Dr. Rolan.   Thank you for choosing [facility] Advanced Heart Failure Clinic.    At the Advanced Heart Failure Clinic, you and your health needs are our priority. We have a designated team specialized in the treatment of Heart Failure. This Care Team includes your primary Heart Failure Specialized Cardiologist (physician), Advanced Practice Providers (APPs- Physician Assistants and Nurse Practitioners), and Pharmacist who all work together to provide you with the care you need, when you need it.   You may see any of the following providers on your designated Care Team at your next follow up:  Dr. Toribio Fuel Dr. Ezra Rolan Dr. Ria Commander Dr. Morene Brownie Ellouise Class, FNP Jaun Bash, RPH-CPP  Please be sure to bring in all your medications bottles to every appointment.   Need to Contact Us :  If you have any questions or concerns before your next appointment please send us  a message through [place] or call our office at [phone].    TO LEAVE A MESSAGE FOR THE NURSE SELECT OPTION 2, PLEASE LEAVE A MESSAGE INCLUDING: YOUR NAME DATE OF BIRTH CALL BACK NUMBER REASON FOR CALL**this is important as we prioritize the call backs  YOU WILL RECEIVE A CALL BACK THE SAME DAY AS LONG AS YOU CALL BEFORE 4:00 PM

## 2024-02-21 ENCOUNTER — Ambulatory Visit: Payer: Self-pay | Admitting: Family

## 2024-02-21 ENCOUNTER — Other Ambulatory Visit: Payer: Self-pay

## 2024-02-21 ENCOUNTER — Telehealth: Payer: Self-pay | Admitting: Family

## 2024-02-21 NOTE — Telephone Encounter (Signed)
 Patient is not losing weight on 2mg  ozempic  weekly. Discussed changing him to [facility] for improved weight loss if his insurance covers it. For mounjaro to be covered, he has to try AND fail 2 agents (one is ozempic ). He has the option of trying a different, less effective, agent so that the mounjaro can be approved or he can continue on ozempic  and work on diet / exercise. Once his sleep study is done, it may help get it approved.   Will forward this to B Sharl, RN for her to call to discuss this with patient. (If he wants to try a different agent, it would be byetta, trulicity or victoza.)

## 2024-02-27 ENCOUNTER — Other Ambulatory Visit: Payer: Self-pay

## 2024-02-27 MED ORDER — MOUNJARO 2.5 MG/0.5ML ~~LOC~~ SOAJ
2.5000 mg | SUBCUTANEOUS | 0 refills | Status: AC
  Filled 2024-02-27: qty 2, 28d supply, fill #0

## 2024-03-11 ENCOUNTER — Encounter (HOSPITAL_COMMUNITY): Payer: Self-pay

## 2024-03-13 ENCOUNTER — Other Ambulatory Visit: Payer: Self-pay | Admitting: Cardiology

## 2024-03-13 ENCOUNTER — Ambulatory Visit
Admission: RE | Admit: 2024-03-13 | Discharge: 2024-03-13 | Disposition: A | Discharge status: Home / Self Care | Payer: Self-pay | Source: Ambulatory Visit | Attending: Cardiology | Admitting: Cardiology

## 2024-03-13 DIAGNOSIS — I5042 Chronic combined systolic (congestive) and diastolic (congestive) heart failure: Secondary | ICD-10-CM

## 2024-03-13 DIAGNOSIS — I420 Dilated cardiomyopathy: Secondary | ICD-10-CM

## 2024-03-14 NOTE — Telephone Encounter (Signed)
 Called pt for second time. Voicemail box is full.

## 2024-03-19 ENCOUNTER — Telehealth: Payer: Self-pay | Admitting: Cardiology

## 2024-03-19 NOTE — Telephone Encounter (Signed)
 Called to confirm/remind patient of their appointment at the Advanced Heart Failure Clinic on 03/20/24.   Appointment:   [x] Confirmed  [] Left mess   [] No answer/No voice mail  [] VM Full/unable to leave message  [] Phone not in service  Patient reminded to bring all medications and/or complete list.  Confirmed patient has transportation. Gave directions, instructed to utilize valet parking.

## 2024-03-20 ENCOUNTER — Other Ambulatory Visit
Admission: RE | Admit: 2024-03-20 | Discharge: 2024-03-20 | Disposition: A | Source: Ambulatory Visit | Attending: Cardiology | Admitting: Cardiology

## 2024-03-20 ENCOUNTER — Other Ambulatory Visit: Payer: Self-pay

## 2024-03-20 ENCOUNTER — Ambulatory Visit (HOSPITAL_COMMUNITY): Payer: Self-pay | Admitting: Cardiology

## 2024-03-20 ENCOUNTER — Ambulatory Visit: Payer: Self-pay | Admitting: Cardiology

## 2024-03-20 ENCOUNTER — Encounter: Payer: Self-pay | Admitting: Cardiology

## 2024-03-20 VITALS — BP 120/76 | HR 92 | Wt 284.0 lb

## 2024-03-20 DIAGNOSIS — I420 Dilated cardiomyopathy: Secondary | ICD-10-CM | POA: Diagnosis not present

## 2024-03-20 DIAGNOSIS — I5042 Chronic combined systolic (congestive) and diastolic (congestive) heart failure: Secondary | ICD-10-CM

## 2024-03-20 LAB — BASIC METABOLIC PANEL WITH GFR
Anion gap: 13 (ref 5–15)
BUN: 15 mg/dL (ref 6–20)
CO2: 30 mmol/L (ref 22–32)
Calcium: 9.8 mg/dL (ref 8.9–10.3)
Chloride: 98 mmol/L (ref 98–111)
Creatinine, Ser: 1.47 mg/dL — ABNORMAL HIGH (ref 0.61–1.24)
GFR, Estimated: 58 mL/min — ABNORMAL LOW
Glucose, Bld: 115 mg/dL — ABNORMAL HIGH (ref 70–99)
Potassium: 3.5 mmol/L (ref 3.5–5.1)
Sodium: 141 mmol/L (ref 135–145)

## 2024-03-20 LAB — PRO BRAIN NATRIURETIC PEPTIDE: Pro Brain Natriuretic Peptide: 65.1 pg/mL

## 2024-03-20 MED ORDER — DIAZEPAM 2 MG PO TABS: 2.0000 mg | ORAL_TABLET | Freq: Once | ORAL | 0 refills | Status: AC

## 2024-03-20 MED ORDER — CARVEDILOL 12.5 MG PO TABS
12.5000 mg | ORAL_TABLET | Freq: Two times a day (BID) | ORAL | 1 refills | Status: AC
  Filled 2024-03-20: qty 180, 90d supply, fill #0

## 2024-03-20 NOTE — Progress Notes (Signed)
 "  Advanced Heart Failure Clinic Note    PCP: Alla Amis, MD  HF Cardiology: Dr. Rolan  Chief complaint: CHF  HPI: Gregory Mckenzie is a 49 y.o. male with history of CKD stage 3, type 2 diabetes, HTN, paroxysmal atrial fibrillation, OHS/OSA, and chronic systolic CHF with prominent RV dysfunction returns for followup of CHF. Patient had LHC in 2022 with mild nonobstructive CAD.  Patient was admitted in 2/25 with volume overload and hypoxemic respiratory failure.  He was intubated and diuresed.    He has seen pulmonology regarding lung nodules and mediastinal lymphadenopathy and there is a suspicion of him having sarcoid. Discussion of bronchoscopy with biopsy vs serial monitoring with CT's.   He returns today for followup of CHF. He is using CPAP nightly.  Weight is down 4 lbs.  He has not yet started tirzepatide . He denies exertional dyspnea or chest pain.  No lightheadedness.  No palpitations.  No orthopnea/PND.    ECG 11/03/23: NSR, LAFB, poor RWP  Labs (3/25): K 3.9, creatinine 1.18 Labs (4/25): hgb 17.6, AST 34, ALT 80, TGs 206, LDL 81, BNP 87, K 3.8, creatinine 1.39, AST 41, ALT 90 Labs (9/25): K 3.6, creatinine 1.54, BNP 11.1, Hg 17.3 Labs (12/25): K 3.2, creatinine 1.33, LFT's normal, Hg 14.8 Labs (1/26): K 4, creatinine 1.2, HIV negative, LDL 56  PMH: 1. CKD stage 3 2. Type 2 diabetes 3. HTN 4. Atrial fibrillation: Paroxysmal 5. OHS/OSA: Chronic hypoxemic respiratory failure on home oxygen (now using only at night). Trying to get CPAP.  6. 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  - Echo in 2/23: EF 40-45%.  - Echo (2/25): EF 45-50%, severe LVH, moderate RV enlargement with moderate RV dysfunction.  - LHC (5/22): Mild nonobstructive CAD.  - RHC (2/25): mean RA 8, PA 51/27, mean PCWP 19, CI 2.3 F/2.5 T.  PVR 2.4 WU, PAPi 3.   7. Hyperlipidemia 8. Polycythemia: Likely due to chronic hypoxemia - JAK2 mutation negative.  9. Elevated LFTs: Abdominal US  unremarkable in 8/25.  10. Mediastinal lymphadenopathy by CT chest, suspect sarcoid  FH: 2 uncles with CHF, Cousin died of CHF at 23, 3 other cousins with CHF.  No known sarcoidosis.   Social History   Socioeconomic History   Marital status: Single    Spouse name: Not on file   Number of children: Not on file   Years of education: Not on file   Highest education level: Not on file  Occupational History   Not on file  Tobacco Use   Smoking status: Former    Current packs/day: 0.00    Average packs/day: 1 pack/day for 26.9 years (26.9 ttl pk-yrs)    Types: Cigarettes    Start date: 04/06/1993    Quit date: 2022    Years since quitting: 4.0   Smokeless tobacco: Never  Vaping Use   Vaping status: Never Used  Substance and Sexual Activity   Alcohol  use: Not Currently   Drug use: Not Currently    Frequency: 7.0 times per week    Types: Marijuana    Comment: daily   Sexual activity: Yes    Partners: Female    Birth control/protection: None  Other Topics Concern   Not on file  Social History Narrative   Not on file   Social Drivers of Health   Tobacco Use: Medium Risk (  03/20/2024)   Patient History    Smoking Tobacco Use: Former    Smokeless Tobacco Use: Never    Passive Exposure: Not on file  Financial Resource Strain: Low Risk  (02/26/2024)   Received from [facility]   Overall Financial Resource Strain (CARDIA)    Difficulty of Paying Living Expenses: Not hard at all  Food Insecurity: Unknown (02/26/2024)   Received from [facility]   Epic    Within the past 12 months, you worried that your food would run out before you got the money to buy more.: Patient declined    Within the past 12 months, the food you bought just didn't last and you didn't have money to get more.: Never true  Transportation Needs: No  Transportation Needs (02/26/2024)   Received from [facility] - Transportation    In the past 12 months, has lack of transportation kept you from medical appointments or from getting medications?: No    Lack of Transportation (Non-Medical): No  Physical Activity: Not on file  Stress: Not on file  Social Connections: Not on file  Intimate Partner Violence: Not At Risk (05/30/2023)   Humiliation, Afraid, Rape, and Kick questionnaire    Fear of Current or Ex-Partner: No    Emotionally Abused: No    Physically Abused: No    Sexually Abused: No  Depression (PHQ2-9): Low Risk (05/30/2023)   Depression (PHQ2-9)    PHQ-2 Score: 0  Alcohol  Screen: Not on file  Housing: Unknown (02/26/2024)   Received from [facility]   Epic    In the last 12 months, was there a time when you were not able to pay the mortgage or rent on time?: No    Number of Times Moved in the Last Year: Not on file    At any time in the past 12 months, were you homeless or living in a shelter (including now)?: No  Utilities: Not At Risk (02/26/2024)   Received from [facility]   Epic    In the past 12 months has the electric, gas, oil, or water  company threatened to shut off services in your home?: No  Health Literacy: Not on file   ROS: All systems reviewed and negative except as per HPI.   Current Outpatient Medications  Medication Sig Dispense Refill   atorvastatin  (LIPITOR ) 40 MG tablet Take 2 tablets (80 mg total) by mouth at bedtime. 180 tablet 3   diazepam  (VALIUM ) 2 MG tablet Take 1 tablet (2 mg total) by mouth once for 1 dose. Take 30 min prior to mri 1 tablet 0   empagliflozin  (JARDIANCE ) 10 MG TABS tablet Take 1 tablet (10 mg total) by mouth daily before breakfast. 30 tablet 3   OXYGEN Inhale 4 L into the lungs at bedtime.     rivaroxaban  (XARELTO ) 20 MG TABS tablet Take 1 tablet (20 mg total) by mouth daily with supper. 90 tablet 3    sacubitril -valsartan  (ENTRESTO ) 97-103 MG Take 1 tablet by mouth 2 (two) times daily. 60 tablet 6   Semaglutide , 2 MG/DOSE, (OZEMPIC , 2 MG/DOSE,) 8 MG/3ML SOPN Inject 2 mg into the skin once a week. 3 mL 11   spironolactone  (ALDACTONE ) 25 MG tablet Take 1 tablet (25 mg total) by mouth once daily. 90 tablet 1   tirzepatide  (MOUNJARO ) 2.5 MG/0.5ML Pen Inject 2.5 mg into the skin once a week. 2 mL 0   torsemide  (DEMADEX ) 20  MG tablet Take 2 tablets (40 mg total) by mouth daily. 60 tablet 11   carvedilol  (COREG ) 12.5 MG tablet Take 1 tablet (12.5 mg total) by mouth 2 (two) times daily with a meal. 180 tablet 1   No current facility-administered medications for this visit.   Vitals:   03/20/24 1344  BP: 120/76  Pulse: 92  SpO2: 93%  Weight: 284 lb (128.8 kg)   Wt Readings from Last 3 Encounters:  03/20/24 284 lb (128.8 kg)  02/20/24 288 lb (130.6 kg)  02/05/24 282 lb (127.9 kg)   Lab Results  Component Value Date   CREATININE 1.47 (H) 03/20/2024   CREATININE 1.29 (H) 02/20/2024   CREATININE 1.33 (H) 01/15/2024   General: NAD Neck: Thick. No JVD, no thyromegaly or thyroid nodule.  Lungs: Clear to auscultation bilaterally with normal respiratory effort. CV: Nondisplaced PMI.  Heart regular S1/S2, no S3/S4, no murmur.  No peripheral edema.  No carotid bruit.  Normal pedal pulses.  Abdomen: Soft, nontender, no hepatosplenomegaly, no distention.  Skin: Intact without lesions or rashes.  Neurologic: Alert and oriented x 3.  Psych: Normal affect. Extremities: No clubbing or cyanosis.  HEENT: Normal.   Assessment/Plan:  1. Chronic HF with mid-range EF with prominent RV dysfunction: Significant RV dysfunction in the setting of OHS/OSA. Echo in 2/25 showed EF 45-50%, severe LVH, moderate RV enlargement and moderate RV dysfunction, PASP 44 mmHg. Cath in 5/22 showed mild nonobstructive coronary disease.  Severe LVH with mildly decreased EF and without marked hypertension concerns me for  hypertrophic cardiomyopathy or Fabry's.  He also has a strong family history of CHF/cardiomyopathy.  Finally, he has lung nodules that were PET-positive as well as mediastinal adenopathy so sarcoidosis would certainly be a consideration as well. 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 NYHA class I-II, he is not volume overloaded on exam.  - Unable to complete cardiac MRI due to claustrophobia.  We are going to try again with Valium .  Need MRI to look for evidence of HCM, Fabry's, cardiac sarcoidosis.  - Refer to Dr. Danford Pac for genetic evaluation given ALPK3 gene mutation of uncertain significance (could this be associated with HCM?).  - Continue torsemide  40 mg daily.  BMET today - Continue spironolactone  25 mg daily.  Check BMET, BNP.  - Continue Jardiance  10 mg daily.  - Increase Coreg  to 12.5 mg bid.   - Continue Entresto  97/103 bid.  - Check ACE level.  - Depending on result of cardiac MRI, may need bronchoscopy with biopsy of hilar nodes to assess for sarcoidosis.  2. 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   - Not candidate for pulmonary vasodilators.  3. OHS/OSA: Suspect this contributes to RV failure. He is now using CPAP.  4. CKD stage 3: BMET today.  5. Polycythemia: Likely due to chronic hypoxemia. Saw hematology, JAK2 mutation negative.  - Using CPAP with oxygen.  6. Atrial fibrillation: Paroxysmal.  NSR today.  - Continue Xarelto .  7. Obesity: He will be starting tirzepatide .  8. Lung nodules with mediastinal adenopathy: Following with pulmonary, Discussion about bronchoscopy / biopsy vs serial CT's => would favor bronch/biopsy for definitive diagnosis.  Prior smoker (quit 2022).  Consider possibility of sarcoidosis. cMRI to be  rescheduled to assess for possible cardiac sarcoidosis.  Followup in 6 wks.   I spent 32 minutes reviewing records, interviewing/ examing patient and managing plan/ orders.   Gregory Shuck, MD 03/20/24  "

## 2024-03-20 NOTE — Patient Instructions (Signed)
 Medication Changes:  INCREASE Carvedilol  to 12.5mg  (1 tab) two times daily  Lab Work:  Go over to the [facility]. Go pass the gift shop and have your blood work completed.  We will only call you if the results are abnormal or if the provider would like to make medication changes.  No news is good news.   Referrals:  You have been referred to Dr. Danford Pac for [facility] counseling. Her office will contact you in order to schedule an appointment.  Follow-Up in: Please follow up with the Advanced Heart Failure Clinic in 6 weeks with Ellouise Class, FNP.   Thank you for choosing [facility] Advanced Heart Failure Clinic.    At the Advanced Heart Failure Clinic, you and your health needs are our priority. We have a designated team specialized in the treatment of Heart Failure. This Care Team includes your primary Heart Failure Specialized Cardiologist (physician), Advanced Practice Providers (APPs- Physician Assistants and Nurse Practitioners), and Pharmacist who all work together to provide you with the care you need, when you need it.   You may see any of the following providers on your designated Care Team at your next follow up:  Dr. Toribio Fuel Dr. Ezra Shuck Dr. Ria Commander Dr. Morene Brownie Ellouise Class, FNP Jaun Bash, RPH-CPP  Please be sure to bring in all your medications bottles to every appointment.   Need to Contact Us :  If you have any questions or concerns before your next appointment please send us  a message through [place] or call our office at [phone].    TO LEAVE A MESSAGE FOR THE NURSE SELECT OPTION 2, PLEASE LEAVE A MESSAGE INCLUDING: YOUR NAME DATE OF BIRTH CALL BACK NUMBER REASON FOR CALL**this is important as we prioritize the call backs  YOU WILL RECEIVE A CALL BACK THE SAME DAY AS LONG AS YOU CALL BEFORE 4:00 PM b

## 2024-03-21 LAB — ANGIOTENSIN CONVERTING ENZYME: Angiotensin-Converting Enzyme: 47 U/L (ref 14–82)

## 2024-04-10 ENCOUNTER — Other Ambulatory Visit

## 2024-04-15 ENCOUNTER — Inpatient Hospital Stay

## 2024-04-29 ENCOUNTER — Inpatient Hospital Stay: Attending: Oncology | Admitting: Genetic Counselor

## 2024-05-01 ENCOUNTER — Ambulatory Visit: Admitting: Family

## 2024-07-03 ENCOUNTER — Other Ambulatory Visit

## 2024-07-15 ENCOUNTER — Inpatient Hospital Stay

## 2024-07-15 ENCOUNTER — Inpatient Hospital Stay: Admitting: Oncology
